# Patient Record
Sex: Female | Born: 1970
Health system: Southern US, Community
[De-identification: ages and names within clinical notes are randomized; demographics above are authoritative.]

## PROBLEM LIST (undated history)

## (undated) DIAGNOSIS — G5600 Carpal tunnel syndrome, unspecified upper limb: Secondary | ICD-10-CM

## (undated) DIAGNOSIS — R7611 Nonspecific reaction to tuberculin skin test without active tuberculosis: Secondary | ICD-10-CM

## (undated) DIAGNOSIS — F419 Anxiety disorder, unspecified: Secondary | ICD-10-CM

## (undated) DIAGNOSIS — E0500 Thyrotoxicosis with diffuse goiter without thyrotoxic crisis or storm: Secondary | ICD-10-CM

## (undated) DIAGNOSIS — I10 Essential (primary) hypertension: Secondary | ICD-10-CM

## (undated) DIAGNOSIS — M5430 Sciatica, unspecified side: Secondary | ICD-10-CM

## (undated) DIAGNOSIS — K219 Gastro-esophageal reflux disease without esophagitis: Secondary | ICD-10-CM

## (undated) DIAGNOSIS — E7800 Pure hypercholesterolemia, unspecified: Secondary | ICD-10-CM

## (undated) DIAGNOSIS — K224 Dyskinesia of esophagus: Secondary | ICD-10-CM

## (undated) DIAGNOSIS — R1310 Dysphagia, unspecified: Secondary | ICD-10-CM

## (undated) DIAGNOSIS — G43909 Migraine, unspecified, not intractable, without status migrainosus: Secondary | ICD-10-CM

## (undated) DIAGNOSIS — J309 Allergic rhinitis, unspecified: Secondary | ICD-10-CM

## (undated) DIAGNOSIS — M069 Rheumatoid arthritis, unspecified: Secondary | ICD-10-CM

## (undated) DIAGNOSIS — Z8719 Personal history of other diseases of the digestive system: Secondary | ICD-10-CM

## (undated) DIAGNOSIS — D649 Anemia, unspecified: Secondary | ICD-10-CM

## (undated) DIAGNOSIS — M1990 Unspecified osteoarthritis, unspecified site: Secondary | ICD-10-CM

## (undated) DIAGNOSIS — G4730 Sleep apnea, unspecified: Secondary | ICD-10-CM

## (undated) HISTORY — DX: Migraine, unspecified, not intractable, without status migrainosus: G43.909

## (undated) HISTORY — PX: CARPAL TUNNEL RELEASE: SHX101

## (undated) HISTORY — DX: Gastro-esophageal reflux disease without esophagitis: K21.9

## (undated) HISTORY — DX: Unspecified osteoarthritis, unspecified site: M19.90

## (undated) HISTORY — PX: TRIGGER FINGER RELEASE: SHX641

## (undated) HISTORY — DX: Nonspecific reaction to tuberculin skin test without active tuberculosis: R76.11

## (undated) HISTORY — DX: Thyrotoxicosis with diffuse goiter without thyrotoxic crisis or storm: E05.00

## (undated) HISTORY — DX: Carpal tunnel syndrome, unspecified upper limb: G56.00

## (undated) HISTORY — DX: Dysphagia, unspecified: R13.10

## (undated) HISTORY — DX: Pure hypercholesterolemia, unspecified: E78.00

## (undated) HISTORY — DX: Essential (primary) hypertension: I10

## (undated) HISTORY — DX: Rheumatoid arthritis, unspecified: M06.9

## (undated) HISTORY — PX: COLONOSCOPY: SHX174

## (undated) HISTORY — PX: ESOPHAGOGASTRODUODENOSCOPY: SHX1529

## (undated) HISTORY — DX: Allergic rhinitis, unspecified: J30.9

## (undated) HISTORY — DX: Anemia, unspecified: D64.9

---

## 1998-03-08 ENCOUNTER — Emergency Department (HOSPITAL_COMMUNITY): Admission: EM | Admit: 1998-03-08 | Discharge: 1998-03-08 | Payer: Self-pay | Admitting: Emergency Medicine

## 2004-10-16 ENCOUNTER — Ambulatory Visit: Payer: Self-pay | Admitting: Internal Medicine

## 2005-06-30 HISTORY — PX: OTHER SURGICAL HISTORY: SHX169

## 2006-02-09 ENCOUNTER — Ambulatory Visit: Payer: Self-pay | Admitting: Unknown Physician Specialty

## 2006-07-26 ENCOUNTER — Emergency Department: Payer: Self-pay | Admitting: Emergency Medicine

## 2006-07-27 ENCOUNTER — Emergency Department: Payer: Self-pay | Admitting: Emergency Medicine

## 2007-03-30 ENCOUNTER — Ambulatory Visit: Payer: Self-pay | Admitting: Internal Medicine

## 2007-05-18 ENCOUNTER — Ambulatory Visit: Payer: Self-pay | Admitting: Internal Medicine

## 2007-08-16 ENCOUNTER — Ambulatory Visit: Payer: Self-pay | Admitting: Unknown Physician Specialty

## 2007-08-16 LAB — HM COLONOSCOPY

## 2008-04-05 ENCOUNTER — Ambulatory Visit: Payer: Self-pay | Admitting: Internal Medicine

## 2008-06-06 ENCOUNTER — Ambulatory Visit: Payer: Self-pay | Admitting: Internal Medicine

## 2008-10-09 ENCOUNTER — Ambulatory Visit: Payer: Self-pay | Admitting: Internal Medicine

## 2009-04-09 ENCOUNTER — Ambulatory Visit: Payer: Self-pay | Admitting: Internal Medicine

## 2009-04-26 ENCOUNTER — Ambulatory Visit: Payer: Self-pay | Admitting: Internal Medicine

## 2009-06-07 ENCOUNTER — Ambulatory Visit: Payer: Self-pay | Admitting: Internal Medicine

## 2010-08-12 ENCOUNTER — Ambulatory Visit: Payer: Self-pay | Admitting: Internal Medicine

## 2010-08-19 ENCOUNTER — Ambulatory Visit: Payer: Self-pay | Admitting: Internal Medicine

## 2011-08-04 LAB — CBC AND DIFFERENTIAL
HCT: 37 % (ref 36–46)
Hemoglobin: 12.3 g/dL (ref 12.0–16.0)
Platelets: 332 10*3/uL (ref 150–399)
WBC: 8.6 10^3/mL

## 2011-08-04 LAB — HM PAP SMEAR

## 2011-08-04 LAB — HEPATIC FUNCTION PANEL: ALT: 18 U/L (ref 7–35)

## 2011-08-14 LAB — LIPID PANEL
Cholesterol: 208 mg/dL — AB (ref 0–200)
HDL: 50 mg/dL (ref 35–70)
LDL Cholesterol: 143 mg/dL
Triglycerides: 79 mg/dL (ref 40–160)

## 2011-08-14 LAB — HEPATIC FUNCTION PANEL
AST: 17 U/L (ref 13–35)
Alkaline Phosphatase: 97 U/L (ref 25–125)
Bilirubin, Total: 0.4 mg/dL

## 2011-08-14 LAB — BASIC METABOLIC PANEL
BUN: 8 mg/dL (ref 4–21)
Creatinine: 0.8 mg/dL (ref <=1.1)
Glucose: 83 mg/dL
Potassium: 4.6 mmol/L (ref 3.4–5.3)

## 2011-10-03 ENCOUNTER — Ambulatory Visit: Payer: Self-pay | Admitting: Internal Medicine

## 2011-10-03 LAB — HM MAMMOGRAPHY

## 2011-10-06 ENCOUNTER — Ambulatory Visit: Payer: Self-pay | Admitting: Internal Medicine

## 2012-04-27 ENCOUNTER — Telehealth: Payer: Self-pay | Admitting: Internal Medicine

## 2012-04-27 DIAGNOSIS — Z0000 Encounter for general adult medical examination without abnormal findings: Secondary | ICD-10-CM

## 2012-04-27 NOTE — Telephone Encounter (Signed)
She can start getting her depo injections here.  She will need to bring her med with her.  She will also need a urine pregnancy test done before getting the injection.  I will order.  This will be done the day she comes in.  Her last injection at [facility] was 01/14/12.

## 2012-04-27 NOTE — Telephone Encounter (Signed)
Pt. Called stating it was time for her depo shot. Is it ok to do it here? Pt. Has appt here on 08/02/12.

## 2012-04-28 NOTE — Telephone Encounter (Signed)
Patient stated that she is overdue and would like to get an appointment for whenever you can fit her in.

## 2012-04-28 NOTE — Telephone Encounter (Signed)
Called pt and scheduled her for this coming Tuesday. I wasn't sure if she should come in now or if you ment wait until her first appointment on 08/02/12

## 2012-04-28 NOTE — Telephone Encounter (Signed)
That is fine to come in Tuesday - for injection.  She will need the urine pregnancy test prior to receiving the injection.  Can be run stat when she gets here and once confirm negative - can give injection.  If any questions let me know.

## 2012-04-28 NOTE — Telephone Encounter (Signed)
Called patient to make sure she had her appointment and information for her next visit.

## 2012-05-04 ENCOUNTER — Ambulatory Visit (INDEPENDENT_AMBULATORY_CARE_PROVIDER_SITE_OTHER): Payer: 59 | Admitting: *Deleted

## 2012-05-04 DIAGNOSIS — Z309 Encounter for contraceptive management, unspecified: Secondary | ICD-10-CM

## 2012-05-04 LAB — POCT URINE PREGNANCY: Preg Test, Ur: NEGATIVE

## 2012-05-04 MED ORDER — MEDROXYPROGESTERONE ACETATE 150 MG/ML IM SUSP
150.0000 mg | Freq: Once | INTRAMUSCULAR | Status: AC
  Administered 2012-05-04: 150 mg via INTRAMUSCULAR

## 2012-06-21 ENCOUNTER — Other Ambulatory Visit: Payer: Self-pay | Admitting: Internal Medicine

## 2012-06-21 MED ORDER — LEVOTHYROXINE SODIUM 75 MCG PO TABS: 75.0000 ug | ORAL_TABLET | Freq: Every day | ORAL | Status: DC

## 2012-06-21 NOTE — Telephone Encounter (Signed)
Levothyroxine 75 mg tablet  Take 1 tablet by mouth once daily  #30

## 2012-06-21 NOTE — Telephone Encounter (Signed)
Sent in to pharmacy.  

## 2012-07-30 ENCOUNTER — Encounter: Payer: Self-pay | Admitting: *Deleted

## 2012-08-02 ENCOUNTER — Telehealth: Payer: Self-pay | Admitting: Internal Medicine

## 2012-08-02 ENCOUNTER — Ambulatory Visit: Payer: Self-pay | Admitting: Internal Medicine

## 2012-08-02 NOTE — Telephone Encounter (Signed)
Okay 

## 2012-08-02 NOTE — Telephone Encounter (Signed)
Patient had to be rescheduled due to the physician being out of the off. Patient is needing her depo injection.

## 2012-08-03 ENCOUNTER — Ambulatory Visit (INDEPENDENT_AMBULATORY_CARE_PROVIDER_SITE_OTHER): Payer: 59 | Admitting: *Deleted

## 2012-08-03 DIAGNOSIS — Z309 Encounter for contraceptive management, unspecified: Secondary | ICD-10-CM

## 2012-08-03 DIAGNOSIS — IMO0001 Reserved for inherently not codable concepts without codable children: Secondary | ICD-10-CM

## 2012-08-03 MED ORDER — METHYLPREDNISOLONE ACETATE 40 MG/ML IJ SUSP
150.0000 mg | Freq: Once | INTRAMUSCULAR | Status: AC
  Administered 2012-08-03: 150 mg via INTRAMUSCULAR

## 2012-08-04 ENCOUNTER — Telehealth: Payer: Self-pay | Admitting: Internal Medicine

## 2012-08-04 NOTE — Telephone Encounter (Signed)
Yes, this should have been Depo- provera.  Thanks.

## 2012-08-04 NOTE — Telephone Encounter (Signed)
Message copied by Charm Barges on Wed Aug 04, 2012 10:15 AM ------      Message from: Zara Council      Created: Wed Aug 04, 2012  8:48 AM      Regarding: Depo Injection       Hi Dr. Lorin Picket; before I sent this charge out I wanted to check with you. For date of service 08/03/2012 the medical record shows the patient coming in for birth control and J1030 Depo-Medrol was selected. Should this have been J1050 Depo-Provera instead? Thanks.

## 2012-09-07 ENCOUNTER — Other Ambulatory Visit (HOSPITAL_COMMUNITY)
Admission: RE | Admit: 2012-09-07 | Discharge: 2012-09-07 | Disposition: A | Discharge status: Home / Self Care | Payer: 59 | Source: Ambulatory Visit | Attending: Internal Medicine | Admitting: Internal Medicine

## 2012-09-07 ENCOUNTER — Encounter: Payer: Self-pay | Admitting: Internal Medicine

## 2012-09-07 ENCOUNTER — Ambulatory Visit (INDEPENDENT_AMBULATORY_CARE_PROVIDER_SITE_OTHER): Payer: 59 | Admitting: Internal Medicine

## 2012-09-07 VITALS — BP 130/80 | HR 78 | Temp 99.0°F | Ht 62.0 in | Wt 159.2 lb

## 2012-09-07 DIAGNOSIS — Z124 Encounter for screening for malignant neoplasm of cervix: Secondary | ICD-10-CM

## 2012-09-07 DIAGNOSIS — Z01419 Encounter for gynecological examination (general) (routine) without abnormal findings: Secondary | ICD-10-CM | POA: Insufficient documentation

## 2012-09-07 DIAGNOSIS — E7800 Pure hypercholesterolemia, unspecified: Secondary | ICD-10-CM

## 2012-09-07 DIAGNOSIS — E039 Hypothyroidism, unspecified: Secondary | ICD-10-CM

## 2012-09-07 DIAGNOSIS — K219 Gastro-esophageal reflux disease without esophagitis: Secondary | ICD-10-CM

## 2012-09-07 DIAGNOSIS — Z800 Family history of malignant neoplasm of digestive organs: Secondary | ICD-10-CM

## 2012-09-07 DIAGNOSIS — I10 Essential (primary) hypertension: Secondary | ICD-10-CM

## 2012-09-07 DIAGNOSIS — I714 Abdominal aortic aneurysm, without rupture, unspecified: Secondary | ICD-10-CM

## 2012-09-07 DIAGNOSIS — Z1211 Encounter for screening for malignant neoplasm of colon: Secondary | ICD-10-CM

## 2012-09-07 DIAGNOSIS — Z1151 Encounter for screening for human papillomavirus (HPV): Secondary | ICD-10-CM | POA: Insufficient documentation

## 2012-09-07 DIAGNOSIS — D649 Anemia, unspecified: Secondary | ICD-10-CM

## 2012-09-07 DIAGNOSIS — Z1239 Encounter for other screening for malignant neoplasm of breast: Secondary | ICD-10-CM

## 2012-09-07 DIAGNOSIS — M069 Rheumatoid arthritis, unspecified: Secondary | ICD-10-CM

## 2012-09-08 ENCOUNTER — Encounter: Payer: Self-pay | Admitting: Internal Medicine

## 2012-09-10 ENCOUNTER — Encounter: Payer: Self-pay | Admitting: Internal Medicine

## 2012-09-13 ENCOUNTER — Other Ambulatory Visit: Payer: 59

## 2012-09-13 ENCOUNTER — Other Ambulatory Visit (INDEPENDENT_AMBULATORY_CARE_PROVIDER_SITE_OTHER): Payer: 59

## 2012-09-13 DIAGNOSIS — E7800 Pure hypercholesterolemia, unspecified: Secondary | ICD-10-CM

## 2012-09-13 DIAGNOSIS — E039 Hypothyroidism, unspecified: Secondary | ICD-10-CM

## 2012-09-13 LAB — LIPID PANEL
Cholesterol: 190 mg/dL (ref 0–200)
HDL: 43.8 mg/dL (ref >39.00)
LDL Cholesterol: 134 mg/dL — ABNORMAL HIGH (ref 0–99)
Total CHOL/HDL Ratio: 4
Triglycerides: 60 mg/dL (ref 0.0–149.0)
VLDL: 12 mg/dL (ref 0.0–40.0)

## 2012-09-13 LAB — TSH: TSH: 2.69 u[IU]/mL (ref 0.35–5.50)

## 2012-09-15 ENCOUNTER — Encounter: Payer: Self-pay | Admitting: Internal Medicine

## 2012-09-15 DIAGNOSIS — I10 Essential (primary) hypertension: Secondary | ICD-10-CM | POA: Insufficient documentation

## 2012-09-15 DIAGNOSIS — K219 Gastro-esophageal reflux disease without esophagitis: Secondary | ICD-10-CM | POA: Insufficient documentation

## 2012-09-15 DIAGNOSIS — Z800 Family history of malignant neoplasm of digestive organs: Secondary | ICD-10-CM | POA: Insufficient documentation

## 2012-09-15 DIAGNOSIS — E039 Hypothyroidism, unspecified: Secondary | ICD-10-CM | POA: Insufficient documentation

## 2012-09-15 DIAGNOSIS — E7800 Pure hypercholesterolemia, unspecified: Secondary | ICD-10-CM | POA: Insufficient documentation

## 2012-09-15 DIAGNOSIS — M069 Rheumatoid arthritis, unspecified: Secondary | ICD-10-CM | POA: Insufficient documentation

## 2012-09-15 DIAGNOSIS — D649 Anemia, unspecified: Secondary | ICD-10-CM | POA: Insufficient documentation

## 2012-09-15 NOTE — Assessment & Plan Note (Signed)
On thyroid replacement.  Check tsh.  

## 2012-09-15 NOTE — Assessment & Plan Note (Signed)
Had wanted to postpone EGD when last evaluated by GI.  Since she is due for a colonoscopy, she is ready to have the EGD as well.  Refer to GI.  Continue Zantac.

## 2012-09-15 NOTE — Assessment & Plan Note (Signed)
Blood pressure is under good control.  Continue same medication regimen.  Check metabolic panel.

## 2012-09-15 NOTE — Assessment & Plan Note (Signed)
Last colonoscopy 2009 - internal hemorrhoids.  Recommended follow up colonoscopy 07/2012.  Due. Refer to GI.

## 2012-09-15 NOTE — Assessment & Plan Note (Signed)
Seeing Dr Gavin Potters.  On Embrel, MTX and prednisone.  Follow.  Stable.

## 2012-09-15 NOTE — Progress Notes (Signed)
Subjective:    Patient ID: Tina Parker, female    DOB: [birth year], 42 y.o.   MRN: [id number]  HPI 42 year old female with past history of hypertension, hypercholesterolemia, GERD and reoccurring allergy and sinus problems.  Recently diagnosed with rheumatoid arthritis.  Followed by Dr Gavin Potters.  She comes in today to follow up on these issues as well as for a complete physical exam.  States she is doing relatively well.   Receiving Embrel now.  On MTX.  Still taking prednisone.  Joints stable.  Some problems with hemorrhoids.  Upper symptoms are better.  Takes zantac.  Had seen GI and they wanted to do an EGD at that time.  She was due a colonoscopy 2/14 and she wanted to wait and have both procedures at the same time.  She elected to postpone the EGD.  No nausea or vomiting.  No abdominal pain.  Breathing stable.     Past Medical History  Diagnosis Date  . Dysphagia   . Pure hypercholesterolemia   . Anemia   . Hypertension   . Graves disease     remission, no ablation, positive medical treatment  . Allergic rhinitis   . 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 Migraine headache   . Carpal tunnel syndrome   . Rheumatoid arthritis     positive anti CCP antibodies, positive RF, oligo-articular, MTX  . GERD (gastroesophageal reflux disease)     Current Outpatient Prescriptions on File Prior to Visit  Medication Sig Dispense Refill  . amLODipine (NORVASC) 10 MG tablet Take 10 mg by mouth daily.      Marland Kitchen azelastine (ASTELIN) 137 MCG/SPRAY nasal spray Place 1 spray into the nose as directed. Use in each nostril as directed      . fexofenadine (ALLEGRA) 180 MG tablet Take 180 mg by mouth daily.      . fluticasone (FLOVENT HFA) 110 MCG/ACT inhaler Inhale 2 puffs into the lungs 2 (two) times daily.      . folic acid (FOLVITE) 1 MG tablet Take 1 mg by mouth daily.      Marland Kitchen ibuprofen (ADVIL,MOTRIN) 800 MG tablet Take 800 mg by mouth 3 (three) times daily as needed. TID PRN for pain      .  levothyroxine (SYNTHROID, LEVOTHROID) 75 MCG tablet Take 1 tablet (75 mcg total) by mouth daily.  30 tablet  1  . losartan-hydrochlorothiazide (HYZAAR) 50-12.5 MG per tablet Take 1 tablet by mouth daily.      . medroxyPROGESTERone (DEPO-PROVERA) 150 MG/ML injection Inject 150 mg into the muscle every 3 (three) months.      . methotrexate (RHEUMATREX) 2.5 MG tablet Take 2.5 mg by mouth once a week. Caution:Chemotherapy. Protect from light. Take 8 tabs po weekly with meals      . montelukast (SINGULAIR) 10 MG tablet Take 10 mg by mouth daily.      . ranitidine (ZANTAC) 150 MG tablet Take 150 mg by mouth 2 (two) times daily.      . cyclobenzaprine (FLEXERIL) 5 MG tablet Take 5 mg by mouth 3 (three) times daily as needed.       No current facility-administered medications on file prior to visit.    Review of Systems Patient denies any headache, lightheadedness or dizziness.  Some sinus and allergy issues intermittently.  On medication.   No chest pain, tightness or palpitations.  No increased shortness of breath, cough or congestion. Takes zantac for acid reflux and her  upper symptoms.  Appears to be swallowing better.  No nausea or vomiting.  No abdominal pain or cramping.  No bowel change, such as diarrhea, constipation, BRBPR or melana.  No urine change.  Receives Depo.      Objective:   Physical Exam Filed Vitals:   09/07/12 1339  BP: 130/80  Pulse: 78  Temp: 99 F (37.2 C)   Blood pressure recheck:  126/76, pulse 29  42 year old female in no acute distress.   HEENT:  Nares- clear.  Oropharynx - without lesions. NECK:  Supple.  Nontender.  No audible bruit.  HEART:  Appears to be regular. LUNGS:  No crackles or wheezing audible.  Respirations even and unlabored.  RADIAL PULSE:  Equal bilaterally.    BREASTS:  No nipple discharge or nipple retraction present.  Could not appreciate any distinct nodules or axillary adenopathy.  ABDOMEN:  Soft, nontender.  Bowel sounds present and  normal.  No audible abdominal bruit.  GU:  Normal external genitalia.  Vaginal vault without lesions.  Cervix identified.  Pap performed. Could not appreciate any adnexal masses or tenderness.   RECTAL:  Heme negative.   EXTREMITIES:  No increased edema present.  DP pulses palpable and equal bilaterally.          Assessment & Plan:  CARDIOVASCULAR.  ECHO 01/27/11 revealed EF 60%, mild mitral and tricuspid insufficiency.  Currently asymptomatic.    GYN.  Depo.  Follow.    HEALTH MAINTENANCE.  Physical today.  Mammogram 10/03/11 - BiRADS II.  Colonoscopy 08/16/07 - internal hemorrhoids.  Recommended follow up colonoscopy 07/2012.

## 2012-09-15 NOTE — Assessment & Plan Note (Signed)
Low cholesterol diet and exercise.  Check lipid panel.   

## 2012-09-15 NOTE — Assessment & Plan Note (Signed)
Check cbc to confirm stable.   

## 2012-10-11 ENCOUNTER — Ambulatory Visit: Payer: Self-pay | Admitting: Internal Medicine

## 2012-10-19 ENCOUNTER — Ambulatory Visit (INDEPENDENT_AMBULATORY_CARE_PROVIDER_SITE_OTHER): Payer: 59 | Admitting: *Deleted

## 2012-10-19 DIAGNOSIS — IMO0001 Reserved for inherently not codable concepts without codable children: Secondary | ICD-10-CM

## 2012-10-19 DIAGNOSIS — Z309 Encounter for contraceptive management, unspecified: Secondary | ICD-10-CM

## 2012-10-19 MED ORDER — MEDROXYPROGESTERONE ACETATE 150 MG/ML IM SUSP
150.0000 mg | Freq: Once | INTRAMUSCULAR | Status: AC
  Administered 2012-10-19: 150 mg via INTRAMUSCULAR

## 2012-10-20 ENCOUNTER — Other Ambulatory Visit: Payer: Self-pay | Admitting: *Deleted

## 2012-10-21 MED ORDER — AMLODIPINE BESYLATE 10 MG PO TABS: 10.0000 mg | ORAL_TABLET | Freq: Every day | ORAL | Status: DC

## 2012-11-05 ENCOUNTER — Encounter: Payer: Self-pay | Admitting: Internal Medicine

## 2013-01-11 ENCOUNTER — Ambulatory Visit (INDEPENDENT_AMBULATORY_CARE_PROVIDER_SITE_OTHER): Payer: 59 | Admitting: *Deleted

## 2013-01-11 ENCOUNTER — Telehealth: Payer: Self-pay | Admitting: *Deleted

## 2013-01-11 DIAGNOSIS — Z309 Encounter for contraceptive management, unspecified: Secondary | ICD-10-CM

## 2013-01-11 MED ORDER — MEDROXYPROGESTERONE ACETATE 150 MG/ML IM SUSP
150.0000 mg | Freq: Once | INTRAMUSCULAR | Status: AC
  Administered 2013-01-11: 150 mg via INTRAMUSCULAR

## 2013-01-11 NOTE — Telephone Encounter (Signed)
Dr. Cyndie Mull office requesting last ov notes. Notes sent.

## 2013-01-17 ENCOUNTER — Ambulatory Visit: Payer: 59 | Admitting: Internal Medicine

## 2013-02-14 ENCOUNTER — Other Ambulatory Visit: Payer: Self-pay | Admitting: *Deleted

## 2013-02-15 MED ORDER — LOSARTAN POTASSIUM-HCTZ 50-12.5 MG PO TABS: 1.0000 | ORAL_TABLET | Freq: Every day | ORAL | Status: DC

## 2013-03-07 ENCOUNTER — Ambulatory Visit: Payer: Self-pay | Admitting: Unknown Physician Specialty

## 2013-03-08 LAB — PATHOLOGY REPORT

## 2013-03-18 ENCOUNTER — Encounter: Payer: Self-pay | Admitting: Internal Medicine

## 2013-03-18 DIAGNOSIS — K219 Gastro-esophageal reflux disease without esophagitis: Secondary | ICD-10-CM

## 2013-03-18 DIAGNOSIS — Z800 Family history of malignant neoplasm of digestive organs: Secondary | ICD-10-CM

## 2013-04-11 ENCOUNTER — Ambulatory Visit (INDEPENDENT_AMBULATORY_CARE_PROVIDER_SITE_OTHER): Payer: 59 | Admitting: *Deleted

## 2013-04-11 DIAGNOSIS — Z309 Encounter for contraceptive management, unspecified: Secondary | ICD-10-CM

## 2013-04-11 DIAGNOSIS — Z23 Encounter for immunization: Secondary | ICD-10-CM

## 2013-04-11 MED ORDER — MEDROXYPROGESTERONE ACETATE 150 MG/ML IM SUSP
150.0000 mg | Freq: Once | INTRAMUSCULAR | Status: AC
  Administered 2013-04-11: 150 mg via INTRAMUSCULAR

## 2013-04-12 ENCOUNTER — Encounter: Payer: Self-pay | Admitting: Internal Medicine

## 2013-04-13 ENCOUNTER — Encounter: Payer: Self-pay | Admitting: Internal Medicine

## 2013-04-26 ENCOUNTER — Ambulatory Visit (INDEPENDENT_AMBULATORY_CARE_PROVIDER_SITE_OTHER): Payer: 59 | Admitting: Internal Medicine

## 2013-04-26 ENCOUNTER — Encounter: Payer: Self-pay | Admitting: Internal Medicine

## 2013-04-26 VITALS — BP 110/80 | HR 91 | Temp 98.3°F | Ht 62.0 in | Wt 170.2 lb

## 2013-04-26 DIAGNOSIS — D649 Anemia, unspecified: Secondary | ICD-10-CM

## 2013-04-26 DIAGNOSIS — K219 Gastro-esophageal reflux disease without esophagitis: Secondary | ICD-10-CM

## 2013-04-26 DIAGNOSIS — M069 Rheumatoid arthritis, unspecified: Secondary | ICD-10-CM

## 2013-04-26 DIAGNOSIS — I10 Essential (primary) hypertension: Secondary | ICD-10-CM

## 2013-04-26 DIAGNOSIS — Z800 Family history of malignant neoplasm of digestive organs: Secondary | ICD-10-CM

## 2013-04-26 DIAGNOSIS — E7800 Pure hypercholesterolemia, unspecified: Secondary | ICD-10-CM

## 2013-04-26 DIAGNOSIS — E039 Hypothyroidism, unspecified: Secondary | ICD-10-CM

## 2013-04-26 LAB — LIPID PANEL
Cholesterol: 196 mg/dL (ref 0–200)
HDL: 43.5 mg/dL (ref >39.00)
LDL Cholesterol: 139 mg/dL — ABNORMAL HIGH (ref 0–99)
Total CHOL/HDL Ratio: 5
Triglycerides: 69 mg/dL (ref 0.0–149.0)
VLDL: 13.8 mg/dL (ref 0.0–40.0)

## 2013-04-26 LAB — COMPREHENSIVE METABOLIC PANEL
ALT: 21 U/L (ref 0–35)
AST: 23 U/L (ref 0–37)
Albumin: 4.1 g/dL (ref 3.5–5.2)
Alkaline Phosphatase: 87 U/L (ref 39–117)
BUN: 16 mg/dL (ref 6–23)
CO2: 23 mEq/L (ref 19–32)
Calcium: 9.6 mg/dL (ref 8.4–10.5)
Chloride: 105 mEq/L (ref 96–112)
Creatinine, Ser: 1 mg/dL (ref 0.4–1.2)
GFR: 81.16 mL/min (ref >60.00)
Glucose, Bld: 91 mg/dL (ref 70–99)
Potassium: 4.3 mEq/L (ref 3.5–5.1)
Sodium: 136 mEq/L (ref 135–145)
Total Bilirubin: 0.9 mg/dL (ref 0.3–1.2)
Total Protein: 8.4 g/dL — ABNORMAL HIGH (ref 6.0–8.3)

## 2013-04-26 LAB — TSH: TSH: 2.86 u[IU]/mL (ref 0.35–5.50)

## 2013-04-27 ENCOUNTER — Encounter: Payer: Self-pay | Admitting: *Deleted

## 2013-04-27 ENCOUNTER — Other Ambulatory Visit: Payer: Self-pay | Admitting: Internal Medicine

## 2013-04-27 DIAGNOSIS — E8809 Other disorders of plasma-protein metabolism, not elsewhere classified: Secondary | ICD-10-CM

## 2013-04-27 NOTE — Progress Notes (Signed)
Order placed for f/u protein.

## 2013-05-01 ENCOUNTER — Encounter: Payer: Self-pay | Admitting: Internal Medicine

## 2013-05-01 NOTE — Assessment & Plan Note (Signed)
On thyroid replacement.  Follow tsh.  

## 2013-05-01 NOTE — Assessment & Plan Note (Signed)
Follow cbc to confirm stable.  

## 2013-05-01 NOTE — Progress Notes (Signed)
Subjective:    Patient ID: Tina Parker, female    DOB: 20-Jul-1970, 42 y.o.   MRN: [id number]  HPI 42 year old female with past history of hypertension, hypercholesterolemia, GERD and reoccurring allergy and sinus problems.  Recently diagnosed with rheumatoid arthritis.  Followed by Dr Gavin Potters.  She comes in today for a scheduled follow up.   States she is doing relatively well.   Receiving Embrel now.  On MTX.  Having increased pain in her right fifth finger and in her hip.  Taking an increased amount of ibuprofen.  Is not scheduled to see Dr Gavin Potters until 12/14.  States she feels her upper symptoms/swallowing are worse since her EGD.  Taking two zantac in the morning and 40mg  of prilosec before her evening meal.  Sees Dr  Markham Jordan.  Plans to f/u with him regarding this.  We did discuss the need to decrease the ibuprofen.  No nausea or vomiting.  No abdominal pain.  Breathing stable.  Some allergy issues.  Discussed using saline and allegra.  Some constipation.     Past Medical History  Diagnosis Date  . Dysphagia   . Pure hypercholesterolemia   . Anemia   . Hypertension   . Graves disease     remission, no ablation, positive medical treatment  . Allergic rhinitis   . 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 Migraine headache   . Carpal tunnel syndrome   . Rheumatoid arthritis(714.0)     positive anti CCP antibodies, positive RF, oligo-articular, MTX  . GERD (gastroesophageal reflux disease)     Current Outpatient Prescriptions on File Prior to Visit  Medication Sig Dispense Refill  . amLODipine (NORVASC) 10 MG tablet Take 1 tablet (10 mg total) by mouth daily.  30 tablet  5  . azelastine (ASTELIN) 137 MCG/SPRAY nasal spray Place 1 spray into the nose as directed. Use in each nostril as directed      . fexofenadine (ALLEGRA) 180 MG tablet Take 180 mg by mouth daily.      . fluticasone (FLOVENT HFA) 110 MCG/ACT inhaler Inhale 2 puffs into the lungs 2 (two) times daily.      .  folic acid (FOLVITE) 1 MG tablet Take 1 mg by mouth daily.      Marland Kitchen ibuprofen (ADVIL,MOTRIN) 800 MG tablet Take 800 mg by mouth 3 (three) times daily as needed. TID PRN for pain      . levothyroxine (SYNTHROID, LEVOTHROID) 75 MCG tablet Take 1 tablet (75 mcg total) by mouth daily.  30 tablet  1  . losartan-hydrochlorothiazide (HYZAAR) 50-12.5 MG per tablet Take 1 tablet by mouth daily.  30 tablet  2  . medroxyPROGESTERone (DEPO-PROVERA) 150 MG/ML injection Inject 150 mg into the muscle every 3 (three) months.      . methotrexate (RHEUMATREX) 2.5 MG tablet Take 2.5 mg by mouth once a week. Caution:Chemotherapy. Protect from light. Take 8 tabs po weekly with meals      . montelukast (SINGULAIR) 10 MG tablet Take 10 mg by mouth daily.      . ranitidine (ZANTAC) 150 MG tablet Take 150 mg by mouth 2 (two) times daily.       No current facility-administered medications on file prior to visit.    Review of Systems Patient denies any headache, lightheadedness or dizziness.  Some sinus and allergy issues intermittently.  On medication.   No chest pain, tightness or palpitations.  No increased shortness of breath, cough or congestion.  Takes zantac and prilosec as outlined.  Symptoms worsened since her EGD.   No nausea or vomiting.  No abdominal pain or cramping.  No bowel change, such as diarrhea, BRBPR or melana.  Some minimal constipation.  No urine change.  Receives Depo.  Joint pains as outlined.       Objective:   Physical Exam  Filed Vitals:   04/26/13 0822  BP: 110/80  Pulse: 91  Temp: 98.3 F (36.8 C)   Blood pressure recheck:  40/70  42 year old female in no acute distress.   HEENT:  Nares- clear except for erythematous turbinates.   Oropharynx - without lesions. NECK:  Supple.  Nontender.  No audible bruit.  HEART:  Appears to be regular. LUNGS:  No crackles or wheezing audible.  Respirations even and unlabored.  RADIAL PULSE:  Equal bilaterally.    ABDOMEN:  Soft, nontender.  Bowel  sounds present and normal.  No audible abdominal bruit.  EXTREMITIES:  No increased edema present.  DP pulses palpable and equal bilaterally.          Assessment & Plan:  CARDIOVASCULAR.  ECHO 01/27/11 revealed EF 60%, mild mitral and tricuspid insufficiency.  Currently asymptomatic.    GYN.  Depo.  Follow.    HEALTH MAINTENANCE.  Physical 09/07/12.   Mammogram 10/11/12 - BiRADS II.  Colonoscopy 08/16/07 - internal hemorrhoids.  Recommended follow up colonoscopy 07/2012.  Just had colonoscopy - ok.

## 2013-05-01 NOTE — Assessment & Plan Note (Signed)
Seeing Dr Gavin Potters.  On Embrel and MTX.  Increased pain in her fingers and in her hip.  May need injection in her hip.  Taking and increased amount of ibuprofen.  Discussed the need to quit/cut down.  Will refer back to Dr Gavin Potters for further w/up and treatment.  May need medication dosage adjustment.  Question if would benefit from hip injection.

## 2013-05-01 NOTE — Assessment & Plan Note (Signed)
Symptoms worsened after EGD.  Decrease/stop ibuprofen.  Continues on prilosec and zantac.  F/u with Dr Markham Jordan.

## 2013-05-01 NOTE — Assessment & Plan Note (Signed)
Colonoscopy.  Some constipation.  Stay hydrated.  miralax as directed.

## 2013-05-01 NOTE — Assessment & Plan Note (Signed)
Blood pressure is under good control.  Continue same medication regimen.  Follow  metabolic panel.  

## 2013-05-01 NOTE — Assessment & Plan Note (Signed)
Low cholesterol diet and exercise.  Follow lipid panel.   

## 2013-05-02 ENCOUNTER — Encounter: Payer: Self-pay | Admitting: Internal Medicine

## 2013-05-02 DIAGNOSIS — K219 Gastro-esophageal reflux disease without esophagitis: Secondary | ICD-10-CM

## 2013-05-09 ENCOUNTER — Other Ambulatory Visit (INDEPENDENT_AMBULATORY_CARE_PROVIDER_SITE_OTHER): Payer: 59

## 2013-05-09 DIAGNOSIS — E8809 Other disorders of plasma-protein metabolism, not elsewhere classified: Secondary | ICD-10-CM

## 2013-05-09 LAB — PROTEIN, TOTAL: Total Protein: 7 g/dL (ref 6.0–8.3)

## 2013-05-16 ENCOUNTER — Encounter: Payer: Self-pay | Admitting: Internal Medicine

## 2013-07-05 ENCOUNTER — Ambulatory Visit: Payer: 59 | Admitting: Internal Medicine

## 2013-08-01 ENCOUNTER — Ambulatory Visit: Payer: 59 | Admitting: Internal Medicine

## 2013-08-15 ENCOUNTER — Ambulatory Visit (INDEPENDENT_AMBULATORY_CARE_PROVIDER_SITE_OTHER): Payer: 59 | Admitting: *Deleted

## 2013-08-15 ENCOUNTER — Encounter (INDEPENDENT_AMBULATORY_CARE_PROVIDER_SITE_OTHER): Payer: Self-pay

## 2013-08-15 DIAGNOSIS — Z3042 Encounter for surveillance of injectable contraceptive: Secondary | ICD-10-CM

## 2013-08-15 DIAGNOSIS — Z3049 Encounter for surveillance of other contraceptives: Secondary | ICD-10-CM

## 2013-08-15 LAB — POCT URINE PREGNANCY: Preg Test, Ur: NEGATIVE

## 2013-08-15 MED ORDER — MEDROXYPROGESTERONE ACETATE 150 MG/ML IM SUSP
150.0000 mg | Freq: Once | INTRAMUSCULAR | Status: AC
  Administered 2013-08-15: 150 mg via INTRAMUSCULAR

## 2013-08-24 ENCOUNTER — Ambulatory Visit: Payer: 59 | Admitting: Internal Medicine

## 2013-10-18 ENCOUNTER — Ambulatory Visit (INDEPENDENT_AMBULATORY_CARE_PROVIDER_SITE_OTHER): Payer: 59 | Admitting: Internal Medicine

## 2013-10-18 ENCOUNTER — Encounter: Payer: Self-pay | Admitting: Internal Medicine

## 2013-10-18 VITALS — BP 110/70 | HR 74 | Temp 98.5°F | Ht 62.0 in | Wt 171.0 lb

## 2013-10-18 DIAGNOSIS — Z800 Family history of malignant neoplasm of digestive organs: Secondary | ICD-10-CM

## 2013-10-18 DIAGNOSIS — E7800 Pure hypercholesterolemia, unspecified: Secondary | ICD-10-CM

## 2013-10-18 DIAGNOSIS — K219 Gastro-esophageal reflux disease without esophagitis: Secondary | ICD-10-CM

## 2013-10-18 DIAGNOSIS — E039 Hypothyroidism, unspecified: Secondary | ICD-10-CM

## 2013-10-18 DIAGNOSIS — M069 Rheumatoid arthritis, unspecified: Secondary | ICD-10-CM

## 2013-10-18 DIAGNOSIS — D649 Anemia, unspecified: Secondary | ICD-10-CM

## 2013-10-18 DIAGNOSIS — Z803 Family history of malignant neoplasm of breast: Secondary | ICD-10-CM

## 2013-10-18 DIAGNOSIS — Z9109 Other allergy status, other than to drugs and biological substances: Secondary | ICD-10-CM

## 2013-10-18 DIAGNOSIS — Z1239 Encounter for other screening for malignant neoplasm of breast: Secondary | ICD-10-CM

## 2013-10-18 DIAGNOSIS — I10 Essential (primary) hypertension: Secondary | ICD-10-CM

## 2013-10-18 LAB — CBC WITH DIFFERENTIAL/PLATELET
Basophils Absolute: 0 10*3/uL (ref 0.0–0.1)
Basophils Relative: 0.4 % (ref 0.0–3.0)
Eosinophils Absolute: 0 10*3/uL (ref 0.0–0.7)
Eosinophils Relative: 0.4 % (ref 0.0–5.0)
HCT: 36.8 % (ref 36.0–46.0)
Hemoglobin: 12.3 g/dL (ref 12.0–15.0)
Lymphocytes Relative: 19.1 % (ref 12.0–46.0)
Lymphs Abs: 1.6 10*3/uL (ref 0.7–4.0)
MCHC: 33.5 g/dL (ref 30.0–36.0)
MCV: 94.4 fl (ref 78.0–100.0)
Monocytes Absolute: 0.8 10*3/uL (ref 0.1–1.0)
Monocytes Relative: 9.1 % (ref 3.0–12.0)
Neutro Abs: 6.1 10*3/uL (ref 1.4–7.7)
Neutrophils Relative %: 71 % (ref 43.0–77.0)
Platelets: 299 10*3/uL (ref 150.0–400.0)
RBC: 3.9 Mil/uL (ref 3.87–5.11)
RDW: 14.6 % (ref 11.5–14.6)
WBC: 8.5 10*3/uL (ref 4.5–10.5)

## 2013-10-18 LAB — COMPREHENSIVE METABOLIC PANEL
ALT: 15 U/L (ref 0–35)
AST: 17 U/L (ref 0–37)
Albumin: 3.7 g/dL (ref 3.5–5.2)
Alkaline Phosphatase: 74 U/L (ref 39–117)
BUN: 11 mg/dL (ref 6–23)
CO2: 25 mEq/L (ref 19–32)
Calcium: 9.3 mg/dL (ref 8.4–10.5)
Chloride: 105 mEq/L (ref 96–112)
Creatinine, Ser: 0.9 mg/dL (ref 0.4–1.2)
GFR: 93.19 mL/min (ref >60.00)
Glucose, Bld: 86 mg/dL (ref 70–99)
Potassium: 3.8 mEq/L (ref 3.5–5.1)
Sodium: 138 mEq/L (ref 135–145)
Total Bilirubin: 0.7 mg/dL (ref 0.3–1.2)
Total Protein: 7.7 g/dL (ref 6.0–8.3)

## 2013-10-18 LAB — LIPID PANEL
Cholesterol: 176 mg/dL (ref 0–200)
HDL: 46.6 mg/dL (ref >39.00)
LDL Cholesterol: 116 mg/dL — ABNORMAL HIGH (ref 0–99)
Total CHOL/HDL Ratio: 4
Triglycerides: 67 mg/dL (ref 0.0–149.0)
VLDL: 13.4 mg/dL (ref 0.0–40.0)

## 2013-10-18 MED ORDER — LOSARTAN POTASSIUM-HCTZ 50-12.5 MG PO TABS: 1.0000 | ORAL_TABLET | Freq: Every day | ORAL | Status: DC

## 2013-10-18 MED ORDER — ALBUTEROL SULFATE HFA 108 (90 BASE) MCG/ACT IN AERS: 2.0000 | INHALATION_SPRAY | Freq: Four times a day (QID) | RESPIRATORY_TRACT | Status: DC | PRN

## 2013-10-18 MED ORDER — AMLODIPINE BESYLATE 10 MG PO TABS: 10.0000 mg | ORAL_TABLET | Freq: Every day | ORAL | Status: DC

## 2013-10-18 MED ORDER — MONTELUKAST SODIUM 10 MG PO TABS: 10.0000 mg | ORAL_TABLET | Freq: Every day | ORAL | Status: DC

## 2013-10-18 NOTE — Progress Notes (Signed)
Subjective:    Patient ID: Tina Parker, female    DOB: [birth year], 43 y.o.   MRN: [id number]  HPI 43 year old female with past history of hypertension, hypercholesterolemia, GERD and reoccurring allergy and sinus problems.  Recently diagnosed with rheumatoid arthritis.  Followed by Dr Jefm Bryant.  She comes in today for a scheduled follow up.   States she is doing relatively well.   On MTX.  Needs to restart singulair.  Taking allegra.  Using her inhalers if needed.  Still with allergy issues.  Has been having some intermittent problems with whelps and rash.  Previously on prednisone.  Helped some.   No nausea or vomiting.  No abdominal pain.  Does feel her "hernia is protrucing".  Taking zantac.  Feels this works better than PPI.  Breathing stable.  No increased cough and congestion.     Past Medical History  Diagnosis Date  . Dysphagia   . Pure hypercholesterolemia   . Anemia   . Hypertension   . Graves disease     remission, no ablation, positive medical treatment  . Allergic rhinitis   . PPD positive     hepatitis secondary to [place]  . Migraine headache   . Carpal tunnel syndrome   . Rheumatoid arthritis(714.0)     positive anti CCP antibodies, positive RF, oligo-articular, MTX  . GERD (gastroesophageal reflux disease)     Current Outpatient Prescriptions on File Prior to Visit  Medication Sig Dispense Refill  . amLODipine (NORVASC) 10 MG tablet Take 1 tablet (10 mg total) by mouth daily.  30 tablet  5  . fexofenadine (ALLEGRA) 180 MG tablet Take 180 mg by mouth daily.      . folic acid (FOLVITE) 1 MG tablet Take 1 mg by mouth daily.      Marland Kitchen ibuprofen (ADVIL,MOTRIN) 800 MG tablet Take 800 mg by mouth 3 (three) times daily as needed. TID PRN for pain      . losartan-hydrochlorothiazide (HYZAAR) 50-12.5 MG per tablet Take 1 tablet by mouth daily.  30 tablet  2  . medroxyPROGESTERone (DEPO-PROVERA) 150 MG/ML injection Inject 150 mg into the muscle every 3 (three) months.      .  methotrexate (RHEUMATREX) 2.5 MG tablet Take 2.5 mg by mouth once a week. Caution:Chemotherapy. Protect from light. Take 8 tabs po weekly with meals      . ranitidine (ZANTAC) 150 MG tablet Take 150 mg by mouth 2 (two) times daily.       No current facility-administered medications on file prior to visit.    Review of Systems Patient denies any headache, lightheadedness or dizziness.  Some sinus and allergy issues intermittently.  On medication.  Persistent despite medications.  Rash and whelps as outlined.   No chest pain, tightness or palpitations.  No increased shortness of breath, cough or congestion. Takes zantac.  Has issues with feeling as if her hernia is protruding.  No nausea or vomiting.  No abdominal pain or cramping.  No bowel change, such as diarrhea, BRBPR or melana.   No urine change.  Receives Depo.       Objective:   Physical Exam  Filed Vitals:   10/18/13 0855  BP: 110/70  Pulse: 74  Temp: 98.5 F (36.9 C)   Blood pressure recheck:  120/76, pulse 68  43 year old female in no acute distress.   HEENT:  Nares- clear except for erythematous turbinates.   Oropharynx - without lesions. NECK:  Supple.  Nontender.  No  audible bruit.  HEART:  Appears to be regular. LUNGS:  No crackles or wheezing audible.  Respirations even and unlabored.  RADIAL PULSE:  Equal bilaterally.    ABDOMEN:  Soft, nontender.  Bowel sounds present and normal.  No audible abdominal bruit.  EXTREMITIES:  No increased edema present.  DP pulses palpable and equal bilaterally.          Assessment & Plan:  CARDIOVASCULAR.  ECHO 01/27/11 revealed EF 60%, mild mitral and tricuspid insufficiency.  Currently asymptomatic.    GYN.  Depo.  Follow.    HEALTH MAINTENANCE.   Mammogram 10/11/12 - BiRADS II.  Mother with breast cancer.  Has dense breasts.  Schedule her for a 3 D mammogram.  Colonoscopy 08/16/07 - internal hemorrhoids.  Recommended follow up colonoscopy 07/2012.  Colonoscopy 03/07/13 revealed a  rectal polyp and internal hemorrhoids.

## 2013-10-18 NOTE — Progress Notes (Signed)
Pre visit review using our clinic review tool, if applicable. No additional management support is needed unless otherwise documented below in the visit note. 

## 2013-10-19 ENCOUNTER — Encounter: Payer: Self-pay | Admitting: *Deleted

## 2013-10-22 ENCOUNTER — Encounter: Payer: Self-pay | Admitting: Internal Medicine

## 2013-10-22 DIAGNOSIS — Z803 Family history of malignant neoplasm of breast: Secondary | ICD-10-CM | POA: Insufficient documentation

## 2013-10-22 DIAGNOSIS — Z9109 Other allergy status, other than to drugs and biological substances: Secondary | ICD-10-CM | POA: Insufficient documentation

## 2013-10-22 NOTE — Assessment & Plan Note (Signed)
Symptoms as outlined.  On zantac.  Feels this works better for her than PPI.  Given persistent problems, will have GI reevaluate.

## 2013-10-22 NOTE — Assessment & Plan Note (Signed)
Seeing Dr Kernodle.  On Embrel and MTX.  Stable.    

## 2013-10-22 NOTE — Assessment & Plan Note (Signed)
On thyroid replacement.  Follow tsh.  

## 2013-10-22 NOTE — Assessment & Plan Note (Signed)
Allergy issues as outlined.  Persistent problems as outlined despite medications.  Refer to an allergist for further evaluation and treatment recommendations.

## 2013-10-22 NOTE — Assessment & Plan Note (Signed)
Low cholesterol diet and exercise.  Follow lipid panel.   

## 2013-10-22 NOTE — Assessment & Plan Note (Signed)
Blood pressure is under good control.  Continue same medication regimen.  Follow  metabolic panel.  

## 2013-10-22 NOTE — Assessment & Plan Note (Signed)
Follow cbc to confirm stable.  

## 2013-10-22 NOTE — Assessment & Plan Note (Signed)
Mother with breast cancer.  Has dense breasts.  Schedule a 3 D mammogram.

## 2013-10-22 NOTE — Assessment & Plan Note (Signed)
Colonoscopy as outlined.  Follow.  

## 2013-10-31 ENCOUNTER — Ambulatory Visit: Payer: 59

## 2013-11-03 ENCOUNTER — Ambulatory Visit: Payer: 59

## 2013-11-14 ENCOUNTER — Ambulatory Visit
Admission: RE | Admit: 2013-11-14 | Discharge: 2013-11-14 | Disposition: A | Discharge status: Home / Self Care | Payer: 59 | Source: Ambulatory Visit | Attending: Internal Medicine | Admitting: Internal Medicine

## 2013-11-14 ENCOUNTER — Ambulatory Visit (INDEPENDENT_AMBULATORY_CARE_PROVIDER_SITE_OTHER): Payer: 59 | Admitting: *Deleted

## 2013-11-14 DIAGNOSIS — Z3042 Encounter for surveillance of injectable contraceptive: Secondary | ICD-10-CM

## 2013-11-14 DIAGNOSIS — Z1239 Encounter for other screening for malignant neoplasm of breast: Secondary | ICD-10-CM

## 2013-11-14 DIAGNOSIS — Z3049 Encounter for surveillance of other contraceptives: Secondary | ICD-10-CM

## 2013-11-14 MED ORDER — MEDROXYPROGESTERONE ACETATE 150 MG/ML IM SUSP
150.0000 mg | Freq: Once | INTRAMUSCULAR | Status: AC
  Administered 2013-11-14: 150 mg via INTRAMUSCULAR

## 2013-12-05 ENCOUNTER — Other Ambulatory Visit (HOSPITAL_COMMUNITY)
Admission: RE | Admit: 2013-12-05 | Discharge: 2013-12-05 | Disposition: A | Discharge status: Home / Self Care | Payer: 59 | Source: Ambulatory Visit | Attending: Internal Medicine | Admitting: Internal Medicine

## 2013-12-05 ENCOUNTER — Encounter: Payer: Self-pay | Admitting: Internal Medicine

## 2013-12-05 ENCOUNTER — Ambulatory Visit (INDEPENDENT_AMBULATORY_CARE_PROVIDER_SITE_OTHER): Payer: 59 | Admitting: Internal Medicine

## 2013-12-05 VITALS — BP 110/70 | HR 100 | Temp 98.5°F | Ht 62.0 in | Wt 174.5 lb

## 2013-12-05 DIAGNOSIS — Z01419 Encounter for gynecological examination (general) (routine) without abnormal findings: Secondary | ICD-10-CM | POA: Insufficient documentation

## 2013-12-05 DIAGNOSIS — Z803 Family history of malignant neoplasm of breast: Secondary | ICD-10-CM

## 2013-12-05 DIAGNOSIS — Z9109 Other allergy status, other than to drugs and biological substances: Secondary | ICD-10-CM

## 2013-12-05 DIAGNOSIS — Z1151 Encounter for screening for human papillomavirus (HPV): Secondary | ICD-10-CM | POA: Insufficient documentation

## 2013-12-05 DIAGNOSIS — E039 Hypothyroidism, unspecified: Secondary | ICD-10-CM

## 2013-12-05 DIAGNOSIS — L989 Disorder of the skin and subcutaneous tissue, unspecified: Secondary | ICD-10-CM

## 2013-12-05 DIAGNOSIS — Z124 Encounter for screening for malignant neoplasm of cervix: Secondary | ICD-10-CM

## 2013-12-05 DIAGNOSIS — E7800 Pure hypercholesterolemia, unspecified: Secondary | ICD-10-CM

## 2013-12-05 DIAGNOSIS — K219 Gastro-esophageal reflux disease without esophagitis: Secondary | ICD-10-CM

## 2013-12-05 DIAGNOSIS — Z800 Family history of malignant neoplasm of digestive organs: Secondary | ICD-10-CM

## 2013-12-05 DIAGNOSIS — E669 Obesity, unspecified: Secondary | ICD-10-CM

## 2013-12-05 DIAGNOSIS — D649 Anemia, unspecified: Secondary | ICD-10-CM

## 2013-12-05 DIAGNOSIS — I10 Essential (primary) hypertension: Secondary | ICD-10-CM

## 2013-12-05 DIAGNOSIS — M069 Rheumatoid arthritis, unspecified: Secondary | ICD-10-CM

## 2013-12-05 MED ORDER — PANTOPRAZOLE SODIUM 40 MG PO TBEC: 40.0000 mg | DELAYED_RELEASE_TABLET | Freq: Every day | ORAL | Status: DC

## 2013-12-05 NOTE — Patient Instructions (Signed)
Take protonix in the morning - 30 minutes before breakfast.   Zantac in the evening.

## 2013-12-05 NOTE — Progress Notes (Signed)
Pre visit review using our clinic review tool, if applicable. No additional management support is needed unless otherwise documented below in the visit note. 

## 2013-12-05 NOTE — Assessment & Plan Note (Addendum)
Symptoms as outlined.  On zantac.  Start protonix q day.   Given persistent problems, will have GI reevaluate.  Scheduled to see GI.

## 2013-12-06 LAB — CYTOLOGY - PAP

## 2013-12-07 ENCOUNTER — Encounter: Payer: Self-pay | Admitting: *Deleted

## 2013-12-11 ENCOUNTER — Encounter: Payer: Self-pay | Admitting: Internal Medicine

## 2013-12-11 DIAGNOSIS — Z6834 Body mass index (BMI) 34.0-34.9, adult: Secondary | ICD-10-CM | POA: Insufficient documentation

## 2013-12-11 DIAGNOSIS — Z6832 Body mass index (BMI) 32.0-32.9, adult: Secondary | ICD-10-CM | POA: Insufficient documentation

## 2013-12-11 DIAGNOSIS — L989 Disorder of the skin and subcutaneous tissue, unspecified: Secondary | ICD-10-CM | POA: Insufficient documentation

## 2013-12-11 NOTE — Assessment & Plan Note (Signed)
Lotrimin cream as directed.  Follow.

## 2013-12-11 NOTE — Assessment & Plan Note (Signed)
Colonoscopy as outlined.  Follow.  

## 2013-12-11 NOTE — Assessment & Plan Note (Signed)
Low cholesterol diet and exercise.  Follow lipid panel.   

## 2013-12-11 NOTE — Assessment & Plan Note (Signed)
Mammogram 10/18/13 - Birads I.

## 2013-12-11 NOTE — Assessment & Plan Note (Signed)
Diet, exercise and weight loss.  

## 2013-12-11 NOTE — Assessment & Plan Note (Signed)
On thyroid replacement.  Follow tsh.  

## 2013-12-11 NOTE — Assessment & Plan Note (Signed)
Blood pressure is under good control.  Continue same medication regimen.  Follow  metabolic panel.

## 2013-12-11 NOTE — Assessment & Plan Note (Signed)
Seeing Dr Jefm Bryant.  On Embrel and MTX.  Stable.

## 2013-12-11 NOTE — Assessment & Plan Note (Signed)
Follow cbc to confirm stable.  

## 2013-12-11 NOTE — Assessment & Plan Note (Signed)
Saw an allergist.  Allergic to grass.  Taking allegra and singulair regularly.  Uses inhalers if needed.  Follow.  Better now.

## 2013-12-11 NOTE — Progress Notes (Signed)
Subjective:    Patient ID: Tina Parker, female    DOB: 10-28-1970, 43 y.o.   MRN: [id number]  HPI 43 year old female with past history of hypertension, hypercholesterolemia, GERD and reoccurring allergy and sinus problems.  Recently diagnosed with rheumatoid arthritis.  Followed by Dr Jefm Bryant.  She comes in today for a scheduled follow up.   States she is doing relatively well.   On MTX.  Had allergy testing.  Allergic to grass.  Taking allegra and singulair.  Also using flonase.  Uses her inhaler prn.   No nausea or vomiting.  No abdominal pain.  Does feel her "hernia is protrucing".  Taking zantac.  Had previously felt this worked better than PPI. With some occasional dysphagia an dsome occasionally swallowing issues, will give her a trial of protonix.   Breathing stable.  No increased cough and congestion.     Past Medical History  Diagnosis Date  . Dysphagia   . Pure hypercholesterolemia   . Anemia   . Hypertension   . Graves disease     remission, no ablation, positive medical treatment  . Allergic rhinitis   . PPD positive     hepatitis secondary to [place]  . Migraine headache   . Carpal tunnel syndrome   . Rheumatoid arthritis(714.0)     positive anti CCP antibodies, positive RF, oligo-articular, MTX  . GERD (gastroesophageal reflux disease)     Current Outpatient Prescriptions on File Prior to Visit  Medication Sig Dispense Refill  . albuterol (PROVENTIL HFA;VENTOLIN HFA) 108 (90 BASE) MCG/ACT inhaler Inhale 2 puffs into the lungs every 6 (six) hours as needed for wheezing or shortness of breath.  1 Inhaler  2  . amLODipine (NORVASC) 10 MG tablet Take 1 tablet (10 mg total) by mouth daily.  30 tablet  5  . fexofenadine (ALLEGRA) 180 MG tablet Take 180 mg by mouth daily.      . folic acid (FOLVITE) 1 MG tablet Take 1 mg by mouth daily.      Marland Kitchen ibuprofen (ADVIL,MOTRIN) 800 MG tablet Take 800 mg by mouth 3 (three) times daily as needed. TID PRN for pain      .  losartan-hydrochlorothiazide (HYZAAR) 50-12.5 MG per tablet Take 1 tablet by mouth daily.  30 tablet  5  . medroxyPROGESTERone (DEPO-PROVERA) 150 MG/ML injection Inject 150 mg into the muscle every 3 (three) months.      . methotrexate (RHEUMATREX) 2.5 MG tablet Take 2.5 mg by mouth once a week. Caution:Chemotherapy. Protect from light. Take 8 tabs po weekly with meals      . montelukast (SINGULAIR) 10 MG tablet Take 1 tablet (10 mg total) by mouth at bedtime.  30 tablet  5  . ranitidine (ZANTAC) 150 MG tablet Take 150 mg by mouth 2 (two) times daily.       No current facility-administered medications on file prior to visit.    Review of Systems Patient denies any headache, lightheadedness or dizziness.  Some sinus and allergy issues intermittently, but much better.  Saw an allergist.  Allergic to grass.  On medication.   No chest pain, tightness or palpitations.  No increased shortness of breath, cough or congestion. Takes zantac.  Has issues with feeling as if her hernia is protruding.  Some minimal dysphagia.  No nausea or vomiting.  No abdominal pain or cramping.  No bowel change, such as diarrhea, BRBPR or melana.   No urine change.  Receives Depo.  Localized rash anterior chest and  right shoulder.       Objective:   Physical Exam  Filed Vitals:   12/05/13 0920  BP: 110/70  Pulse: 100  Temp: 98.5 F (10.37 C)   43 year old female in no acute distress.   HEENT:  Nares- clear.   Oropharynx - without lesions. NECK:  Supple.  Nontender.  No audible bruit.  HEART:  Appears to be regular. LUNGS:  No crackles or wheezing audible.  Respirations even and unlabored.  RADIAL PULSE:  Equal bilaterally.    ABDOMEN:  Soft, nontender.  Bowel sounds present and normal.  No audible abdominal bruit.  EXTREMITIES:  No increased edema present.  DP pulses palpable and equal bilaterally.          Assessment & Plan:  CARDIOVASCULAR.  ECHO 01/27/11 revealed EF 60%, mild mitral and tricuspid  insufficiency.  Currently asymptomatic.    GYN.  Depo.  Follow.    HEALTH MAINTENANCE.   Mammogram 10/18/13 - BiRADS I.  Mother with breast cancer.  Colonoscopy 08/16/07 - internal hemorrhoids.  Recommended follow up colonoscopy 07/2012.  Colonoscopy 03/07/13 revealed a rectal polyp and internal hemorrhoids.    I spent 25 minutes with the patient and more than 50% of the time was spent in consultation regarding the above.

## 2014-01-09 ENCOUNTER — Ambulatory Visit: Payer: Self-pay | Admitting: Unknown Physician Specialty

## 2014-01-15 ENCOUNTER — Encounter: Payer: Self-pay | Admitting: Internal Medicine

## 2014-01-15 DIAGNOSIS — R1310 Dysphagia, unspecified: Secondary | ICD-10-CM | POA: Insufficient documentation

## 2014-01-16 DIAGNOSIS — K224 Dyskinesia of esophagus: Secondary | ICD-10-CM

## 2014-01-16 HISTORY — DX: Dyskinesia of esophagus: K22.4

## 2014-01-30 ENCOUNTER — Ambulatory Visit (INDEPENDENT_AMBULATORY_CARE_PROVIDER_SITE_OTHER): Payer: 59 | Admitting: *Deleted

## 2014-01-30 DIAGNOSIS — Z309 Encounter for contraceptive management, unspecified: Secondary | ICD-10-CM

## 2014-01-30 MED ORDER — MEDROXYPROGESTERONE ACETATE 150 MG/ML IM SUSP
150.0000 mg | Freq: Once | INTRAMUSCULAR | Status: AC
  Administered 2014-01-30: 150 mg via INTRAMUSCULAR

## 2014-02-20 ENCOUNTER — Ambulatory Visit: Payer: 59 | Admitting: Adult Health

## 2014-03-07 ENCOUNTER — Other Ambulatory Visit: Payer: 59

## 2014-03-07 ENCOUNTER — Ambulatory Visit: Payer: 59 | Admitting: Internal Medicine

## 2014-03-13 ENCOUNTER — Other Ambulatory Visit (INDEPENDENT_AMBULATORY_CARE_PROVIDER_SITE_OTHER): Payer: 59

## 2014-03-13 DIAGNOSIS — E039 Hypothyroidism, unspecified: Secondary | ICD-10-CM

## 2014-03-13 DIAGNOSIS — I10 Essential (primary) hypertension: Secondary | ICD-10-CM

## 2014-03-13 DIAGNOSIS — E7800 Pure hypercholesterolemia, unspecified: Secondary | ICD-10-CM

## 2014-03-13 LAB — LIPID PANEL
Cholesterol: 166 mg/dL (ref 0–200)
HDL: 36.6 mg/dL — ABNORMAL LOW (ref >39.00)
LDL Cholesterol: 118 mg/dL — ABNORMAL HIGH (ref 0–99)
NonHDL: 129.4
Total CHOL/HDL Ratio: 5
Triglycerides: 58 mg/dL (ref 0.0–149.0)
VLDL: 11.6 mg/dL (ref 0.0–40.0)

## 2014-03-13 LAB — COMPREHENSIVE METABOLIC PANEL
ALT: 22 U/L (ref 0–35)
AST: 19 U/L (ref 0–37)
Albumin: 3.6 g/dL (ref 3.5–5.2)
Alkaline Phosphatase: 84 U/L (ref 39–117)
BUN: 11 mg/dL (ref 6–23)
CO2: 26 mEq/L (ref 19–32)
Calcium: 8.9 mg/dL (ref 8.4–10.5)
Chloride: 104 mEq/L (ref 96–112)
Creatinine, Ser: 1 mg/dL (ref 0.4–1.2)
GFR: 80.84 mL/min (ref >60.00)
Glucose, Bld: 83 mg/dL (ref 70–99)
Potassium: 4.3 mEq/L (ref 3.5–5.1)
Sodium: 137 mEq/L (ref 135–145)
Total Bilirubin: 0.5 mg/dL (ref 0.2–1.2)
Total Protein: 7.5 g/dL (ref 6.0–8.3)

## 2014-03-13 LAB — TSH: TSH: 3.06 u[IU]/mL (ref 0.35–4.50)

## 2014-03-14 ENCOUNTER — Encounter: Payer: Self-pay | Admitting: Internal Medicine

## 2014-03-14 ENCOUNTER — Ambulatory Visit (INDEPENDENT_AMBULATORY_CARE_PROVIDER_SITE_OTHER): Payer: 59 | Admitting: Internal Medicine

## 2014-03-14 VITALS — BP 110/70 | HR 90 | Temp 98.7°F | Ht 62.0 in | Wt 179.5 lb

## 2014-03-14 DIAGNOSIS — Z800 Family history of malignant neoplasm of digestive organs: Secondary | ICD-10-CM

## 2014-03-14 DIAGNOSIS — R0989 Other specified symptoms and signs involving the circulatory and respiratory systems: Secondary | ICD-10-CM

## 2014-03-14 DIAGNOSIS — R0609 Other forms of dyspnea: Secondary | ICD-10-CM

## 2014-03-14 DIAGNOSIS — E7800 Pure hypercholesterolemia, unspecified: Secondary | ICD-10-CM

## 2014-03-14 DIAGNOSIS — D649 Anemia, unspecified: Secondary | ICD-10-CM

## 2014-03-14 DIAGNOSIS — K219 Gastro-esophageal reflux disease without esophagitis: Secondary | ICD-10-CM

## 2014-03-14 DIAGNOSIS — E039 Hypothyroidism, unspecified: Secondary | ICD-10-CM

## 2014-03-14 DIAGNOSIS — Z23 Encounter for immunization: Secondary | ICD-10-CM

## 2014-03-14 DIAGNOSIS — Z803 Family history of malignant neoplasm of breast: Secondary | ICD-10-CM

## 2014-03-14 DIAGNOSIS — Z9109 Other allergy status, other than to drugs and biological substances: Secondary | ICD-10-CM

## 2014-03-14 DIAGNOSIS — E669 Obesity, unspecified: Secondary | ICD-10-CM

## 2014-03-14 DIAGNOSIS — M069 Rheumatoid arthritis, unspecified: Secondary | ICD-10-CM

## 2014-03-14 DIAGNOSIS — R0683 Snoring: Secondary | ICD-10-CM

## 2014-03-14 DIAGNOSIS — I10 Essential (primary) hypertension: Secondary | ICD-10-CM

## 2014-03-14 MED ORDER — HYDROCHLOROTHIAZIDE 25 MG PO TABS: 25.0000 mg | ORAL_TABLET | Freq: Every day | ORAL | Status: DC

## 2014-03-14 MED ORDER — LOSARTAN POTASSIUM 50 MG PO TABS: 50.0000 mg | ORAL_TABLET | Freq: Every day | ORAL | Status: DC

## 2014-03-14 MED ORDER — AMLODIPINE BESYLATE 5 MG PO TABS: 5.0000 mg | ORAL_TABLET | Freq: Every day | ORAL | Status: DC

## 2014-03-14 NOTE — Progress Notes (Signed)
Pre visit review using our clinic review tool, if applicable. No additional management support is needed unless otherwise documented below in the visit note. 

## 2014-03-14 NOTE — Patient Instructions (Signed)
Stop the losartan/hctz 50/12.5mg  tablet.  Start losartan 50mg  one per day and HCTZ 25mg  one per day.    Change amlodipine to 5mg  per day (instead of 10mg ).

## 2014-03-14 NOTE — Progress Notes (Signed)
Subjective:    Patient ID: Tina Parker, female    DOB: 02-05-1971, 43 y.o.   MRN: [id number]  HPI 43 year old female with past history of hypertension, hypercholesterolemia, GERD and reoccurring allergy and sinus problems.  Recently diagnosed with rheumatoid arthritis.  Followed by Dr Jefm Bryant.  She comes in today for a scheduled follow up.   States she is doing relatively well.   On MTX.  Had allergy testing.  Allergic to grass.  Taking allegra and singulair.  Also using flonase.  Uses her inhaler prn.   Under reasonable control.  No nausea or vomiting.  No abdominal pain.  Breathing stable.  No increased cough and congestion.  Did have increased swelling in her feet/legs and face.  Was seen at [facility].  They added 1/2 HCTZ.  Has been on x 1 month.  Swelling better.  Never had sob, etc.  On amlodipine.  Discussed could contribute to lower extremity swelling.  Overall she feels things are relatively stable.  Some increased fatigue and daytime somnolence.  Snoring.  Some witnessed apnea.     Past Medical History  Diagnosis Date  . Dysphagia   . Pure hypercholesterolemia   . Anemia   . Hypertension   . Graves disease     remission, no ablation, positive medical treatment  . Allergic rhinitis   . PPD positive     hepatitis secondary to [place]  . Migraine headache   . Carpal tunnel syndrome   . Rheumatoid arthritis(714.0)     positive anti CCP antibodies, positive RF, oligo-articular, MTX  . GERD (gastroesophageal reflux disease)     Current Outpatient Prescriptions on File Prior to Visit  Medication Sig Dispense Refill  . albuterol (PROVENTIL HFA;VENTOLIN HFA) 108 (90 BASE) MCG/ACT inhaler Inhale 2 puffs into the lungs every 6 (six) hours as needed for wheezing or shortness of breath.  1 Inhaler  2  . amLODipine (NORVASC) 10 MG tablet Take 1 tablet (10 mg total) by mouth daily.  30 tablet  5  . etanercept (ENBREL) 50 MG/ML injection Inject 50 mg into the skin once a week.      .  fexofenadine (ALLEGRA) 180 MG tablet Take 180 mg by mouth daily.      . folic acid (FOLVITE) 1 MG tablet Take 1 mg by mouth daily.      Marland Kitchen ibuprofen (ADVIL,MOTRIN) 800 MG tablet Take 800 mg by mouth 3 (three) times daily as needed. TID PRN for pain      . losartan-hydrochlorothiazide (HYZAAR) 50-12.5 MG per tablet Take 1 tablet by mouth daily.  30 tablet  5  . medroxyPROGESTERone (DEPO-PROVERA) 150 MG/ML injection Inject 150 mg into the muscle every 3 (three) months.      . methotrexate (RHEUMATREX) 2.5 MG tablet Take 2.5 mg by mouth once a week. Caution:Chemotherapy. Protect from light. Take 8 tabs po weekly with meals      . montelukast (SINGULAIR) 10 MG tablet Take 1 tablet (10 mg total) by mouth at bedtime.  30 tablet  5  . ranitidine (ZANTAC) 150 MG tablet Take 150 mg by mouth 2 (two) times daily.       No current facility-administered medications on file prior to visit.    Review of Systems Patient denies any headache, lightheadedness or dizziness.  Some sinus and allergy issues intermittently, but much better.  Saw an allergist.  Allergic to grass.  On medication.   No chest pain, tightness or palpitations.  No increased shortness of  breath, cough or congestion. Takes zantac.    No nausea or vomiting.  No abdominal pain or cramping.  No bowel change, such as diarrhea, BRBPR or melana.   No urine change.  Receives Depo.  Increased fatigue as outlined.  Snoring and witnessed apnea.  Swelling better.        Objective:   Physical Exam  Filed Vitals:   03/14/14 0941  BP: 110/70  Pulse: 90  Temp: 98.7 F (40.51 C)   43 year old female in no acute distress.   HEENT:  Nares- clear.   Oropharynx - without lesions. NECK:  Supple.  Nontender.  No audible bruit.  HEART:  Appears to be regular. LUNGS:  No crackles or wheezing audible.  Respirations even and unlabored.  RADIAL PULSE:  Equal bilaterally.    ABDOMEN:  Soft, nontender.  Bowel sounds present and normal.  No audible abdominal  bruit.  EXTREMITIES:  No increased edema present.  DP pulses palpable and equal bilaterally.          Assessment & Plan:  CARDIOVASCULAR.  ECHO 01/27/11 revealed EF 60%, mild mitral and tricuspid insufficiency.  Currently asymptomatic.    GYN.  Depo.  Follow.    HEALTH MAINTENANCE.   Mammogram 10/18/13 - BiRADS I.  Mother with breast cancer.  Colonoscopy 08/16/07 - internal hemorrhoids.  Recommended follow up colonoscopy 07/2012.  Colonoscopy 03/07/13 revealed a rectal polyp and internal hemorrhoids.  Sees gyn.   I spent 25 minutes with the patient and more than 50% of the time was spent in consultation regarding the above.

## 2014-03-19 ENCOUNTER — Encounter: Payer: Self-pay | Admitting: Internal Medicine

## 2014-03-19 DIAGNOSIS — R0683 Snoring: Secondary | ICD-10-CM | POA: Insufficient documentation

## 2014-03-19 NOTE — Assessment & Plan Note (Signed)
Blood pressure is under good control.  Given the increased swelling, she was placed on additional hctz as outlined.  Will change amlodipine to 5mg  q day.  Change losartan/hctz to 50/25.  Follow pressures and follow metabolic panel.

## 2014-03-19 NOTE — Assessment & Plan Note (Signed)
Seeing Dr Jefm Bryant.  On Embrel and MTX.  Stable.

## 2014-03-19 NOTE — Assessment & Plan Note (Signed)
Mammogram 10/18/13 - Birads I.

## 2014-03-19 NOTE — Assessment & Plan Note (Signed)
On thyroid replacement.  Follow tsh.  

## 2014-03-19 NOTE — Assessment & Plan Note (Signed)
Low cholesterol diet and exercise.  Follow lipid panel.   

## 2014-03-19 NOTE — Assessment & Plan Note (Addendum)
Diet, exercise and weight loss.  Discussed and gave her information on Dr Lupita Dawn diet.

## 2014-03-19 NOTE — Assessment & Plan Note (Signed)
Follow cbc to confirm stable.  

## 2014-03-19 NOTE — Assessment & Plan Note (Signed)
Saw an allergist.  Allergic to grass.  Taking allegra and singulair regularly.  Uses inhalers if needed.  Follow.  Better now.

## 2014-03-19 NOTE — Assessment & Plan Note (Signed)
Colonoscopy 03/07/13 with rectal polyp.

## 2014-03-19 NOTE — Assessment & Plan Note (Signed)
No increased symptoms reported today.

## 2014-03-19 NOTE — Assessment & Plan Note (Signed)
Snoring and witnessed apnea as outlined.  Increased fatigue.  Concern over possible sleep apnea.  Schedule split night sleep study.

## 2014-05-22 ENCOUNTER — Ambulatory Visit: Payer: 59 | Admitting: Internal Medicine

## 2014-05-30 ENCOUNTER — Encounter (INDEPENDENT_AMBULATORY_CARE_PROVIDER_SITE_OTHER): Payer: Self-pay

## 2014-05-30 ENCOUNTER — Ambulatory Visit (INDEPENDENT_AMBULATORY_CARE_PROVIDER_SITE_OTHER): Payer: 59 | Admitting: *Deleted

## 2014-05-30 DIAGNOSIS — Z3040 Encounter for surveillance of contraceptives, unspecified: Secondary | ICD-10-CM

## 2014-05-30 LAB — POCT URINE PREGNANCY: Preg Test, Ur: NEGATIVE

## 2014-05-30 MED ORDER — MEDROXYPROGESTERONE ACETATE 150 MG/ML IM SUSP
150.0000 mg | Freq: Once | INTRAMUSCULAR | Status: AC
  Administered 2014-05-30: 150 mg via INTRAMUSCULAR

## 2014-05-30 NOTE — Progress Notes (Signed)
Pt presents for Depo Provera injection. Pt last injection 01/30/14, outside of window. UPT negative.

## 2014-07-20 ENCOUNTER — Ambulatory Visit: Payer: 59 | Admitting: Internal Medicine

## 2014-07-31 ENCOUNTER — Encounter: Payer: Self-pay | Admitting: Internal Medicine

## 2014-07-31 ENCOUNTER — Ambulatory Visit (INDEPENDENT_AMBULATORY_CARE_PROVIDER_SITE_OTHER): Payer: 59 | Admitting: Internal Medicine

## 2014-07-31 VITALS — BP 120/80 | HR 105 | Temp 98.5°F | Ht 62.0 in | Wt 176.5 lb

## 2014-07-31 DIAGNOSIS — Z1239 Encounter for other screening for malignant neoplasm of breast: Secondary | ICD-10-CM

## 2014-07-31 DIAGNOSIS — E669 Obesity, unspecified: Secondary | ICD-10-CM

## 2014-07-31 DIAGNOSIS — E7800 Pure hypercholesterolemia, unspecified: Secondary | ICD-10-CM

## 2014-07-31 DIAGNOSIS — Z0000 Encounter for general adult medical examination without abnormal findings: Secondary | ICD-10-CM

## 2014-07-31 DIAGNOSIS — Z803 Family history of malignant neoplasm of breast: Secondary | ICD-10-CM

## 2014-07-31 DIAGNOSIS — Z800 Family history of malignant neoplasm of digestive organs: Secondary | ICD-10-CM

## 2014-07-31 DIAGNOSIS — K219 Gastro-esophageal reflux disease without esophagitis: Secondary | ICD-10-CM

## 2014-07-31 DIAGNOSIS — Z9109 Other allergy status, other than to drugs and biological substances: Secondary | ICD-10-CM

## 2014-07-31 DIAGNOSIS — E039 Hypothyroidism, unspecified: Secondary | ICD-10-CM

## 2014-07-31 DIAGNOSIS — M069 Rheumatoid arthritis, unspecified: Secondary | ICD-10-CM

## 2014-07-31 DIAGNOSIS — Z91048 Other nonmedicinal substance allergy status: Secondary | ICD-10-CM

## 2014-07-31 DIAGNOSIS — I10 Essential (primary) hypertension: Secondary | ICD-10-CM

## 2014-07-31 MED ORDER — CEFUROXIME AXETIL 250 MG PO TABS: 250.0000 mg | ORAL_TABLET | Freq: Two times a day (BID) | ORAL | Status: DC

## 2014-07-31 NOTE — Progress Notes (Signed)
Pre visit review using our clinic review tool, if applicable. No additional management support is needed unless otherwise documented below in the visit note. 

## 2014-07-31 NOTE — Progress Notes (Signed)
Patient ID: Tina Parker, female   DOB: 1971/06/02, 44 y.o.   MRN: [id number]   Subjective:    Patient ID: Tina Parker, female    DOB: 11/05/1970, 44 y.o.   MRN: [id number]  HPI  Patient here for a scheduled follow up.  Has RA.  Only on MTX now.  Seeing Dr Jefm Bryant.  Planning to possible start Humira.  Off enbrel x 1 month.  Concern over possible rash.  Seeing dermatology today. With some increased sinus pressure and sore throat.  Low grade fever.  Some chills.  No chest congestion.  No sob or tightness. Acid reflux controlled.  Eating and drinking well.  Not using her nasal sprays or taking her allergy medications regularly.     Past Medical History  Diagnosis Date  . Dysphagia   . Pure hypercholesterolemia   . Anemia   . Hypertension   . Graves disease     remission, no ablation, positive medical treatment  . Allergic rhinitis   . PPD positive     hepatitis secondary to [place]  . Migraine headache   . Carpal tunnel syndrome   . Rheumatoid arthritis(714.0)     positive anti CCP antibodies, positive RF, oligo-articular, MTX  . GERD (gastroesophageal reflux disease)     Current Outpatient Prescriptions on File Prior to Visit  Medication Sig Dispense Refill  . albuterol (PROVENTIL HFA;VENTOLIN HFA) 108 (90 BASE) MCG/ACT inhaler Inhale 2 puffs into the lungs every 6 (six) hours as needed for wheezing or shortness of breath. 1 Inhaler 2  . amLODipine (NORVASC) 5 MG tablet Take 1 tablet (5 mg total) by mouth daily. 30 tablet 3  . dexlansoprazole (DEXILANT) 60 MG capsule Take 60 mg by mouth daily.    . fexofenadine (ALLEGRA) 180 MG tablet Take 180 mg by mouth daily.    . folic acid (FOLVITE) 1 MG tablet Take 1 mg by mouth daily.    . hydrochlorothiazide (HYDRODIURIL) 25 MG tablet Take 1 tablet (25 mg total) by mouth daily. 30 tablet 3  . ibuprofen (ADVIL,MOTRIN) 800 MG tablet Take 800 mg by mouth 3 (three) times daily as needed. TID PRN for pain    . losartan (COZAAR)  50 MG tablet Take 1 tablet (50 mg total) by mouth daily. 30 tablet 3  . medroxyPROGESTERone (DEPO-PROVERA) 150 MG/ML injection Inject 150 mg into the muscle every 3 (three) months.    . methotrexate (RHEUMATREX) 2.5 MG tablet Take 2.5 mg by mouth once a week. Caution:Chemotherapy. Protect from light. Take 8 tabs po weekly with meals    . montelukast (SINGULAIR) 10 MG tablet Take 1 tablet (10 mg total) by mouth at bedtime. 30 tablet 5  . ranitidine (ZANTAC) 150 MG tablet Take 150 mg by mouth 2 (two) times daily.     No current facility-administered medications on file prior to visit.    Review of Systems  Constitutional: Positive for fever (low grade). Negative for chills and unexpected weight change.  HENT: Positive for congestion, postnasal drip, sinus pressure and sore throat.   Respiratory: Negative for cough, chest tightness and shortness of breath.   Cardiovascular: Negative for chest pain, palpitations and leg swelling.  Gastrointestinal: Negative for nausea, vomiting and abdominal pain.  Neurological: Negative for dizziness and light-headedness.  Hematological: Negative for adenopathy.  Psychiatric/Behavioral:       Handling stress relatively well.  Dealing with some of her daughter's health issues.         Objective:    Physical  Exam  HENT:  Right Ear: External ear normal.  Left Ear: External ear normal.  Mouth/Throat: Oropharynx is clear and moist. No oropharyngeal exudate.  Erythematous turbinates.    Neck: Neck supple. No thyromegaly present.  Cardiovascular: Normal rate and regular rhythm.   Pulmonary/Chest: Breath sounds normal. No respiratory distress. She has no wheezes.  Abdominal: Soft. Bowel sounds are normal. There is no tenderness.  Musculoskeletal: She exhibits no edema or tenderness.  Lymphadenopathy:    She has no cervical adenopathy.  Skin: Skin is warm and dry.  Psychiatric: She has a normal mood and affect. Her behavior is normal.    BP 120/80 mmHg   Pulse 105  Temp(Src) 98.5 F (36.9 C) (Oral)  Ht 5\' 2"  (1.575 m)  Wt 176 lb 8 oz (80.06 kg)  BMI 32.27 kg/m2  SpO2 98%  LMP  Wt Readings from Last 3 Encounters:  07/31/14 176 lb 8 oz (80.06 kg)  03/14/14 179 lb 8 oz (81.421 kg)  12/05/13 174 lb 8 oz (79.153 kg)     Lab Results  Component Value Date   WBC 8.5 10/18/2013   HGB 12.3 10/18/2013   HCT 36.8 10/18/2013   PLT 299.0 10/18/2013   GLUCOSE 83 03/13/2014   CHOL 166 03/13/2014   TRIG 58.0 03/13/2014   HDL 36.60* 03/13/2014   LDLCALC 118* 03/13/2014   ALT 22 03/13/2014   AST 19 03/13/2014   NA 137 03/13/2014   K 4.3 03/13/2014   CL 104 03/13/2014   CREATININE 1.0 03/13/2014   BUN 11 03/13/2014   CO2 26 03/13/2014   TSH 3.06 03/13/2014       Assessment & Plan:   Problem List Items Addressed This Visit    Environmental allergies    Worsened recently.  Possible early sinus infection.  Saline nasal spray and nasacort as directed.  Mucinex DM in the am and robitussin DM in the evening.  Start with these measures first.  If worsening symptoms/infection, rx given for ceftin.  Follow.        Essential hypertension, benign    Blood pressure is doing well.  Same medication regimen.  Follow metabolic panel.        Family history of breast cancer    Mammogram 10/18/13 0 Birads I.  Wants 3D mammogram.        Family history of colon cancer    Colonoscopy 03/07/13 - rectal polyp.        GERD (gastroesophageal reflux disease)    EGD 03/07/13 as outlined.  Doing well on ranitidine.  Follow.        Health care maintenance    Schedule physical next visit.  Mammogram and colonoscopy as outlined.        Hypercholesterolemia    Low cholesterol diet and exercise.  Follow lipid panel.        Hypothyroidism    Follow tsh.        Obesity (BMI 30-39.9)    Diet and exercise.        Rheumatoid arthritis    Off embrel x 1 month.  On MTX.  Seeing Dr Jefm Bryant.  Possibly planning to start humira.         Other Visit  Diagnoses    Breast cancer screening    -  Primary    Relevant Orders    MM DIGITAL SCREENING BILATERAL      I spent 25 minutes with the patient and more than 50% of the time was spent in  consultation regarding the above.     Einar Pheasant, MD

## 2014-07-31 NOTE — Patient Instructions (Addendum)
Saline nasal spray - flush nose at least 2-3x/day  nasacort nasal spray - 2 sprays each nostril one time per day.  Do this in the evening.    mucinex in the am and robitussin DM in the evening.   

## 2014-08-02 ENCOUNTER — Encounter: Payer: Self-pay | Admitting: Internal Medicine

## 2014-08-02 DIAGNOSIS — Z0000 Encounter for general adult medical examination without abnormal findings: Secondary | ICD-10-CM | POA: Insufficient documentation

## 2014-08-02 NOTE — Assessment & Plan Note (Signed)
Colonoscopy 03/07/13 - rectal polyp.

## 2014-08-02 NOTE — Assessment & Plan Note (Signed)
Low cholesterol diet and exercise.  Follow lipid panel.   

## 2014-08-02 NOTE — Assessment & Plan Note (Signed)
Blood pressure is doing well.  Same medication regimen.  Follow metabolic panel.  

## 2014-08-02 NOTE — Assessment & Plan Note (Signed)
Schedule physical next visit.  Mammogram and colonoscopy as outlined.

## 2014-08-02 NOTE — Assessment & Plan Note (Signed)
Worsened recently.  Possible early sinus infection.  Saline nasal spray and nasacort as directed.  Mucinex DM in the am and robitussin DM in the evening.  Start with these measures first.  If worsening symptoms/infection, rx given for ceftin.  Follow.

## 2014-08-02 NOTE — Assessment & Plan Note (Signed)
Follow tsh.  

## 2014-08-02 NOTE — Assessment & Plan Note (Signed)
Mammogram 10/18/13 0 Birads I.  Wants 3D mammogram.

## 2014-08-02 NOTE — Assessment & Plan Note (Signed)
Off embrel x 1 month.  On MTX.  Seeing Dr Jefm Bryant.  Possibly planning to start humira.

## 2014-08-02 NOTE — Assessment & Plan Note (Signed)
Diet and exercise.   

## 2014-08-02 NOTE — Assessment & Plan Note (Signed)
EGD 03/07/13 as outlined.  Doing well on ranitidine.  Follow.

## 2014-08-15 ENCOUNTER — Ambulatory Visit (INDEPENDENT_AMBULATORY_CARE_PROVIDER_SITE_OTHER): Payer: 59 | Admitting: *Deleted

## 2014-08-15 ENCOUNTER — Ambulatory Visit: Payer: 59

## 2014-08-15 DIAGNOSIS — Z308 Encounter for other contraceptive management: Secondary | ICD-10-CM

## 2014-08-15 MED ORDER — MEDROXYPROGESTERONE ACETATE 150 MG/ML IM SUSP
150.0000 mg | Freq: Once | INTRAMUSCULAR | Status: AC
  Administered 2014-08-15: 150 mg via INTRAMUSCULAR

## 2014-11-02 ENCOUNTER — Other Ambulatory Visit: Payer: Self-pay | Admitting: *Deleted

## 2014-11-02 MED ORDER — LOSARTAN POTASSIUM 50 MG PO TABS: 50.0000 mg | ORAL_TABLET | Freq: Every day | ORAL | Status: DC

## 2014-11-02 MED ORDER — AMLODIPINE BESYLATE 5 MG PO TABS: 5.0000 mg | ORAL_TABLET | Freq: Every day | ORAL | Status: DC

## 2014-11-07 ENCOUNTER — Ambulatory Visit: Payer: 59

## 2014-11-14 ENCOUNTER — Ambulatory Visit (INDEPENDENT_AMBULATORY_CARE_PROVIDER_SITE_OTHER): Payer: 59 | Admitting: *Deleted

## 2014-11-14 DIAGNOSIS — Z30019 Encounter for initial prescription of contraceptives, unspecified: Secondary | ICD-10-CM

## 2014-11-14 DIAGNOSIS — Z308 Encounter for other contraceptive management: Secondary | ICD-10-CM | POA: Diagnosis not present

## 2014-11-14 MED ORDER — MEDROXYPROGESTERONE ACETATE 150 MG/ML IM SUSP
150.0000 mg | Freq: Once | INTRAMUSCULAR | Status: AC
  Administered 2014-11-14: 150 mg via INTRAMUSCULAR

## 2014-11-20 ENCOUNTER — Ambulatory Visit
Admission: RE | Admit: 2014-11-20 | Discharge: 2014-11-20 | Disposition: A | Discharge status: Home / Self Care | Payer: 59 | Source: Ambulatory Visit | Attending: Internal Medicine | Admitting: Internal Medicine

## 2014-11-20 DIAGNOSIS — Z1231 Encounter for screening mammogram for malignant neoplasm of breast: Secondary | ICD-10-CM | POA: Diagnosis not present

## 2014-11-20 DIAGNOSIS — Z1239 Encounter for other screening for malignant neoplasm of breast: Secondary | ICD-10-CM

## 2014-12-04 ENCOUNTER — Encounter: Payer: 59 | Admitting: Internal Medicine

## 2014-12-11 ENCOUNTER — Other Ambulatory Visit: Payer: Self-pay | Admitting: *Deleted

## 2014-12-11 MED ORDER — MONTELUKAST SODIUM 10 MG PO TABS: 10.0000 mg | ORAL_TABLET | Freq: Every day | ORAL | Status: DC

## 2015-01-05 ENCOUNTER — Ambulatory Visit (INDEPENDENT_AMBULATORY_CARE_PROVIDER_SITE_OTHER): Payer: Commercial Managed Care - HMO | Admitting: Internal Medicine

## 2015-01-05 ENCOUNTER — Encounter: Payer: Self-pay | Admitting: Internal Medicine

## 2015-01-05 VITALS — BP 120/80 | HR 77 | Temp 98.8°F | Ht 61.0 in | Wt 178.0 lb

## 2015-01-05 DIAGNOSIS — Z803 Family history of malignant neoplasm of breast: Secondary | ICD-10-CM

## 2015-01-05 DIAGNOSIS — I10 Essential (primary) hypertension: Secondary | ICD-10-CM | POA: Diagnosis not present

## 2015-01-05 DIAGNOSIS — E039 Hypothyroidism, unspecified: Secondary | ICD-10-CM | POA: Diagnosis not present

## 2015-01-05 DIAGNOSIS — K219 Gastro-esophageal reflux disease without esophagitis: Secondary | ICD-10-CM

## 2015-01-05 DIAGNOSIS — Z0000 Encounter for general adult medical examination without abnormal findings: Secondary | ICD-10-CM | POA: Diagnosis not present

## 2015-01-05 DIAGNOSIS — Z800 Family history of malignant neoplasm of digestive organs: Secondary | ICD-10-CM

## 2015-01-05 DIAGNOSIS — M069 Rheumatoid arthritis, unspecified: Secondary | ICD-10-CM | POA: Diagnosis not present

## 2015-01-05 DIAGNOSIS — E669 Obesity, unspecified: Secondary | ICD-10-CM

## 2015-01-05 DIAGNOSIS — D649 Anemia, unspecified: Secondary | ICD-10-CM

## 2015-01-05 DIAGNOSIS — E7800 Pure hypercholesterolemia, unspecified: Secondary | ICD-10-CM

## 2015-01-05 DIAGNOSIS — R059 Cough, unspecified: Secondary | ICD-10-CM

## 2015-01-05 DIAGNOSIS — R05 Cough: Secondary | ICD-10-CM

## 2015-01-05 DIAGNOSIS — G479 Sleep disorder, unspecified: Secondary | ICD-10-CM

## 2015-01-05 MED ORDER — ALBUTEROL SULFATE HFA 108 (90 BASE) MCG/ACT IN AERS
2.0000 | INHALATION_SPRAY | Freq: Four times a day (QID) | RESPIRATORY_TRACT | Status: DC | PRN
Start: 2015-01-05 — End: 2020-11-22

## 2015-01-05 MED ORDER — TRAZODONE HCL 50 MG PO TABS: 25.0000 mg | ORAL_TABLET | Freq: Every evening | ORAL | Status: DC | PRN

## 2015-01-05 NOTE — Progress Notes (Signed)
Patient ID: Tina Parker, female   DOB: 1971/04/13, 44 y.o.   MRN: [id number]   Subjective:    Patient ID: Tina Parker, female    DOB: [birth year], 44 y.o.   MRN: [id number]  HPI  Patient here to follow up on her current medical issues as well as for a physical exam.  She reports noticing some cough for the last 3-4 weeks.  No fever.  No chest congestion.  No fever.  Does not have a rescue inhaler.  Is better.  She is not sleeping.  Has trouble staying asleep.  Tries to stay active.  No cardiac symptoms with increased activity or exertion.  Bowels stable.     Past Medical History  Diagnosis Date  . Dysphagia   . Pure hypercholesterolemia   . Anemia   . Hypertension   . Graves disease     remission, no ablation, positive medical treatment  . Allergic rhinitis   . PPD positive     hepatitis secondary to [place]  . Migraine headache   . Carpal tunnel syndrome   . Rheumatoid arthritis(714.0)     positive anti CCP antibodies, positive RF, oligo-articular, MTX  . GERD (gastroesophageal reflux disease)     Current Outpatient Prescriptions on File Prior to Visit  Medication Sig Dispense Refill  . amLODipine (NORVASC) 5 MG tablet Take 1 tablet (5 mg total) by mouth daily. 30 tablet 5  . dexlansoprazole (DEXILANT) 60 MG capsule Take 60 mg by mouth daily.    . fexofenadine (ALLEGRA) 180 MG tablet Take 180 mg by mouth daily.    . folic acid (FOLVITE) 1 MG tablet Take 1 mg by mouth daily.    . hydrochlorothiazide (HYDRODIURIL) 25 MG tablet Take 1 tablet (25 mg total) by mouth daily. 30 tablet 3  . ibuprofen (ADVIL,MOTRIN) 800 MG tablet Take 800 mg by mouth 3 (three) times daily as needed. TID PRN for pain    . losartan (COZAAR) 50 MG tablet Take 1 tablet (50 mg total) by mouth daily. 30 tablet 5  . medroxyPROGESTERone (DEPO-PROVERA) 150 MG/ML injection Inject 150 mg into the muscle every 3 (three) months.    . methotrexate (RHEUMATREX) 2.5 MG tablet Take 2.5 mg by mouth once a  week. Caution:Chemotherapy. Protect from light. Take 8 tabs po weekly with meals    . montelukast (SINGULAIR) 10 MG tablet Take 1 tablet (10 mg total) by mouth at bedtime. 30 tablet 5  . ranitidine (ZANTAC) 150 MG tablet Take 150 mg by mouth 2 (two) times daily.     No current facility-administered medications on file prior to visit.    Review of Systems  Constitutional: Negative for appetite change and unexpected weight change.  HENT: Negative for sinus pressure and sore throat.   Eyes: Negative for pain and visual disturbance.  Respiratory: Positive for cough. Negative for chest tightness and shortness of breath.   Cardiovascular: Negative for chest pain, palpitations and leg swelling.  Gastrointestinal: Negative for nausea, vomiting, abdominal pain and diarrhea.  Genitourinary: Negative for dysuria and difficulty urinating.  Musculoskeletal: Negative for back pain.       Seeing Dr Jefm Bryant for her arthritis.    Skin: Negative for color change and rash.  Neurological: Negative for dizziness, light-headedness and headaches.  Hematological: Negative for adenopathy. Does not bruise/bleed easily.  Psychiatric/Behavioral: Positive for sleep disturbance. Negative for dysphoric mood and agitation.       Objective:    Physical Exam  Constitutional: She is oriented to  person, place, and time. She appears well-developed and well-nourished.  HENT:  Nose: Nose normal.  Mouth/Throat: Oropharynx is clear and moist.  Eyes: Right eye exhibits no discharge. Left eye exhibits no discharge. No scleral icterus.  Neck: Neck supple. No thyromegaly present.  Cardiovascular: Normal rate and regular rhythm.   Pulmonary/Chest: Breath sounds normal. No accessory muscle usage. No tachypnea. No respiratory distress. She has no decreased breath sounds. She has no wheezes. She has no rhonchi. Right breast exhibits no inverted nipple, no mass, no nipple discharge and no tenderness (no axillary adenopathy). Left  breast exhibits no inverted nipple, no mass, no nipple discharge and no tenderness (no axilarry adenopathy).  Abdominal: Soft. Bowel sounds are normal. There is no tenderness.  Musculoskeletal: She exhibits no edema or tenderness.  Lymphadenopathy:    She has no cervical adenopathy.  Neurological: She is alert and oriented to person, place, and time.  Skin: Skin is warm. No rash noted.  Psychiatric: She has a normal mood and affect. Her behavior is normal.    BP 120/80 mmHg  Pulse 77  Temp(Src) 98.8 F (37.1 C) (Oral)  Ht 5\' 1"  (1.549 m)  Wt 178 lb (80.74 kg)  BMI 33.65 kg/m2  SpO2 99% Wt Readings from Last 3 Encounters:  01/05/15 178 lb (80.74 kg)  07/31/14 176 lb 8 oz (80.06 kg)  03/14/14 179 lb 8 oz (81.421 kg)     Lab Results  Component Value Date   WBC 8.5 10/18/2013   HGB 12.3 10/18/2013   HCT 36.8 10/18/2013   PLT 299.0 10/18/2013   GLUCOSE 83 03/13/2014   CHOL 166 03/13/2014   TRIG 58.0 03/13/2014   HDL 36.60* 03/13/2014   LDLCALC 118* 03/13/2014   ALT 22 03/13/2014   AST 19 03/13/2014   NA 137 03/13/2014   K 4.3 03/13/2014   CL 104 03/13/2014   CREATININE 1.0 03/13/2014   BUN 11 03/13/2014   CO2 26 03/13/2014   TSH 3.06 03/13/2014    Mm Digital Screening Bilateral  11/20/2014   CLINICAL DATA:  Screening.  EXAM: DIGITAL SCREENING BILATERAL MAMMOGRAM WITH CAD  COMPARISON:  Previous exam(s).  ACR Breast Density Category c: The breast tissue is heterogeneously dense, which may obscure small masses.  FINDINGS: There are no findings suspicious for malignancy. Images were processed with CAD.  IMPRESSION: No mammographic evidence of malignancy. A result letter of this screening mammogram will be mailed directly to the patient.  RECOMMENDATION: Screening mammogram in one year. (Code:SM-B-01Y)  BI-RADS CATEGORY  1: Negative.   Electronically Signed   By: Claudie Revering M.D.   On: 11/20/2014 10:33       Assessment & Plan:   Problem List Items Addressed This Visit     Anemia    Follow cbc.        Cough    Is better.  Continue singulair.  Albuterol inhaler as directed.        Difficulty sleeping    Discussed with her today.  Start trazodone.  Follow.  Get her back in soon to reassess.        Essential hypertension, benign    Blood pressure under good control.  Follow pressures.  Follow metabolic panel.  Same medication regimen.        Family history of breast cancer    Mammogram 11/20/14 - Birads I.       Family history of colon cancer    Colonoscopy 03/07/13 - rectal polyp.  GERD (gastroesophageal reflux disease)    EGD 03/07/13 - as outlined in interview.  No repeat EGD recommended.        Health care maintenance - Primary    Physical today 01/05/15.  Mammogram 11/20/14 - Birads I.  03/07/13 - colonoscopy - rectal polyp.        Hypercholesterolemia    Low cholesterol diet and exercise.  Follow lipid panel.        Hypothyroidism    Follow tsh.       Obesity (BMI 30-39.9)    Diet and exercise.        Rheumatoid arthritis    Followed by Dr Jefm Bryant.       Relevant Medications   HUMIRA PEN 40 MG/0.8ML PNKT     I spent 25 minutes with the patient and more than 50% of the time was spent in consultation regarding the above.     Einar Pheasant, MD

## 2015-01-05 NOTE — Progress Notes (Signed)
Pre visit review using our clinic review tool, if applicable. No additional management support is needed unless otherwise documented below in the visit note. 

## 2015-01-07 ENCOUNTER — Encounter: Payer: Self-pay | Admitting: Internal Medicine

## 2015-01-07 DIAGNOSIS — G479 Sleep disorder, unspecified: Secondary | ICD-10-CM | POA: Insufficient documentation

## 2015-01-07 DIAGNOSIS — R05 Cough: Secondary | ICD-10-CM | POA: Insufficient documentation

## 2015-01-07 DIAGNOSIS — R059 Cough, unspecified: Secondary | ICD-10-CM | POA: Insufficient documentation

## 2015-01-07 NOTE — Assessment & Plan Note (Signed)
Colonoscopy 03/07/13 - rectal polyp.

## 2015-01-07 NOTE — Assessment & Plan Note (Signed)
Follow tsh.  

## 2015-01-07 NOTE — Assessment & Plan Note (Signed)
Follow cbc.  

## 2015-01-07 NOTE — Assessment & Plan Note (Signed)
Mammogram 11/20/14 - Birads I.

## 2015-01-07 NOTE — Assessment & Plan Note (Signed)
Discussed with her today.  Start trazodone.  Follow.  Get her back in soon to reassess.

## 2015-01-07 NOTE — Assessment & Plan Note (Signed)
Physical today 01/05/15.  Mammogram 11/20/14 - Birads I.  03/07/13 - colonoscopy - rectal polyp.

## 2015-01-07 NOTE — Assessment & Plan Note (Signed)
Blood pressure under good control.  Follow pressures.  Follow metabolic panel.  Same medication regimen.

## 2015-01-07 NOTE — Assessment & Plan Note (Signed)
Is better.  Continue singulair.  Albuterol inhaler as directed.

## 2015-01-07 NOTE — Assessment & Plan Note (Signed)
EGD 03/07/13 - as outlined in interview.  No repeat EGD recommended.

## 2015-01-07 NOTE — Assessment & Plan Note (Signed)
Diet and exercise.   

## 2015-01-07 NOTE — Assessment & Plan Note (Signed)
Low cholesterol diet and exercise.  Follow lipid panel.   

## 2015-01-07 NOTE — Assessment & Plan Note (Signed)
Followed by Dr Kernodle.   

## 2015-01-17 ENCOUNTER — Other Ambulatory Visit: Payer: Self-pay

## 2015-01-17 MED ORDER — HYDROCHLOROTHIAZIDE 25 MG PO TABS: 25.0000 mg | ORAL_TABLET | Freq: Every day | ORAL | Status: DC

## 2015-01-30 ENCOUNTER — Ambulatory Visit: Payer: 59

## 2015-02-13 ENCOUNTER — Encounter: Payer: 59 | Admitting: Internal Medicine

## 2015-02-14 ENCOUNTER — Ambulatory Visit (INDEPENDENT_AMBULATORY_CARE_PROVIDER_SITE_OTHER): Payer: Commercial Managed Care - HMO

## 2015-02-14 DIAGNOSIS — Z3042 Encounter for surveillance of injectable contraceptive: Secondary | ICD-10-CM

## 2015-02-14 DIAGNOSIS — Z3049 Encounter for surveillance of other contraceptives: Secondary | ICD-10-CM

## 2015-02-14 LAB — POCT URINE PREGNANCY: Preg Test, Ur: NEGATIVE

## 2015-02-14 MED ORDER — MEDROXYPROGESTERONE ACETATE 150 MG/ML IM SUSP
150.0000 mg | Freq: Once | INTRAMUSCULAR | Status: AC
  Administered 2015-02-14: 150 mg via INTRAMUSCULAR

## 2015-02-14 NOTE — Progress Notes (Signed)
Patient came in for Depo shot.  Patient was one day post scheduled return date for injection.  Sent her to the lab to complete a pregnancy test prior to injection.  Pregnancy test resulted and patient received injection.  See MAR for details.  Patient tolerated well.  Gave patient Depo schedule for next injection.

## 2015-02-27 ENCOUNTER — Encounter: Payer: Self-pay | Admitting: Internal Medicine

## 2015-02-27 ENCOUNTER — Ambulatory Visit (INDEPENDENT_AMBULATORY_CARE_PROVIDER_SITE_OTHER): Payer: Commercial Managed Care - HMO | Admitting: Internal Medicine

## 2015-02-27 ENCOUNTER — Encounter (INDEPENDENT_AMBULATORY_CARE_PROVIDER_SITE_OTHER): Payer: Self-pay

## 2015-02-27 VITALS — BP 135/81 | HR 70 | Temp 98.7°F | Ht 61.0 in | Wt 172.1 lb

## 2015-02-27 DIAGNOSIS — E669 Obesity, unspecified: Secondary | ICD-10-CM

## 2015-02-27 DIAGNOSIS — K219 Gastro-esophageal reflux disease without esophagitis: Secondary | ICD-10-CM | POA: Diagnosis not present

## 2015-02-27 DIAGNOSIS — Z800 Family history of malignant neoplasm of digestive organs: Secondary | ICD-10-CM

## 2015-02-27 DIAGNOSIS — M069 Rheumatoid arthritis, unspecified: Secondary | ICD-10-CM | POA: Diagnosis not present

## 2015-02-27 DIAGNOSIS — Z9109 Other allergy status, other than to drugs and biological substances: Secondary | ICD-10-CM

## 2015-02-27 DIAGNOSIS — Z23 Encounter for immunization: Secondary | ICD-10-CM | POA: Diagnosis not present

## 2015-02-27 DIAGNOSIS — G479 Sleep disorder, unspecified: Secondary | ICD-10-CM

## 2015-02-27 DIAGNOSIS — D649 Anemia, unspecified: Secondary | ICD-10-CM

## 2015-02-27 DIAGNOSIS — Z91048 Other nonmedicinal substance allergy status: Secondary | ICD-10-CM

## 2015-02-27 DIAGNOSIS — I10 Essential (primary) hypertension: Secondary | ICD-10-CM | POA: Diagnosis not present

## 2015-02-27 DIAGNOSIS — Z803 Family history of malignant neoplasm of breast: Secondary | ICD-10-CM

## 2015-02-27 DIAGNOSIS — E7800 Pure hypercholesterolemia, unspecified: Secondary | ICD-10-CM

## 2015-02-27 NOTE — Progress Notes (Signed)
Patient ID: Tina Parker, female   DOB: [birth year], 44 y.o.   MRN: [id number]   Subjective:    Patient ID: Tina Parker, female    DOB: [birth year], 44 y.o.   MRN: [id number]  HPI  Patient here for a scheduled follow up.  She has done a boot camp.  Has been exercising.  Feels better.  Sleeping better.  Blood pressure better.  No cardiac symptoms with increased activity or exertion.  No sob.  No acid reflux.  Allergies seem to be better.  No nausea or vomiting.  Bowels stable.     Past Medical History  Diagnosis Date  . Dysphagia   . Pure hypercholesterolemia   . Anemia   . Hypertension   . Graves disease     remission, no ablation, positive medical treatment  . Allergic rhinitis   . PPD positive     hepatitis secondary to [place]  . Migraine headache   . Carpal tunnel syndrome   . Rheumatoid arthritis(714.0)     positive anti CCP antibodies, positive RF, oligo-articular, MTX  . GERD (gastroesophageal reflux disease)    Past Surgical History  Procedure Laterality Date  . Cesarean section  1996  . Post septoplasty and turbinate reduction  2007   Family History  Problem Relation Age of Onset  . Cancer Mother     Breast Cancer  . Breast cancer Mother 43  . Cancer Father     Colon Cancer  . Heart disease Father     Hx of MI  . Hypertension Father   . Diabetes Father   . Cancer Maternal Grandmother     lung cancer  . Cancer Maternal Uncle     esophageal   Social History   Social History  . Marital Status: Married    Spouse Name: N/A  . Number of Children: N/A  . Years of Education: N/A   Social History Main Topics  . Smoking status: Never Smoker   . Smokeless tobacco: Never Used  . Alcohol Use: 0.0 oz/week    0 Standard drinks or equivalent per week     Comment: occasionally  . Drug Use: No  . Sexual Activity: Not Asked   Other Topics Concern  . None   Social History Narrative    Outpatient Encounter Prescriptions as of 02/27/2015    Medication Sig  . albuterol (PROVENTIL HFA;VENTOLIN HFA) 108 (90 BASE) MCG/ACT inhaler Inhale 2 puffs into the lungs every 6 (six) hours as needed for wheezing or shortness of breath.  Marland Kitchen amLODipine (NORVASC) 5 MG tablet Take 1 tablet (5 mg total) by mouth daily.  Marland Kitchen dexlansoprazole (DEXILANT) 60 MG capsule Take 60 mg by mouth daily.  . fexofenadine (ALLEGRA) 180 MG tablet Take 180 mg by mouth daily.  . folic acid (FOLVITE) 1 MG tablet Take 1 mg by mouth daily.  Marland Kitchen HUMIRA PEN 40 MG/0.8ML PNKT   . hydrochlorothiazide (HYDRODIURIL) 25 MG tablet Take 1 tablet (25 mg total) by mouth daily.  Marland Kitchen ibuprofen (ADVIL,MOTRIN) 800 MG tablet Take 800 mg by mouth 3 (three) times daily as needed. TID PRN for pain  . losartan (COZAAR) 50 MG tablet Take 1 tablet (50 mg total) by mouth daily.  . medroxyPROGESTERone (DEPO-PROVERA) 150 MG/ML injection Inject 150 mg into the muscle every 3 (three) months.  . methotrexate (RHEUMATREX) 2.5 MG tablet Take 2.5 mg by mouth once a week. Caution:Chemotherapy. Protect from light. Take 8 tabs po weekly with meals  . montelukast (SINGULAIR) 10  MG tablet Take 1 tablet (10 mg total) by mouth at bedtime.  . ranitidine (ZANTAC) 150 MG tablet Take 150 mg by mouth 2 (two) times daily.  . traZODone (DESYREL) 50 MG tablet Take 0.5-1 tablets (25-50 mg total) by mouth at bedtime as needed for sleep.   No facility-administered encounter medications on file as of 02/27/2015.    Review of Systems  Constitutional: Negative for appetite change.       Has been exercising.  Has adjusted diet.  Lost weight.   HENT: Negative for congestion and sinus pressure.   Respiratory: Negative for cough, chest tightness and shortness of breath.   Cardiovascular: Negative for chest pain, palpitations and leg swelling.  Gastrointestinal: Negative for nausea, vomiting, abdominal pain and diarrhea.  Musculoskeletal: Negative for back pain.       Joints stable.   Skin: Negative for color change and rash.   Neurological: Negative for dizziness, light-headedness and headaches.  Hematological: Negative for adenopathy. Does not bruise/bleed easily.  Psychiatric/Behavioral: Negative for dysphoric mood and agitation.       Objective:    Physical Exam  Constitutional: She appears well-developed and well-nourished. No distress.  HENT:  Nose: Nose normal.  Mouth/Throat: Oropharynx is clear and moist.  Eyes: Conjunctivae are normal. Right eye exhibits no discharge. Left eye exhibits no discharge.  Neck: Neck supple. No thyromegaly present.  Cardiovascular: Normal rate and regular rhythm.   Pulmonary/Chest: Breath sounds normal. No respiratory distress. She has no wheezes.  Abdominal: Soft. Bowel sounds are normal. There is no tenderness.  Musculoskeletal: She exhibits no edema or tenderness.  Lymphadenopathy:    She has no cervical adenopathy.  Skin: No rash noted. No erythema.  Psychiatric: She has a normal mood and affect. Her behavior is normal.    BP 135/81 mmHg  Pulse 70  Temp(Src) 98.7 F (37.1 C) (Oral)  Ht 5\' 1"  (1.549 m)  Wt 172 lb 2 oz (78.075 kg)  BMI 32.54 kg/m2  SpO2 100% Wt Readings from Last 3 Encounters:  02/27/15 172 lb 2 oz (78.075 kg)  01/05/15 178 lb (80.74 kg)  07/31/14 176 lb 8 oz (80.06 kg)     Lab Results  Component Value Date   WBC 8.5 10/18/2013   HGB 12.3 10/18/2013   HCT 36.8 10/18/2013   PLT 299.0 10/18/2013   GLUCOSE 83 03/13/2014   CHOL 166 03/13/2014   TRIG 58.0 03/13/2014   HDL 36.60* 03/13/2014   LDLCALC 118* 03/13/2014   ALT 22 03/13/2014   AST 19 03/13/2014   NA 137 03/13/2014   K 4.3 03/13/2014   CL 104 03/13/2014   CREATININE 1.0 03/13/2014   BUN 11 03/13/2014   CO2 26 03/13/2014   TSH 3.06 03/13/2014    Mm Digital Screening Bilateral  11/20/2014   CLINICAL DATA:  Screening.  EXAM: DIGITAL SCREENING BILATERAL MAMMOGRAM WITH CAD  COMPARISON:  Previous exam(s).  ACR Breast Density Category c: The breast tissue is heterogeneously  dense, which may obscure small masses.  FINDINGS: There are no findings suspicious for malignancy. Images were processed with CAD.  IMPRESSION: No mammographic evidence of malignancy. A result letter of this screening mammogram will be mailed directly to the patient.  RECOMMENDATION: Screening mammogram in one year. (Code:SM-B-01Y)  BI-RADS CATEGORY  1: Negative.   Electronically Signed   By: Claudie Revering M.D.   On: 11/20/2014 10:33      Assessment & Plan:   Problem List Items Addressed This Visit    Anemia  Follow cbc.       Difficulty sleeping    Sleeping better after exercise and weight loss.  Follow.       Environmental allergies    Doing better now.  Continue current regimen.       Essential hypertension, benign    Blood pressure under good control.  Continue same medication regimen.  Follow pressures.  Follow metabolic panel.        Family history of breast cancer    Mammogram 11/20/14 - Birads I.       Family history of colon cancer    Colonoscopy 03/07/13 - rectal polyp.        GERD (gastroesophageal reflux disease)    On dexilant and zantac.  Follow.  Symptoms controlled.        Hypercholesterolemia    Low cholesterol diet and exercise.  Follow lipid panel.       Obesity (BMI 30-39.9)    Diet and exercise.  Follow.  Has done well with exercise.  Has lost weight.       Rheumatoid arthritis    Followed by Dr Jefm Bryant.         Other Visit Diagnoses    Encounter for immunization    -  Primary        Einar Pheasant, MD

## 2015-02-27 NOTE — Progress Notes (Signed)
Pre-visit discussion using our clinic review tool. No additional management support is needed unless otherwise documented below in the visit note.  

## 2015-03-05 ENCOUNTER — Encounter: Payer: Self-pay | Admitting: Internal Medicine

## 2015-03-05 NOTE — Assessment & Plan Note (Signed)
Colonoscopy 03/07/13 - rectal polyp.   

## 2015-03-05 NOTE — Assessment & Plan Note (Signed)
Diet and exercise.  Follow.  Has done well with exercise.  Has lost weight.

## 2015-03-05 NOTE — Assessment & Plan Note (Signed)
Sleeping better after exercise and weight loss.  Follow.

## 2015-03-05 NOTE — Assessment & Plan Note (Signed)
Followed by Dr Kernodle.   

## 2015-03-05 NOTE — Assessment & Plan Note (Signed)
Blood pressure under good control.  Continue same medication regimen.  Follow pressures.  Follow metabolic panel.   

## 2015-03-05 NOTE — Assessment & Plan Note (Signed)
Follow cbc.  

## 2015-03-05 NOTE — Assessment & Plan Note (Signed)
Doing better now.  Continue current regimen.   

## 2015-03-05 NOTE — Assessment & Plan Note (Signed)
Low cholesterol diet and exercise.  Follow lipid panel.   

## 2015-03-05 NOTE — Assessment & Plan Note (Signed)
Mammogram 11/20/14 - Birads I.  

## 2015-03-05 NOTE — Assessment & Plan Note (Signed)
On dexilant and zantac.  Follow.  Symptoms controlled.

## 2015-03-06 LAB — LIPID PANEL
Cholesterol: 177 mg/dL (ref 0–200)
HDL: 40 mg/dL (ref 35–70)
LDL Cholesterol: 119 mg/dL
Triglycerides: 90 mg/dL (ref 40–160)

## 2015-03-07 ENCOUNTER — Encounter: Payer: Self-pay | Admitting: Internal Medicine

## 2015-04-16 ENCOUNTER — Ambulatory Visit: Payer: Commercial Managed Care - HMO | Admitting: Internal Medicine

## 2015-05-08 ENCOUNTER — Ambulatory Visit (INDEPENDENT_AMBULATORY_CARE_PROVIDER_SITE_OTHER): Payer: Commercial Managed Care - HMO

## 2015-05-08 DIAGNOSIS — Z3042 Encounter for surveillance of injectable contraceptive: Secondary | ICD-10-CM

## 2015-05-08 DIAGNOSIS — Z3049 Encounter for surveillance of other contraceptives: Secondary | ICD-10-CM | POA: Diagnosis not present

## 2015-05-08 MED ORDER — MEDROXYPROGESTERONE ACETATE 150 MG/ML IM SUSP
150.0000 mg | Freq: Once | INTRAMUSCULAR | Status: AC
  Administered 2015-05-08: 150 mg via INTRAMUSCULAR

## 2015-05-08 NOTE — Progress Notes (Signed)
Patient came in for Depo Provera shot.  Received in R upper Outer Quadrant.  Patient tolerated well.  Gave calendar for next injection.

## 2015-05-28 ENCOUNTER — Encounter: Payer: Self-pay | Admitting: Internal Medicine

## 2015-05-28 ENCOUNTER — Ambulatory Visit (INDEPENDENT_AMBULATORY_CARE_PROVIDER_SITE_OTHER): Payer: Commercial Managed Care - HMO | Admitting: Internal Medicine

## 2015-05-28 VITALS — BP 120/80 | HR 79 | Temp 98.1°F | Resp 18 | Ht 61.0 in | Wt 163.5 lb

## 2015-05-28 DIAGNOSIS — Z803 Family history of malignant neoplasm of breast: Secondary | ICD-10-CM

## 2015-05-28 DIAGNOSIS — K219 Gastro-esophageal reflux disease without esophagitis: Secondary | ICD-10-CM

## 2015-05-28 DIAGNOSIS — I10 Essential (primary) hypertension: Secondary | ICD-10-CM

## 2015-05-28 DIAGNOSIS — E7800 Pure hypercholesterolemia, unspecified: Secondary | ICD-10-CM

## 2015-05-28 DIAGNOSIS — Z9109 Other allergy status, other than to drugs and biological substances: Secondary | ICD-10-CM

## 2015-05-28 DIAGNOSIS — Z91048 Other nonmedicinal substance allergy status: Secondary | ICD-10-CM

## 2015-05-28 DIAGNOSIS — G479 Sleep disorder, unspecified: Secondary | ICD-10-CM

## 2015-05-28 DIAGNOSIS — D649 Anemia, unspecified: Secondary | ICD-10-CM

## 2015-05-28 DIAGNOSIS — M059 Rheumatoid arthritis with rheumatoid factor, unspecified: Secondary | ICD-10-CM | POA: Diagnosis not present

## 2015-05-28 DIAGNOSIS — Z800 Family history of malignant neoplasm of digestive organs: Secondary | ICD-10-CM

## 2015-05-28 NOTE — Assessment & Plan Note (Signed)
Mammogram 11/20/14 - Birads I.  

## 2015-05-28 NOTE — Assessment & Plan Note (Signed)
Colonoscopy 03/07/13 - rectal polyp.   

## 2015-05-28 NOTE — Assessment & Plan Note (Signed)
Doing better.  Rarely takes trazodone.  Not needing now.  Follow.

## 2015-05-28 NOTE — Assessment & Plan Note (Signed)
Recent cholesterol check wnl.  Continue diet and exercise.  Follow.

## 2015-05-28 NOTE — Progress Notes (Signed)
Pre-visit discussion using our clinic review tool. No additional management support is needed unless otherwise documented below in the visit note.  

## 2015-05-28 NOTE — Assessment & Plan Note (Signed)
Blood pressure under good control.  Continue same medication regimen.  Follow pressures.  Follow metabolic panel.   

## 2015-05-28 NOTE — Assessment & Plan Note (Signed)
Last hgb checked through Dr Scharlene Gloss office 03/2015 - wnl.

## 2015-05-28 NOTE — Assessment & Plan Note (Signed)
On humira.  Flares intermittently.  Seeing Dr Jefm Bryant.

## 2015-05-28 NOTE — Assessment & Plan Note (Signed)
Manageable on current regimen.  Follow.

## 2015-05-28 NOTE — Progress Notes (Signed)
Patient ID: Tina Parker, female   DOB: [birth year], 44 y.o.   MRN: [id number]   Subjective:    Patient ID: Tina Parker, female    DOB: 1970/10/11, 44 y.o.   MRN: [id number]  HPI  Patient with past history of RA on Humira, hypertension, allergies, GERD and hypercholesterolemia.  She comes in today for a scheduled follow up of these issues.  She is doing a boot camp - exercising.  Adjusted her diet.  Has lost weight.  Feels better.  Allergy symptoms manageable on current regimen.  No sob.  No cough.  No chest pain or tightness.  No acid reflux or swallowing issues since adjusting her diet and losing weight.  No abdominal pain or cramping.  Bowels stable.  Blood pressure doing well.  Still with joint flares.  Seeing Dr Jefm Bryant.  On Humira.    Past Medical History  Diagnosis Date  . Dysphagia   . Pure hypercholesterolemia   . Anemia   . Hypertension   . Graves disease     remission, no ablation, positive medical treatment  . Allergic rhinitis   . PPD positive     hepatitis secondary to [place]  . Migraine headache   . Carpal tunnel syndrome   . Rheumatoid arthritis(714.0)     positive anti CCP antibodies, positive RF, oligo-articular, MTX  . GERD (gastroesophageal reflux disease)    Past Surgical History  Procedure Laterality Date  . Cesarean section  1996  . Post septoplasty and turbinate reduction  2007   Family History  Problem Relation Age of Onset  . Cancer Mother     Breast Cancer  . Breast cancer Mother 67  . Cancer Father     Colon Cancer  . Heart disease Father     Hx of MI  . Hypertension Father   . Diabetes Father   . Cancer Maternal Grandmother     lung cancer  . Cancer Maternal Uncle     esophageal   Social History   Social History  . Marital Status: Married    Spouse Name: N/A  . Number of Children: N/A  . Years of Education: N/A   Social History Main Topics  . Smoking status: Never Smoker   . Smokeless tobacco: Never Used  .  Alcohol Use: 0.0 oz/week    0 Standard drinks or equivalent per week     Comment: occasionally  . Drug Use: No  . Sexual Activity: Not Asked   Other Topics Concern  . None   Social History Narrative    Outpatient Encounter Prescriptions as of 05/28/2015  Medication Sig  . albuterol (PROVENTIL HFA;VENTOLIN HFA) 108 (90 BASE) MCG/ACT inhaler Inhale 2 puffs into the lungs every 6 (six) hours as needed for wheezing or shortness of breath.  Marland Kitchen amLODipine (NORVASC) 5 MG tablet Take 1 tablet (5 mg total) by mouth daily.  Marland Kitchen dexlansoprazole (DEXILANT) 60 MG capsule Take 60 mg by mouth daily.  . fexofenadine (ALLEGRA) 180 MG tablet Take 180 mg by mouth daily.  . folic acid (FOLVITE) 1 MG tablet Take 1 mg by mouth daily.  Marland Kitchen HUMIRA PEN 40 MG/0.8ML PNKT   . ibuprofen (ADVIL,MOTRIN) 800 MG tablet Take 800 mg by mouth 3 (three) times daily as needed. TID PRN for pain  . losartan (COZAAR) 50 MG tablet Take 1 tablet (50 mg total) by mouth daily.  . medroxyPROGESTERone (DEPO-PROVERA) 150 MG/ML injection Inject 150 mg into the muscle every 3 (three) months.  Marland Kitchen  methotrexate (RHEUMATREX) 2.5 MG tablet Take 2.5 mg by mouth once a week. Caution:Chemotherapy. Protect from light. Take 8 tabs po weekly with meals  . montelukast (SINGULAIR) 10 MG tablet Take 1 tablet (10 mg total) by mouth at bedtime.  . ranitidine (ZANTAC) 150 MG tablet Take 150 mg by mouth 2 (two) times daily.  . traZODone (DESYREL) 50 MG tablet Take 0.5-1 tablets (25-50 mg total) by mouth at bedtime as needed for sleep.  . [DISCONTINUED] hydrochlorothiazide (HYDRODIURIL) 25 MG tablet Take 1 tablet (25 mg total) by mouth daily.   No facility-administered encounter medications on file as of 05/28/2015.    Review of Systems  Constitutional:       Has adjusted her diet.  Lost weight.   HENT: Negative for sinus pressure.        Some allergy symptoms.   Eyes: Negative for pain and discharge.  Respiratory: Negative for cough, chest tightness  and shortness of breath.   Cardiovascular: Negative for chest pain, palpitations and leg swelling.  Gastrointestinal: Negative for nausea, vomiting, abdominal pain and diarrhea.  Genitourinary: Negative for dysuria and difficulty urinating.  Musculoskeletal: Negative for back pain.       Joint flares intermittently.    Skin: Negative for color change and rash.  Neurological: Negative for dizziness, light-headedness and headaches.  Psychiatric/Behavioral: Negative for dysphoric mood and agitation.       Objective:    Physical Exam  Constitutional: She appears well-developed and well-nourished. No distress.  HENT:  Nose: Nose normal.  Mouth/Throat: Oropharynx is clear and moist.  Eyes: Conjunctivae are normal. Right eye exhibits no discharge. Left eye exhibits no discharge.  Neck: Neck supple. No thyromegaly present.  Cardiovascular: Normal rate and regular rhythm.   Pulmonary/Chest: Breath sounds normal. No respiratory distress. She has no wheezes.  Abdominal: Soft. Bowel sounds are normal. There is no tenderness.  Musculoskeletal: She exhibits no edema or tenderness.  Lymphadenopathy:    She has no cervical adenopathy.  Skin: No rash noted. No erythema.  Psychiatric: She has a normal mood and affect. Her behavior is normal.    BP 120/80 mmHg  Pulse 79  Temp(Src) 98.1 F (36.7 C) (Oral)  Resp 18  Ht 5\' 1"  (1.549 m)  Wt 163 lb 8 oz (74.163 kg)  BMI 30.91 kg/m2  SpO2 98% Wt Readings from Last 3 Encounters:  05/28/15 163 lb 8 oz (74.163 kg)  02/27/15 172 lb 2 oz (78.075 kg)  01/05/15 178 lb (80.74 kg)     Lab Results  Component Value Date   WBC 8.5 10/18/2013   HGB 12.3 10/18/2013   HCT 36.8 10/18/2013   PLT 299.0 10/18/2013   GLUCOSE 83 03/13/2014   CHOL 177 03/06/2015   TRIG 90 03/06/2015   HDL 40 03/06/2015   LDLCALC 119 03/06/2015   ALT 22 03/13/2014   AST 19 03/13/2014   NA 137 03/13/2014   K 4.3 03/13/2014   CL 104 03/13/2014   CREATININE 1.0  03/13/2014   BUN 11 03/13/2014   CO2 26 03/13/2014   TSH 3.06 03/13/2014    Mm Digital Screening Bilateral  11/20/2014  CLINICAL DATA:  Screening. EXAM: DIGITAL SCREENING BILATERAL MAMMOGRAM WITH CAD COMPARISON:  Previous exam(s). ACR Breast Density Category c: The breast tissue is heterogeneously dense, which may obscure small masses. FINDINGS: There are no findings suspicious for malignancy. Images were processed with CAD. IMPRESSION: No mammographic evidence of malignancy. A result letter of this screening mammogram will be mailed directly to  the patient. RECOMMENDATION: Screening mammogram in one year. (Code:SM-B-01Y) BI-RADS CATEGORY  1: Negative. Electronically Signed   By: Claudie Revering M.D.   On: 11/20/2014 10:33       Assessment & Plan:   Problem List Items Addressed This Visit    Anemia    Last hgb checked through Dr Scharlene Gloss office 03/2015 - wnl.       Difficulty sleeping    Doing better.  Rarely takes trazodone.  Not needing now.  Follow.        Environmental allergies    Manageable on current regimen.  Follow.        Essential hypertension, benign - Primary    Blood pressure under good control.  Continue same medication regimen.  Follow pressures.  Follow metabolic panel.        Family history of breast cancer    Mammogram 11/20/14 - Birads I.       Family history of colon cancer    Colonoscopy 03/07/13 - rectal polyp.        GERD (gastroesophageal reflux disease)    EGD as outlined 03/07/13.  No swallowing issues or reflux.  Dong better after adjusting diet and losing weight.  Folllow.        Hypercholesterolemia    Recent cholesterol check wnl.  Continue diet and exercise.  Follow.        Rheumatoid arthritis ([place])    On humira.  Flares intermittently.  Seeing Dr Jefm Bryant.           Einar Pheasant, MD

## 2015-05-28 NOTE — Assessment & Plan Note (Signed)
EGD as outlined 03/07/13.  No swallowing issues or reflux.  Dong better after adjusting diet and losing weight.  Folllow.

## 2015-07-31 ENCOUNTER — Ambulatory Visit (INDEPENDENT_AMBULATORY_CARE_PROVIDER_SITE_OTHER): Payer: Commercial Managed Care - HMO

## 2015-07-31 ENCOUNTER — Ambulatory Visit: Payer: Commercial Managed Care - HMO

## 2015-07-31 DIAGNOSIS — Z3049 Encounter for surveillance of other contraceptives: Secondary | ICD-10-CM | POA: Diagnosis not present

## 2015-07-31 DIAGNOSIS — Z3042 Encounter for surveillance of injectable contraceptive: Secondary | ICD-10-CM

## 2015-07-31 MED ORDER — MEDROXYPROGESTERONE ACETATE 150 MG/ML IM SUSP
150.0000 mg | Freq: Once | INTRAMUSCULAR | Status: AC
  Administered 2015-07-31: 150 mg via INTRAMUSCULAR

## 2015-07-31 NOTE — Progress Notes (Signed)
Patient came in for Depo Provera.  Received in Left upper quadrant.  Patient tolerated well.    Gave patient sheet to schedule next visit.

## 2015-09-24 ENCOUNTER — Telehealth: Payer: Self-pay | Admitting: Internal Medicine

## 2015-09-24 NOTE — Telephone Encounter (Signed)
Pt was given her Depo shot January 31 and wants to know when she is supposed to comein for her next shot. Where should i foind out when she is due? Do we give them a card when they get their shot to let them know when their next shot is due? Please advise, thanks

## 2015-09-24 NOTE — Telephone Encounter (Signed)
Gave calendar to Triage CMA for reference.

## 2015-09-25 ENCOUNTER — Ambulatory Visit: Payer: Commercial Managed Care - HMO

## 2015-10-01 ENCOUNTER — Ambulatory Visit: Payer: Commercial Managed Care - HMO | Admitting: Internal Medicine

## 2015-10-01 ENCOUNTER — Encounter: Payer: Self-pay | Admitting: *Deleted

## 2015-10-01 DIAGNOSIS — Z0289 Encounter for other administrative examinations: Secondary | ICD-10-CM

## 2015-10-09 ENCOUNTER — Ambulatory Visit: Payer: Commercial Managed Care - HMO

## 2015-10-10 ENCOUNTER — Ambulatory Visit: Payer: Commercial Managed Care - HMO

## 2015-10-17 ENCOUNTER — Ambulatory Visit (INDEPENDENT_AMBULATORY_CARE_PROVIDER_SITE_OTHER): Payer: Commercial Managed Care - HMO | Admitting: Surgical

## 2015-10-17 DIAGNOSIS — Z302 Encounter for sterilization: Secondary | ICD-10-CM | POA: Diagnosis not present

## 2015-10-17 MED ORDER — MEDROXYPROGESTERONE ACETATE 150 MG/ML IM SUSP
150.0000 mg | Freq: Once | INTRAMUSCULAR | Status: AC
  Administered 2015-10-17: 150 mg via INTRAMUSCULAR

## 2015-10-17 NOTE — Progress Notes (Signed)
Patient came in today for medroxyprogesterone injection. Given right dorsal glutea. Patient tolerated well. Next injection due between July 5-19 2017

## 2015-10-17 NOTE — Addendum Note (Signed)
Addended by: Durwin Glaze on: 10/17/2015 11:34 AM   Modules accepted: Level of Service

## 2015-10-30 ENCOUNTER — Other Ambulatory Visit: Payer: Self-pay | Admitting: Internal Medicine

## 2015-10-30 DIAGNOSIS — Z1231 Encounter for screening mammogram for malignant neoplasm of breast: Secondary | ICD-10-CM

## 2015-11-12 ENCOUNTER — Encounter (INDEPENDENT_AMBULATORY_CARE_PROVIDER_SITE_OTHER): Payer: Self-pay

## 2015-11-12 ENCOUNTER — Encounter: Payer: Self-pay | Admitting: Internal Medicine

## 2015-11-12 ENCOUNTER — Ambulatory Visit (INDEPENDENT_AMBULATORY_CARE_PROVIDER_SITE_OTHER): Payer: Commercial Managed Care - HMO | Admitting: Internal Medicine

## 2015-11-12 VITALS — BP 122/80 | HR 74 | Temp 98.8°F | Resp 18 | Ht 61.0 in | Wt 159.5 lb

## 2015-11-12 DIAGNOSIS — Z800 Family history of malignant neoplasm of digestive organs: Secondary | ICD-10-CM

## 2015-11-12 DIAGNOSIS — I10 Essential (primary) hypertension: Secondary | ICD-10-CM | POA: Diagnosis not present

## 2015-11-12 DIAGNOSIS — M059 Rheumatoid arthritis with rheumatoid factor, unspecified: Secondary | ICD-10-CM | POA: Diagnosis not present

## 2015-11-12 DIAGNOSIS — Z9109 Other allergy status, other than to drugs and biological substances: Secondary | ICD-10-CM

## 2015-11-12 DIAGNOSIS — Z803 Family history of malignant neoplasm of breast: Secondary | ICD-10-CM

## 2015-11-12 DIAGNOSIS — K219 Gastro-esophageal reflux disease without esophagitis: Secondary | ICD-10-CM | POA: Diagnosis not present

## 2015-11-12 DIAGNOSIS — E7800 Pure hypercholesterolemia, unspecified: Secondary | ICD-10-CM | POA: Diagnosis not present

## 2015-11-12 DIAGNOSIS — Z91048 Other nonmedicinal substance allergy status: Secondary | ICD-10-CM

## 2015-11-12 NOTE — Progress Notes (Signed)
Pre-visit discussion using our clinic review tool. No additional management support is needed unless otherwise documented below in the visit note.  

## 2015-11-12 NOTE — Progress Notes (Signed)
Patient ID: Tina Parker, female   DOB: [birth year], 45 y.o.   MRN: [id number]   Subjective:    Patient ID: Tina Parker, female    DOB: April 23, 1971, 45 y.o.   MRN: [id number]  HPI  Patient here for a scheduled follow up.  She is doing well.  Still going to boot camp.  Exercising regularly.  Has lost more weight.  No chest pain.  No sob.  Still with allergy issues.  Right ear feels full at times.  Using antihistamine.  Using nasal sprays.  Discussed afrin usage.  Discussed referral back to ENT for evaluation.  States does not have time for allergy injections.  On orencia now.  Joints better.  S/p injection - base of right thumb.  Is better.  Overall feels good.  Bowels stable.  No significant acid reflux.  No swallowing issues.     Past Medical History  Diagnosis Date  . Dysphagia   . Pure hypercholesterolemia   . Anemia   . Hypertension   . Graves disease     remission, no ablation, positive medical treatment  . Allergic rhinitis   . PPD positive     hepatitis secondary to [place]  . Migraine headache   . Carpal tunnel syndrome   . Rheumatoid arthritis(714.0)     positive anti CCP antibodies, positive RF, oligo-articular, MTX  . GERD (gastroesophageal reflux disease)    Past Surgical History  Procedure Laterality Date  . Cesarean section  1996  . Post septoplasty and turbinate reduction  2007   Family History  Problem Relation Age of Onset  . Cancer Mother     Breast Cancer  . Breast cancer Mother 50  . Cancer Father     Colon Cancer  . Heart disease Father     Hx of MI  . Hypertension Father   . Diabetes Father   . Cancer Maternal Grandmother     lung cancer  . Cancer Maternal Uncle     esophageal   Social History   Social History  . Marital Status: Married    Spouse Name: N/A  . Number of Children: N/A  . Years of Education: N/A   Social History Main Topics  . Smoking status: Never Smoker   . Smokeless tobacco: Never Used  . Alcohol Use: 0.0  oz/week    0 Standard drinks or equivalent per week     Comment: occasionally  . Drug Use: No  . Sexual Activity: Not Asked   Other Topics Concern  . None   Social History Narrative    Outpatient Encounter Prescriptions as of 11/12/2015  Medication Sig  . albuterol (PROVENTIL HFA;VENTOLIN HFA) 108 (90 BASE) MCG/ACT inhaler Inhale 2 puffs into the lungs every 6 (six) hours as needed for wheezing or shortness of breath.  Marland Kitchen amLODipine (NORVASC) 5 MG tablet Take 1 tablet (5 mg total) by mouth daily.  Marland Kitchen dexlansoprazole (DEXILANT) 60 MG capsule Take 60 mg by mouth daily.  . fexofenadine (ALLEGRA) 180 MG tablet Take 180 mg by mouth daily.  . folic acid (FOLVITE) 1 MG tablet Take 1 mg by mouth daily.  Marland Kitchen ibuprofen (ADVIL,MOTRIN) 800 MG tablet Take 800 mg by mouth 3 (three) times daily as needed. TID PRN for pain  . losartan (COZAAR) 50 MG tablet Take 1 tablet (50 mg total) by mouth daily.  . medroxyPROGESTERone (DEPO-PROVERA) 150 MG/ML injection Inject 150 mg into the muscle every 3 (three) months.  . methotrexate (RHEUMATREX) 2.5 MG tablet  Take 2.5 mg by mouth once a week. Caution:Chemotherapy. Protect from light. Take 8 tabs po weekly with meals  . montelukast (SINGULAIR) 10 MG tablet Take 1 tablet (10 mg total) by mouth at bedtime.  Marland Kitchen ORENCIA 125 MG/ML SOSY   . ranitidine (ZANTAC) 150 MG tablet Take 150 mg by mouth 2 (two) times daily.  . traZODone (DESYREL) 50 MG tablet Take 0.5-1 tablets (25-50 mg total) by mouth at bedtime as needed for sleep.  . [DISCONTINUED] HUMIRA PEN 40 MG/0.8ML PNKT    No facility-administered encounter medications on file as of 11/12/2015.    Review of Systems  Constitutional: Negative for appetite change and unexpected weight change.  HENT: Positive for congestion and postnasal drip.        Right ear fullness as outlined.   Respiratory: Negative for cough, chest tightness and shortness of breath.   Cardiovascular: Negative for chest pain, palpitations and leg  swelling.  Gastrointestinal: Negative for nausea, vomiting, abdominal pain and diarrhea.  Genitourinary: Negative for dysuria and difficulty urinating.  Musculoskeletal: Negative for back pain.       Right thumb better.  Joints better.   Skin: Negative for color change and rash.  Neurological: Negative for dizziness, light-headedness and headaches.  Psychiatric/Behavioral: Negative for dysphoric mood and agitation.       Objective:    Physical Exam  Constitutional: She appears well-developed and well-nourished. No distress.  HENT:  Nose: Nose normal.  Mouth/Throat: Oropharynx is clear and moist.  TMs no erythema.    Neck: Neck supple. No thyromegaly present.  Cardiovascular: Normal rate and regular rhythm.   Pulmonary/Chest: Breath sounds normal. No respiratory distress. She has no wheezes.  Abdominal: Soft. Bowel sounds are normal. There is no tenderness.  Musculoskeletal: She exhibits no edema or tenderness.  Lymphadenopathy:    She has no cervical adenopathy.  Skin: No rash noted. No erythema.  Psychiatric: She has a normal mood and affect. Her behavior is normal.    BP 122/80 mmHg  Pulse 74  Temp(Src) 98.8 F (37.1 C) (Oral)  Resp 18  Ht 5\' 1"  (1.549 m)  Wt 159 lb 8 oz (72.349 kg)  BMI 30.15 kg/m2  SpO2 99% Wt Readings from Last 3 Encounters:  11/12/15 159 lb 8 oz (72.349 kg)  05/28/15 163 lb 8 oz (74.163 kg)  02/27/15 172 lb 2 oz (78.075 kg)     Lab Results  Component Value Date   WBC 8.5 10/18/2013   HGB 12.3 10/18/2013   HCT 36.8 10/18/2013   PLT 299.0 10/18/2013   GLUCOSE 83 03/13/2014   CHOL 177 03/06/2015   TRIG 90 03/06/2015   HDL 40 03/06/2015   LDLCALC 119 03/06/2015   ALT 22 03/13/2014   AST 19 03/13/2014   NA 137 03/13/2014   K 4.3 03/13/2014   CL 104 03/13/2014   CREATININE 1.0 03/13/2014   BUN 11 03/13/2014   CO2 26 03/13/2014   TSH 3.06 03/13/2014    Mm Digital Screening Bilateral  11/20/2014  CLINICAL DATA:  Screening. EXAM:  DIGITAL SCREENING BILATERAL MAMMOGRAM WITH CAD COMPARISON:  Previous exam(s). ACR Breast Density Category c: The breast tissue is heterogeneously dense, which may obscure small masses. FINDINGS: There are no findings suspicious for malignancy. Images were processed with CAD. IMPRESSION: No mammographic evidence of malignancy. A result letter of this screening mammogram will be mailed directly to the patient. RECOMMENDATION: Screening mammogram in one year. (Code:SM-B-01Y) BI-RADS CATEGORY  1: Negative. Electronically Signed   By: Remo Lipps  Joneen Caraway M.D.   On: 11/20/2014 10:33       Assessment & Plan:   Problem List Items Addressed This Visit    Environmental allergies    Persistent issues.  Worse at times.  Has seen ENT.  States does not have time for allergy shots.  Continue current regimen.  Manageable.        Essential hypertension, benign - Primary    Blood pressure under good control.  Continue same medication regimen.  Follow pressures.  Follow metabolic panel.        Family history of breast cancer    Mammogram 11/20/14 - Birads I.  Schedule f/u mammogram.       Family history of colon cancer    Colonoscopy 03/07/13 - rectal polyp.       GERD (gastroesophageal reflux disease)    On dexilant.  Doing well.  Follow.  Upper symptoms controlled.        Hypercholesterolemia    Low cholesterol diet and exercise.  Follow lipid panel.  She has adjusted diet and lost weight.       Rheumatoid arthritis ([place])    On orecnia.  Doing well.  Followed by rheumatology.       Relevant Medications   ORENCIA 125 MG/ML Carlynn Spry, MD

## 2015-11-25 NOTE — Assessment & Plan Note (Signed)
Persistent issues.  Worse at times.  Has seen ENT.  States does not have time for allergy shots.  Continue current regimen.  Manageable.

## 2015-11-25 NOTE — Assessment & Plan Note (Signed)
Mammogram 11/20/14 - Birads I.  Schedule f/u mammogram.

## 2015-11-25 NOTE — Assessment & Plan Note (Signed)
Low cholesterol diet and exercise.  Follow lipid panel.  She has adjusted diet and lost weight.

## 2015-11-25 NOTE — Assessment & Plan Note (Signed)
Blood pressure under good control.  Continue same medication regimen.  Follow pressures.  Follow metabolic panel.   

## 2015-11-25 NOTE — Assessment & Plan Note (Signed)
On orecnia.  Doing well.  Followed by rheumatology.

## 2015-11-25 NOTE — Assessment & Plan Note (Signed)
On dexilant.  Doing well.  Follow.  Upper symptoms controlled.

## 2015-11-25 NOTE — Assessment & Plan Note (Signed)
Colonoscopy 03/07/13 - rectal polyp.   

## 2015-12-03 ENCOUNTER — Ambulatory Visit
Admission: RE | Admit: 2015-12-03 | Discharge: 2015-12-03 | Disposition: A | Discharge status: Home / Self Care | Payer: Commercial Managed Care - HMO | Source: Ambulatory Visit | Attending: Internal Medicine | Admitting: Internal Medicine

## 2015-12-03 DIAGNOSIS — Z1231 Encounter for screening mammogram for malignant neoplasm of breast: Secondary | ICD-10-CM | POA: Insufficient documentation

## 2015-12-26 ENCOUNTER — Other Ambulatory Visit: Payer: Self-pay | Admitting: Internal Medicine

## 2016-01-08 ENCOUNTER — Ambulatory Visit (INDEPENDENT_AMBULATORY_CARE_PROVIDER_SITE_OTHER): Payer: Commercial Managed Care - HMO

## 2016-01-08 DIAGNOSIS — Z302 Encounter for sterilization: Secondary | ICD-10-CM | POA: Diagnosis not present

## 2016-01-08 MED ORDER — MEDROXYPROGESTERONE ACETATE 150 MG/ML IM SUSP
150.0000 mg | Freq: Once | INTRAMUSCULAR | Status: AC
  Administered 2016-01-08: 150 mg via INTRAMUSCULAR

## 2016-01-08 NOTE — Progress Notes (Signed)
Patient is in receiving a Medroxyprogesterone injection in the left dorsal gluteal muscle. Patient tolerated well.

## 2016-02-18 ENCOUNTER — Other Ambulatory Visit: Payer: Self-pay | Admitting: Family Medicine

## 2016-02-18 DIAGNOSIS — M25562 Pain in left knee: Secondary | ICD-10-CM

## 2016-02-19 ENCOUNTER — Ambulatory Visit
Admission: RE | Admit: 2016-02-19 | Discharge: 2016-02-19 | Disposition: A | Discharge status: Home / Self Care | Payer: Commercial Managed Care - HMO | Source: Ambulatory Visit | Attending: Family Medicine | Admitting: Family Medicine

## 2016-02-19 DIAGNOSIS — M25562 Pain in left knee: Secondary | ICD-10-CM | POA: Insufficient documentation

## 2016-02-19 DIAGNOSIS — M94262 Chondromalacia, left knee: Secondary | ICD-10-CM | POA: Insufficient documentation

## 2016-02-19 DIAGNOSIS — M67864 Other specified disorders of tendon, left knee: Secondary | ICD-10-CM | POA: Insufficient documentation

## 2016-03-17 ENCOUNTER — Encounter: Payer: Self-pay | Admitting: Internal Medicine

## 2016-03-17 ENCOUNTER — Ambulatory Visit (INDEPENDENT_AMBULATORY_CARE_PROVIDER_SITE_OTHER): Payer: Commercial Managed Care - HMO | Admitting: Internal Medicine

## 2016-03-17 ENCOUNTER — Encounter (INDEPENDENT_AMBULATORY_CARE_PROVIDER_SITE_OTHER): Payer: Self-pay

## 2016-03-17 VITALS — BP 120/60 | HR 85 | Temp 98.3°F | Ht 62.0 in | Wt 159.1 lb

## 2016-03-17 DIAGNOSIS — E7800 Pure hypercholesterolemia, unspecified: Secondary | ICD-10-CM | POA: Diagnosis not present

## 2016-03-17 DIAGNOSIS — K219 Gastro-esophageal reflux disease without esophagitis: Secondary | ICD-10-CM | POA: Diagnosis not present

## 2016-03-17 DIAGNOSIS — Z91048 Other nonmedicinal substance allergy status: Secondary | ICD-10-CM

## 2016-03-17 DIAGNOSIS — E039 Hypothyroidism, unspecified: Secondary | ICD-10-CM

## 2016-03-17 DIAGNOSIS — D649 Anemia, unspecified: Secondary | ICD-10-CM

## 2016-03-17 DIAGNOSIS — M059 Rheumatoid arthritis with rheumatoid factor, unspecified: Secondary | ICD-10-CM

## 2016-03-17 DIAGNOSIS — I10 Essential (primary) hypertension: Secondary | ICD-10-CM | POA: Diagnosis not present

## 2016-03-17 DIAGNOSIS — Z0000 Encounter for general adult medical examination without abnormal findings: Secondary | ICD-10-CM

## 2016-03-17 DIAGNOSIS — Z9109 Other allergy status, other than to drugs and biological substances: Secondary | ICD-10-CM

## 2016-03-17 DIAGNOSIS — Z23 Encounter for immunization: Secondary | ICD-10-CM

## 2016-03-17 LAB — HEPATIC FUNCTION PANEL
ALT: 18 U/L (ref 0–35)
AST: 19 U/L (ref 0–37)
Albumin: 3.8 g/dL (ref 3.5–5.2)
Alkaline Phosphatase: 83 U/L (ref 39–117)
Bilirubin, Direct: 0.1 mg/dL (ref 0.0–0.3)
Total Bilirubin: 0.5 mg/dL (ref 0.2–1.2)
Total Protein: 7.5 g/dL (ref 6.0–8.3)

## 2016-03-17 LAB — BASIC METABOLIC PANEL
BUN: 16 mg/dL (ref 6–23)
CO2: 28 mEq/L (ref 19–32)
Calcium: 9.2 mg/dL (ref 8.4–10.5)
Chloride: 105 mEq/L (ref 96–112)
Creatinine, Ser: 0.93 mg/dL (ref 0.40–1.20)
GFR: 83.11 mL/min (ref >60.00)
Glucose, Bld: 90 mg/dL (ref 70–99)
Potassium: 4 mEq/L (ref 3.5–5.1)
Sodium: 138 mEq/L (ref 135–145)

## 2016-03-17 LAB — LIPID PANEL
Cholesterol: 177 mg/dL (ref 0–200)
HDL: 58.3 mg/dL (ref >39.00)
LDL Cholesterol: 104 mg/dL — ABNORMAL HIGH (ref 0–99)
NonHDL: 119.03
Total CHOL/HDL Ratio: 3
Triglycerides: 74 mg/dL (ref 0.0–149.0)
VLDL: 14.8 mg/dL (ref 0.0–40.0)

## 2016-03-17 LAB — TSH: TSH: 1.71 u[IU]/mL (ref 0.35–4.50)

## 2016-03-17 MED ORDER — MONTELUKAST SODIUM 10 MG PO TABS: 10.0000 mg | ORAL_TABLET | Freq: Every day | ORAL | 5 refills | Status: DC

## 2016-03-17 NOTE — Assessment & Plan Note (Signed)
Followed by Dr Jefm Bryant.  On MTX and orencia.  Follow.  Stable.

## 2016-03-17 NOTE — Assessment & Plan Note (Signed)
Had EGD as outlined 03/07/13.  Has noticed a little more fullness in her throat and some trouble swallowing meat and bread.  Discussed the need to chew food well and eat slowly.  May need swallowing evaluation and/or referral back to GI.  She wants to check thyroid first.

## 2016-03-17 NOTE — Assessment & Plan Note (Signed)
Recently worsened. - last week.  Took her usual medications and added mucinex.  Better now.  Follow.

## 2016-03-17 NOTE — Progress Notes (Signed)
Pre visit review using our clinic review tool, if applicable. No additional management support is needed unless otherwise documented below in the visit note. 

## 2016-03-17 NOTE — Assessment & Plan Note (Signed)
Blood pressure under good control.  Continue same medication regimen.  Follow pressures.  Follow metabolic panel.   

## 2016-03-17 NOTE — Progress Notes (Signed)
Patient ID: Tina Parker, female   DOB: [birth year], 45 y.o.   MRN: [id number]   Subjective:    Patient ID: Tina Parker, female    DOB: 08-15-1970, 45 y.o.   MRN: [id number]  HPI  Patient here for her physical exam.  Has been having persistent left knee pain.  Seeing ortho.  Third week of PT.  She is able to ride a bike.  No chest pain.  No sob.  No acid reflux.  Has noticed a little more pressure - neck. Some constipation.  Taking dulcolax.  Helps.  Some nights does not sleep well.     Past Medical History:  Diagnosis Date  . Allergic rhinitis   . Anemia   . Carpal tunnel syndrome   . Dysphagia   . GERD (gastroesophageal reflux disease)   . Graves disease    remission, no ablation, positive medical treatment  . Hypertension   . Migraine headache   . PPD positive    hepatitis secondary to [place]  . Pure hypercholesterolemia   . Rheumatoid arthritis(714.0)    positive anti CCP antibodies, positive RF, oligo-articular, MTX   Past Surgical History:  Procedure Laterality Date  . CESAREAN SECTION  1996  . Post Septoplasty and turbinate reduction  2007   Family History  Problem Relation Age of Onset  . Cancer Mother     Breast Cancer  . Breast cancer Mother 63  . Cancer Father     Colon Cancer  . Heart disease Father     Hx of MI  . Hypertension Father   . Diabetes Father   . Cancer Maternal Grandmother     lung cancer  . Cancer Maternal Uncle     esophageal   Social History   Social History  . Marital status: Married    Spouse name: N/A  . Number of children: N/A  . Years of education: N/A   Social History Main Topics  . Smoking status: Never Smoker  . Smokeless tobacco: Never Used  . Alcohol use 0.0 oz/week     Comment: occasionally  . Drug use: No  . Sexual activity: Not Asked   Other Topics Concern  . None   Social History Narrative  . None    Outpatient Encounter Prescriptions as of 03/17/2016  Medication Sig  . albuterol (PROVENTIL  HFA;VENTOLIN HFA) 108 (90 BASE) MCG/ACT inhaler Inhale 2 puffs into the lungs every 6 (six) hours as needed for wheezing or shortness of breath.  Marland Kitchen amLODipine (NORVASC) 5 MG tablet take 1 tablet by mouth once daily  . dexlansoprazole (DEXILANT) 60 MG capsule Take 60 mg by mouth daily.  . fexofenadine (ALLEGRA) 180 MG tablet Take 180 mg by mouth daily.  . folic acid (FOLVITE) 1 MG tablet Take 1 mg by mouth daily.  Marland Kitchen ibuprofen (ADVIL,MOTRIN) 800 MG tablet Take 800 mg by mouth 3 (three) times daily as needed. TID PRN for pain  . losartan (COZAAR) 50 MG tablet take 1 tablet by mouth once daily  . medroxyPROGESTERone (DEPO-PROVERA) 150 MG/ML injection Inject 150 mg into the muscle every 3 (three) months.  . methotrexate (RHEUMATREX) 2.5 MG tablet Take 2.5 mg by mouth once a week. Caution:Chemotherapy. Protect from light. Take 8 tabs po weekly with meals  . montelukast (SINGULAIR) 10 MG tablet Take 1 tablet (10 mg total) by mouth at bedtime.  Marland Kitchen ORENCIA 125 MG/ML SOSY   . ranitidine (ZANTAC) 150 MG tablet Take 150 mg by mouth 2 (two) times  daily.  . traZODone (DESYREL) 50 MG tablet Take 0.5-1 tablets (25-50 mg total) by mouth at bedtime as needed for sleep.  . [DISCONTINUED] montelukast (SINGULAIR) 10 MG tablet Take 1 tablet (10 mg total) by mouth at bedtime.   No facility-administered encounter medications on file as of 03/17/2016.     Review of Systems  Constitutional: Negative for appetite change and unexpected weight change.  HENT: Negative for congestion and sinus pressure.        Allergy symptoms controlled.    Eyes: Negative for pain and visual disturbance.  Respiratory: Negative for cough, chest tightness and shortness of breath.   Cardiovascular: Negative for chest pain, palpitations and leg swelling.  Gastrointestinal: Positive for constipation. Negative for abdominal pain, diarrhea, nausea and vomiting.  Genitourinary: Negative for difficulty urinating and dysuria.  Musculoskeletal:  Negative for back pain and joint swelling.  Skin: Negative for color change and rash.  Neurological: Negative for dizziness, light-headedness and headaches.  Hematological: Negative for adenopathy. Does not bruise/bleed easily.  Psychiatric/Behavioral: Negative for agitation and dysphoric mood.       Objective:     Blood pressure rechecked by me:  128/76  Physical Exam  Constitutional: She is oriented to person, place, and time. She appears well-developed and well-nourished. No distress.  HENT:  Nose: Nose normal.  Mouth/Throat: Oropharynx is clear and moist.  Eyes: Right eye exhibits no discharge. Left eye exhibits no discharge. No scleral icterus.  Neck: Neck supple.  Neck fullness - unchanged on exam.    Cardiovascular: Normal rate and regular rhythm.   Pulmonary/Chest: Breath sounds normal. No accessory muscle usage. No tachypnea. No respiratory distress. She has no decreased breath sounds. She has no wheezes. She has no rhonchi. Right breast exhibits no inverted nipple, no mass, no nipple discharge and no tenderness (no axillary adenopathy). Left breast exhibits no inverted nipple, no mass, no nipple discharge and no tenderness (no axilarry adenopathy).  Abdominal: Soft. Bowel sounds are normal. There is no tenderness.  Musculoskeletal: She exhibits no edema or tenderness.  Lymphadenopathy:    She has no cervical adenopathy.  Neurological: She is alert and oriented to person, place, and time.  Skin: Skin is warm. No rash noted. No erythema.  Psychiatric: She has a normal mood and affect. Her behavior is normal.    BP 120/60   Pulse 85   Temp 98.3 F (36.8 C) (Oral)   Ht 5\' 2"  (1.575 m)   Wt 159 lb 1.9 oz (72.2 kg)   SpO2 98%   BMI 29.10 kg/m  Wt Readings from Last 3 Encounters:  03/17/16 159 lb 1.9 oz (72.2 kg)  11/12/15 159 lb 8 oz (72.3 kg)  05/28/15 163 lb 8 oz (74.2 kg)     Lab Results  Component Value Date   WBC 8.5 10/18/2013   HGB 12.3 10/18/2013   HCT  36.8 10/18/2013   PLT 299.0 10/18/2013   GLUCOSE 90 03/17/2016   CHOL 177 03/17/2016   TRIG 74.0 03/17/2016   HDL 58.30 03/17/2016   LDLCALC 104 (H) 03/17/2016   ALT 18 03/17/2016   AST 19 03/17/2016   NA 138 03/17/2016   K 4.0 03/17/2016   CL 105 03/17/2016   CREATININE 0.93 03/17/2016   BUN 16 03/17/2016   CO2 28 03/17/2016   TSH 1.71 03/17/2016    Mr Knee Left  Wo Contrast  Result Date: 02/19/2016 CLINICAL DATA:  Posterior inferior patellar pain for 2-3 weeks. EXAM: MRI OF THE LEFT KNEE WITHOUT  CONTRAST TECHNIQUE: Multiplanar, multisequence MR imaging of the knee was performed. No intravenous contrast was administered. COMPARISON:  None. FINDINGS: MENISCI Medial meniscus: Vertical signal abnormality in the posterior meniscocapsular junction of the medial meniscus which may reflect prior injury without complete separation. Otherwise intact medial meniscus. Lateral meniscus:  Intact. LIGAMENTS Cruciates:  Intact ACL and PCL. Collaterals: Medial collateral ligament is intact. Lateral collateral ligament complex is intact. CARTILAGE Patellofemoral:  No chondral defect. Medial:  No chondral defect. Lateral:  Chondromalacia of the lateral tibial plateau. Joint: No joint effusion. No plical thickening. Minimal edema in Hoffa's fat. Popliteal Fossa:  Tiny Baker cyst.  Intact popliteus tendon. Extensor Mechanism: Intact quadriceps tendon. Mild tendinosis of the proximal patellar tendon. Bones:  No marrow signal abnormality.  No fracture or dislocation. Other: None IMPRESSION: 1. Mild tendinosis of the proximal patellar tendon. 2. Vertical signal abnormality in the posterior meniscocapsular junction of the medial meniscus which may reflect prior injury without complete separation. Otherwise intact medial meniscus. 3. Mild chondromalacia of the lateral tibial plateau. Electronically Signed   By: Kathreen Devoid   On: 02/19/2016 12:23       Assessment & Plan:   Problem List Items Addressed This Visit     Anemia    Follow cbc.        Environmental allergies    Recently worsened. - last week.  Took her usual medications and added mucinex.  Better now.  Follow.        Essential hypertension, benign    Blood pressure under good control.  Continue same medication regimen.  Follow pressures.  Follow metabolic panel.        Relevant Orders   TSH (Completed)   GERD (gastroesophageal reflux disease)    Had EGD as outlined 03/07/13.  Has noticed a little more fullness in her throat and some trouble swallowing meat and bread.  Discussed the need to chew food well and eat slowly.  May need swallowing evaluation and/or referral back to GI.  She wants to check thyroid first.        Health care maintenance    Physical today 03/17/16.  Mammogram 12/03/15 - Birads I.  Colonoscopy 03/07/13 - rectal polyp.        Hypercholesterolemia    Low cholesterol diet and exercise.  Follow lipid panel.        Relevant Orders   Lipid panel (Completed)   Hypothyroidism    On thyroid replacement.  Follow tsh.       Rheumatoid arthritis ([place])    Followed by Dr Jefm Bryant.  On MTX and orencia.  Follow.  Stable.       Relevant Orders   Hepatic function panel (Completed)   Basic metabolic panel (Completed)    Other Visit Diagnoses    Encounter for immunization       Relevant Orders   Flu Vaccine QUAD 36+ mos IM (Completed)       Einar Pheasant, MD

## 2016-03-17 NOTE — Assessment & Plan Note (Signed)
Physical today 03/17/16.  Mammogram 12/03/15 - Birads I.  Colonoscopy 03/07/13 - rectal polyp.

## 2016-03-17 NOTE — Assessment & Plan Note (Signed)
Follow cbc.  

## 2016-03-17 NOTE — Assessment & Plan Note (Signed)
Low cholesterol diet and exercise.  Follow lipid panel.   

## 2016-03-22 ENCOUNTER — Encounter: Payer: Self-pay | Admitting: Internal Medicine

## 2016-03-22 NOTE — Assessment & Plan Note (Signed)
On thyroid replacement.  Follow tsh.  

## 2016-03-25 ENCOUNTER — Ambulatory Visit: Payer: Commercial Managed Care - HMO

## 2016-03-26 ENCOUNTER — Ambulatory Visit (INDEPENDENT_AMBULATORY_CARE_PROVIDER_SITE_OTHER): Payer: Commercial Managed Care - HMO

## 2016-03-26 DIAGNOSIS — Z309 Encounter for contraceptive management, unspecified: Secondary | ICD-10-CM | POA: Diagnosis not present

## 2016-03-26 MED ORDER — MEDROXYPROGESTERONE ACETATE 150 MG/ML IM SUSP
150.0000 mg | Freq: Once | INTRAMUSCULAR | Status: AC
  Administered 2016-03-26: 150 mg via INTRAMUSCULAR

## 2016-03-26 NOTE — Progress Notes (Addendum)
Patient came in for depo shot for birthcontrol. Patient was given shot in left deltoid. Patient tolerated injection very well. Patient had no questions, comments, or concerns at this time.  Reviewed.  Dr Nicki Reaper

## 2016-06-09 ENCOUNTER — Ambulatory Visit: Payer: Commercial Managed Care - HMO | Admitting: Internal Medicine

## 2016-06-16 ENCOUNTER — Ambulatory Visit (INDEPENDENT_AMBULATORY_CARE_PROVIDER_SITE_OTHER): Payer: Commercial Managed Care - HMO | Admitting: Internal Medicine

## 2016-06-16 ENCOUNTER — Encounter: Payer: Self-pay | Admitting: Internal Medicine

## 2016-06-16 VITALS — BP 118/78 | HR 102 | Temp 98.2°F | Ht 62.0 in | Wt 163.4 lb

## 2016-06-16 DIAGNOSIS — Z302 Encounter for sterilization: Secondary | ICD-10-CM

## 2016-06-16 DIAGNOSIS — K219 Gastro-esophageal reflux disease without esophagitis: Secondary | ICD-10-CM

## 2016-06-16 DIAGNOSIS — E669 Obesity, unspecified: Secondary | ICD-10-CM

## 2016-06-16 DIAGNOSIS — Z9109 Other allergy status, other than to drugs and biological substances: Secondary | ICD-10-CM

## 2016-06-16 DIAGNOSIS — I10 Essential (primary) hypertension: Secondary | ICD-10-CM

## 2016-06-16 DIAGNOSIS — E7800 Pure hypercholesterolemia, unspecified: Secondary | ICD-10-CM

## 2016-06-16 DIAGNOSIS — M059 Rheumatoid arthritis with rheumatoid factor, unspecified: Secondary | ICD-10-CM

## 2016-06-16 MED ORDER — MEDROXYPROGESTERONE ACETATE 150 MG/ML IM SUSP
150.0000 mg | Freq: Once | INTRAMUSCULAR | Status: AC
  Administered 2016-06-16: 150 mg via INTRAMUSCULAR

## 2016-06-16 NOTE — Progress Notes (Signed)
Patient ID: Tina Parker, female   DOB: [birth year], 45 y.o.   MRN: [id number]   Subjective:    Patient ID: Tina Parker, female    DOB: 1971-05-25, 45 y.o.   MRN: [id number]  HPI  Patient here for a scheduled follow up.  Seeing Dr Amedeo Plenty.  Planning to have hand surgery 12/08/06/15.  She denies any chest pain.  No sob.  Was doing boot camp.  Not able to exercise as much now with her knee pain.  Saw ortho.  Meniscus tear.  Going to therapy.  She feels her breathing is stable.  She has had some increased acid reflux.  Increased her zantac to bid.  Has helped.  Improves if watches what she eats.  No abdominal pain or cramping.  Bowels stable.     Past Medical History:  Diagnosis Date  . Allergic rhinitis   . Anemia   . Carpal tunnel syndrome   . Dysphagia   . GERD (gastroesophageal reflux disease)   . Graves disease    remission, no ablation, positive medical treatment  . Hypertension   . Migraine headache   . PPD positive    hepatitis secondary to [place]  . Pure hypercholesterolemia   . Rheumatoid arthritis(714.0)    positive anti CCP antibodies, positive RF, oligo-articular, MTX   Past Surgical History:  Procedure Laterality Date  . CESAREAN SECTION  1996  . Post Septoplasty and turbinate reduction  2007   Family History  Problem Relation Age of Onset  . Cancer Mother     Breast Cancer  . Breast cancer Mother 72  . Cancer Father     Colon Cancer  . Heart disease Father     Hx of MI  . Hypertension Father   . Diabetes Father   . Cancer Maternal Grandmother     lung cancer  . Cancer Maternal Uncle     esophageal   Social History   Social History  . Marital status: Married    Spouse name: N/A  . Number of children: N/A  . Years of education: N/A   Social History Main Topics  . Smoking status: Never Smoker  . Smokeless tobacco: Never Used  . Alcohol use 0.0 oz/week     Comment: occasionally  . Drug use: No  . Sexual activity: Not Asked   Other Topics Concern   . None   Social History Narrative  . None    Outpatient Encounter Prescriptions as of 06/16/2016  Medication Sig  . albuterol (PROVENTIL HFA;VENTOLIN HFA) 108 (90 BASE) MCG/ACT inhaler Inhale 2 puffs into the lungs every 6 (six) hours as needed for wheezing or shortness of breath.  Marland Kitchen amLODipine (NORVASC) 5 MG tablet take 1 tablet by mouth once daily  . dexlansoprazole (DEXILANT) 60 MG capsule Take 60 mg by mouth daily.  . fexofenadine (ALLEGRA) 180 MG tablet Take 180 mg by mouth daily.  . folic acid (FOLVITE) 1 MG tablet Take 1 mg by mouth daily.  Marland Kitchen ibuprofen (ADVIL,MOTRIN) 800 MG tablet Take 800 mg by mouth 3 (three) times daily as needed. TID PRN for pain  . losartan (COZAAR) 50 MG tablet take 1 tablet by mouth once daily  . medroxyPROGESTERone (DEPO-PROVERA) 150 MG/ML injection Inject 150 mg into the muscle every 3 (three) months.  . methotrexate (RHEUMATREX) 2.5 MG tablet Take 2.5 mg by mouth once a week. Caution:Chemotherapy. Protect from light. Take 8 tabs po weekly with meals  . montelukast (SINGULAIR) 10 MG tablet Take 1  tablet (10 mg total) by mouth at bedtime.  Marland Kitchen ORENCIA 125 MG/ML SOSY   . ranitidine (ZANTAC) 150 MG tablet Take 150 mg by mouth 2 (two) times daily.  . traZODone (DESYREL) 50 MG tablet Take 0.5-1 tablets (25-50 mg total) by mouth at bedtime as needed for sleep.  . [EXPIRED] medroxyPROGESTERone (DEPO-PROVERA) injection 150 mg    No facility-administered encounter medications on file as of 06/16/2016.     Review of Systems  Constitutional: Negative for appetite change and unexpected weight change.  HENT: Negative for sinus pain.        No increased congestion.    Respiratory: Negative for cough, chest tightness and shortness of breath.   Cardiovascular: Negative for chest pain, palpitations and leg swelling.  Gastrointestinal: Negative for abdominal pain, diarrhea, nausea and vomiting.  Genitourinary: Negative for difficulty urinating and dysuria.   Musculoskeletal:       Persistent hand pain.  Planning for surgery.  Persistent left knee pain.  Going to therapy.    Skin: Negative for color change and rash.  Neurological: Negative for dizziness, light-headedness and headaches.  Psychiatric/Behavioral: Negative for agitation and dysphoric mood.       Objective:      blood pressure rechecked by me:  118/78  Physical Exam  Constitutional: She appears well-developed and well-nourished. No distress.  HENT:  Nose: Nose normal.  Mouth/Throat: Oropharynx is clear and moist.  Neck: Neck supple. No thyromegaly present.  Cardiovascular: Normal rate and regular rhythm.   Pulmonary/Chest: Breath sounds normal. No respiratory distress. She has no wheezes.  Abdominal: Soft. Bowel sounds are normal. There is no tenderness.  Musculoskeletal: She exhibits no edema or tenderness.  Lymphadenopathy:    She has no cervical adenopathy.  Skin: No rash noted. No erythema.  Psychiatric: She has a normal mood and affect. Her behavior is normal.    BP 118/78   Pulse (!) 102   Temp 98.2 F (36.8 C) (Oral)   Ht 5\' 2"  (1.575 m)   Wt 163 lb 6.4 oz (74.1 kg)   SpO2 98%   BMI 29.89 kg/m  Wt Readings from Last 3 Encounters:  06/16/16 163 lb 6.4 oz (74.1 kg)  03/17/16 159 lb 1.9 oz (72.2 kg)  11/12/15 159 lb 8 oz (72.3 kg)     Lab Results  Component Value Date   WBC 8.5 10/18/2013   HGB 12.3 10/18/2013   HCT 36.8 10/18/2013   PLT 299.0 10/18/2013   GLUCOSE 90 03/17/2016   CHOL 177 03/17/2016   TRIG 74.0 03/17/2016   HDL 58.30 03/17/2016   LDLCALC 104 (H) 03/17/2016   ALT 18 03/17/2016   AST 19 03/17/2016   NA 138 03/17/2016   K 4.0 03/17/2016   CL 105 03/17/2016   CREATININE 0.93 03/17/2016   BUN 16 03/17/2016   CO2 28 03/17/2016   TSH 1.71 03/17/2016    Mr Knee Left  Wo Contrast  Result Date: 02/19/2016 CLINICAL DATA:  Posterior inferior patellar pain for 2-3 weeks. EXAM: MRI OF THE LEFT KNEE WITHOUT CONTRAST TECHNIQUE:  Multiplanar, multisequence MR imaging of the knee was performed. No intravenous contrast was administered. COMPARISON:  None. FINDINGS: MENISCI Medial meniscus: Vertical signal abnormality in the posterior meniscocapsular junction of the medial meniscus which may reflect prior injury without complete separation. Otherwise intact medial meniscus. Lateral meniscus:  Intact. LIGAMENTS Cruciates:  Intact ACL and PCL. Collaterals: Medial collateral ligament is intact. Lateral collateral ligament complex is intact. CARTILAGE Patellofemoral:  No chondral defect.  Medial:  No chondral defect. Lateral:  Chondromalacia of the lateral tibial plateau. Joint: No joint effusion. No plical thickening. Minimal edema in Hoffa's fat. Popliteal Fossa:  Tiny Baker cyst.  Intact popliteus tendon. Extensor Mechanism: Intact quadriceps tendon. Mild tendinosis of the proximal patellar tendon. Bones:  No marrow signal abnormality.  No fracture or dislocation. Other: None IMPRESSION: 1. Mild tendinosis of the proximal patellar tendon. 2. Vertical signal abnormality in the posterior meniscocapsular junction of the medial meniscus which may reflect prior injury without complete separation. Otherwise intact medial meniscus. 3. Mild chondromalacia of the lateral tibial plateau. Electronically Signed   By: Kathreen Devoid   On: 02/19/2016 12:23       Assessment & Plan:   Problem List Items Addressed This Visit    Environmental allergies    Stable.       Essential hypertension, benign    Blood pressure under good control.  Continue same medication regimen.  Follow pressures.  Follow metabolic panel.        Relevant Orders   CBC with Differential/Platelet   Basic metabolic panel   GERD (gastroesophageal reflux disease) - Primary    Had EGD 02/2013.  With increased acid reflux. Now on bid zantac.  Symptom have improved.  Avoid foods that aggravate.  Avoid eating late.  Follow closely.  Notify if symptoms persist.         Hypercholesterolemia    Low cholesterol diet and exercise.  Follow lipid panel.        Relevant Orders   Hepatic function panel   Lipid panel   Obesity (BMI 30-39.9)    Diet and exercise.  She had questions about medication to help lose weight.  Hold on medication.  Discussed diet and exercise.  Follow.        Rheumatoid arthritis ([place])    Followed by Dr Jefm Bryant.  With increased hand pain.  Seeing Dr Amedeo Plenty.  Planning for surgery.         Other Visit Diagnoses    Encounter for sterilization       Relevant Medications   medroxyPROGESTERone (DEPO-PROVERA) injection 150 mg (Completed)       Einar Pheasant, MD

## 2016-06-16 NOTE — Progress Notes (Signed)
Pre visit review using our clinic review tool, if applicable. No additional management support is needed unless otherwise documented below in the visit note. 

## 2016-06-17 ENCOUNTER — Encounter: Payer: Self-pay | Admitting: Internal Medicine

## 2016-06-17 NOTE — Assessment & Plan Note (Signed)
Followed by Dr Jefm Bryant.  With increased hand pain.  Seeing Dr Amedeo Plenty.  Planning for surgery.

## 2016-06-17 NOTE — Assessment & Plan Note (Signed)
Diet and exercise.  She had questions about medication to help lose weight.  Hold on medication.  Discussed diet and exercise.  Follow.

## 2016-06-17 NOTE — Assessment & Plan Note (Signed)
Stable

## 2016-06-17 NOTE — Assessment & Plan Note (Signed)
Low cholesterol diet and exercise.  Follow lipid panel.   

## 2016-06-17 NOTE — Assessment & Plan Note (Signed)
Blood pressure under good control.  Continue same medication regimen.  Follow pressures.  Follow metabolic panel.   

## 2016-06-17 NOTE — Assessment & Plan Note (Signed)
Had EGD 02/2013.  With increased acid reflux. Now on bid zantac.  Symptom have improved.  Avoid foods that aggravate.  Avoid eating late.  Follow closely.  Notify if symptoms persist.

## 2016-07-04 DIAGNOSIS — M65311 Trigger thumb, right thumb: Secondary | ICD-10-CM | POA: Diagnosis not present

## 2016-07-04 DIAGNOSIS — Z4789 Encounter for other orthopedic aftercare: Secondary | ICD-10-CM | POA: Diagnosis not present

## 2016-07-07 DIAGNOSIS — G5601 Carpal tunnel syndrome, right upper limb: Secondary | ICD-10-CM | POA: Diagnosis not present

## 2016-07-21 DIAGNOSIS — G5601 Carpal tunnel syndrome, right upper limb: Secondary | ICD-10-CM | POA: Diagnosis not present

## 2016-07-28 DIAGNOSIS — G5601 Carpal tunnel syndrome, right upper limb: Secondary | ICD-10-CM | POA: Diagnosis not present

## 2016-08-04 DIAGNOSIS — Z79899 Other long term (current) drug therapy: Secondary | ICD-10-CM | POA: Diagnosis not present

## 2016-08-04 DIAGNOSIS — M0579 Rheumatoid arthritis with rheumatoid factor of multiple sites without organ or systems involvement: Secondary | ICD-10-CM | POA: Diagnosis not present

## 2016-08-11 DIAGNOSIS — M7062 Trochanteric bursitis, left hip: Secondary | ICD-10-CM | POA: Diagnosis not present

## 2016-08-11 DIAGNOSIS — M25562 Pain in left knee: Secondary | ICD-10-CM | POA: Diagnosis not present

## 2016-08-11 DIAGNOSIS — M0579 Rheumatoid arthritis with rheumatoid factor of multiple sites without organ or systems involvement: Secondary | ICD-10-CM | POA: Diagnosis not present

## 2016-08-25 DIAGNOSIS — N951 Menopausal and female climacteric states: Secondary | ICD-10-CM | POA: Diagnosis not present

## 2016-09-01 DIAGNOSIS — S83207D Unspecified tear of unspecified meniscus, current injury, left knee, subsequent encounter: Secondary | ICD-10-CM | POA: Diagnosis not present

## 2016-09-01 DIAGNOSIS — E538 Deficiency of other specified B group vitamins: Secondary | ICD-10-CM | POA: Diagnosis not present

## 2016-09-01 DIAGNOSIS — N951 Menopausal and female climacteric states: Secondary | ICD-10-CM | POA: Diagnosis not present

## 2016-09-01 DIAGNOSIS — I10 Essential (primary) hypertension: Secondary | ICD-10-CM | POA: Diagnosis not present

## 2016-09-01 DIAGNOSIS — E039 Hypothyroidism, unspecified: Secondary | ICD-10-CM | POA: Diagnosis not present

## 2016-09-01 DIAGNOSIS — M1712 Unilateral primary osteoarthritis, left knee: Secondary | ICD-10-CM | POA: Diagnosis not present

## 2016-09-08 ENCOUNTER — Other Ambulatory Visit: Payer: Commercial Managed Care - HMO

## 2016-09-08 DIAGNOSIS — E538 Deficiency of other specified B group vitamins: Secondary | ICD-10-CM | POA: Diagnosis not present

## 2016-09-08 DIAGNOSIS — E559 Vitamin D deficiency, unspecified: Secondary | ICD-10-CM | POA: Diagnosis not present

## 2016-09-08 DIAGNOSIS — E039 Hypothyroidism, unspecified: Secondary | ICD-10-CM | POA: Diagnosis not present

## 2016-09-15 ENCOUNTER — Ambulatory Visit (INDEPENDENT_AMBULATORY_CARE_PROVIDER_SITE_OTHER): Payer: Commercial Managed Care - HMO | Admitting: Internal Medicine

## 2016-09-15 ENCOUNTER — Encounter: Payer: Self-pay | Admitting: Internal Medicine

## 2016-09-15 VITALS — BP 134/86 | HR 100 | Temp 98.7°F | Resp 16 | Ht 62.0 in | Wt 162.6 lb

## 2016-09-15 DIAGNOSIS — Z3009 Encounter for other general counseling and advice on contraception: Secondary | ICD-10-CM

## 2016-09-15 DIAGNOSIS — E559 Vitamin D deficiency, unspecified: Secondary | ICD-10-CM

## 2016-09-15 DIAGNOSIS — Z1231 Encounter for screening mammogram for malignant neoplasm of breast: Secondary | ICD-10-CM

## 2016-09-15 DIAGNOSIS — E7800 Pure hypercholesterolemia, unspecified: Secondary | ICD-10-CM

## 2016-09-15 DIAGNOSIS — I10 Essential (primary) hypertension: Secondary | ICD-10-CM | POA: Diagnosis not present

## 2016-09-15 DIAGNOSIS — M059 Rheumatoid arthritis with rheumatoid factor, unspecified: Secondary | ICD-10-CM

## 2016-09-15 DIAGNOSIS — K219 Gastro-esophageal reflux disease without esophagitis: Secondary | ICD-10-CM

## 2016-09-15 DIAGNOSIS — Z803 Family history of malignant neoplasm of breast: Secondary | ICD-10-CM

## 2016-09-15 DIAGNOSIS — E538 Deficiency of other specified B group vitamins: Secondary | ICD-10-CM | POA: Diagnosis not present

## 2016-09-15 DIAGNOSIS — Z1239 Encounter for other screening for malignant neoplasm of breast: Secondary | ICD-10-CM

## 2016-09-15 DIAGNOSIS — D649 Anemia, unspecified: Secondary | ICD-10-CM | POA: Diagnosis not present

## 2016-09-15 DIAGNOSIS — E039 Hypothyroidism, unspecified: Secondary | ICD-10-CM | POA: Diagnosis not present

## 2016-09-15 LAB — POCT URINE PREGNANCY: Preg Test, Ur: NEGATIVE

## 2016-09-15 MED ORDER — MEDROXYPROGESTERONE ACETATE 150 MG/ML IM SUSP
150.0000 mg | Freq: Once | INTRAMUSCULAR | Status: AC
  Administered 2016-09-15: 150 mg via INTRAMUSCULAR

## 2016-09-15 NOTE — Patient Instructions (Signed)
Vitamin D3 2000 units per day  miralax daily.

## 2016-09-15 NOTE — Progress Notes (Signed)
Patient ID: Tina Parker, female   DOB: 1971-03-05, 46 y.o.   MRN: [id number]   Subjective:    Patient ID: Tina Parker, female    DOB: Jun 25, 1971, 46 y.o.   MRN: [id number]  HPI  Patient here for a scheduled follow up.  She is followed by Dr Jefm Bryant for her RA.  On MTX and orencia.  Overall appears to be stable.  She is having problems with her left knee.  Has a medial meniscus tear.  Seeing ortho.  Just had another injection 09/01/16.  Limits her exercise.  No chest pain.  No sob.  No acid reflux.  No abdominal pain.  Discussed continued miralax to help bowels move.  Handling stress.  Overall she feels she is doing relatively well.     Past Medical History:  Diagnosis Date  . Allergic rhinitis   . Anemia   . Carpal tunnel syndrome   . Dysphagia   . GERD (gastroesophageal reflux disease)   . Graves disease    remission, no ablation, positive medical treatment  . Hypertension   . Migraine headache   . PPD positive    hepatitis secondary to [place]  . Pure hypercholesterolemia   . Rheumatoid arthritis(714.0)    positive anti CCP antibodies, positive RF, oligo-articular, MTX   Past Surgical History:  Procedure Laterality Date  . CESAREAN SECTION  1996  . Post Septoplasty and turbinate reduction  2007   Family History  Problem Relation Age of Onset  . Cancer Mother     Breast Cancer  . Breast cancer Mother 1  . Cancer Father     Colon Cancer  . Heart disease Father     Hx of MI  . Hypertension Father   . Diabetes Father   . Cancer Maternal Grandmother     lung cancer  . Cancer Maternal Uncle     esophageal   Social History   Social History  . Marital status: Married    Spouse name: N/A  . Number of children: N/A  . Years of education: N/A   Social History Main Topics  . Smoking status: Never Smoker  . Smokeless tobacco: Never Used  . Alcohol use 0.0 oz/week     Comment: occasionally  . Drug use: No  . Sexual activity: Not Asked   Other Topics Concern  .  None   Social History Narrative  . None    Outpatient Encounter Prescriptions as of 09/15/2016  Medication Sig  . albuterol (PROVENTIL HFA;VENTOLIN HFA) 108 (90 BASE) MCG/ACT inhaler Inhale 2 puffs into the lungs every 6 (six) hours as needed for wheezing or shortness of breath.  Marland Kitchen amLODipine (NORVASC) 5 MG tablet take 1 tablet by mouth once daily  . dexlansoprazole (DEXILANT) 60 MG capsule Take 60 mg by mouth daily.  . fexofenadine (ALLEGRA) 180 MG tablet Take 180 mg by mouth daily.  . folic acid (FOLVITE) 1 MG tablet Take 1 mg by mouth daily.  Marland Kitchen ibuprofen (ADVIL,MOTRIN) 800 MG tablet Take 800 mg by mouth 3 (three) times daily as needed. TID PRN for pain  . losartan (COZAAR) 50 MG tablet take 1 tablet by mouth once daily  . medroxyPROGESTERone (DEPO-PROVERA) 150 MG/ML injection Inject 150 mg into the muscle every 3 (three) months.  . methotrexate (RHEUMATREX) 2.5 MG tablet Take 2.5 mg by mouth once a week. Caution:Chemotherapy. Protect from light. Take 8 tabs po weekly with meals  . montelukast (SINGULAIR) 10 MG tablet Take 1 tablet (10 mg  total) by mouth at bedtime.  Marland Kitchen ORENCIA 125 MG/ML SOSY   . ranitidine (ZANTAC) 150 MG tablet Take 150 mg by mouth 2 (two) times daily.  . traZODone (DESYREL) 50 MG tablet Take 0.5-1 tablets (25-50 mg total) by mouth at bedtime as needed for sleep.  . [EXPIRED] medroxyPROGESTERone (DEPO-PROVERA) injection 150 mg    No facility-administered encounter medications on file as of 09/15/2016.     Review of Systems  Constitutional: Negative for appetite change and unexpected weight change.  HENT: Negative for congestion and sinus pressure.   Respiratory: Negative for cough, chest tightness and shortness of breath.   Cardiovascular: Negative for chest pain, palpitations and leg swelling.  Gastrointestinal: Negative for abdominal pain, diarrhea, nausea and vomiting.  Genitourinary: Negative for difficulty urinating and dysuria.  Musculoskeletal: Negative for  back pain.       Left knee pain as outlined.    Skin: Negative for color change and rash.  Neurological: Negative for dizziness, light-headedness and headaches.  Psychiatric/Behavioral: Negative for agitation and dysphoric mood.       Objective:     Blood pressure rechecked by me:  128/84  Physical Exam  Constitutional: She appears well-developed and well-nourished. No distress.  HENT:  Nose: Nose normal.  Mouth/Throat: Oropharynx is clear and moist.  Neck: Neck supple. No thyromegaly present.  Cardiovascular: Normal rate and regular rhythm.   Pulmonary/Chest: Breath sounds normal. No respiratory distress. She has no wheezes.  Abdominal: Soft. Bowel sounds are normal. There is no tenderness.  Musculoskeletal: She exhibits no edema or tenderness.  Lymphadenopathy:    She has no cervical adenopathy.  Skin: No rash noted. No erythema.  Psychiatric: She has a normal mood and affect. Her behavior is normal.    BP 134/86 (BP Location: Left Arm, Patient Position: Sitting, Cuff Size: Large)   Pulse 100   Temp 98.7 F (37.1 C) (Oral)   Resp 16   Ht 5\' 2"  (1.575 m)   Wt 162 lb 9.6 oz (73.8 kg)   SpO2 98%   BMI 29.74 kg/m  Wt Readings from Last 3 Encounters:  09/15/16 162 lb 9.6 oz (73.8 kg)  06/16/16 163 lb 6.4 oz (74.1 kg)  03/17/16 159 lb 1.9 oz (72.2 kg)     Lab Results  Component Value Date   WBC 8.5 10/18/2013   HGB 12.3 10/18/2013   HCT 36.8 10/18/2013   PLT 299.0 10/18/2013   GLUCOSE 90 03/17/2016   CHOL 177 03/17/2016   TRIG 74.0 03/17/2016   HDL 58.30 03/17/2016   LDLCALC 104 (H) 03/17/2016   ALT 18 03/17/2016   AST 19 03/17/2016   NA 138 03/17/2016   K 4.0 03/17/2016   CL 105 03/17/2016   CREATININE 0.93 03/17/2016   BUN 16 03/17/2016   CO2 28 03/17/2016   TSH 1.71 03/17/2016    Mr Knee Left  Wo Contrast  Result Date: 02/19/2016 CLINICAL DATA:  Posterior inferior patellar pain for 2-3 weeks. EXAM: MRI OF THE LEFT KNEE WITHOUT CONTRAST TECHNIQUE:  Multiplanar, multisequence MR imaging of the knee was performed. No intravenous contrast was administered. COMPARISON:  None. FINDINGS: MENISCI Medial meniscus: Vertical signal abnormality in the posterior meniscocapsular junction of the medial meniscus which may reflect prior injury without complete separation. Otherwise intact medial meniscus. Lateral meniscus:  Intact. LIGAMENTS Cruciates:  Intact ACL and PCL. Collaterals: Medial collateral ligament is intact. Lateral collateral ligament complex is intact. CARTILAGE Patellofemoral:  No chondral defect. Medial:  No chondral defect. Lateral:  Chondromalacia of the lateral tibial plateau. Joint: No joint effusion. No plical thickening. Minimal edema in Hoffa's fat. Popliteal Fossa:  Tiny Baker cyst.  Intact popliteus tendon. Extensor Mechanism: Intact quadriceps tendon. Mild tendinosis of the proximal patellar tendon. Bones:  No marrow signal abnormality.  No fracture or dislocation. Other: None IMPRESSION: 1. Mild tendinosis of the proximal patellar tendon. 2. Vertical signal abnormality in the posterior meniscocapsular junction of the medial meniscus which may reflect prior injury without complete separation. Otherwise intact medial meniscus. 3. Mild chondromalacia of the lateral tibial plateau. Electronically Signed   By: Kathreen Devoid   On: 02/19/2016 12:23       Assessment & Plan:   Problem List Items Addressed This Visit    Anemia    Follow cbc.       Essential hypertension, benign    Blood pressure under good control.  Continue same medication regimen.  Follow pressures.  Follow metabolic panel.        Family history of breast cancer    Mammogram 12/03/15 - Bir[facility] I.  Schedule f/u mammogram.        GERD (gastroesophageal reflux disease)    Controlled on current regimen.  Follow.        Hypercholesterolemia    Low cholesterol diet and exercise.  Follow lipid panel.        Rheumatoid arthritis ([place])    Followed by Dr Jefm Bryant.  Stable  on orencia and MTX.  Some pain and limitations with her right hand.  See previous note.        Vitamin D deficiency    Vitamin D supplements.  Follow.         Other Visit Diagnoses    Breast cancer screening    -  Primary   Relevant Orders   MM DIGITAL SCREENING BILATERAL   Birth control counseling       On Depo.  overdue injection.  check urine pregnancy test prior to giving.     Relevant Medications   medroxyPROGESTERone (DEPO-PROVERA) injection 150 mg (Completed)   Other Relevant Orders   POCT urine pregnancy (Completed)       Einar Pheasant, MD

## 2016-09-15 NOTE — Progress Notes (Signed)
Pre-visit discussion using our clinic review tool. No additional management support is needed unless otherwise documented below in the visit note.  

## 2016-09-21 ENCOUNTER — Encounter: Payer: Self-pay | Admitting: Internal Medicine

## 2016-09-21 DIAGNOSIS — E559 Vitamin D deficiency, unspecified: Secondary | ICD-10-CM | POA: Insufficient documentation

## 2016-09-21 NOTE — Assessment & Plan Note (Signed)
Low cholesterol diet and exercise.  Follow lipid panel.   

## 2016-09-21 NOTE — Assessment & Plan Note (Signed)
Mammogram 12/03/15 - Birads I.  Schedule f/u mammogram.

## 2016-09-21 NOTE — Assessment & Plan Note (Signed)
Blood pressure under good control.  Continue same medication regimen.  Follow pressures.  Follow metabolic panel.   

## 2016-09-21 NOTE — Assessment & Plan Note (Signed)
Followed by Dr Jefm Bryant.  Stable on orencia and MTX.  Some pain and limitations with her right hand.  See previous note.

## 2016-09-21 NOTE — Assessment & Plan Note (Signed)
Follow cbc.  

## 2016-09-21 NOTE — Assessment & Plan Note (Signed)
Controlled on current regimen.  Follow.  

## 2016-09-21 NOTE — Assessment & Plan Note (Signed)
Vitamin D supplements.  Follow.   

## 2016-09-22 DIAGNOSIS — E559 Vitamin D deficiency, unspecified: Secondary | ICD-10-CM | POA: Diagnosis not present

## 2016-09-22 DIAGNOSIS — E538 Deficiency of other specified B group vitamins: Secondary | ICD-10-CM | POA: Diagnosis not present

## 2016-09-22 DIAGNOSIS — E039 Hypothyroidism, unspecified: Secondary | ICD-10-CM | POA: Diagnosis not present

## 2016-10-05 ENCOUNTER — Other Ambulatory Visit: Payer: Self-pay | Admitting: Internal Medicine

## 2016-10-06 DIAGNOSIS — I10 Essential (primary) hypertension: Secondary | ICD-10-CM | POA: Diagnosis not present

## 2016-10-06 DIAGNOSIS — E039 Hypothyroidism, unspecified: Secondary | ICD-10-CM | POA: Diagnosis not present

## 2016-10-06 DIAGNOSIS — E538 Deficiency of other specified B group vitamins: Secondary | ICD-10-CM | POA: Diagnosis not present

## 2016-10-27 ENCOUNTER — Ambulatory Visit: Payer: Commercial Managed Care - HMO | Admitting: Family

## 2016-12-04 ENCOUNTER — Ambulatory Visit: Payer: Commercial Managed Care - HMO

## 2016-12-16 ENCOUNTER — Ambulatory Visit (INDEPENDENT_AMBULATORY_CARE_PROVIDER_SITE_OTHER): Payer: Commercial Managed Care - HMO | Admitting: *Deleted

## 2016-12-16 DIAGNOSIS — Z3042 Encounter for surveillance of injectable contraceptive: Secondary | ICD-10-CM | POA: Diagnosis not present

## 2016-12-16 LAB — POCT URINE PREGNANCY: Preg Test, Ur: NEGATIVE

## 2016-12-16 MED ORDER — MEDROXYPROGESTERONE ACETATE 150 MG/ML IM SUSP: 150.0000 mg | Freq: Once | INTRAMUSCULAR | Status: DC

## 2016-12-16 MED ORDER — MEDROXYPROGESTERONE ACETATE 150 MG/ML IM SUSP
150.0000 mg | Freq: Once | INTRAMUSCULAR | Status: AC
  Administered 2016-12-16: 150 mg via INTRAMUSCULAR

## 2016-12-16 NOTE — Progress Notes (Signed)
Patient presented for Depo-Provera for contraception , being patient was one day late for injection she was given a urine pregnancy test which was negative so Depo- provera was given in RUQ. Patient voiced no concerns nor showed any signs of distress during injection.

## 2016-12-18 DIAGNOSIS — J029 Acute pharyngitis, unspecified: Secondary | ICD-10-CM | POA: Diagnosis not present

## 2017-01-01 ENCOUNTER — Ambulatory Visit
Admission: RE | Admit: 2017-01-01 | Discharge: 2017-01-01 | Disposition: A | Discharge status: Home / Self Care | Payer: Commercial Managed Care - HMO | Source: Ambulatory Visit | Attending: Internal Medicine | Admitting: Internal Medicine

## 2017-01-01 DIAGNOSIS — Z1239 Encounter for other screening for malignant neoplasm of breast: Secondary | ICD-10-CM

## 2017-01-01 DIAGNOSIS — Z1231 Encounter for screening mammogram for malignant neoplasm of breast: Secondary | ICD-10-CM | POA: Insufficient documentation

## 2017-01-15 DIAGNOSIS — D649 Anemia, unspecified: Secondary | ICD-10-CM | POA: Diagnosis not present

## 2017-01-15 DIAGNOSIS — K219 Gastro-esophageal reflux disease without esophagitis: Secondary | ICD-10-CM | POA: Diagnosis not present

## 2017-01-19 ENCOUNTER — Encounter: Payer: Self-pay | Admitting: Internal Medicine

## 2017-01-19 ENCOUNTER — Ambulatory Visit (INDEPENDENT_AMBULATORY_CARE_PROVIDER_SITE_OTHER): Payer: Commercial Managed Care - HMO | Admitting: Internal Medicine

## 2017-01-19 VITALS — BP 108/64 | HR 65 | Temp 98.9°F | Resp 12 | Ht 62.0 in | Wt 165.0 lb

## 2017-01-19 DIAGNOSIS — Z800 Family history of malignant neoplasm of digestive organs: Secondary | ICD-10-CM

## 2017-01-19 DIAGNOSIS — I10 Essential (primary) hypertension: Secondary | ICD-10-CM

## 2017-01-19 DIAGNOSIS — E039 Hypothyroidism, unspecified: Secondary | ICD-10-CM | POA: Diagnosis not present

## 2017-01-19 DIAGNOSIS — Z9109 Other allergy status, other than to drugs and biological substances: Secondary | ICD-10-CM

## 2017-01-19 DIAGNOSIS — E7800 Pure hypercholesterolemia, unspecified: Secondary | ICD-10-CM

## 2017-01-19 DIAGNOSIS — M059 Rheumatoid arthritis with rheumatoid factor, unspecified: Secondary | ICD-10-CM

## 2017-01-19 DIAGNOSIS — K219 Gastro-esophageal reflux disease without esophagitis: Secondary | ICD-10-CM

## 2017-01-19 DIAGNOSIS — Z6830 Body mass index (BMI) 30.0-30.9, adult: Secondary | ICD-10-CM | POA: Diagnosis not present

## 2017-01-19 DIAGNOSIS — E559 Vitamin D deficiency, unspecified: Secondary | ICD-10-CM

## 2017-01-19 DIAGNOSIS — R1310 Dysphagia, unspecified: Secondary | ICD-10-CM | POA: Diagnosis not present

## 2017-01-19 LAB — CBC WITH DIFFERENTIAL/PLATELET
Basophils Absolute: 0 10*3/uL (ref 0.0–0.1)
Basophils Relative: 0.5 % (ref 0.0–3.0)
Eosinophils Absolute: 0 10*3/uL (ref 0.0–0.7)
Eosinophils Relative: 0.4 % (ref 0.0–5.0)
HCT: 39.1 % (ref 36.0–46.0)
Hemoglobin: 13.2 g/dL (ref 12.0–15.0)
Lymphocytes Relative: 21.4 % (ref 12.0–46.0)
Lymphs Abs: 1.9 10*3/uL (ref 0.7–4.0)
MCHC: 33.6 g/dL (ref 30.0–36.0)
MCV: 93.2 fl (ref 78.0–100.0)
Monocytes Absolute: 0.7 10*3/uL (ref 0.1–1.0)
Monocytes Relative: 8.1 % (ref 3.0–12.0)
Neutro Abs: 6.2 10*3/uL (ref 1.4–7.7)
Neutrophils Relative %: 69.6 % (ref 43.0–77.0)
Platelets: 324 10*3/uL (ref 150.0–400.0)
RBC: 4.2 Mil/uL (ref 3.87–5.11)
RDW: 14.3 % (ref 11.5–15.5)
WBC: 9 10*3/uL (ref 4.0–10.5)

## 2017-01-19 LAB — LIPID PANEL
Cholesterol: 187 mg/dL (ref 0–200)
HDL: 51.7 mg/dL (ref >39.00)
LDL Cholesterol: 124 mg/dL — ABNORMAL HIGH (ref 0–99)
NonHDL: 135.25
Total CHOL/HDL Ratio: 4
Triglycerides: 57 mg/dL (ref 0.0–149.0)
VLDL: 11.4 mg/dL (ref 0.0–40.0)

## 2017-01-19 LAB — BASIC METABOLIC PANEL
BUN: 9 mg/dL (ref 6–23)
CO2: 24 mEq/L (ref 19–32)
Calcium: 9.3 mg/dL (ref 8.4–10.5)
Chloride: 107 mEq/L (ref 96–112)
Creatinine, Ser: 0.95 mg/dL (ref 0.40–1.20)
GFR: 80.8 mL/min (ref >60.00)
Glucose, Bld: 85 mg/dL (ref 70–99)
Potassium: 4.3 mEq/L (ref 3.5–5.1)
Sodium: 137 mEq/L (ref 135–145)

## 2017-01-19 LAB — TSH: TSH: 2.48 u[IU]/mL (ref 0.35–4.50)

## 2017-01-19 LAB — HEPATIC FUNCTION PANEL
ALT: 14 U/L (ref 0–35)
AST: 17 U/L (ref 0–37)
Albumin: 3.8 g/dL (ref 3.5–5.2)
Alkaline Phosphatase: 79 U/L (ref 39–117)
Bilirubin, Direct: 0.1 mg/dL (ref 0.0–0.3)
Total Bilirubin: 0.6 mg/dL (ref 0.2–1.2)
Total Protein: 7.4 g/dL (ref 6.0–8.3)

## 2017-01-19 MED ORDER — MONTELUKAST SODIUM 10 MG PO TABS: 10.0000 mg | ORAL_TABLET | Freq: Every day | ORAL | 3 refills | Status: DC

## 2017-01-19 NOTE — Progress Notes (Signed)
Patient ID: Tina Parker, female   DOB: Sep 03, 1970, 46 y.o.   MRN: [id number]   Subjective:    Patient ID: Tina Parker, female    DOB: 03-17-1971, 46 y.o.   MRN: [id number]  HPI  Patient here for a scheduled follow up.  Saw GI.  Dexilant added.  Still taking zantac.  This has helped her upper symptoms.  Planning for EGD 02/26/17.  Having issues with her allergies.  Taking allegra and using nasal spray.  Not taking her singulair.  Planning to restart.  Has appt with her allergist next week.  No chest pain.  No sob.  No abdominal pain.  Bowels moving - taking dulcolax.     Past Medical History:  Diagnosis Date  . Allergic rhinitis   . Anemia   . Carpal tunnel syndrome   . Dysphagia   . GERD (gastroesophageal reflux disease)   . Graves disease    remission, no ablation, positive medical treatment  . Hypertension   . Migraine headache   . PPD positive    hepatitis secondary to [place]  . Pure hypercholesterolemia   . Rheumatoid arthritis(714.0)    positive anti CCP antibodies, positive RF, oligo-articular, MTX   Past Surgical History:  Procedure Laterality Date  . CESAREAN SECTION  1996  . Post Septoplasty and turbinate reduction  2007   Family History  Problem Relation Age of Onset  . Cancer Mother        Breast Cancer  . Breast cancer Mother 39  . Cancer Father        Colon Cancer  . Heart disease Father        Hx of MI  . Hypertension Father   . Diabetes Father   . Cancer Maternal Grandmother        lung cancer  . Cancer Maternal Uncle        esophageal   Social History   Social History  . Marital status: Married    Spouse name: N/A  . Number of children: N/A  . Years of education: N/A   Social History Main Topics  . Smoking status: Never Smoker  . Smokeless tobacco: Never Used  . Alcohol use 0.0 oz/week     Comment: occasionally  . Drug use: No  . Sexual activity: Not Asked   Other Topics Concern  . None   Social History Narrative  . None     Outpatient Encounter Prescriptions as of 01/19/2017  Medication Sig  . albuterol (PROVENTIL HFA;VENTOLIN HFA) 108 (90 BASE) MCG/ACT inhaler Inhale 2 puffs into the lungs every 6 (six) hours as needed for wheezing or shortness of breath.  Marland Kitchen amLODipine (NORVASC) 5 MG tablet take 1 tablet by mouth once daily  . dexlansoprazole (DEXILANT) 60 MG capsule Take 60 mg by mouth daily.  . fexofenadine (ALLEGRA) 180 MG tablet Take 180 mg by mouth daily.  Marland Kitchen ibuprofen (ADVIL,MOTRIN) 800 MG tablet Take 800 mg by mouth 3 (three) times daily as needed. TID PRN for pain  . losartan (COZAAR) 50 MG tablet take 1 tablet by mouth once daily  . medroxyPROGESTERone (DEPO-PROVERA) 150 MG/ML injection Inject 150 mg into the muscle every 3 (three) months.  . methotrexate (RHEUMATREX) 2.5 MG tablet Take 2.5 mg by mouth once a week. Caution:Chemotherapy. Protect from light. Take 8 tabs po weekly with meals  . montelukast (SINGULAIR) 10 MG tablet Take 1 tablet (10 mg total) by mouth at bedtime.  Marland Kitchen ORENCIA 125 MG/ML SOSY   .  ranitidine (ZANTAC) 150 MG tablet Take 150 mg by mouth 2 (two) times daily.  . [DISCONTINUED] montelukast (SINGULAIR) 10 MG tablet Take 1 tablet (10 mg total) by mouth at bedtime.  . folic acid (FOLVITE) 1 MG tablet Take 1 mg by mouth daily.  . traZODone (DESYREL) 50 MG tablet Take 0.5-1 tablets (25-50 mg total) by mouth at bedtime as needed for sleep. (Patient not taking: Reported on 01/19/2017)   No facility-administered encounter medications on file as of 01/19/2017.     Review of Systems  Constitutional: Negative for appetite change and unexpected weight change.  HENT: Positive for congestion and postnasal drip.   Respiratory: Negative for cough, chest tightness and shortness of breath.   Cardiovascular: Negative for chest pain, palpitations and leg swelling.  Gastrointestinal: Negative for abdominal pain, diarrhea, nausea and vomiting.  Genitourinary: Negative for difficulty urinating and  dysuria.  Musculoskeletal: Negative for back pain and myalgias.  Skin: Negative for color change and rash.  Neurological: Negative for dizziness, light-headedness and headaches.  Psychiatric/Behavioral: Negative for agitation and dysphoric mood.       Objective:     Blood pressure rechecked by me:  124/76  Physical Exam  Constitutional: She appears well-developed and well-nourished. No distress.  HENT:  Nose: Nose normal.  Mouth/Throat: Oropharynx is clear and moist.  Neck: Neck supple. No thyromegaly present.  Cardiovascular: Normal rate and regular rhythm.   Pulmonary/Chest: Breath sounds normal. No respiratory distress. She has no wheezes.  Abdominal: Soft. Bowel sounds are normal. There is no tenderness.  Musculoskeletal: She exhibits no edema or tenderness.  Lymphadenopathy:    She has no cervical adenopathy.  Skin: No rash noted. No erythema.  Psychiatric: She has a normal mood and affect. Her behavior is normal.    BP 108/64 (BP Location: Left Arm, Patient Position: Sitting, Cuff Size: Normal)   Pulse 65   Temp 98.9 F (37.2 C) (Oral)   Resp 12   Ht 5\' 2"  (1.575 m)   Wt 165 lb (74.8 kg)   SpO2 99%   BMI 30.18 kg/m  Wt Readings from Last 3 Encounters:  01/19/17 165 lb (74.8 kg)  09/15/16 162 lb 9.6 oz (73.8 kg)  06/16/16 163 lb 6.4 oz (74.1 kg)     Lab Results  Component Value Date   WBC 9.0 01/19/2017   HGB 13.2 01/19/2017   HCT 39.1 01/19/2017   PLT 324.0 01/19/2017   GLUCOSE 85 01/19/2017   CHOL 187 01/19/2017   TRIG 57.0 01/19/2017   HDL 51.70 01/19/2017   LDLCALC 124 (H) 01/19/2017   ALT 14 01/19/2017   AST 17 01/19/2017   NA 137 01/19/2017   K 4.3 01/19/2017   CL 107 01/19/2017   CREATININE 0.95 01/19/2017   BUN 9 01/19/2017   CO2 24 01/19/2017   TSH 2.48 01/19/2017    Mm Digital Screening Bilateral  Result Date: 01/01/2017 CLINICAL DATA:  Screening. EXAM: DIGITAL SCREENING BILATERAL MAMMOGRAM WITH CAD COMPARISON:  Previous exam(s). ACR  Breast Density Category c: The breast tissue is heterogeneously dense, which may obscure small masses. FINDINGS: There are no findings suspicious for malignancy. Images were processed with CAD. IMPRESSION: No mammographic evidence of malignancy. A result letter of this screening mammogram will be mailed directly to the patient. RECOMMENDATION: Screening mammogram in one year. (Code:SM-B-01Y) BI-RADS CATEGORY  1: Negative. Electronically Signed   By: Curlene Dolphin M.D.   On: 01/01/2017 16:31       Assessment & Plan:   Problem  List Items Addressed This Visit    BMI 30.0-30.9,adult    Diet and exercise.  Follow.        Dysphagia    Seeing GI.  On dexilant and zantac now.  Planning for EGD 02/26/17.      Environmental allergies    Persistent problems.  Restart singulair.  Continues on allegra and nasal sprays.  Planning to f/u with her allergist next week.        Essential hypertension, benign    Blood pressure has been under good control.  A little lowower recently. States has had some readings in the 90s.  Will decrease amlodipine to 5mg  1/2 tablet per day.   Follow pressures.  Follow metabolic panel.        Family history of colon cancer    Colonoscopy 03/07/13.        GERD (gastroesophageal reflux disease)    On dexilant and zantac now.  Saw GI.  plannning for EGD 02/26/17.        Hypercholesterolemia    Low cholesterol diet and exercise.  Follow lipid panel.        Hypothyroidism - Primary   Relevant Orders   TSH (Completed)   Rheumatoid arthritis ([place])    Followed by Dr Jefm Bryant.  Stable on current regimen.        Vitamin D deficiency    Follow vitamin D level.            Einar Pheasant, MD

## 2017-01-19 NOTE — Patient Instructions (Signed)
Decrease amlodipine to 5mg  - 1/2 tablet per day.   

## 2017-01-19 NOTE — Progress Notes (Signed)
Pre-visit discussion using our clinic review tool. No additional management support is needed unless otherwise documented below in the visit note.  

## 2017-01-20 ENCOUNTER — Encounter: Payer: Self-pay | Admitting: Internal Medicine

## 2017-01-21 ENCOUNTER — Encounter: Payer: Self-pay | Admitting: Internal Medicine

## 2017-01-21 NOTE — Assessment & Plan Note (Addendum)
Blood pressure has been under good control.  A little lowower recently. States has had some readings in the 90s.  Will decrease amlodipine to 5mg  1/2 tablet per day.   Follow pressures.  Follow metabolic panel.

## 2017-01-21 NOTE — Assessment & Plan Note (Signed)
On dexilant and zantac now.  Saw GI.  plannning for EGD 02/26/17.

## 2017-01-21 NOTE — Assessment & Plan Note (Signed)
Persistent problems.  Restart singulair.  Continues on allegra and nasal sprays.  Planning to f/u with her allergist next week.

## 2017-01-21 NOTE — Assessment & Plan Note (Signed)
Followed by Dr Jefm Bryant.  Stable on current regimen.

## 2017-01-21 NOTE — Assessment & Plan Note (Signed)
Seeing GI.  On dexilant and zantac now.  Planning for EGD 02/26/17.

## 2017-01-21 NOTE — Assessment & Plan Note (Signed)
Colonoscopy 03/07/13.

## 2017-01-21 NOTE — Assessment & Plan Note (Signed)
Diet and exercise.  Follow.  

## 2017-01-21 NOTE — Assessment & Plan Note (Signed)
Follow vitamin D level.  

## 2017-01-21 NOTE — Assessment & Plan Note (Signed)
Low cholesterol diet and exercise.  Follow lipid panel.   

## 2017-01-26 DIAGNOSIS — R062 Wheezing: Secondary | ICD-10-CM | POA: Diagnosis not present

## 2017-01-26 DIAGNOSIS — M0579 Rheumatoid arthritis with rheumatoid factor of multiple sites without organ or systems involvement: Secondary | ICD-10-CM | POA: Diagnosis not present

## 2017-01-26 DIAGNOSIS — M7061 Trochanteric bursitis, right hip: Secondary | ICD-10-CM | POA: Diagnosis not present

## 2017-01-26 DIAGNOSIS — J301 Allergic rhinitis due to pollen: Secondary | ICD-10-CM | POA: Diagnosis not present

## 2017-01-26 DIAGNOSIS — J3089 Other allergic rhinitis: Secondary | ICD-10-CM | POA: Diagnosis not present

## 2017-02-02 DIAGNOSIS — S83207D Unspecified tear of unspecified meniscus, current injury, left knee, subsequent encounter: Secondary | ICD-10-CM | POA: Diagnosis not present

## 2017-02-02 DIAGNOSIS — K224 Dyskinesia of esophagus: Secondary | ICD-10-CM | POA: Diagnosis not present

## 2017-02-02 DIAGNOSIS — M1712 Unilateral primary osteoarthritis, left knee: Secondary | ICD-10-CM | POA: Diagnosis not present

## 2017-02-16 DIAGNOSIS — M0579 Rheumatoid arthritis with rheumatoid factor of multiple sites without organ or systems involvement: Secondary | ICD-10-CM | POA: Diagnosis not present

## 2017-02-23 DIAGNOSIS — M7061 Trochanteric bursitis, right hip: Secondary | ICD-10-CM | POA: Diagnosis not present

## 2017-02-23 DIAGNOSIS — M0579 Rheumatoid arthritis with rheumatoid factor of multiple sites without organ or systems involvement: Secondary | ICD-10-CM | POA: Diagnosis not present

## 2017-02-23 DIAGNOSIS — M25562 Pain in left knee: Secondary | ICD-10-CM | POA: Diagnosis not present

## 2017-02-26 DIAGNOSIS — K449 Diaphragmatic hernia without obstruction or gangrene: Secondary | ICD-10-CM | POA: Diagnosis not present

## 2017-02-26 DIAGNOSIS — R1310 Dysphagia, unspecified: Secondary | ICD-10-CM | POA: Diagnosis not present

## 2017-02-26 DIAGNOSIS — K222 Esophageal obstruction: Secondary | ICD-10-CM | POA: Diagnosis not present

## 2017-03-10 ENCOUNTER — Ambulatory Visit: Payer: Commercial Managed Care - HMO

## 2017-03-12 ENCOUNTER — Ambulatory Visit: Payer: Commercial Managed Care - HMO

## 2017-03-16 ENCOUNTER — Encounter: Payer: Self-pay | Admitting: Internal Medicine

## 2017-03-16 ENCOUNTER — Ambulatory Visit (INDEPENDENT_AMBULATORY_CARE_PROVIDER_SITE_OTHER): Payer: 59 | Admitting: Internal Medicine

## 2017-03-16 ENCOUNTER — Telehealth: Payer: Self-pay

## 2017-03-16 VITALS — BP 124/62 | HR 81 | Temp 98.5°F | Resp 14 | Ht 62.0 in | Wt 164.0 lb

## 2017-03-16 DIAGNOSIS — R400 Somnolence: Secondary | ICD-10-CM

## 2017-03-16 DIAGNOSIS — Z9109 Other allergy status, other than to drugs and biological substances: Secondary | ICD-10-CM | POA: Diagnosis not present

## 2017-03-16 DIAGNOSIS — Z3042 Encounter for surveillance of injectable contraceptive: Secondary | ICD-10-CM | POA: Diagnosis not present

## 2017-03-16 DIAGNOSIS — Z23 Encounter for immunization: Secondary | ICD-10-CM | POA: Diagnosis not present

## 2017-03-16 DIAGNOSIS — M059 Rheumatoid arthritis with rheumatoid factor, unspecified: Secondary | ICD-10-CM

## 2017-03-16 DIAGNOSIS — E559 Vitamin D deficiency, unspecified: Secondary | ICD-10-CM

## 2017-03-16 DIAGNOSIS — E7800 Pure hypercholesterolemia, unspecified: Secondary | ICD-10-CM

## 2017-03-16 DIAGNOSIS — R0683 Snoring: Secondary | ICD-10-CM

## 2017-03-16 DIAGNOSIS — Z803 Family history of malignant neoplasm of breast: Secondary | ICD-10-CM

## 2017-03-16 DIAGNOSIS — K219 Gastro-esophageal reflux disease without esophagitis: Secondary | ICD-10-CM

## 2017-03-16 DIAGNOSIS — Z6830 Body mass index (BMI) 30.0-30.9, adult: Secondary | ICD-10-CM | POA: Diagnosis not present

## 2017-03-16 DIAGNOSIS — I10 Essential (primary) hypertension: Secondary | ICD-10-CM | POA: Diagnosis not present

## 2017-03-16 MED ORDER — MEDROXYPROGESTERONE ACETATE 150 MG/ML IM SUSP
150.0000 mg | Freq: Once | INTRAMUSCULAR | Status: AC
  Administered 2017-03-16: 150 mg via INTRAMUSCULAR

## 2017-03-16 NOTE — Telephone Encounter (Signed)
Patient in office for app and we do not have flu. Would like to be put on schedule for 9/24 at 9am. Pt informed of time and date while in office

## 2017-03-16 NOTE — Progress Notes (Signed)
Patient ID: Tina Parker, female   DOB: 05-01-1971, 46 y.o.   MRN: [id number]   Subjective:    Patient ID: Tina Parker, female    DOB: 03/07/1971, 46 y.o.   MRN: [id number]  HPI  Patient here for a scheduled follow up. She has been having pain in her right hip.  Seeing Dr Jefm Bryant.  S/p injection.  Reports if persistent pain may need scan.  She also saw ortho recently 02/02/17.  S/p injection of left knee.  Stable.  Having some issues with right ear fullness.  Has been followed by ENT.  Has f/u planned in 03/2017.  Taking singulair, xyzal and using nasal spray.  Breathing stable.  She is having some increased snoring.  Does not feel rested when she wakes up.  Daytime somnolence.  Ready for sleep study.  No chest pain.  No abdominal pain.  Bowels stable.     Past Medical History:  Diagnosis Date  . Allergic rhinitis   . Anemia   . Carpal tunnel syndrome   . Dysphagia   . GERD (gastroesophageal reflux disease)   . Graves disease    remission, no ablation, positive medical treatment  . Hypertension   . Migraine headache   . PPD positive    hepatitis secondary to [place]  . Pure hypercholesterolemia   . Rheumatoid arthritis(714.0)    positive anti CCP antibodies, positive RF, oligo-articular, MTX   Past Surgical History:  Procedure Laterality Date  . CESAREAN SECTION  1996  . Post Septoplasty and turbinate reduction  2007   Family History  Problem Relation Age of Onset  . Cancer Mother        Breast Cancer  . Breast cancer Mother 75  . Cancer Father        Colon Cancer  . Heart disease Father        Hx of MI  . Hypertension Father   . Diabetes Father   . Cancer Maternal Grandmother        lung cancer  . Cancer Maternal Uncle        esophageal   Social History   Social History  . Marital status: Married    Spouse name: N/A  . Number of children: N/A  . Years of education: N/A   Social History Main Topics  . Smoking status: Never Smoker  . Smokeless tobacco: Never  Used  . Alcohol use 0.0 oz/week     Comment: occasionally  . Drug use: No  . Sexual activity: Not Asked   Other Topics Concern  . None   Social History Narrative  . None    Outpatient Encounter Prescriptions as of 03/16/2017  Medication Sig  . albuterol (PROVENTIL HFA;VENTOLIN HFA) 108 (90 BASE) MCG/ACT inhaler Inhale 2 puffs into the lungs every 6 (six) hours as needed for wheezing or shortness of breath.  Marland Kitchen amLODipine (NORVASC) 5 MG tablet take 1 tablet by mouth once daily  . dexlansoprazole (DEXILANT) 60 MG capsule Take 60 mg by mouth daily.  . fexofenadine (ALLEGRA) 180 MG tablet Take 180 mg by mouth daily.  . folic acid (FOLVITE) 1 MG tablet Take 1 mg by mouth daily.  Marland Kitchen ibuprofen (ADVIL,MOTRIN) 800 MG tablet Take 800 mg by mouth 3 (three) times daily as needed. TID PRN for pain  . losartan (COZAAR) 50 MG tablet take 1 tablet by mouth once daily  . methotrexate (RHEUMATREX) 2.5 MG tablet Take 2.5 mg by mouth once a week. Caution:Chemotherapy. Protect from light.  Take 8 tabs po weekly with meals  . montelukast (SINGULAIR) 10 MG tablet Take 1 tablet (10 mg total) by mouth at bedtime.  Marland Kitchen ORENCIA 125 MG/ML SOSY   . ranitidine (ZANTAC) 150 MG tablet Take 150 mg by mouth 2 (two) times daily.  . traZODone (DESYREL) 50 MG tablet Take 0.5-1 tablets (25-50 mg total) by mouth at bedtime as needed for sleep.  . [DISCONTINUED] medroxyPROGESTERone (DEPO-PROVERA) 150 MG/ML injection Inject 150 mg into the muscle every 3 (three) months.  . [EXPIRED] medroxyPROGESTERone (DEPO-PROVERA) injection 150 mg    No facility-administered encounter medications on file as of 03/16/2017.     Review of Systems  Constitutional: Negative for appetite change and unexpected weight change.  HENT: Negative for sinus pressure.        Ear fullness as outlined.    Respiratory: Negative for cough, chest tightness and shortness of breath.   Cardiovascular: Negative for chest pain, palpitations and leg swelling.   Gastrointestinal: Negative for abdominal pain, diarrhea, nausea and vomiting.  Genitourinary: Negative for difficulty urinating and dysuria.  Musculoskeletal:       Right hip pain as outlined.  S/p injection of left knee.    Skin: Negative for color change and rash.  Neurological: Negative for dizziness, light-headedness and headaches.  Psychiatric/Behavioral: Negative for agitation and dysphoric mood.       Objective:    Physical Exam  Constitutional: She appears well-developed and well-nourished. No distress.  HENT:  Nose: Nose normal.  Mouth/Throat: Oropharynx is clear and moist.  TMs without erythema.    Neck: Neck supple. No thyromegaly present.  Cardiovascular: Normal rate and regular rhythm.   Pulmonary/Chest: Breath sounds normal. No respiratory distress. She has no wheezes.  Abdominal: Soft. Bowel sounds are normal. There is no tenderness.  Musculoskeletal: She exhibits no edema or tenderness.  Lymphadenopathy:    She has no cervical adenopathy.  Skin: No rash noted. No erythema.  Psychiatric: She has a normal mood and affect. Her behavior is normal.    BP 124/62 (BP Location: Left Arm, Patient Position: Sitting, Cuff Size: Large)   Pulse 81   Temp 98.5 F (36.9 C) (Oral)   Resp 14   Ht 5\' 2"  (1.575 m)   Wt 164 lb (74.4 kg)   SpO2 98%   BMI 30.00 kg/m  Wt Readings from Last 3 Encounters:  03/16/17 164 lb (74.4 kg)  01/19/17 165 lb (74.8 kg)  09/15/16 162 lb 9.6 oz (73.8 kg)     Lab Results  Component Value Date   WBC 9.0 01/19/2017   HGB 13.2 01/19/2017   HCT 39.1 01/19/2017   PLT 324.0 01/19/2017   GLUCOSE 85 01/19/2017   CHOL 187 01/19/2017   TRIG 57.0 01/19/2017   HDL 51.70 01/19/2017   LDLCALC 124 (H) 01/19/2017   ALT 14 01/19/2017   AST 17 01/19/2017   NA 137 01/19/2017   K 4.3 01/19/2017   CL 107 01/19/2017   CREATININE 0.95 01/19/2017   BUN 9 01/19/2017   CO2 24 01/19/2017   TSH 2.48 01/19/2017    Mm Digital Screening  Bilateral  Result Date: 01/01/2017 CLINICAL DATA:  Screening. EXAM: DIGITAL SCREENING BILATERAL MAMMOGRAM WITH CAD COMPARISON:  Previous exam(s). ACR Breast Density Category c: The breast tissue is heterogeneously dense, which may obscure small masses. FINDINGS: There are no findings suspicious for malignancy. Images were processed with CAD. IMPRESSION: No mammographic evidence of malignancy. A result letter of this screening mammogram will be mailed directly to  the patient. RECOMMENDATION: Screening mammogram in one year. (Code:SM-B-01Y) BI-RADS CATEGORY  1: Negative. Electronically Signed   By: Curlene Dolphin M.D.   On: 01/01/2017 16:31       Assessment & Plan:   Problem List Items Addressed This Visit    BMI 30.0-30.9,adult    Diet and exercise.  Follow.       Environmental allergies    Some ear fullness, but overall relatively stable.  Continue current medication regimen.  Keep f/u with ENT.       Essential hypertension, benign    Blood pressure under good control.  Continue same medication regimen.  Follow pressures.  Follow metabolic panel.        Relevant Orders   Basic metabolic panel   Family history of breast cancer    Mammogram 01/01/17 - Birads I.       GERD (gastroesophageal reflux disease)    On dexilant and zantac.  Follow.  Controlled.       Hypercholesterolemia    Low cholesterol diet and exercise.  Follow lipid panel.       Relevant Orders   Hepatic function panel   Lipid panel   Rheumatoid arthritis ([place])    Followed by Dr Jefm Bryant.        Snoring - Primary    Snoring and increased daytime somnolence and fatigue as outlined.  Schedule split night sleep study.        Relevant Orders   Ambulatory referral to Sleep Studies   Vitamin D deficiency    Follow vitamin D level.         Other Visit Diagnoses    Encounter for surveillance of injectable contraceptive       Relevant Medications   medroxyPROGESTERone (DEPO-PROVERA) injection 150 mg (Completed)    Need for immunization against influenza       Relevant Orders   Flu Vaccine QUAD 36+ mos IM (Completed)   Daytime somnolence       Relevant Orders   Ambulatory referral to Sleep Studies       Einar Pheasant, MD

## 2017-03-16 NOTE — Telephone Encounter (Signed)
Scheduled, thanks!

## 2017-03-19 ENCOUNTER — Encounter: Payer: Self-pay | Admitting: Internal Medicine

## 2017-03-19 NOTE — Assessment & Plan Note (Signed)
Diet and exercise.  Follow.  

## 2017-03-19 NOTE — Assessment & Plan Note (Signed)
Low cholesterol diet and exercise.  Follow lipid panel.   

## 2017-03-19 NOTE — Assessment & Plan Note (Signed)
Some ear fullness, but overall relatively stable.  Continue current medication regimen.  Keep f/u with ENT.

## 2017-03-19 NOTE — Assessment & Plan Note (Signed)
Blood pressure under good control.  Continue same medication regimen.  Follow pressures.  Follow metabolic panel.   

## 2017-03-19 NOTE — Assessment & Plan Note (Signed)
Snoring and increased daytime somnolence and fatigue as outlined.  Schedule split night sleep study.

## 2017-03-19 NOTE — Assessment & Plan Note (Signed)
Followed by Dr Kernodle.   

## 2017-03-19 NOTE — Assessment & Plan Note (Signed)
Mammogram 01/01/17 - Birads I.

## 2017-03-19 NOTE — Assessment & Plan Note (Signed)
Follow vitamin D level.  

## 2017-03-19 NOTE — Assessment & Plan Note (Signed)
On dexilant and zantac.  Follow.  Controlled.

## 2017-03-23 ENCOUNTER — Ambulatory Visit: Payer: 59

## 2017-04-07 DIAGNOSIS — H9041 Sensorineural hearing loss, unilateral, right ear, with unrestricted hearing on the contralateral side: Secondary | ICD-10-CM | POA: Diagnosis not present

## 2017-04-07 DIAGNOSIS — H6983 Other specified disorders of Eustachian tube, bilateral: Secondary | ICD-10-CM | POA: Diagnosis not present

## 2017-04-07 DIAGNOSIS — J310 Chronic rhinitis: Secondary | ICD-10-CM | POA: Diagnosis not present

## 2017-04-07 DIAGNOSIS — J343 Hypertrophy of nasal turbinates: Secondary | ICD-10-CM | POA: Diagnosis not present

## 2017-04-09 ENCOUNTER — Ambulatory Visit: Payer: 59 | Attending: Neurology

## 2017-04-10 ENCOUNTER — Other Ambulatory Visit (INDEPENDENT_AMBULATORY_CARE_PROVIDER_SITE_OTHER): Payer: Self-pay | Admitting: Otolaryngology

## 2017-04-10 DIAGNOSIS — H918X9 Other specified hearing loss, unspecified ear: Secondary | ICD-10-CM

## 2017-04-13 DIAGNOSIS — S76011A Strain of muscle, fascia and tendon of right hip, initial encounter: Secondary | ICD-10-CM | POA: Diagnosis not present

## 2017-04-13 DIAGNOSIS — M25851 Other specified joint disorders, right hip: Secondary | ICD-10-CM | POA: Diagnosis not present

## 2017-04-13 DIAGNOSIS — M79604 Pain in right leg: Secondary | ICD-10-CM | POA: Diagnosis not present

## 2017-04-15 ENCOUNTER — Ambulatory Visit: Payer: 59 | Attending: Neurology

## 2017-04-15 DIAGNOSIS — I10 Essential (primary) hypertension: Secondary | ICD-10-CM | POA: Diagnosis not present

## 2017-04-15 DIAGNOSIS — Z6831 Body mass index (BMI) 31.0-31.9, adult: Secondary | ICD-10-CM | POA: Insufficient documentation

## 2017-04-15 DIAGNOSIS — E669 Obesity, unspecified: Secondary | ICD-10-CM | POA: Insufficient documentation

## 2017-04-15 DIAGNOSIS — G4733 Obstructive sleep apnea (adult) (pediatric): Secondary | ICD-10-CM | POA: Diagnosis present

## 2017-04-15 DIAGNOSIS — G4730 Sleep apnea, unspecified: Secondary | ICD-10-CM | POA: Diagnosis not present

## 2017-04-15 DIAGNOSIS — F5101 Primary insomnia: Secondary | ICD-10-CM | POA: Diagnosis not present

## 2017-04-15 DIAGNOSIS — R0683 Snoring: Secondary | ICD-10-CM | POA: Insufficient documentation

## 2017-04-20 ENCOUNTER — Ambulatory Visit
Admission: RE | Admit: 2017-04-20 | Discharge: 2017-04-20 | Disposition: A | Discharge status: Home / Self Care | Payer: 59 | Source: Ambulatory Visit | Attending: Otolaryngology | Admitting: Otolaryngology

## 2017-04-20 DIAGNOSIS — H9041 Sensorineural hearing loss, unilateral, right ear, with unrestricted hearing on the contralateral side: Secondary | ICD-10-CM | POA: Diagnosis not present

## 2017-04-20 DIAGNOSIS — H918X9 Other specified hearing loss, unspecified ear: Secondary | ICD-10-CM

## 2017-04-20 MED ORDER — GADOBENATE DIMEGLUMINE 529 MG/ML IV SOLN
15.0000 mL | Freq: Once | INTRAVENOUS | Status: AC | PRN
  Administered 2017-04-20: 15 mL via INTRAVENOUS

## 2017-04-22 DIAGNOSIS — M461 Sacroiliitis, not elsewhere classified: Secondary | ICD-10-CM | POA: Diagnosis not present

## 2017-04-22 DIAGNOSIS — M25551 Pain in right hip: Secondary | ICD-10-CM | POA: Diagnosis not present

## 2017-04-27 ENCOUNTER — Ambulatory Visit (INDEPENDENT_AMBULATORY_CARE_PROVIDER_SITE_OTHER): Payer: 59 | Admitting: Orthopaedic Surgery

## 2017-04-27 ENCOUNTER — Ambulatory Visit (INDEPENDENT_AMBULATORY_CARE_PROVIDER_SITE_OTHER): Payer: 59

## 2017-04-27 ENCOUNTER — Encounter (INDEPENDENT_AMBULATORY_CARE_PROVIDER_SITE_OTHER): Payer: Self-pay | Admitting: Orthopaedic Surgery

## 2017-04-27 DIAGNOSIS — M25552 Pain in left hip: Secondary | ICD-10-CM

## 2017-04-27 DIAGNOSIS — M461 Sacroiliitis, not elsewhere classified: Secondary | ICD-10-CM | POA: Diagnosis not present

## 2017-04-27 DIAGNOSIS — M25551 Pain in right hip: Secondary | ICD-10-CM

## 2017-04-27 NOTE — Progress Notes (Signed)
Office Visit Note   Patient: Tina Parker           Date of Birth: [birth year]           MRN: [id number] Visit Date: 04/27/2017              Requested by: Einar Pheasant, [address] [place], [place] [id number] PCP: Einar Pheasant, MD   Assessment & Plan: Visit Diagnoses:  1. Pain of both hip joints     Plan: Impression is right hip pain.  I will schedule her for an intra-articular steroid injection of the right hip with Dr. Ernestina Patches to see if this gives her any benefit.  Follow-up in 3 weeks for recheck.  If not better will consider MRI.  Follow-Up Instructions: Return in about 3 weeks (around 05/18/2017).   Orders:  Orders Placed This Encounter  Procedures  . XR Pelvis 1-2 Views   No orders of the defined types were placed in this encounter.     Procedures: No procedures performed   Clinical Data: No additional findings.   Subjective: Chief Complaint  Patient presents with  . Right Hip - Pain    Patient is a 46 year old female comes in with bilateral hip pain worse on the right for several months.  The pain is worse with activity.  She does have mainly posterior lateral hip pain that radiates into the groin.  She has had 3-4 trochanteric bursa injections without any significant relief or prolonged relief.  She does have rheumatoid arthritis for which she takes methotrexate and Orencia for.  Pain is worse with prolonged standing.  Denies any radicular symptoms.  Denies any numbness and tingling.    Review of Systems  Constitutional: Negative.   HENT: Negative.   Eyes: Negative.   Respiratory: Negative.   Cardiovascular: Negative.   Endocrine: Negative.   Musculoskeletal: Negative.   Neurological: Negative.   Hematological: Negative.   Psychiatric/Behavioral: Negative.   All other systems reviewed and are negative.    Objective: Vital Signs: There were no vitals taken for this visit.  Physical Exam  Constitutional: She is  oriented to person, place, and time. She appears well-developed and well-nourished.  HENT:  Head: Normocephalic and atraumatic.  Eyes: EOM are normal.  Neck: Neck supple.  Pulmonary/Chest: Effort normal.  Abdominal: Soft.  Neurological: She is alert and oriented to person, place, and time.  Skin: Skin is warm. Capillary refill takes less than 2 seconds.  Psychiatric: She has a normal mood and affect. Her behavior is normal. Judgment and thought content normal.  Nursing note and vitals reviewed.   Ortho Exam Right hip exam shows mild pain with internal and external rotation radiates to the groin.  Lateral hip is slightly tender.  Negative Corky Sox and equivocal Stinchfield sign. Specialty Comments:  No specialty comments available.  Imaging: Xr Pelvis 1-2 Views  Result Date: 04/27/2017 No significant degenerative joint disease    PMFS History: Patient Active Problem List   Diagnosis Date Noted  . Vitamin D deficiency 09/21/2016  . Cough 01/07/2015  . Difficulty sleeping 01/07/2015  . Health care maintenance 08/02/2014  . Snoring 03/19/2014  . Dysphagia 01/15/2014  . BMI 30.0-30.9,adult 12/11/2013  . Skin lesion 12/11/2013  . Family history of breast cancer 10/22/2013  . Environmental allergies 10/22/2013  . Essential hypertension, benign 09/15/2012  . Hypercholesterolemia 09/15/2012  . Rheumatoid arthritis ([place]) 09/15/2012  . GERD (gastroesophageal reflux disease) 09/15/2012  . Family history of colon cancer 09/15/2012  .  Anemia 09/15/2012  . Hypothyroidism 09/15/2012   Past Medical History:  Diagnosis Date  . Allergic rhinitis   . Anemia   . Carpal tunnel syndrome   . Dysphagia   . GERD (gastroesophageal reflux disease)   . Graves disease    remission, no ablation, positive medical treatment  . Hypertension   . Migraine headache   . PPD positive    hepatitis secondary to [place]  . Pure hypercholesterolemia   . Rheumatoid arthritis(714.0)    positive anti CCP  antibodies, positive RF, oligo-articular, MTX    Family History  Problem Relation Age of Onset  . Cancer Mother        Breast Cancer  . Breast cancer Mother 37  . Cancer Father        Colon Cancer  . Heart disease Father        Hx of MI  . Hypertension Father   . Diabetes Father   . Cancer Maternal Grandmother        lung cancer  . Cancer Maternal Uncle        esophageal    Past Surgical History:  Procedure Laterality Date  . CESAREAN SECTION  1996  . Post Septoplasty and turbinate reduction  2007   Social History   Occupational History  . Not on file.   Social History Main Topics  . Smoking status: Never Smoker  . Smokeless tobacco: Never Used  . Alcohol use 0.0 oz/week     Comment: occasionally  . Drug use: No  . Sexual activity: Not on file

## 2017-04-28 DIAGNOSIS — D329 Benign neoplasm of meninges, unspecified: Secondary | ICD-10-CM | POA: Diagnosis not present

## 2017-04-28 DIAGNOSIS — I10 Essential (primary) hypertension: Secondary | ICD-10-CM | POA: Diagnosis not present

## 2017-04-29 ENCOUNTER — Other Ambulatory Visit: Payer: Self-pay | Admitting: Neurosurgery

## 2017-05-06 ENCOUNTER — Telehealth (INDEPENDENT_AMBULATORY_CARE_PROVIDER_SITE_OTHER): Payer: Self-pay | Admitting: Orthopaedic Surgery

## 2017-05-06 ENCOUNTER — Other Ambulatory Visit (INDEPENDENT_AMBULATORY_CARE_PROVIDER_SITE_OTHER): Payer: Self-pay

## 2017-05-06 DIAGNOSIS — M25551 Pain in right hip: Secondary | ICD-10-CM

## 2017-05-06 NOTE — Telephone Encounter (Signed)
Order made. Thank you.

## 2017-05-07 DIAGNOSIS — M25551 Pain in right hip: Secondary | ICD-10-CM | POA: Diagnosis not present

## 2017-05-07 DIAGNOSIS — M461 Sacroiliitis, not elsewhere classified: Secondary | ICD-10-CM | POA: Diagnosis not present

## 2017-05-11 DIAGNOSIS — M461 Sacroiliitis, not elsewhere classified: Secondary | ICD-10-CM | POA: Diagnosis not present

## 2017-05-11 DIAGNOSIS — M25551 Pain in right hip: Secondary | ICD-10-CM | POA: Diagnosis not present

## 2017-05-12 ENCOUNTER — Ambulatory Visit (INDEPENDENT_AMBULATORY_CARE_PROVIDER_SITE_OTHER): Payer: Self-pay

## 2017-05-12 ENCOUNTER — Ambulatory Visit (INDEPENDENT_AMBULATORY_CARE_PROVIDER_SITE_OTHER): Payer: 59 | Admitting: Physical Medicine and Rehabilitation

## 2017-05-12 ENCOUNTER — Encounter (INDEPENDENT_AMBULATORY_CARE_PROVIDER_SITE_OTHER): Payer: Self-pay | Admitting: Physical Medicine and Rehabilitation

## 2017-05-12 VITALS — BP 127/66 | HR 79

## 2017-05-12 DIAGNOSIS — M25551 Pain in right hip: Secondary | ICD-10-CM

## 2017-05-12 MED ORDER — TRIAMCINOLONE ACETONIDE 40 MG/ML IJ SUSP
80.0000 mg | INTRAMUSCULAR | Status: AC | PRN
  Administered 2017-05-12: 80 mg via INTRA_ARTICULAR

## 2017-05-12 MED ORDER — BUPIVACAINE HCL 0.5 % IJ SOLN
3.0000 mL | INTRAMUSCULAR | Status: AC | PRN
  Administered 2017-05-12: 3 mL via INTRA_ARTICULAR

## 2017-05-12 NOTE — Progress Notes (Deleted)
Reports right hip pain for the last year. No injury. Pain worse with activity. Has history of rheumatoid arthritis

## 2017-05-12 NOTE — Patient Instructions (Signed)

## 2017-05-12 NOTE — Progress Notes (Signed)
JOBY HERSHKOWITZ - 46 y.o. female MRN [id number]  Date of birth: [birth year]  Office Visit Note: Visit Date: 05/12/2017 PCP: Einar Pheasant, MD Referred by: Einar Pheasant, MD  Subjective: Chief Complaint  Patient presents with  . Right Hip - Pain   HPI: Ms. Debellis is a 46 year old female with right posterior hip and referral pain to the groin.  Worse with prolonged standing and movement.  She has x-ray imaging which is really unrevealing for degenerative disease.  She saw Dr. she recently who did feel like this was probably an intra-articular process periodically the diagnostic and hopefully therapeutic anesthetic hip arthrogram.  She does have rheumatoid arthritis.    ROS Otherwise per HPI.  Assessment & Plan: Visit Diagnoses:  1. Pain in right hip     Plan: Findings:  Diagnostic and therapeutic anesthetic hip arthrogram on the right.  Patient did seem to have some relief of the pressure in the hip as she was walking    Meds & Orders: No orders of the defined types were placed in this encounter.   Orders Placed This Encounter  Procedures  . Large Joint Inj: R hip joint  . XR C-ARM NO REPORT    Follow-up: Return if symptoms worsen or fail to improve, for Dr. Erlinda Hong.   Procedures: Large Joint Inj: R hip joint on 05/12/2017 3:09 PM Indications: pain and diagnostic evaluation Details: 22 G needle, anterior approach  Arthrogram: Yes  Medications: 80 mg triamcinolone acetonide 40 MG/ML; 3 mL bupivacaine 0.5 % Outcome: tolerated well, no immediate complications  Arthrogram demonstrated excellent flow of contrast throughout the joint surface without extravasation or obvious defect.  The patient had relief of symptoms during the anesthetic phase of the injection.  Procedure, treatment alternatives, risks and benefits explained, specific risks discussed. Consent was given by the patient. Immediately prior to procedure a time out was called to verify the correct patient,  procedure, equipment, support staff and site/side marked as required. Patient was prepped and draped in the usual sterile fashion.      No notes on file   Clinical History: No specialty comments available.  She reports that  has never smoked. she has never used smokeless tobacco. No results for input(s): HGBA1C, LABURIC in the last 8760 hours.  Objective:  VS:  HT:    WT:   BMI:     BP:127/66  HR:79bpm  TEMP: ( )  RESP:  Physical Exam  Ortho Exam Imaging: Xr C-arm No Report  Result Date: 05/12/2017 Please see Notes or Procedures tab for imaging impression.   Past Medical/Family/Surgical/Social History: Medications & Allergies reviewed per EMR Patient Active Problem List   Diagnosis Date Noted  . Vitamin D deficiency 09/21/2016  . Cough 01/07/2015  . Difficulty sleeping 01/07/2015  . Health care maintenance 08/02/2014  . Snoring 03/19/2014  . Dysphagia 01/15/2014  . BMI 30.0-30.9,adult 12/11/2013  . Skin lesion 12/11/2013  . Family history of breast cancer 10/22/2013  . Environmental allergies 10/22/2013  . Essential hypertension, benign 09/15/2012  . Hypercholesterolemia 09/15/2012  . Rheumatoid arthritis ([place]) 09/15/2012  . GERD (gastroesophageal reflux disease) 09/15/2012  . Family history of colon cancer 09/15/2012  . Anemia 09/15/2012  . Hypothyroidism 09/15/2012   Past Medical History:  Diagnosis Date  . Allergic rhinitis   . Anemia   . Carpal tunnel syndrome   . Dysphagia   . GERD (gastroesophageal reflux disease)   . Graves disease    remission, no ablation, positive medical treatment  .  Hypertension   . Migraine headache   . PPD positive    hepatitis secondary to [place]  . Pure hypercholesterolemia   . Rheumatoid arthritis(714.0)    positive anti CCP antibodies, positive RF, oligo-articular, MTX   Family History  Problem Relation Age of Onset  . Cancer Mother        Breast Cancer  . Breast cancer Mother 98  . Cancer Father        Colon  Cancer  . Heart disease Father        Hx of MI  . Hypertension Father   . Diabetes Father   . Cancer Maternal Grandmother        lung cancer  . Cancer Maternal Uncle        esophageal   Past Surgical History:  Procedure Laterality Date  . CESAREAN SECTION  1996  . Post Septoplasty and turbinate reduction  2007   Social History   Occupational History  . Not on file  Tobacco Use  . Smoking status: Never Smoker  . Smokeless tobacco: Never Used  Substance and Sexual Activity  . Alcohol use: Yes    Alcohol/week: 0.0 oz    Comment: occasionally  . Drug use: No  . Sexual activity: Not on file

## 2017-05-18 ENCOUNTER — Ambulatory Visit (INDEPENDENT_AMBULATORY_CARE_PROVIDER_SITE_OTHER): Payer: 59 | Admitting: Orthopaedic Surgery

## 2017-05-18 ENCOUNTER — Encounter (INDEPENDENT_AMBULATORY_CARE_PROVIDER_SITE_OTHER): Payer: Self-pay | Admitting: Orthopaedic Surgery

## 2017-05-18 DIAGNOSIS — M059 Rheumatoid arthritis with rheumatoid factor, unspecified: Secondary | ICD-10-CM

## 2017-05-18 DIAGNOSIS — M25551 Pain in right hip: Secondary | ICD-10-CM

## 2017-05-18 DIAGNOSIS — M25552 Pain in left hip: Secondary | ICD-10-CM | POA: Diagnosis not present

## 2017-05-18 NOTE — Addendum Note (Signed)
Addended by: Precious Bard on: 05/18/2017 09:08 AM   Modules accepted: Orders

## 2017-05-18 NOTE — Progress Notes (Signed)
Office Visit Note   Patient: Tina Parker           Date of Birth: Aug 05, 1970           MRN: [id number] Visit Date: 05/18/2017              Requested by: Einar Pheasant, MD [address] [place], [place] [address] PCP: Einar Pheasant, MD   Assessment & Plan: Visit Diagnoses:  1. Rheumatoid arthritis with positive rheumatoid factor, involving unspecified site ([place])   2. Pain of both hip joints     Plan: Impression is continued right hip pain that has helped in terms of the groin pain from the intra-articular steroid injection.  At this point recommend MR arthrogram of the right hip to rule out structural abnormality including abductor tendon tear.  Follow-up after the MRI.  Patient is scheduled to undergo resection of a meningioma in the near future.  Follow-Up Instructions: Return in about 2 weeks (around 06/01/2017).   Orders:  No orders of the defined types were placed in this encounter.  No orders of the defined types were placed in this encounter.     Procedures: No procedures performed   Clinical Data: No additional findings.   Subjective: Chief Complaint  Patient presents with  . Left Hip - Pain, Follow-up  . Right Hip - Pain, Follow-up    Patient comes back today for follow-up of her right hip pain.  The cortisone injection did help significantly for her right groin pain.  She continues to have right lateral hip pain.  She has had multiple injections to her trochanteric region.    Review of Systems   Objective: Vital Signs: There were no vitals taken for this visit.  Physical Exam  Ortho Exam Right hip exam is stable. Specialty Comments:  No specialty comments available.  Imaging: No results found.   PMFS History: Patient Active Problem List   Diagnosis Date Noted  . Pain of both hip joints 05/18/2017  . Vitamin D deficiency 09/21/2016  . Cough 01/07/2015  . Difficulty sleeping 01/07/2015  . Health care  maintenance 08/02/2014  . Snoring 03/19/2014  . Dysphagia 01/15/2014  . BMI 30.0-30.9,adult 12/11/2013  . Skin lesion 12/11/2013  . Family history of breast cancer 10/22/2013  . Environmental allergies 10/22/2013  . Essential hypertension, benign 09/15/2012  . Hypercholesterolemia 09/15/2012  . Rheumatoid arthritis ([place]) 09/15/2012  . GERD (gastroesophageal reflux disease) 09/15/2012  . Family history of colon cancer 09/15/2012  . Anemia 09/15/2012  . Hypothyroidism 09/15/2012   Past Medical History:  Diagnosis Date  . Allergic rhinitis   . Anemia   . Carpal tunnel syndrome   . Dysphagia   . GERD (gastroesophageal reflux disease)   . Graves disease    remission, no ablation, positive medical treatment  . Hypertension   . Migraine headache   . PPD positive    hepatitis secondary to [place]  . Pure hypercholesterolemia   . Rheumatoid arthritis(714.0)    positive anti CCP antibodies, positive RF, oligo-articular, MTX    Family History  Problem Relation Age of Onset  . Cancer Mother        Breast Cancer  . Breast cancer Mother 67  . Cancer Father        Colon Cancer  . Heart disease Father        Hx of MI  . Hypertension Father   . Diabetes Father   . Cancer Maternal Grandmother  lung cancer  . Cancer Maternal Uncle        esophageal    Past Surgical History:  Procedure Laterality Date  . CESAREAN SECTION  1996  . Post Septoplasty and turbinate reduction  2007   Social History   Occupational History  . Not on file  Tobacco Use  . Smoking status: Never Smoker  . Smokeless tobacco: Never Used  Substance and Sexual Activity  . Alcohol use: Yes    Alcohol/week: 0.0 oz    Comment: occasionally  . Drug use: No  . Sexual activity: Not on file

## 2017-05-25 DIAGNOSIS — M0579 Rheumatoid arthritis with rheumatoid factor of multiple sites without organ or systems involvement: Secondary | ICD-10-CM | POA: Diagnosis not present

## 2017-05-25 DIAGNOSIS — Z79899 Other long term (current) drug therapy: Secondary | ICD-10-CM | POA: Diagnosis not present

## 2017-05-26 DIAGNOSIS — H9041 Sensorineural hearing loss, unilateral, right ear, with unrestricted hearing on the contralateral side: Secondary | ICD-10-CM | POA: Diagnosis not present

## 2017-05-26 DIAGNOSIS — H6981 Other specified disorders of Eustachian tube, right ear: Secondary | ICD-10-CM | POA: Diagnosis not present

## 2017-05-26 DIAGNOSIS — J310 Chronic rhinitis: Secondary | ICD-10-CM | POA: Diagnosis not present

## 2017-05-27 ENCOUNTER — Other Ambulatory Visit (INDEPENDENT_AMBULATORY_CARE_PROVIDER_SITE_OTHER): Payer: Self-pay | Admitting: Orthopaedic Surgery

## 2017-05-28 ENCOUNTER — Other Ambulatory Visit (INDEPENDENT_AMBULATORY_CARE_PROVIDER_SITE_OTHER): Payer: Self-pay | Admitting: Orthopaedic Surgery

## 2017-05-28 DIAGNOSIS — M25551 Pain in right hip: Secondary | ICD-10-CM

## 2017-06-01 ENCOUNTER — Encounter (INDEPENDENT_AMBULATORY_CARE_PROVIDER_SITE_OTHER): Payer: Self-pay

## 2017-06-01 ENCOUNTER — Ambulatory Visit (INDEPENDENT_AMBULATORY_CARE_PROVIDER_SITE_OTHER): Payer: 59 | Admitting: Orthopaedic Surgery

## 2017-06-08 ENCOUNTER — Ambulatory Visit: Payer: 59 | Admitting: Internal Medicine

## 2017-06-10 ENCOUNTER — Ambulatory Visit: Payer: 59

## 2017-06-15 ENCOUNTER — Other Ambulatory Visit: Payer: 59

## 2017-06-15 ENCOUNTER — Ambulatory Visit: Payer: 59

## 2017-06-17 ENCOUNTER — Ambulatory Visit: Payer: 59 | Admitting: Internal Medicine

## 2017-06-19 NOTE — Pre-Procedure Instructions (Addendum)
Tina Parker  06/19/2017      [facility] [place], [place] [address] Phone: [phone] Fax: [phone]  [facility] [address] [place], [place] [address] Phone: [phone] Fax: [phone]    Your procedure is scheduled on Dec 27.  Report to [facility] Admitting at 1030 A.M.  Call this number if you have problems the morning of surgery:  [phone]   Remember:  Do not eat food or drink liquids after midnight.  Take these medicines the morning of surgery with A SIP OF WATER  Albuterol inhaler if needed, amlodipine (norvasc), Dexilant, Ranitidine (Zantac) if needed  Bring your inhalers with you on the day of surgery.  Stop taking aspirin, BC's, Goody's, herbal medications, Vitamins, Ibuprofen, Advil, Motrin, Aleve   Do not wear jewelry, make-up or nail polish.  Do not wear lotions, powders, or perfumes, or deodorant.  Do not shave 48 hours prior to surgery.  Men may shave face and neck.  Do not bring valuables to the hospital.  [facility] is not responsible for any belongings or valuables.  Contacts, dentures or bridgework may not be worn into surgery.  Leave your suitcase in the car.  After surgery it may be brought to your room.  For patients admitted to the hospital, discharge time will be determined by your treatment team.  Patients discharged the day of surgery will not be allowed to drive home.    Special instructions:  [facility] - Preparing for Surgery  Before surgery, you can play an important role.  Because skin is not sterile, your skin needs to be as free of germs as possible.  You can reduce the number of germs on you skin by washing with CHG (chlorahexidine gluconate) soap before surgery.  CHG is an antiseptic cleaner which kills germs and bonds with the skin to continue  killing germs even after washing.  Please DO NOT use if you have an allergy to CHG or antibacterial soaps.  If your skin becomes reddened/irritated stop using the CHG and inform your nurse when you arrive at Short Stay.  Do not shave (including legs and underarms) for at least 48 hours prior to the first CHG shower.  You may shave your face.  Please follow these instructions carefully:   1.  Shower with CHG Soap the night before surgery and the                                morning of Surgery.  2.  If you choose to wash your hair, wash your hair first as usual with your       normal shampoo.  3.  After you shampoo, rinse your hair and body thoroughly to remove the                      Shampoo.  4.  Use CHG as you would any other liquid soap.  You can apply chg directly       to the skin and wash gently with scrungie or a clean washcloth.  5.  Apply the CHG Soap to your body ONLY FROM THE NECK DOWN.        Do not use on open wounds or open sores.  Avoid contact with your  eyes,       ears, mouth and genitals (private parts).  Wash genitals (private parts)       with your normal soap.  6.  Wash thoroughly, paying special attention to the area where your surgery        will be performed.  7.  Thoroughly rinse your body with warm water from the neck down.  8.  DO NOT shower/wash with your normal soap after using and rinsing off       the CHG Soap.  9.  Pat yourself dry with a clean towel.            10.  Wear clean pajamas.            11.  Place clean sheets on your bed the night of your first shower and do not        sleep with pets.  Day of Surgery  Do not apply any lotions/deoderants the morning of surgery.  Please wear clean clothes to the hospital/surgery center.     Please read over the following fact sheets that you were given. Pain Booklet, Coughing and Deep Breathing and Surgical Site Infection Prevention

## 2017-06-21 ENCOUNTER — Other Ambulatory Visit: Payer: Self-pay | Admitting: Internal Medicine

## 2017-06-22 ENCOUNTER — Encounter (HOSPITAL_COMMUNITY): Payer: Self-pay

## 2017-06-22 ENCOUNTER — Other Ambulatory Visit: Payer: Self-pay

## 2017-06-22 ENCOUNTER — Encounter (HOSPITAL_COMMUNITY)
Admission: RE | Admit: 2017-06-22 | Discharge: 2017-06-22 | Disposition: A | Discharge status: Home / Self Care | Payer: 59 | Source: Ambulatory Visit | Attending: Neurosurgery | Admitting: Neurosurgery

## 2017-06-22 DIAGNOSIS — I10 Essential (primary) hypertension: Secondary | ICD-10-CM | POA: Diagnosis not present

## 2017-06-22 DIAGNOSIS — M069 Rheumatoid arthritis, unspecified: Secondary | ICD-10-CM | POA: Diagnosis not present

## 2017-06-22 DIAGNOSIS — D320 Benign neoplasm of cerebral meninges: Secondary | ICD-10-CM | POA: Diagnosis not present

## 2017-06-22 HISTORY — DX: Personal history of other diseases of the digestive system: Z87.19

## 2017-06-22 LAB — BASIC METABOLIC PANEL
Anion gap: 8 (ref 5–15)
BUN: 6 mg/dL (ref 6–20)
CO2: 25 mmol/L (ref 22–32)
Calcium: 9.7 mg/dL (ref 8.9–10.3)
Chloride: 106 mmol/L (ref 101–111)
Creatinine, Ser: 0.83 mg/dL (ref 0.44–1.00)
GFR calc Af Amer: >60 mL/min (ref >60)
GFR calc non Af Amer: >60 mL/min (ref >60)
Glucose, Bld: 91 mg/dL (ref 65–99)
Potassium: 4 mmol/L (ref 3.5–5.1)
Sodium: 139 mmol/L (ref 135–145)

## 2017-06-22 LAB — ABO/RH: ABO/RH(D): A POS

## 2017-06-22 LAB — TYPE AND SCREEN
ABO/RH(D): A POS
Antibody Screen: NEGATIVE

## 2017-06-22 LAB — CBC
HCT: 37.9 % (ref 36.0–46.0)
Hemoglobin: 12.6 g/dL (ref 12.0–15.0)
MCH: 30.8 pg (ref 26.0–34.0)
MCHC: 33.2 g/dL (ref 30.0–36.0)
MCV: 92.7 fL (ref 78.0–100.0)
Platelets: 322 10*3/uL (ref 150–400)
RBC: 4.09 MIL/uL (ref 3.87–5.11)
RDW: 14.3 % (ref 11.5–15.5)
WBC: 6.5 10*3/uL (ref 4.0–10.5)

## 2017-06-22 LAB — HCG, SERUM, QUALITATIVE: Preg, Serum: NEGATIVE

## 2017-06-22 NOTE — Progress Notes (Signed)
PCp is Dr. Einar Pheasant Denies ever seeing a cardiologist. Denies chest pain, fever, or cough. Denies  Ever having a stress test, card cath, or echo.

## 2017-06-24 NOTE — Anesthesia Preprocedure Evaluation (Addendum)
Anesthesia Evaluation  Patient identified by MRN, date of birth, ID band Patient awake    Reviewed: Allergy & Precautions, NPO status , Patient's Chart, lab work & pertinent test results  History of Anesthesia Complications Negative for: history of anesthetic complications  Airway Mallampati: II  TM Distance: >3 FB Neck ROM: Full    Dental no notable dental hx. (+) Dental Advisory Given   Pulmonary neg pulmonary ROS,    Pulmonary exam normal        Cardiovascular hypertension, Pt. on medications Normal cardiovascular exam     Neuro/Psych negative psych ROS   GI/Hepatic Neg liver ROS, hiatal hernia, GERD  ,  Endo/Other  Hypothyroidism   Renal/GU negative Renal ROS     Musculoskeletal negative musculoskeletal ROS (+)   Abdominal   Peds  Hematology negative hematology ROS (+)   Anesthesia Other Findings Day of surgery medications reviewed with the patient.  Reproductive/Obstetrics                            Anesthesia Physical Anesthesia Plan  ASA: III  Anesthesia Plan: General   Post-op Pain Management:    Induction: Intravenous  PONV Risk Score and Plan: 4 or greater and Ondansetron, Dexamethasone, Treatment may vary due to age or medical condition and Diphenhydramine  Airway Management Planned: Oral ETT  Additional Equipment: Arterial line  Intra-op Plan:   Post-operative Plan: Possible Post-op intubation/ventilation  Informed Consent: I have reviewed the patients History and Physical, chart, labs and discussed the procedure including the risks, benefits and alternatives for the proposed anesthesia with the patient or authorized representative who has indicated his/her understanding and acceptance.   Dental advisory given  Plan Discussed with: Anesthesiologist, Surgeon and CRNA  Anesthesia Plan Comments:        Anesthesia Quick Evaluation

## 2017-06-25 ENCOUNTER — Inpatient Hospital Stay (HOSPITAL_COMMUNITY): Payer: 59 | Admitting: Anesthesiology

## 2017-06-25 ENCOUNTER — Encounter (HOSPITAL_COMMUNITY): Payer: Self-pay | Admitting: Surgery

## 2017-06-25 ENCOUNTER — Inpatient Hospital Stay (HOSPITAL_COMMUNITY)
Admission: RE | Admit: 2017-06-25 | Discharge: 2017-06-29 | DRG: 027 | Disposition: A | Discharge status: Home / Self Care | Payer: 59 | Source: Ambulatory Visit | Attending: Neurosurgery | Admitting: Neurosurgery

## 2017-06-25 ENCOUNTER — Encounter (HOSPITAL_COMMUNITY)
Admission: RE | Disposition: A | Discharge status: Home / Self Care | Payer: Self-pay | Source: Ambulatory Visit | Attending: Neurosurgery

## 2017-06-25 DIAGNOSIS — Z833 Family history of diabetes mellitus: Secondary | ICD-10-CM | POA: Diagnosis not present

## 2017-06-25 DIAGNOSIS — D496 Neoplasm of unspecified behavior of brain: Secondary | ICD-10-CM | POA: Diagnosis present

## 2017-06-25 DIAGNOSIS — Z8249 Family history of ischemic heart disease and other diseases of the circulatory system: Secondary | ICD-10-CM | POA: Diagnosis not present

## 2017-06-25 DIAGNOSIS — K449 Diaphragmatic hernia without obstruction or gangrene: Secondary | ICD-10-CM | POA: Diagnosis present

## 2017-06-25 DIAGNOSIS — Z809 Family history of malignant neoplasm, unspecified: Secondary | ICD-10-CM | POA: Diagnosis not present

## 2017-06-25 DIAGNOSIS — Z888 Allergy status to other drugs, medicaments and biological substances status: Secondary | ICD-10-CM

## 2017-06-25 DIAGNOSIS — E039 Hypothyroidism, unspecified: Secondary | ICD-10-CM | POA: Diagnosis present

## 2017-06-25 DIAGNOSIS — I10 Essential (primary) hypertension: Secondary | ICD-10-CM | POA: Diagnosis not present

## 2017-06-25 DIAGNOSIS — K219 Gastro-esophageal reflux disease without esophagitis: Secondary | ICD-10-CM | POA: Diagnosis not present

## 2017-06-25 DIAGNOSIS — M069 Rheumatoid arthritis, unspecified: Secondary | ICD-10-CM | POA: Diagnosis present

## 2017-06-25 DIAGNOSIS — D329 Benign neoplasm of meninges, unspecified: Secondary | ICD-10-CM | POA: Diagnosis present

## 2017-06-25 DIAGNOSIS — D320 Benign neoplasm of cerebral meninges: Secondary | ICD-10-CM | POA: Diagnosis not present

## 2017-06-25 HISTORY — PX: CRANIOTOMY: SHX93

## 2017-06-25 LAB — MRSA PCR SCREENING: MRSA by PCR: NEGATIVE

## 2017-06-25 SURGERY — CRANIOTOMY TUMOR EXCISION
Anesthesia: General | Site: Head | Laterality: Left

## 2017-06-25 MED ORDER — DEXAMETHASONE SODIUM PHOSPHATE 10 MG/ML IJ SOLN
INTRAMUSCULAR | Status: AC
  Filled 2017-06-25: qty 1

## 2017-06-25 MED ORDER — ONDANSETRON HCL 4 MG/2ML IJ SOLN
INTRAMUSCULAR | Status: AC
  Filled 2017-06-25: qty 2

## 2017-06-25 MED ORDER — ALBUTEROL SULFATE (2.5 MG/3ML) 0.083% IN NEBU: 2.5000 mg | INHALATION_SOLUTION | Freq: Four times a day (QID) | RESPIRATORY_TRACT | Status: DC | PRN

## 2017-06-25 MED ORDER — PHENYLEPHRINE HCL 10 MG/ML IJ SOLN
INTRAVENOUS | Status: DC | PRN
  Administered 2017-06-25: 25 ug/min via INTRAVENOUS

## 2017-06-25 MED ORDER — LIDOCAINE HCL (CARDIAC) 20 MG/ML IV SOLN
INTRAVENOUS | Status: DC | PRN
  Administered 2017-06-25: 100 mg via INTRATRACHEAL

## 2017-06-25 MED ORDER — DEXAMETHASONE SODIUM PHOSPHATE 10 MG/ML IJ SOLN
INTRAMUSCULAR | Status: DC | PRN
  Administered 2017-06-25: 10 mg via INTRAVENOUS

## 2017-06-25 MED ORDER — LIDOCAINE 2% (20 MG/ML) 5 ML SYRINGE
INTRAMUSCULAR | Status: AC
  Filled 2017-06-25: qty 5

## 2017-06-25 MED ORDER — SENNOSIDES-DOCUSATE SODIUM 8.6-50 MG PO TABS: 1.0000 | ORAL_TABLET | Freq: Every evening | ORAL | Status: DC | PRN

## 2017-06-25 MED ORDER — ROCURONIUM BROMIDE 10 MG/ML (PF) SYRINGE
PREFILLED_SYRINGE | INTRAVENOUS | Status: AC
Start: 2017-06-25 — End: 2017-06-25
  Filled 2017-06-25: qty 5

## 2017-06-25 MED ORDER — METHOTREXATE 2.5 MG PO TABS: 22.5000 mg | ORAL_TABLET | ORAL | Status: DC

## 2017-06-25 MED ORDER — CETIRIZINE HCL 10 MG PO TABS
10.0000 mg | ORAL_TABLET | Freq: Every day | ORAL | Status: DC
  Administered 2017-06-25 – 2017-06-28 (×4): 10 mg via ORAL
  Filled 2017-06-25 (×4): qty 1

## 2017-06-25 MED ORDER — MONTELUKAST SODIUM 10 MG PO TABS
10.0000 mg | ORAL_TABLET | Freq: Every day | ORAL | Status: DC
  Administered 2017-06-25 – 2017-06-28 (×4): 10 mg via ORAL
  Filled 2017-06-25 (×4): qty 1

## 2017-06-25 MED ORDER — NALOXONE HCL 0.4 MG/ML IJ SOLN: 0.0800 mg | INTRAMUSCULAR | Status: DC | PRN

## 2017-06-25 MED ORDER — ROCURONIUM BROMIDE 100 MG/10ML IV SOLN
INTRAVENOUS | Status: DC | PRN
  Administered 2017-06-25: 10 mg via INTRAVENOUS
  Administered 2017-06-25: 30 mg via INTRAVENOUS
  Administered 2017-06-25: 70 mg via INTRAVENOUS

## 2017-06-25 MED ORDER — LOSARTAN POTASSIUM 50 MG PO TABS
50.0000 mg | ORAL_TABLET | Freq: Every day | ORAL | Status: DC
  Administered 2017-06-26 – 2017-06-29 (×4): 50 mg via ORAL
  Filled 2017-06-25 (×4): qty 1

## 2017-06-25 MED ORDER — PROMETHAZINE HCL 25 MG/ML IJ SOLN: 6.2500 mg | INTRAMUSCULAR | Status: DC | PRN

## 2017-06-25 MED ORDER — PROPOFOL 10 MG/ML IV BOLUS
INTRAVENOUS | Status: AC
  Filled 2017-06-25: qty 20

## 2017-06-25 MED ORDER — CHLORHEXIDINE GLUCONATE CLOTH 2 % EX PADS: 6.0000 | MEDICATED_PAD | Freq: Once | CUTANEOUS | Status: DC

## 2017-06-25 MED ORDER — PROPOFOL 10 MG/ML IV BOLUS
INTRAVENOUS | Status: DC | PRN
  Administered 2017-06-25: 200 mg via INTRAVENOUS

## 2017-06-25 MED ORDER — CEFAZOLIN SODIUM-DEXTROSE 2-4 GM/100ML-% IV SOLN
2.0000 g | INTRAVENOUS | Status: AC
  Administered 2017-06-25: 2 g via INTRAVENOUS

## 2017-06-25 MED ORDER — FENTANYL CITRATE (PF) 250 MCG/5ML IJ SOLN
INTRAMUSCULAR | Status: AC
  Filled 2017-06-25: qty 5

## 2017-06-25 MED ORDER — ROCURONIUM BROMIDE 10 MG/ML (PF) SYRINGE
PREFILLED_SYRINGE | INTRAVENOUS | Status: AC
  Filled 2017-06-25: qty 5

## 2017-06-25 MED ORDER — BISACODYL 5 MG PO TBEC
5.0000 mg | DELAYED_RELEASE_TABLET | Freq: Every day | ORAL | Status: DC | PRN
  Administered 2017-06-28: 5 mg via ORAL
  Filled 2017-06-25: qty 1

## 2017-06-25 MED ORDER — AMLODIPINE BESYLATE 5 MG PO TABS
5.0000 mg | ORAL_TABLET | Freq: Every day | ORAL | Status: DC
  Administered 2017-06-26 – 2017-06-29 (×4): 5 mg via ORAL
  Filled 2017-06-25 (×4): qty 1

## 2017-06-25 MED ORDER — DEXAMETHASONE 6 MG PO TABS
6.0000 mg | ORAL_TABLET | Freq: Four times a day (QID) | ORAL | Status: AC
  Administered 2017-06-25 – 2017-06-26 (×4): 6 mg via ORAL
  Filled 2017-06-25 (×4): qty 1

## 2017-06-25 MED ORDER — BACITRACIN ZINC 500 UNIT/GM EX OINT
TOPICAL_OINTMENT | CUTANEOUS | Status: DC | PRN
  Administered 2017-06-25: 1 via TOPICAL

## 2017-06-25 MED ORDER — THROMBIN (RECOMBINANT) 20000 UNITS EX SOLR
CUTANEOUS | Status: DC | PRN
  Administered 2017-06-25: 20000 [IU] via TOPICAL

## 2017-06-25 MED ORDER — HEMOSTATIC AGENTS (NO CHARGE) OPTIME
TOPICAL | Status: DC | PRN
  Administered 2017-06-25 (×2): 1 via TOPICAL

## 2017-06-25 MED ORDER — CEFAZOLIN SODIUM-DEXTROSE 2-4 GM/100ML-% IV SOLN
INTRAVENOUS | Status: AC
  Filled 2017-06-25: qty 100

## 2017-06-25 MED ORDER — PANTOPRAZOLE SODIUM 40 MG PO TBEC
40.0000 mg | DELAYED_RELEASE_TABLET | Freq: Every day | ORAL | Status: DC
  Administered 2017-06-26 – 2017-06-29 (×4): 40 mg via ORAL
  Filled 2017-06-25 (×4): qty 1

## 2017-06-25 MED ORDER — FENTANYL CITRATE (PF) 250 MCG/5ML IJ SOLN
INTRAMUSCULAR | Status: DC | PRN
  Administered 2017-06-25 (×2): 100 ug via INTRAVENOUS
  Administered 2017-06-25: 150 ug via INTRAVENOUS

## 2017-06-25 MED ORDER — MIDAZOLAM HCL 2 MG/2ML IJ SOLN
INTRAMUSCULAR | Status: AC
  Filled 2017-06-25: qty 2

## 2017-06-25 MED ORDER — SUGAMMADEX SODIUM 500 MG/5ML IV SOLN
INTRAVENOUS | Status: AC
  Filled 2017-06-25: qty 5

## 2017-06-25 MED ORDER — DEXAMETHASONE 4 MG PO TABS
4.0000 mg | ORAL_TABLET | Freq: Three times a day (TID) | ORAL | Status: DC
  Administered 2017-06-27 – 2017-06-29 (×5): 4 mg via ORAL
  Filled 2017-06-25 (×6): qty 1

## 2017-06-25 MED ORDER — SUGAMMADEX SODIUM 500 MG/5ML IV SOLN
INTRAVENOUS | Status: DC | PRN
  Administered 2017-06-25: 300 mg via INTRAVENOUS

## 2017-06-25 MED ORDER — HYDROMORPHONE HCL 1 MG/ML IJ SOLN
INTRAMUSCULAR | Status: AC
  Administered 2017-06-25: 0.5 mg via INTRAVENOUS
  Filled 2017-06-25: qty 1

## 2017-06-25 MED ORDER — LABETALOL HCL 5 MG/ML IV SOLN
INTRAVENOUS | Status: DC | PRN
  Administered 2017-06-25 (×2): 5 mg via INTRAVENOUS

## 2017-06-25 MED ORDER — SODIUM CHLORIDE 0.9 % IV SOLN
500.0000 mg | INTRAVENOUS | Status: AC
  Administered 2017-06-25: 500 mg via INTRAVENOUS
  Filled 2017-06-25: qty 5

## 2017-06-25 MED ORDER — ALBUTEROL SULFATE HFA 108 (90 BASE) MCG/ACT IN AERS: 2.0000 | INHALATION_SPRAY | Freq: Four times a day (QID) | RESPIRATORY_TRACT | Status: DC | PRN

## 2017-06-25 MED ORDER — ABATACEPT 125 MG/ML ~~LOC~~ SOSY: 125.0000 mg | PREFILLED_SYRINGE | SUBCUTANEOUS | Status: DC

## 2017-06-25 MED ORDER — MAGNESIUM CITRATE PO SOLN: 1.0000 | Freq: Once | ORAL | Status: DC | PRN

## 2017-06-25 MED ORDER — POTASSIUM CHLORIDE IN NACL 20-0.9 MEQ/L-% IV SOLN
INTRAVENOUS | Status: DC
  Administered 2017-06-25 – 2017-06-27 (×4): via INTRAVENOUS
  Filled 2017-06-25 (×6): qty 1000

## 2017-06-25 MED ORDER — HYDROCODONE-ACETAMINOPHEN 5-325 MG PO TABS
1.0000 | ORAL_TABLET | ORAL | Status: DC | PRN
  Administered 2017-06-25 – 2017-06-29 (×7): 1 via ORAL
  Filled 2017-06-25 (×7): qty 1

## 2017-06-25 MED ORDER — FAMOTIDINE 20 MG PO TABS: 20.0000 mg | ORAL_TABLET | Freq: Every day | ORAL | Status: DC

## 2017-06-25 MED ORDER — HYDROMORPHONE HCL 1 MG/ML IJ SOLN
0.2500 mg | INTRAMUSCULAR | Status: DC | PRN
  Administered 2017-06-25 (×2): 0.25 mg via INTRAVENOUS
  Administered 2017-06-25: 0.5 mg via INTRAVENOUS

## 2017-06-25 MED ORDER — LIDOCAINE-EPINEPHRINE 0.5 %-1:200000 IJ SOLN
INTRAMUSCULAR | Status: DC | PRN
  Administered 2017-06-25: 10 mL

## 2017-06-25 MED ORDER — LEVOCETIRIZINE DIHYDROCHLORIDE 5 MG PO TABS: 5.0000 mg | ORAL_TABLET | Freq: Every day | ORAL | Status: DC

## 2017-06-25 MED ORDER — DOCUSATE SODIUM 100 MG PO CAPS
100.0000 mg | ORAL_CAPSULE | Freq: Two times a day (BID) | ORAL | Status: DC
  Administered 2017-06-25 – 2017-06-29 (×8): 100 mg via ORAL
  Filled 2017-06-25 (×8): qty 1

## 2017-06-25 MED ORDER — THROMBIN (RECOMBINANT) 20000 UNITS EX SOLR
CUTANEOUS | Status: AC
  Filled 2017-06-25: qty 20000

## 2017-06-25 MED ORDER — LABETALOL HCL 5 MG/ML IV SOLN: 10.0000 mg | INTRAVENOUS | Status: DC | PRN

## 2017-06-25 MED ORDER — 0.9 % SODIUM CHLORIDE (POUR BTL) OPTIME
TOPICAL | Status: DC | PRN
  Administered 2017-06-25 (×2): 1000 mL

## 2017-06-25 MED ORDER — MANNITOL 25 % IV SOLN
37.5000 g | INTRAVENOUS | Status: AC
  Administered 2017-06-25: 37.5 g via INTRAVENOUS
  Filled 2017-06-25: qty 150

## 2017-06-25 MED ORDER — MORPHINE SULFATE (PF) 4 MG/ML IV SOLN
1.0000 mg | INTRAVENOUS | Status: DC | PRN
  Administered 2017-06-25 – 2017-06-28 (×13): 2 mg via INTRAVENOUS
  Filled 2017-06-25 (×13): qty 1

## 2017-06-25 MED ORDER — LIDOCAINE-EPINEPHRINE 0.5 %-1:200000 IJ SOLN
INTRAMUSCULAR | Status: AC
  Filled 2017-06-25: qty 1

## 2017-06-25 MED ORDER — ONDANSETRON HCL 4 MG/2ML IJ SOLN
INTRAMUSCULAR | Status: DC | PRN
  Administered 2017-06-25: 4 mg via INTRAVENOUS

## 2017-06-25 MED ORDER — SODIUM CHLORIDE 0.9 % IV SOLN
INTRAVENOUS | Status: AC | PRN
  Administered 2017-06-25: 1000 mL

## 2017-06-25 MED ORDER — THROMBIN (RECOMBINANT) 5000 UNITS EX SOLR
CUTANEOUS | Status: AC
  Filled 2017-06-25: qty 5000

## 2017-06-25 MED ORDER — ONDANSETRON HCL 4 MG PO TABS: 4.0000 mg | ORAL_TABLET | ORAL | Status: DC | PRN

## 2017-06-25 MED ORDER — ONDANSETRON HCL 4 MG/2ML IJ SOLN
4.0000 mg | INTRAMUSCULAR | Status: DC | PRN
  Administered 2017-06-25: 4 mg via INTRAVENOUS
  Filled 2017-06-25: qty 2

## 2017-06-25 MED ORDER — FENTANYL CITRATE (PF) 100 MCG/2ML IJ SOLN
INTRAMUSCULAR | Status: AC
  Filled 2017-06-25: qty 2

## 2017-06-25 MED ORDER — PROMETHAZINE HCL 25 MG PO TABS: 12.5000 mg | ORAL_TABLET | ORAL | Status: DC | PRN

## 2017-06-25 MED ORDER — SODIUM CHLORIDE 0.9 % IV SOLN
INTRAVENOUS | Status: DC
  Administered 2017-06-25 (×4): via INTRAVENOUS

## 2017-06-25 MED ORDER — BACITRACIN ZINC 500 UNIT/GM EX OINT
TOPICAL_OINTMENT | CUTANEOUS | Status: AC
  Filled 2017-06-25: qty 28.35

## 2017-06-25 MED ORDER — PHENYLEPHRINE 40 MCG/ML (10ML) SYRINGE FOR IV PUSH (FOR BLOOD PRESSURE SUPPORT)
PREFILLED_SYRINGE | INTRAVENOUS | Status: AC
  Filled 2017-06-25: qty 10

## 2017-06-25 MED ORDER — LABETALOL HCL 5 MG/ML IV SOLN
INTRAVENOUS | Status: AC
  Filled 2017-06-25: qty 4

## 2017-06-25 MED ORDER — SODIUM CHLORIDE 0.9 % IV SOLN
500.0000 mg | Freq: Two times a day (BID) | INTRAVENOUS | Status: DC
  Administered 2017-06-25 – 2017-06-28 (×6): 500 mg via INTRAVENOUS
  Filled 2017-06-25 (×7): qty 5

## 2017-06-25 MED ORDER — DEXAMETHASONE 4 MG PO TABS
4.0000 mg | ORAL_TABLET | Freq: Four times a day (QID) | ORAL | Status: AC
  Administered 2017-06-26 – 2017-06-27 (×4): 4 mg via ORAL
  Filled 2017-06-25 (×4): qty 1

## 2017-06-25 MED ORDER — MIDAZOLAM HCL 5 MG/5ML IJ SOLN
INTRAMUSCULAR | Status: DC | PRN
  Administered 2017-06-25: 2 mg via INTRAVENOUS

## 2017-06-25 SURGICAL SUPPLY — 98 items
BENZOIN TINCTURE PRP APPL 2/3 (GAUZE/BANDAGES/DRESSINGS) IMPLANT
BLADE CLIPPER SURG (BLADE) ×2 IMPLANT
BLADE SAW GIGLI 16 STRL (MISCELLANEOUS) IMPLANT
BLADE SURG 11 STRL SS (BLADE) ×2 IMPLANT
BLADE SURG 15 STRL LF DISP TIS (BLADE) IMPLANT
BLADE SURG 15 STRL SS (BLADE)
BLADE ULTRA TIP 2M (BLADE) IMPLANT
BNDG GAUZE ELAST 4 BULKY (GAUZE/BANDAGES/DRESSINGS) ×4 IMPLANT
BNDG STRETCH 4X75 NS LF (GAUZE/BANDAGES/DRESSINGS) ×2 IMPLANT
BUR ACORN 6.0 PRECISION (BURR) ×2 IMPLANT
BUR MATCHSTICK NEURO 3.0 LAGG (BURR) IMPLANT
BUR SPIRAL ROUTER 2.3 (BUR) ×2 IMPLANT
CANISTER SUCT 3000ML PPV (MISCELLANEOUS) ×4 IMPLANT
CARTRIDGE OIL MAESTRO DRILL (MISCELLANEOUS) ×1 IMPLANT
CATH VENTRIC 35X38 W/TROCAR LG (CATHETERS) IMPLANT
CLIP VESOCCLUDE MED 6/CT (CLIP) IMPLANT
CONT SPEC 4OZ CLIKSEAL STRL BL (MISCELLANEOUS) ×4 IMPLANT
DECANTER SPIKE VIAL GLASS SM (MISCELLANEOUS) ×2 IMPLANT
DIFFUSER DRILL AIR PNEUMATIC (MISCELLANEOUS) ×2 IMPLANT
DRAIN SUBARACHNOID (WOUND CARE) IMPLANT
DRAPE CAMERA VIDEO/LASER (DRAPES) IMPLANT
DRAPE MICROSCOPE LEICA (MISCELLANEOUS) ×2 IMPLANT
DRAPE NEUROLOGICAL W/INCISE (DRAPES) ×2 IMPLANT
DRAPE ORTHO SPLIT 77X108 STRL (DRAPES)
DRAPE SURG 17X23 STRL (DRAPES) IMPLANT
DRAPE SURG ORHT 6 SPLT 77X108 (DRAPES) IMPLANT
DRAPE WARM FLUID 44X44 (DRAPE) ×2 IMPLANT
DURAPREP 6ML APPLICATOR 50/CS (WOUND CARE) ×2 IMPLANT
ELECT REM PT RETURN 9FT ADLT (ELECTROSURGICAL) ×2
ELECTRODE REM PT RTRN 9FT ADLT (ELECTROSURGICAL) ×1 IMPLANT
EVACUATOR 1/8 PVC DRAIN (DRAIN) IMPLANT
EVACUATOR SILICONE 100CC (DRAIN) IMPLANT
FORCEPS BIPOLAR SPETZLER 8 1.0 (NEUROSURGERY SUPPLIES) ×2 IMPLANT
GAUZE SPONGE 4X4 12PLY STRL (GAUZE/BANDAGES/DRESSINGS) ×2 IMPLANT
GAUZE SPONGE 4X4 16PLY XRAY LF (GAUZE/BANDAGES/DRESSINGS) IMPLANT
GLOVE BIO SURGEON STRL SZ8 (GLOVE) ×4 IMPLANT
GLOVE BIOGEL PI IND STRL 6.5 (GLOVE) ×3 IMPLANT
GLOVE BIOGEL PI IND STRL 8 (GLOVE) ×6 IMPLANT
GLOVE BIOGEL PI IND STRL 8.5 (GLOVE) ×2 IMPLANT
GLOVE BIOGEL PI INDICATOR 6.5 (GLOVE) ×3
GLOVE BIOGEL PI INDICATOR 8 (GLOVE) ×6
GLOVE BIOGEL PI INDICATOR 8.5 (GLOVE) ×2
GLOVE ECLIPSE 6.5 STRL STRAW (GLOVE) ×2 IMPLANT
GLOVE ECLIPSE 7.5 STRL STRAW (GLOVE) ×6 IMPLANT
GLOVE EXAM NITRILE LRG STRL (GLOVE) IMPLANT
GLOVE EXAM NITRILE XL STR (GLOVE) IMPLANT
GLOVE EXAM NITRILE XS STR PU (GLOVE) IMPLANT
GLOVE SURG SS PI 6.0 STRL IVOR (GLOVE) ×10 IMPLANT
GOWN STRL REUS W/ TWL LRG LVL3 (GOWN DISPOSABLE) ×5 IMPLANT
GOWN STRL REUS W/ TWL XL LVL3 (GOWN DISPOSABLE) ×2 IMPLANT
GOWN STRL REUS W/TWL 2XL LVL3 (GOWN DISPOSABLE) ×6 IMPLANT
GOWN STRL REUS W/TWL LRG LVL3 (GOWN DISPOSABLE) ×5
GOWN STRL REUS W/TWL XL LVL3 (GOWN DISPOSABLE) ×2
HEMOSTAT SURGICEL 2X14 (HEMOSTASIS) ×2 IMPLANT
HOOK DURA 1/2IN (MISCELLANEOUS) ×2 IMPLANT
IV NS 1000ML (IV SOLUTION) ×1
IV NS 1000ML BAXH (IV SOLUTION) ×1 IMPLANT
KIT BASIN OR (CUSTOM PROCEDURE TRAY) ×2 IMPLANT
KIT DRAIN CSF ACCUDRAIN (MISCELLANEOUS) IMPLANT
KIT ROOM TURNOVER OR (KITS) ×2 IMPLANT
NEEDLE HYPO 25X1 1.5 SAFETY (NEEDLE) ×2 IMPLANT
NEEDLE SPNL 18GX3.5 QUINCKE PK (NEEDLE) IMPLANT
NS IRRIG 1000ML POUR BTL (IV SOLUTION) ×4 IMPLANT
OIL CARTRIDGE MAESTRO DRILL (MISCELLANEOUS) ×2
PACK CRANIOTOMY (CUSTOM PROCEDURE TRAY) ×2 IMPLANT
PATTIES SURGICAL .25X.25 (GAUZE/BANDAGES/DRESSINGS) IMPLANT
PATTIES SURGICAL .5 X.5 (GAUZE/BANDAGES/DRESSINGS) IMPLANT
PATTIES SURGICAL .5 X3 (DISPOSABLE) IMPLANT
PATTIES SURGICAL 1/4 X 3 (GAUZE/BANDAGES/DRESSINGS) IMPLANT
PATTIES SURGICAL 1X1 (DISPOSABLE) IMPLANT
PLATE 1.5  2HOLE LNG NEURO (Plate) ×3 IMPLANT
PLATE 1.5 2HOLE LNG NEURO (Plate) ×3 IMPLANT
RUBBERBAND STERILE (MISCELLANEOUS) ×4 IMPLANT
SCREW SELF DRILL HT 1.5/4MM (Screw) ×12 IMPLANT
SET CARTRIDGE AND TUBING (SET/KITS/TRAYS/PACK) ×2 IMPLANT
SPECIMEN JAR SMALL (MISCELLANEOUS) IMPLANT
SPONGE NEURO XRAY DETECT 1X3 (DISPOSABLE) IMPLANT
SPONGE SURGIFOAM ABS GEL 100 (HEMOSTASIS) ×2 IMPLANT
STAPLER VISISTAT 35W (STAPLE) ×2 IMPLANT
SUT ETHILON 3 0 FSL (SUTURE) IMPLANT
SUT ETHILON 3 0 PS 1 (SUTURE) IMPLANT
SUT NURALON 4 0 TR CR/8 (SUTURE) ×4 IMPLANT
SUT SILK 0 TIES 10X30 (SUTURE) IMPLANT
SUT STEEL 0 (SUTURE)
SUT STEEL 0 18XMFL TIE 17 (SUTURE) IMPLANT
SUT VIC AB 2-0 CT2 18 VCP726D (SUTURE) ×6 IMPLANT
TAPE SURG TRANSPORE 1 IN (GAUZE/BANDAGES/DRESSINGS) ×1 IMPLANT
TAPE SURGICAL TRANSPORE 1 IN (GAUZE/BANDAGES/DRESSINGS) ×1
TIP SHEAR CVD EXTENDED 36KH (INSTRUMENTS) ×2 IMPLANT
TIP STANDARD 36KHZ (INSTRUMENTS) ×2
TIP STD 36KHZ (INSTRUMENTS) ×1 IMPLANT
TOWEL GREEN STERILE (TOWEL DISPOSABLE) ×2 IMPLANT
TOWEL GREEN STERILE FF (TOWEL DISPOSABLE) ×2 IMPLANT
TRAY FOLEY W/METER SILVER 16FR (SET/KITS/TRAYS/PACK) ×2 IMPLANT
TUBE CONNECTING 12X1/4 (SUCTIONS) IMPLANT
UNDERPAD 30X30 (UNDERPADS AND DIAPERS) IMPLANT
WATER STERILE IRR 1000ML POUR (IV SOLUTION) ×2 IMPLANT
WRENCH TORQUE 36KHZ (INSTRUMENTS) ×2 IMPLANT

## 2017-06-25 NOTE — Op Note (Signed)
06/25/2017  5:01 PM  PATIENT:  Tina Parker  46 y.o. female  PRE-OPERATIVE DIAGNOSIS:  MENINGIOMA, left sphenoid wing  POST-OPERATIVE DIAGNOSIS:  MENINGIOMA, left sphenoid wing  PROCEDURE:  Procedure(s): CRANIOTOMY TEMPORAL LEFT FOR TUMOR RESECTION Microdissection SURGEON: Surgeon(s): Ashok Pall, MD Erline Levine, MD  ASSISTANTS:Stern, Broadus John  ANESTHESIA:   general  EBL:  Total I/O In: 1600 [I.V.:1600] Out: 1300 [Urine:1100; Blood:200]  BLOOD ADMINISTERED:none  CELL SAVER GIVEN:none  COUNT:per nursing  DRAINS: none   SPECIMEN:  No Specimen  DICTATION: [name] was taken to the operating room, intubated, and placed under a general anesthetic without difficulty. Once anesthesia was adequate I placed her head in a three pin head holder. I then shaved and prepped her head. She was positioned her head turned towards the right exposing the left temporal region. I infiltrated lidocaine into the planned incision to expose the pterion and middle fossa.  I opened the skin with a 10 blade, placed Raney clips along the scalp edges. I developed the flap rostrally, then used cautery to create a flap in the temporalis muscle. I reflected the scalp rostrally with the temporalis and exposed the pterion and the keyhole. I drilled a burrhole in the keyhole and inferior temporal bone. I used the craniotome to connect the two. I removed the flap with Dr. Melven Sartorius assistance. We then used the drill to flatten the approach taking down the bone along the skull base, and the clinoid extradurally. Once done I opened the dura and exposed the sylvian fissure, frontal lobe, and temporal lobe. I retracted the frontal lobe and opened the frontal cisterns to let out csf and relax the brain. I then retracted the temporal lobe and frontal lobe and exposed the tumor. I left a retractor on the temporal lobe and had enough relaxation on the frontal lobe that I did not need a retractor. With microscopic  dissection we started the mass resection.  I used the bipolar cautery to shrink the tumor capsule and to develop a plane between the tumor and the brain. I placed cottonoid patties on the brain surface for protection while resecting the tumor. Frozen samples confirmed meningioma. I used cautery and the [place] to remove the tumor. Dr. Vertell Limber and I dissected the tumor from the lateral wall of the left cavernous sinus, and removed fairly large pieces in the process. We used the cautery to coagulate remaining tumor tissue along the sinus wall. Once the resection was complete, we inspected the cavity for remaining tissue. We then looked superiorly and removed more of the capsule down to the skull base and cavernous sinus. We irrigated and inspected again. I was satisfied with the resection. We then removed the cottonoids and took care of some bleeding points on the brain surface. Once completed I lined the skull and sinus wall with surgicel. We then started the closure.  I approximated the dura with neurolon sutures. The bone flap was approximated with plates and screws. I approximated the temporalis muscle with vicryls. The galea with vicryl sutures, and the scalp edges with staples.  I applied a sterile dressing. I removed the [place] head holder. She was then extubated and following all commands.   PLAN OF CARE: Admit to inpatient   PATIENT DISPOSITION:  PACU - hemodynamically stable.   Delay start of Pharmacological VTE agent (>24hrs) due to surgical blood loss or risk of bleeding:  yes

## 2017-06-25 NOTE — Anesthesia Postprocedure Evaluation (Signed)
Anesthesia Post Note  Patient: [place]  Procedure(s) Performed: CRANIOTOMY TEMPORAL LEFT FOR TUMOR RESECTION (Left Head)     Patient location during evaluation: PACU Anesthesia Type: General Level of consciousness: awake and alert Pain management: pain level controlled Vital Signs Assessment: post-procedure vital signs reviewed and stable Respiratory status: spontaneous breathing, nonlabored ventilation, respiratory function stable and patient connected to nasal cannula oxygen Cardiovascular status: blood pressure returned to baseline and stable Postop Assessment: no apparent nausea or vomiting Anesthetic complications: no    Last Vitals:  Vitals:   06/25/17 1735 06/25/17 1743  BP:    Pulse: 73   Resp: 18 13  Temp:    SpO2: 100% 100%    Last Pain:  Vitals:   06/25/17 1730  TempSrc:   PainSc: Asleep    LLE Motor Response: Purposeful movement;Responds to commands (06/25/17 1745) LLE Sensation: Full sensation (06/25/17 1745) RLE Motor Response: Purposeful movement;Responds to commands (06/25/17 1745) RLE Sensation: Full sensation (06/25/17 1745)      [place]

## 2017-06-25 NOTE — Anesthesia Procedure Notes (Signed)
Procedure Name: Intubation Date/Time: 06/25/2017 1:18 PM Performed by: Mariea Clonts, CRNA Pre-anesthesia Checklist: Patient identified, Emergency Drugs available, Suction available and Patient being monitored Patient Re-evaluated:Patient Re-evaluated prior to induction Oxygen Delivery Method: Circle System Utilized Preoxygenation: Pre-oxygenation with 100% oxygen Induction Type: IV induction Ventilation: Mask ventilation without difficulty Laryngoscope Size: Miller and 2 Grade View: Grade I Tube type: Oral Tube size: 7.0 mm Number of attempts: 1 Airway Equipment and Method: Stylet and Oral airway Placement Confirmation: ETT inserted through vocal cords under direct vision,  positive ETCO2 and breath sounds checked- equal and bilateral Tube secured with: Tape Dental Injury: Teeth and Oropharynx as per pre-operative assessment

## 2017-06-25 NOTE — H&P (Signed)
BP (!) 173/101   Pulse 84   Temp 98.3 F (36.8 C) (Oral)   Resp 20   SpO2 100%  Tina Parker comes in today, and she is the mother of Tina Parker in whom I placed a shunt in for a tumor. Tina Parker was having a problem with her hearing on the right side and went to see Dr. Benjamine Mola. Secondary to results on the hearing test, Dr. 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 She is sent to me for further evaluation. She is asymptomatic from this. This clearly has nothing to do with the hearing problem. She does have headaches. She has never noticed any problems.  Tina Parker if 5 feet 1 inch, weighs 168 pounds. Temperature is 98.5, blood pressure is 149/90, pulse is 82, and she has 1/10 pain. PAST SURGICAL/MEDICAL HISTORY: She has undergone a cesarean section surgery for a deviated septum. She has had trigger finger surgery and carpal tunnel surgery. She is a Emergency planning/management officer. She does have a history of hypertension. SOCIAL HISTORY: She drinks alcohol socially. She does not smoke. She is 46 years of age. MEDICATIONS: She takes Xyzal for allergies Singulair, Amlodipine, and Losartan. Medication for rheumatoid arthritis, Methotrexate and Dexard. FAMILY HISTORY: Mother and father are both deceased.  Medical problems in the family include hypertension, cancer and diabetes.  She has had 6 months of problems with the ear and some headaches. REVIEW OF SYSTEMS: Positive for hearing loss, sinus problems, arthritis, joint pain and thyroid disease. EXAM: She is alert, oriented x 4, answering all questions appropriately. She has no drift, 5/5 strength in the upper and lower extremities. 2+ reflexes, biceps, triceps, brachioradialis, knees, and ankles. Normal gait. Negative Romberg. Easily tandem walks. She has full visual fields. Muscle tone, bulk, coordination are normal.   MRI shows what appears to be a uniformly enhancing mass in the left temporal tip along the greater sphenoid wing consistent with a meningioma. I am in agreement with the radiologist. DIAGNOSIS: I believe it is best resected though and it could be radiated. It is not greater than 3 cm in volume, 9 cm in volume, then 9 cm cubed volume. 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 I would expect hospitalization of 2-3 days. Whenever I do a craniotomy, I have to explain the risks of stroke and death. This is anterior to the motor area. I do not think that there would be any paralysis. I do not think she would have a change in personality. We are near the speech area, but by no means are we intimate to the speech area. There is no such thing as an easy operation, but I would hope, given its appearance on the MRI, that it would be a straightforward approach and resection. With that understanding, she would like to proceed.

## 2017-06-25 NOTE — Anesthesia Procedure Notes (Signed)
Arterial Line Insertion Start/End12/27/2018 11:30 AM, 06/25/2017 11:35 AM Performed by: CRNA  Patient location: OOR procedure area. Preanesthetic checklist: patient identified, IV checked, site marked, risks and benefits discussed, surgical consent, monitors and equipment checked, pre-op evaluation, timeout performed and anesthesia consent Lidocaine 1% used for infiltration Right, radial was placed Catheter size: 20 G Hand hygiene performed  and maximum sterile barriers used   Attempts: 1 Procedure performed without using ultrasound guided technique. Following insertion, dressing applied and Biopatch. Post procedure assessment: normal  Patient tolerated the procedure well with no immediate complications.

## 2017-06-25 NOTE — Transfer of Care (Signed)
Immediate Anesthesia Transfer of Care Note  Patient: Tina Parker  Procedure(s) Performed: CRANIOTOMY TEMPORAL LEFT FOR TUMOR RESECTION (Left Head)  Patient Location: PACU  Anesthesia Type:General  Level of Consciousness: awake, alert  and oriented  Airway & Oxygen Therapy: Patient Spontanous Breathing and Patient connected to nasal cannula oxygen  Post-op Assessment: Report given to RN, Post -op Vital signs reviewed and stable and Patient moving all extremities X 4  Post vital signs: Reviewed and stable  Last Vitals:  Vitals:   06/25/17 1046 06/25/17 1645  BP: (!) 173/101   Pulse: 84   Resp: 20   Temp: 36.8 C 37 C  SpO2: 100%     Last Pain:  Vitals:   06/25/17 1046  TempSrc: Oral         Complications: No apparent anesthesia complications

## 2017-06-26 ENCOUNTER — Encounter (HOSPITAL_COMMUNITY): Payer: Self-pay | Admitting: Neurosurgery

## 2017-06-26 NOTE — Progress Notes (Signed)
Patient ID: Tina Parker, female   DOB: 07-08-1970, 46 y.o.   MRN: [id number] BP 131/79 (BP Location: Left Arm)   Pulse (!) 104   Temp 99.8 F (37.7 C) (Oral)   Resp (!) 25   Ht 5\' 1"  (1.549 m)   Wt 77.1 kg (169 lb 15.6 oz)   SpO2 95%   BMI 32.12 kg/m  Alert and oriented x 4, speech is clear and fluent Perrl, full eom Jaw pain on the left side Still waiting on the MRI Doing well overall Wound dressing is clean, and dry.

## 2017-06-27 ENCOUNTER — Inpatient Hospital Stay (HOSPITAL_COMMUNITY): Payer: 59

## 2017-06-27 MED ORDER — GADOBENATE DIMEGLUMINE 529 MG/ML IV SOLN
15.0000 mL | Freq: Once | INTRAVENOUS | Status: AC | PRN
  Administered 2017-06-27: 15 mL via INTRAVENOUS

## 2017-06-27 NOTE — Progress Notes (Signed)
Pt seen and examined. No issues overnight.   EXAM: Temp:  [98.5 F (36.9 C)-99.8 F (37.7 C)] 98.8 F (37.1 C) (12/29 0800) Pulse Rate:  [61-110] 76 (12/29 0800) Resp:  [11-37] 17 (12/29 0800) BP: (124-153)/(74-93) 144/80 (12/29 0800) SpO2:  [93 %-97 %] 95 % (12/29 0800) Arterial Line BP: (94-164)/(74-98) 102/90 (12/29 0800) Intake/Output      12/28 0701 - 12/29 0700 12/29 0701 - 12/30 0700   P.O. 500    I.V. (mL/kg) 2000 (25.9) 80 (1)   IV Piggyback 210    Total Intake(mL/kg) 2710 (35.1) 80 (1)   Urine (mL/kg/hr) 3250 (1.8)    Blood     Total Output 3250    Net -540 +80         Awake, alert, oriented Moving arms and legs well Bandage c/d/i  LABS: Lab Results  Component Value Date   CREATININE 0.83 06/22/2017   BUN 6 06/22/2017   NA 139 06/22/2017   K 4.0 06/22/2017   CL 106 06/22/2017   CO2 25 06/22/2017   Lab Results  Component Value Date   WBC 6.5 06/22/2017   HGB 12.6 06/22/2017   HCT 37.9 06/22/2017   MCV 92.7 06/22/2017   PLT 322 06/22/2017    IMAGING: MRI independently reviewed.  Shows good resection, no residual.  Some blood products in resection cavity.  IMPRESSION: - 46 y.o. female s/p meningioma resection. Stable  PLAN: - TTF - Ambulate

## 2017-06-28 MED ORDER — LEVETIRACETAM 500 MG PO TABS
500.0000 mg | ORAL_TABLET | Freq: Two times a day (BID) | ORAL | Status: DC
  Administered 2017-06-28 – 2017-06-29 (×2): 500 mg via ORAL
  Filled 2017-06-28 (×2): qty 1

## 2017-06-28 NOTE — Progress Notes (Signed)
Patient ID: Tina Parker, female   DOB: 1971/01/23, 46 y.o.   MRN: [id number] Subjective: The patient is alert and pleasant.  Family members are at the bedside.  She is in no apparent distress.  Objective: Vital signs in last 24 hours: Temp:  [98.2 F (36.8 C)-99.2 F (37.3 C)] 98.6 F (37 C) (12/30 0800) Pulse Rate:  [54-103] 61 (12/30 0800) Resp:  [11-22] 13 (12/30 0800) BP: (128-157)/(72-93) 145/72 (12/30 0800) SpO2:  [94 %-97 %] 96 % (12/30 0800)  Intake/Output from previous day: 12/29 0701 - 12/30 0700 In: 3230 [P.O.:1470; I.V.:1760] Out: 500 [Urine:500] Intake/Output this shift: Total I/O In: 240 [I.V.:240] Out: -   Physical exam the patient is alert and oriented.  She is moving all 4 extremities well.  Her craniotomy incision is healing well.  Lab Results: No results for input(s): WBC, HGB, HCT, PLT in the last 72 hours. BMET No results for input(s): NA, K, CL, CO2, GLUCOSE, BUN, CREATININE, CALCIUM in the last 72 hours.  Studies/Results: Mr Tina Parker EQ Contrast  Result Date: 06/27/2017 CLINICAL DATA:  46 y/o F; status post craniotomy for resection of meningioma 06/25/2017. EXAM: MRI HEAD WITHOUT AND WITH CONTRAST TECHNIQUE: Multiplanar, multiecho pulse sequences of the brain and surrounding structures were obtained without and with intravenous contrast. CONTRAST:  37mL MULTIHANCE GADOBENATE DIMEGLUMINE 529 MG/ML IV SOLN COMPARISON:  04/20/2017 MRI head. FINDINGS: Brain: Left frontotemporal craniotomy and left anterior temporal region resection cavity containing blood products. There is linear marginal enhancement of the resection cavity, left hemisphere mild smooth dural thickening, small volume pneumocephalus, and small extra-axial collection subjacent to the craniotomy compatible postsurgical changes. No residual nodular enhancement is identified to suggest residual disease. 5 mm left-to-right midline shift. No evidence for infarct or brain parenchymal hemorrhage. No  abnormal enhancement of brain parenchyma. Vascular: Normal flow voids. Skull and upper cervical spine: Normal marrow signal. Sinuses/Orbits: Negative. Other: None. IMPRESSION: 1. Left frontotemporal craniotomy and left anterior temporal region resection cavity containing blood products. 2. No residual meningioma identified. 3. Postsurgical changes including smooth left convexity dural thickening, small volume pneumocephalus, extra-axial collection subjacent to craniotomy, and mass effect with 5 mm left-to-right midline shift. 4. No acute infarct or hemorrhage of the brain. No new abnormal enhancement. Electronically Signed   By: Kristine Garbe M.D.   On: 06/27/2017 02:20    Assessment/Plan: Postop day #3: The patient is doing well.  I will transfer her to the floor.  LOS: 3 days     [name] 06/28/2017, 10:26 AM

## 2017-06-29 MED ORDER — OXYCODONE HCL 5 MG PO TABS: 5.0000 mg | ORAL_TABLET | Freq: Four times a day (QID) | ORAL | 0 refills | Status: AC | PRN

## 2017-06-29 MED ORDER — LEVETIRACETAM 500 MG PO TABS: 500.0000 mg | ORAL_TABLET | Freq: Two times a day (BID) | ORAL | 0 refills | Status: DC

## 2017-06-29 MED ORDER — DEXAMETHASONE 4 MG PO TABS: ORAL_TABLET | ORAL | 0 refills | Status: DC

## 2017-06-29 NOTE — Discharge Instructions (Signed)
Craniotomy °Care After °Please read the instructions outlined below and refer to this sheet in the next few weeks. These discharge instructions provide you with general information on caring for yourself after you leave the hospital. Your surgeon may also give you specific instructions. While your treatment has been planned according to the most current medical practices available, unavoidable complications occasionally occur. If you have any problems or questions after discharge, please call your surgeon. °Although there are many types of brain surgery, recovery following craniotomy (surgical opening of the skull) is much the same for each. However, recovery depends on many factors. These include the type and severity of brain injury and the type of surgery. It also depends on any nervous system function problems (neurological deficits) before surgery. If the craniotomy was done for cancer, chemotherapy and radiation could follow. You could be in the hospital from 5 days to a couple weeks. This depends on the type of surgery, findings, and whether there are complications. °HOME CARE INSTRUCTIONS  °· It is not unusual to hear a clicking noise after a craniotomy, the plates and screws used to attach the bone flap can sometimes cause this. It is a normal occurrence if this does happen °· Do not drive for 10 days after the operation °· Your scalp may feel spongy for a while, because of fluid under it. This will gradually get better. Occasionally, the surgeon will not replace the bone that was removed to access the brain. If there is a bony defect, the surgeon will ask you to wear a helmet for protection. This is a discussion you should have with your surgeon prior to leaving the hospital (discharge). °· Numbness may persist in some areas of your scalp. °· Take all medications as directed. Sometimes steroids to control swelling are prescribed. Anticonvulsants to prevent seizures may also be given. Do not use alcohol,  other drugs, or medications unless your surgeon says it is OK. °· Keep the wound dry and clean. The wound may be washed gently with soap and water. Then, you may gently blot or dab it dry, without rubbing. Do not take baths, use swimming pools or hot tubs for 10 days, or as instructed by your caregiver. It is best to wait to see you surgeon at your first postoperative visit, and to get directions at that time. °· Only take over-the-counter or prescription medicines for pain, discomfort, or fever as directed by your caregiver. °· You may continue your normal diet, as directed. °· Walking is OK for exercise. Wait at least 3 months before you return to mild, non-contact sports or as your surgeon suggests. Contact sports should be avoided for at least 1 year, unless your surgeon says it is OK. °· If you are prescribed steroids, take them exactly as prescribed. If you start having a decrease in nervous system functions (neurological deficits) and headaches as the dose of steroids is reduced, tell your surgeon right away. °· When the anticonvulsant prescription is finished you no longer need to take it. °SEEK IMMEDIATE MEDICAL CARE IF:  °· You develop nausea, vomiting, severe headaches, confusion, or you have a seizure. °· You develop chest pain, a stiff neck, or difficulty breathing. °· There is redness, swelling, or increasing pain in the wound or pin insertion sites. °· You have an increase in swelling or bruising around the eyes. °· There is drainage or pus coming from the wound. °· You have an oral temperature above 102° F (38.9° C), not controlled by medicine. °·   You notice a foul smell coming from the wound or dressing. °· The wound breaks open (edges not staying together) after the stitches have been removed. °· You develop dizziness or fainting while standing. °· You develop a rash. °· You develop any reaction or side effects to the medications given. °Document Released: 09/16/2005 Document Revised: 09/08/2011  Document Reviewed: 06/25/2009 °Exit[employer]® Patient Information ©2013 [employer]. ° °

## 2017-06-29 NOTE — Progress Notes (Signed)
Pt d/c home per MD order, pt verbalized understanding of d/c. D/c instructions given all questions answered

## 2017-06-29 NOTE — Discharge Summary (Signed)
Physician Discharge Summary  Patient ID: ADDISEN CHAPPELLE MRN: [id number] DOB/AGE: [birth year] 46 y.o.  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 Post op her wound is clean, dry, and without signs of infection. She is ambulating, voiding, and tolerating a regular diet. I will discharge her today.   Treatments: surgery: CRANIOTOMY TEMPORAL LEFT FOR TUMOR RESECTION Microdissection    Discharge Exam: Blood pressure (!) 154/90, pulse 60, temperature 98.1 F (36.7 C), temperature source Oral, resp. rate 18, height 5\' 1"  (1.549 m), weight 77.1 kg (169 lb 15.6 oz), SpO2 99 %. General appearance: alert, cooperative, appears stated age and no distress Neurologic: Alert and oriented X 3, normal strength and tone. Normal symmetric reflexes. Normal coordination and gait  Disposition: Final discharge disposition not confirmed MENINGIOMA  Allergies as of 06/29/2017      Reactions   Inh [isoniazid] Other (See Comments)   Causes hepatitis      Medication List    TAKE these medications   albuterol 108 (90 Base) MCG/ACT inhaler Commonly known as:  PROVENTIL HFA;VENTOLIN HFA Inhale 2 puffs into the lungs every 6 (six) hours as needed for wheezing or shortness of breath.   amLODipine 5 MG tablet Commonly known as:  NORVASC take 1 tablet by mouth once daily   dexamethasone 4 MG tablet Commonly known as:  DECADRON Take two tablets for two days, then 1 tablet for two days then stop.   DEXILANT 30 MG capsule Generic drug:  Dexlansoprazole Take 30 mg by mouth daily.   ibuprofen 800 MG tablet Commonly known as:  ADVIL,MOTRIN Take 800 mg by  mouth 3 (three) times daily as needed. TID PRN for pain   levETIRAcetam 500 MG tablet Commonly known as:  KEPPRA Take 1 tablet (500 mg total) by mouth 2 (two) times daily.   levocetirizine 5 MG tablet Commonly known as:  XYZAL Take 5 mg by mouth at bedtime.   losartan 50 MG tablet Commonly known as:  COZAAR take 1 tablet by mouth once daily   medroxyPROGESTERone 150 MG/ML injection Commonly known as:  DEPO-PROVERA Inject 150 mg into the muscle every 3 (three) months.   methotrexate 2.5 MG tablet Commonly known as:  RHEUMATREX Take 22.5 mg by mouth every Monday. Caution:Chemotherapy. Protect from light. Take 9 tabs po weekly with meals   montelukast 10 MG tablet Commonly known as:  SINGULAIR Take 1 tablet (10 mg total) by mouth at bedtime.   ORENCIA 125 MG/ML Sosy Generic drug:  Abatacept Inject 125 mg into the muscle every Friday.   oxyCODONE 5 MG immediate release tablet Commonly known as:  ROXICODONE Take 1 tablet (5 mg total) by mouth every 6 (six) hours as needed for up to 7 days for moderate pain.   ranitidine 150 MG tablet Commonly known as:  ZANTAC Take 150 mg by mouth 2 (two) times daily as needed (for breakthrough acid reflux/indigestion.).      Follow-up Information    Ashok Pall, MD Follow up in 1 week(s).   Specialty:  Neurosurgery Why:  for suture removal, please call the office to make an appointment Contact information: [address] [place] [address] [phone]  Signed: Hazeline Charnley L 06/29/2017, 12:28 PM

## 2017-06-29 NOTE — Progress Notes (Signed)
Patient refusing to wear cardiac monitor. Will monitor vital signs q4h as patient has med surg orders at this time.

## 2017-07-11 DIAGNOSIS — N949 Unspecified condition associated with female genital organs and menstrual cycle: Secondary | ICD-10-CM | POA: Diagnosis not present

## 2017-07-20 ENCOUNTER — Encounter: Payer: Self-pay | Admitting: Internal Medicine

## 2017-07-20 ENCOUNTER — Inpatient Hospital Stay: Admission: RE | Admit: 2017-07-20 | Payer: 59 | Source: Ambulatory Visit

## 2017-07-20 ENCOUNTER — Ambulatory Visit: Payer: 59 | Admitting: Internal Medicine

## 2017-07-20 VITALS — BP 126/80 | HR 87 | Temp 98.6°F | Resp 20 | Wt 172.2 lb

## 2017-07-20 DIAGNOSIS — E559 Vitamin D deficiency, unspecified: Secondary | ICD-10-CM | POA: Diagnosis not present

## 2017-07-20 DIAGNOSIS — E7800 Pure hypercholesterolemia, unspecified: Secondary | ICD-10-CM

## 2017-07-20 DIAGNOSIS — D329 Benign neoplasm of meninges, unspecified: Secondary | ICD-10-CM | POA: Diagnosis not present

## 2017-07-20 DIAGNOSIS — I10 Essential (primary) hypertension: Secondary | ICD-10-CM | POA: Diagnosis not present

## 2017-07-20 DIAGNOSIS — M059 Rheumatoid arthritis with rheumatoid factor, unspecified: Secondary | ICD-10-CM

## 2017-07-20 DIAGNOSIS — Z3042 Encounter for surveillance of injectable contraceptive: Secondary | ICD-10-CM | POA: Diagnosis not present

## 2017-07-20 DIAGNOSIS — M25551 Pain in right hip: Secondary | ICD-10-CM

## 2017-07-20 DIAGNOSIS — Z6832 Body mass index (BMI) 32.0-32.9, adult: Secondary | ICD-10-CM

## 2017-07-20 DIAGNOSIS — R1030 Lower abdominal pain, unspecified: Secondary | ICD-10-CM

## 2017-07-20 DIAGNOSIS — M25552 Pain in left hip: Secondary | ICD-10-CM

## 2017-07-20 DIAGNOSIS — N898 Other specified noninflammatory disorders of vagina: Secondary | ICD-10-CM

## 2017-07-20 DIAGNOSIS — K219 Gastro-esophageal reflux disease without esophagitis: Secondary | ICD-10-CM

## 2017-07-20 DIAGNOSIS — Z9109 Other allergy status, other than to drugs and biological substances: Secondary | ICD-10-CM | POA: Diagnosis not present

## 2017-07-20 DIAGNOSIS — B009 Herpesviral infection, unspecified: Secondary | ICD-10-CM

## 2017-07-20 LAB — POCT URINE PREGNANCY: Preg Test, Ur: NEGATIVE

## 2017-07-20 MED ORDER — VALACYCLOVIR HCL 1 G PO TABS
1000.0000 mg | ORAL_TABLET | Freq: Two times a day (BID) | ORAL | 1 refills | Status: DC
Start: 2017-07-20 — End: 2019-04-25

## 2017-07-20 MED ORDER — MEDROXYPROGESTERONE ACETATE 150 MG/ML IM SUSP
150.0000 mg | Freq: Once | INTRAMUSCULAR | Status: AC
  Administered 2017-07-20: 150 mg via INTRAMUSCULAR

## 2017-07-20 NOTE — Progress Notes (Signed)
Patient ID: Tina Parker, female   DOB: 06/12/1971, 47 y.o.   MRN: [id number]   Subjective:    Patient ID: Tina Parker, female    DOB: 08/09/1970, 47 y.o.   MRN: [id number]  HPI  Patient here for a scheduled follow up.  She is s/p recent craniotomy for tumor resection - meningioma.  Being followed by Dr Christella Noa.  Doing well.  Started back driving last week.  Taking ibuprofen.  Just tender now.  Healing well.  Has not been able to exercise.  Has been having increased pain right hip.  Planning for f/u and MRI.  Seeing Dr Erlinda Hong.  No chest pain.  No sob.  No acid reflux.  No abdominal pain.  Bowels are moving.  She has been diagnosed with genital herpes.  Had a lot of questions regarding this diagnosis.  On valtrex.  Finishing.  Still with lesions.  Is better.  Starting to sleep better.     Past Medical History:  Diagnosis Date  . Allergic rhinitis   . Anemia   . Arthritis    RA  . Carpal tunnel syndrome   . Dysphagia   . GERD (gastroesophageal reflux disease)   . Graves disease    remission, no ablation, positive medical treatment  . History of hiatal hernia   . Hypertension   . Migraine headache   . PPD positive    hepatitis secondary to [place]  . Pure hypercholesterolemia   . Rheumatoid arthritis(714.0)    positive anti CCP antibodies, positive RF, oligo-articular, MTX   Past Surgical History:  Procedure Laterality Date  . CARPAL TUNNEL RELEASE Right   . CESAREAN SECTION  1996  . CRANIOTOMY Left 06/25/2017   Procedure: CRANIOTOMY TEMPORAL LEFT FOR TUMOR RESECTION;  Surgeon: Ashok Pall, MD;  Location: [facility];  Service: Neurosurgery;  Laterality: Left;  . Post Septoplasty and turbinate reduction  2007  . TRIGGER FINGER RELEASE     Family History  Problem Relation Age of Onset  . Cancer Mother        Breast Cancer  . Breast cancer Mother 70  . Cancer Father        Colon Cancer  . Heart disease Father        Hx of MI  . Hypertension Father   . Diabetes Father   .  Cancer Maternal Grandmother        lung cancer  . Cancer Maternal Uncle        esophageal   Social History   Socioeconomic History  . Marital status: Married    Spouse name: None  . Number of children: None  . Years of education: None  . Highest education level: None  Social Needs  . Financial resource strain: None  . Food insecurity - worry: None  . Food insecurity - inability: None  . Transportation needs - medical: None  . Transportation needs - non-medical: None  Occupational History  . None  Tobacco Use  . Smoking status: Never Smoker  . Smokeless tobacco: Never Used  Substance and Sexual Activity  . Alcohol use: Yes    Alcohol/week: 0.0 oz    Comment: occasionally  . Drug use: No  . Sexual activity: None  Other Topics Concern  . None  Social History Narrative  . None    Outpatient Encounter Medications as of 07/20/2017  Medication Sig  . albuterol (PROVENTIL HFA;VENTOLIN HFA) 108 (90 BASE) MCG/ACT inhaler Inhale 2 puffs into the lungs every 6 (six)  hours as needed for wheezing or shortness of breath.  Marland Kitchen amLODipine (NORVASC) 5 MG tablet take 1 tablet by mouth once daily  . dexamethasone (DECADRON) 4 MG tablet Take two tablets for two days, then 1 tablet for two days then stop.  Marland Kitchen Dexlansoprazole (DEXILANT) 30 MG capsule Take 30 mg by mouth daily.  Marland Kitchen ibuprofen (ADVIL,MOTRIN) 800 MG tablet Take 800 mg by mouth 3 (three) times daily as needed. TID PRN for pain  . levETIRAcetam (KEPPRA) 500 MG tablet Take 1 tablet (500 mg total) by mouth 2 (two) times daily.  Marland Kitchen levocetirizine (XYZAL) 5 MG tablet Take 5 mg by mouth at bedtime.  Marland Kitchen losartan (COZAAR) 50 MG tablet take 1 tablet by mouth once daily  . medroxyPROGESTERone (DEPO-PROVERA) 150 MG/ML injection Inject 150 mg into the muscle every 3 (three) months.   . methotrexate (RHEUMATREX) 2.5 MG tablet Take 22.5 mg by mouth every Monday. Caution:Chemotherapy. Protect from light. Take 9 tabs po weekly with meals  . montelukast  (SINGULAIR) 10 MG tablet Take 1 tablet (10 mg total) by mouth at bedtime.  Marland Kitchen ORENCIA 125 MG/ML SOSY Inject 125 mg into the muscle every Friday.   . ranitidine (ZANTAC) 150 MG tablet Take 150 mg by mouth 2 (two) times daily as needed (for breakthrough acid reflux/indigestion.).   Marland Kitchen valACYclovir (VALTREX) 1000 MG tablet Take 1 tablet (1,000 mg total) by mouth 2 (two) times daily.  . [EXPIRED] medroxyPROGESTERone (DEPO-PROVERA) injection 150 mg    No facility-administered encounter medications on file as of 07/20/2017.     Review of Systems  Constitutional: Negative for appetite change.       Has gained some weight.  Not been able to exercise.    HENT: Negative for congestion and sinus pressure.   Respiratory: Negative for cough, chest tightness and shortness of breath.   Cardiovascular: Negative for chest pain, palpitations and leg swelling.  Gastrointestinal: Negative for abdominal pain, diarrhea, nausea and vomiting.  Genitourinary: Negative for difficulty urinating.       Vaginal pain and lesions as outlined.  Improving.  Some dysuria.  Wants urine checked.    Musculoskeletal: Negative for myalgias.       Right hip pain as outlined.    Skin: Negative for color change and rash.  Neurological: Negative for dizziness, light-headedness and headaches.  Psychiatric/Behavioral: Negative for agitation and dysphoric mood.       Objective:    Physical Exam  Constitutional: She appears well-developed and well-nourished. No distress.  HENT:  Nose: Nose normal.  Mouth/Throat: Oropharynx is clear and moist.  Incision site healing well.    Neck: Neck supple. No thyromegaly present.  Cardiovascular: Normal rate and regular rhythm.  Pulmonary/Chest: Breath sounds normal. No respiratory distress. She has no wheezes.  Abdominal: Soft. Bowel sounds are normal. There is no tenderness.  Genitourinary:  Genitourinary Comments: Lesions noted - external genitalia.  Appears to be c/w herpetic lesions.   Lesions also noted - peri rectal area.      Musculoskeletal: She exhibits no edema or tenderness.  Lymphadenopathy:    She has no cervical adenopathy.  Skin: Skin is warm. No erythema.  Psychiatric: She has a normal mood and affect. Her behavior is normal.    BP 126/80 (BP Location: Left Arm, Patient Position: Sitting, Cuff Size: Large)   Pulse 87   Temp 98.6 F (37 C) (Oral)   Resp 20   Wt 172 lb 3.2 oz (78.1 kg)   SpO2 98%   BMI  32.54 kg/m  Wt Readings from Last 3 Encounters:  07/20/17 172 lb 3.2 oz (78.1 kg)  06/25/17 169 lb 15.6 oz (77.1 kg)  06/22/17 167 lb 1.6 oz (75.8 kg)     Lab Results  Component Value Date   WBC 6.5 06/22/2017   HGB 12.6 06/22/2017   HCT 37.9 06/22/2017   PLT 322 06/22/2017   GLUCOSE 91 06/22/2017   CHOL 187 01/19/2017   TRIG 57.0 01/19/2017   HDL 51.70 01/19/2017   LDLCALC 124 (H) 01/19/2017   ALT 14 01/19/2017   AST 17 01/19/2017   NA 139 06/22/2017   K 4.0 06/22/2017   CL 106 06/22/2017   CREATININE 0.83 06/22/2017   BUN 6 06/22/2017   CO2 25 06/22/2017   TSH 2.48 01/19/2017       Assessment & Plan:   Problem List Items Addressed This Visit    BMI 32.0-32.9,adult    Has been unable to exercise.  Discussed diet.  Follow.        Environmental allergies    Stable on current regimen.        Essential hypertension, benign    Blood pressure under good control.  Continue same medication regimen.  Follow pressures.  Follow metabolic panel.        GERD (gastroesophageal reflux disease)    Controlled on current regimen.        Herpes    Recently diagnosed.  On valtrex.  Has improved. Still with lesion as outlined.  Questions answered.  Assume primary genital herpes.  Discussed shingles as well.  Culture obtained.  Continue valtrex.        Relevant Medications   valACYclovir (VALTREX) 1000 MG tablet   Hypercholesterolemia    Low cholesterol diet and exercise.  Follow lipid panel.        Meningioma of left sphenoid wing  involving cavernous sinus (HCC)    Seeing Dr Christella Noa.  S/p surgery.  Doing well.  Started back driving.        Pain of both hip joints    Has pain as outlined. Right hip worse.  Seeing ortho.  Planning for MRI.       Rheumatoid arthritis ([place])    Followed by Dr Jefm Bryant.        Vitamin D deficiency    Follow vitamin D level.        Other Visit Diagnoses    Lower abdominal pain    -  Primary   Relevant Orders   Urinalysis, Routine w reflex microscopic (Completed)   Urine Culture (Completed)   Depo-Provera contraceptive status       Relevant Medications   medroxyPROGESTERone (DEPO-PROVERA) injection 150 mg (Completed)   Other Relevant Orders   POCT urine pregnancy (Completed)   Vaginal lesion       Relevant Orders   Hsv Culture And Typing       Einar Pheasant, MD

## 2017-07-21 ENCOUNTER — Ambulatory Visit (INDEPENDENT_AMBULATORY_CARE_PROVIDER_SITE_OTHER): Payer: 59 | Admitting: Orthopaedic Surgery

## 2017-07-21 LAB — URINALYSIS, ROUTINE W REFLEX MICROSCOPIC
Bilirubin Urine: NEGATIVE
Hgb urine dipstick: NEGATIVE
Ketones, ur: NEGATIVE
Leukocytes, UA: NEGATIVE
Nitrite: NEGATIVE
RBC / HPF: NONE SEEN (ref 0–?)
Specific Gravity, Urine: 1.015 (ref 1.000–1.030)
Total Protein, Urine: NEGATIVE
Urine Glucose: NEGATIVE
Urobilinogen, UA: 0.2 (ref 0.0–1.0)
pH: 7 (ref 5.0–8.0)

## 2017-07-21 LAB — URINE CULTURE
MICRO NUMBER:: 90085598
Result:: NO GROWTH
SPECIMEN QUALITY:: ADEQUATE

## 2017-07-22 ENCOUNTER — Encounter: Payer: Self-pay | Admitting: Internal Medicine

## 2017-07-23 ENCOUNTER — Encounter: Payer: Self-pay | Admitting: Internal Medicine

## 2017-07-23 DIAGNOSIS — B009 Herpesviral infection, unspecified: Secondary | ICD-10-CM | POA: Insufficient documentation

## 2017-07-23 LAB — HSV CULTURE AND TYPING

## 2017-07-23 NOTE — Assessment & Plan Note (Signed)
Low cholesterol diet and exercise.  Follow lipid panel.   

## 2017-07-23 NOTE — Assessment & Plan Note (Signed)
Blood pressure under good control.  Continue same medication regimen.  Follow pressures.  Follow metabolic panel.   

## 2017-07-23 NOTE — Assessment & Plan Note (Signed)
Recently diagnosed.  On valtrex.  Has improved. Still with lesion as outlined.  Questions answered.  Assume primary genital herpes.  Discussed shingles as well.  Culture obtained.  Continue valtrex.

## 2017-07-23 NOTE — Assessment & Plan Note (Signed)
Controlled on current regimen.   

## 2017-07-23 NOTE — Assessment & Plan Note (Signed)
Seeing Dr Christella Noa.  S/p surgery.  Doing well.  Started back driving.

## 2017-07-23 NOTE — Assessment & Plan Note (Signed)
Has pain as outlined. Right hip worse.  Seeing ortho.  Planning for MRI.

## 2017-07-23 NOTE — Assessment & Plan Note (Signed)
Stable on current regimen   

## 2017-07-23 NOTE — Assessment & Plan Note (Signed)
Has been unable to exercise.  Discussed diet.  Follow.

## 2017-07-23 NOTE — Assessment & Plan Note (Signed)
Followed by Dr Kernodle.   

## 2017-07-23 NOTE — Assessment & Plan Note (Signed)
Follow vitamin D level.  

## 2017-07-24 ENCOUNTER — Encounter: Payer: Self-pay | Admitting: Internal Medicine

## 2017-08-14 ENCOUNTER — Ambulatory Visit
Admission: RE | Admit: 2017-08-14 | Discharge: 2017-08-14 | Disposition: A | Discharge status: Home / Self Care | Payer: 59 | Source: Ambulatory Visit | Attending: Orthopaedic Surgery | Admitting: Orthopaedic Surgery

## 2017-08-14 DIAGNOSIS — M25551 Pain in right hip: Secondary | ICD-10-CM | POA: Diagnosis not present

## 2017-08-14 MED ORDER — IOPAMIDOL (ISOVUE-M 200) INJECTION 41%
12.0000 mL | Freq: Once | INTRAMUSCULAR | Status: AC
  Administered 2017-08-14: 12 mL via INTRA_ARTICULAR

## 2017-08-16 ENCOUNTER — Other Ambulatory Visit: Payer: Self-pay | Admitting: Internal Medicine

## 2017-08-17 DIAGNOSIS — M0579 Rheumatoid arthritis with rheumatoid factor of multiple sites without organ or systems involvement: Secondary | ICD-10-CM | POA: Diagnosis not present

## 2017-08-17 DIAGNOSIS — Z79899 Other long term (current) drug therapy: Secondary | ICD-10-CM | POA: Diagnosis not present

## 2017-08-24 ENCOUNTER — Ambulatory Visit (INDEPENDENT_AMBULATORY_CARE_PROVIDER_SITE_OTHER): Payer: 59 | Admitting: Orthopaedic Surgery

## 2017-08-24 ENCOUNTER — Encounter (INDEPENDENT_AMBULATORY_CARE_PROVIDER_SITE_OTHER): Payer: Self-pay | Admitting: Orthopaedic Surgery

## 2017-08-24 DIAGNOSIS — M25551 Pain in right hip: Secondary | ICD-10-CM | POA: Diagnosis not present

## 2017-08-24 DIAGNOSIS — M25511 Pain in right shoulder: Secondary | ICD-10-CM | POA: Diagnosis not present

## 2017-08-24 DIAGNOSIS — M059 Rheumatoid arthritis with rheumatoid factor, unspecified: Secondary | ICD-10-CM

## 2017-08-24 DIAGNOSIS — J301 Allergic rhinitis due to pollen: Secondary | ICD-10-CM | POA: Diagnosis not present

## 2017-08-24 DIAGNOSIS — G8929 Other chronic pain: Secondary | ICD-10-CM | POA: Diagnosis not present

## 2017-08-24 DIAGNOSIS — M0579 Rheumatoid arthritis with rheumatoid factor of multiple sites without organ or systems involvement: Secondary | ICD-10-CM | POA: Diagnosis not present

## 2017-08-24 DIAGNOSIS — R062 Wheezing: Secondary | ICD-10-CM | POA: Diagnosis not present

## 2017-08-24 DIAGNOSIS — M7061 Trochanteric bursitis, right hip: Secondary | ICD-10-CM | POA: Diagnosis not present

## 2017-08-24 DIAGNOSIS — J3089 Other allergic rhinitis: Secondary | ICD-10-CM | POA: Diagnosis not present

## 2017-08-24 NOTE — Addendum Note (Signed)
Addended by: Meyer Cory on: 08/24/2017 08:23 AM   Modules accepted: Orders

## 2017-08-24 NOTE — Progress Notes (Signed)
Office Visit Note   Patient: Tina Parker           Date of Birth: [birth year]           MRN: [id number] Visit Date: 08/24/2017              Requested by: Einar Pheasant, MD [address] [place],  [id number] PCP: Einar Pheasant, MD   Assessment & Plan: Visit Diagnoses:  1. Pain in right hip   2. Rheumatoid arthritis with positive rheumatoid factor, involving unspecified site [facility])     Plan: Impression is right hip pain likely from lumbar spine pathology.  MRI of the right hip was completely normal.  We will order MRI of the lumbar spine at this point to rule out structural abnormality.  I will call the patient with the results once they are available.  Follow-Up Instructions: Return if symptoms worsen or fail to improve.   Orders:  No orders of the defined types were placed in this encounter.  No orders of the defined types were placed in this encounter.     Procedures: No procedures performed   Clinical Data: No additional findings.   Subjective: Chief Complaint  Patient presents with  . Right Hip - Pain, Follow-up    Patient comes in today for right hip MRI review.  Her symptoms are relatively unchanged.  She still complains of right-sided low back pain that radiates into the lateral hip and into the groin.    Review of Systems   Objective: Vital Signs: There were no vitals taken for this visit.  Physical Exam  Ortho Exam Exam is stable Specialty Comments:  No specialty comments available.  Imaging: No results found.   PMFS History: Patient Active Problem List   Diagnosis Date Noted  . Herpes 07/23/2017  . Brain tumor ([place]) 06/25/2017  . 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 ([place]) 06/25/2017  . Pain of both hip joints 05/18/2017  . Vitamin D deficiency 09/21/2016  . Cough 01/07/2015  . Difficulty sleeping 01/07/2015  . Health care maintenance 08/02/2014  . Snoring 03/19/2014  .  Dysphagia 01/15/2014  . BMI 32.0-32.9,adult 12/11/2013  . Skin lesion 12/11/2013  . Family history of breast cancer 10/22/2013  . Environmental allergies 10/22/2013  . Essential hypertension, benign 09/15/2012  . Hypercholesterolemia 09/15/2012  . Rheumatoid arthritis ([place]) 09/15/2012  . GERD (gastroesophageal reflux disease) 09/15/2012  . Family history of colon cancer 09/15/2012  . Anemia 09/15/2012  . Hypothyroidism 09/15/2012   Past Medical History:  Diagnosis Date  . Allergic rhinitis   . Anemia   . Arthritis    RA  . Carpal tunnel syndrome   . Dysphagia   . GERD (gastroesophageal reflux disease)   . Graves disease    remission, no ablation, positive medical treatment  . History of hiatal hernia   . Hypertension   . Migraine headache   . PPD positive    hepatitis secondary to [place]  . Pure hypercholesterolemia   . Rheumatoid arthritis(714.0)    positive anti CCP antibodies, positive RF, oligo-articular, MTX    Family History  Problem Relation Age of Onset  . Cancer Mother        Breast Cancer  . Breast cancer Mother 52  . Cancer Father        Colon Cancer  . Heart disease Father        Hx of MI  . Hypertension Father   . Diabetes Father   .  Cancer Maternal Grandmother        lung cancer  . Cancer Maternal Uncle        esophageal    Past Surgical History:  Procedure Laterality Date  . CARPAL TUNNEL RELEASE Right   . CESAREAN SECTION  1996  . CRANIOTOMY Left 06/25/2017   Procedure: CRANIOTOMY TEMPORAL LEFT FOR TUMOR RESECTION;  Surgeon: Ashok Pall, MD;  Location: [facility];  Service: Neurosurgery;  Laterality: Left;  . Post Septoplasty and turbinate reduction  2007  . TRIGGER FINGER RELEASE     Social History   Occupational History  . Not on file  Tobacco Use  . Smoking status: Never Smoker  . Smokeless tobacco: Never Used  Substance and Sexual Activity  . Alcohol use: Yes    Alcohol/week: 0.0 oz    Comment: occasionally  . Drug use: No  . Sexual  activity: Not on file

## 2017-09-07 ENCOUNTER — Ambulatory Visit: Payer: 59 | Admitting: Internal Medicine

## 2017-09-07 ENCOUNTER — Ambulatory Visit
Admission: RE | Admit: 2017-09-07 | Discharge: 2017-09-07 | Disposition: A | Discharge status: Home / Self Care | Payer: 59 | Source: Ambulatory Visit | Attending: Orthopaedic Surgery | Admitting: Orthopaedic Surgery

## 2017-09-07 DIAGNOSIS — M9905 Segmental and somatic dysfunction of pelvic region: Secondary | ICD-10-CM | POA: Diagnosis not present

## 2017-09-07 DIAGNOSIS — M5416 Radiculopathy, lumbar region: Secondary | ICD-10-CM | POA: Diagnosis not present

## 2017-09-07 DIAGNOSIS — M059 Rheumatoid arthritis with rheumatoid factor, unspecified: Secondary | ICD-10-CM

## 2017-09-07 DIAGNOSIS — Z6832 Body mass index (BMI) 32.0-32.9, adult: Secondary | ICD-10-CM

## 2017-09-07 DIAGNOSIS — E559 Vitamin D deficiency, unspecified: Secondary | ICD-10-CM | POA: Diagnosis not present

## 2017-09-07 DIAGNOSIS — E7800 Pure hypercholesterolemia, unspecified: Secondary | ICD-10-CM | POA: Diagnosis not present

## 2017-09-07 DIAGNOSIS — I10 Essential (primary) hypertension: Secondary | ICD-10-CM

## 2017-09-07 DIAGNOSIS — D329 Benign neoplasm of meninges, unspecified: Secondary | ICD-10-CM | POA: Diagnosis not present

## 2017-09-07 DIAGNOSIS — M9903 Segmental and somatic dysfunction of lumbar region: Secondary | ICD-10-CM | POA: Diagnosis not present

## 2017-09-07 DIAGNOSIS — K219 Gastro-esophageal reflux disease without esophagitis: Secondary | ICD-10-CM | POA: Diagnosis not present

## 2017-09-07 DIAGNOSIS — M48061 Spinal stenosis, lumbar region without neurogenic claudication: Secondary | ICD-10-CM | POA: Diagnosis not present

## 2017-09-07 DIAGNOSIS — M25551 Pain in right hip: Secondary | ICD-10-CM

## 2017-09-07 DIAGNOSIS — Z9109 Other allergy status, other than to drugs and biological substances: Secondary | ICD-10-CM

## 2017-09-07 NOTE — Progress Notes (Signed)
Patient ID: Tina Parker, female   DOB: 10-22-1970, 47 y.o.   MRN: [id number]   Subjective:    Patient ID: Tina Parker, female    DOB: 1970/08/05, 47 y.o.   MRN: [id number]  HPI  Patient here for a scheduled follow up.  She report she is doing relatively well.  Trying to stay active.  We discussed diet and exercise.  No chest pain.  Breathing and allergies stable.  Saw Dr Jefm Bryant for shoulder pain.  S/p injection.  Also with right hip pain.  Following with surgery.  S/p removal of meningioma.  Has f/u with neurosurgery 09/2017.  Doing well.  No acid reflux.  No swallowing issues reported today.  No abdominal pain.  Bowels stable.     Past Medical History:  Diagnosis Date  . Allergic rhinitis   . Anemia   . Arthritis    RA  . Carpal tunnel syndrome   . Dysphagia   . GERD (gastroesophageal reflux disease)   . Graves disease    remission, no ablation, positive medical treatment  . History of hiatal hernia   . Hypertension   . Migraine headache   . PPD positive    hepatitis secondary to [place]  . Pure hypercholesterolemia   . Rheumatoid arthritis(714.0)    positive anti CCP antibodies, positive RF, oligo-articular, MTX   Past Surgical History:  Procedure Laterality Date  . CARPAL TUNNEL RELEASE Right   . CESAREAN SECTION  1996  . CRANIOTOMY Left 06/25/2017   Procedure: CRANIOTOMY TEMPORAL LEFT FOR TUMOR RESECTION;  Surgeon: Ashok Pall, MD;  Location: [facility];  Service: Neurosurgery;  Laterality: Left;  . Post Septoplasty and turbinate reduction  2007  . TRIGGER FINGER RELEASE     Family History  Problem Relation Age of Onset  . Cancer Mother        Breast Cancer  . Breast cancer Mother 42  . Cancer Father        Colon Cancer  . Heart disease Father        Hx of MI  . Hypertension Father   . Diabetes Father   . Cancer Maternal Grandmother        lung cancer  . Cancer Maternal Uncle        esophageal   Social History   Socioeconomic History  . Marital status:  Married    Spouse name: None  . Number of children: None  . Years of education: None  . Highest education level: None  Social Needs  . Financial resource strain: None  . Food insecurity - worry: None  . Food insecurity - inability: None  . Transportation needs - medical: None  . Transportation needs - non-medical: None  Occupational History  . None  Tobacco Use  . Smoking status: Never Smoker  . Smokeless tobacco: Never Used  Substance and Sexual Activity  . Alcohol use: Yes    Alcohol/week: 0.0 oz    Comment: occasionally  . Drug use: No  . Sexual activity: None  Other Topics Concern  . None  Social History Narrative  . None    Outpatient Encounter Medications as of 09/07/2017  Medication Sig  . albuterol (PROVENTIL HFA;VENTOLIN HFA) 108 (90 BASE) MCG/ACT inhaler Inhale 2 puffs into the lungs every 6 (six) hours as needed for wheezing or shortness of breath.  Marland Kitchen amLODipine (NORVASC) 5 MG tablet take 1 tablet by mouth once daily  . dexamethasone (DECADRON) 4 MG tablet Take two tablets for two  days, then 1 tablet for two days then stop. (Patient not taking: Reported on 09/07/2017)  . Dexlansoprazole (DEXILANT) 30 MG capsule Take 30 mg by mouth daily.  Marland Kitchen ibuprofen (ADVIL,MOTRIN) 800 MG tablet Take 800 mg by mouth 3 (three) times daily as needed. TID PRN for pain  . levETIRAcetam (KEPPRA) 500 MG tablet Take 1 tablet (500 mg total) by mouth 2 (two) times daily. (Patient not taking: Reported on 09/07/2017)  . levocetirizine (XYZAL) 5 MG tablet Take 5 mg by mouth at bedtime.  Marland Kitchen losartan (COZAAR) 50 MG tablet take 1 tablet by mouth once daily  . medroxyPROGESTERone (DEPO-PROVERA) 150 MG/ML injection Inject 150 mg into the muscle every 3 (three) months.   . methotrexate (RHEUMATREX) 2.5 MG tablet Take 22.5 mg by mouth every Monday. Caution:Chemotherapy. Protect from light. Take 9 tabs po weekly with meals  . montelukast (SINGULAIR) 10 MG tablet Take 1 tablet (10 mg total) by mouth at  bedtime.  Marland Kitchen ORENCIA 125 MG/ML SOSY Inject 125 mg into the muscle every Friday.   . ranitidine (ZANTAC) 150 MG tablet Take 150 mg by mouth 2 (two) times daily as needed (for breakthrough acid reflux/indigestion.).   Marland Kitchen valACYclovir (VALTREX) 1000 MG tablet Take 1 tablet (1,000 mg total) by mouth 2 (two) times daily.   No facility-administered encounter medications on file as of 09/07/2017.     Review of Systems  Constitutional: Negative for appetite change and unexpected weight change.  HENT: Negative for congestion and sinus pressure.   Respiratory: Negative for cough, chest tightness and shortness of breath.   Cardiovascular: Negative for chest pain, palpitations and leg swelling.  Gastrointestinal: Negative for abdominal pain, diarrhea, nausea and vomiting.  Genitourinary: Negative for difficulty urinating and dysuria.  Musculoskeletal:       Hip pain as outlined.  Just had shoulder injected.    Skin: Negative for color change and rash.  Neurological: Negative for dizziness, light-headedness and headaches.  Psychiatric/Behavioral: Negative for agitation and dysphoric mood.       Objective:    Physical Exam  Constitutional: She appears well-developed and well-nourished. No distress.  HENT:  Nose: Nose normal.  Mouth/Throat: Oropharynx is clear and moist.  Neck: Neck supple. No thyromegaly present.  Cardiovascular: Normal rate and regular rhythm.  Pulmonary/Chest: Breath sounds normal. No respiratory distress. She has no wheezes.  Abdominal: Soft. Bowel sounds are normal. There is no tenderness.  Musculoskeletal: She exhibits no edema or tenderness.  Lymphadenopathy:    She has no cervical adenopathy.  Skin: No rash noted. No erythema.  Psychiatric: She has a normal mood and affect. Her behavior is normal.    BP 128/80 (BP Location: Left Arm, Patient Position: Sitting, Cuff Size: Large)   Pulse 74   Temp 98.3 F (36.8 C) (Oral)   Resp 18   Wt 172 lb 9.6 oz (78.3 kg)    SpO2 99%   BMI 32.61 kg/m  Wt Readings from Last 3 Encounters:  09/07/17 172 lb 9.6 oz (78.3 kg)  07/20/17 172 lb 3.2 oz (78.1 kg)  06/25/17 169 lb 15.6 oz (77.1 kg)     Lab Results  Component Value Date   WBC 6.5 06/22/2017   HGB 12.6 06/22/2017   HCT 37.9 06/22/2017   PLT 322 06/22/2017   GLUCOSE 91 06/22/2017   CHOL 187 01/19/2017   TRIG 57.0 01/19/2017   HDL 51.70 01/19/2017   LDLCALC 124 (H) 01/19/2017   ALT 14 01/19/2017   AST 17 01/19/2017   NA  139 06/22/2017   K 4.0 06/22/2017   CL 106 06/22/2017   CREATININE 0.83 06/22/2017   BUN 6 06/22/2017   CO2 25 06/22/2017   TSH 2.48 01/19/2017    Mr Lumbar Spine W/o Contrast  Result Date: 09/07/2017 CLINICAL DATA:  47 year old female with chronic right side low back pain radiating to the hip. Symptoms increase with activity. Recent normal right hip arthrogram. EXAM: MRI LUMBAR SPINE WITHOUT CONTRAST TECHNIQUE: Multiplanar, multisequence MR imaging of the lumbar spine was performed. No intravenous contrast was administered. COMPARISON:  Right hip MRI 08/14/2017. FINDINGS: Segmentation: Lumbar segmentation appears to be normal and will be designated as such for this report. Alignment: Mild straightening of lower lumbar lordosis. Subtle anterolisthesis of L5 on S1. Subtle retrolisthesis of L3 on L4. Vertebrae: No marrow edema or evidence of acute osseous abnormality. Visualized bone marrow signal is within normal limits. Intact visible sacrum and SI joints. Conus medullaris and cauda equina: Conus extends to the L1 level. Conus and cauda equina appear normal. Paraspinal and other soft tissues: Visualized abdominal viscera and paraspinal soft tissues are within normal limits. Disc levels: T11-T12: Disc desiccation. Small left subarticular disc protrusion (series 6, image 2). Mild involvement of both the left T11 neural foramen and left lateral recess at the descending left T12 nerve level. T12-L1:  Negative. L1-L2:  Negative. L2-L3:   Mild circumferential disc bulge.  No stenosis. L3-L4: Subtle retrolisthesis. Mild circumferential disc bulge with broad-based bilateral foraminal involvement. Mild bilateral L3 neural foraminal stenosis, perhaps greater on the right. No spinal or lateral recess stenosis. L4-L5: Mild vertebral endplate spurring. Borderline to mild facet hypertrophy. Borderline to mild bilateral L4 neural foraminal stenosis. L5-S1: Disc desiccation. Circumferential disc bulge plus broad-based central to slightly left paracentral mildly lobulated disc protrusion or extrusion (series 6, image 40). Superimposed mild facet hypertrophy. Superimposed endplate spurring. No significant spinal stenosis. There is moderate involvement at the descending left S1 nerve roots in the left lateral recess. There is mild involvement of the descending right S1 nerve roots. There is moderate left and mild right L5 neural foraminal stenosis. IMPRESSION: 1. L5-S1 disc degeneration with disc bulging, endplate spurring, and a superimposed small disc herniation. However, the changes are eccentric to the left. Perhaps the small disc herniation might be a source for right S1 radiculitis. There is mild right L5 neural foraminal stenosis, but no significant lumbar spinal stenosis. 2. Minimal degeneration at L4-L5 with only mild L4 neural foraminal stenosis. Mild degeneration at L3-L4 with mild L3 neural foraminal stenosis. Electronically Signed   By: Genevie Ann M.D.   On: 09/07/2017 09:51       Assessment & Plan:   Problem List Items Addressed This Visit    BMI 32.0-32.9,adult    Discussed diet and exercise.  Follow.        Environmental allergies    Stable on current regimen.  Sees an allergist.        Essential hypertension, benign    Blood pressure under good control.  Continue same medication regimen.  Follow pressures.  Follow metabolic panel.        Relevant Orders   Basic metabolic panel   GERD (gastroesophageal reflux disease)     Controlled on current regimen.  Follow.       Hypercholesterolemia    Low cholesterol diet and exercise.  Follow lipid panel.        Relevant Orders   Hepatic function panel   Lipid panel   Meningioma of left sphenoid  wing involving cavernous sinus ([facility])    Followed by neurosurgery.  S/p removal.  Has f/u 09/2017.        Rheumatoid arthritis ([place])    Followed by Dr Jefm Bryant.        Vitamin D deficiency    Follow vitamin D level.        Relevant Orders   VITAMIN D 25 Hydroxy (Vit-D Deficiency, Fractures)       Einar Pheasant, MD

## 2017-09-07 NOTE — Progress Notes (Signed)
Please refer to [facility] for [facility]

## 2017-09-09 ENCOUNTER — Other Ambulatory Visit (INDEPENDENT_AMBULATORY_CARE_PROVIDER_SITE_OTHER): Payer: Self-pay

## 2017-09-09 ENCOUNTER — Encounter: Payer: Self-pay | Admitting: Internal Medicine

## 2017-09-09 DIAGNOSIS — M545 Low back pain: Secondary | ICD-10-CM

## 2017-09-10 ENCOUNTER — Encounter: Payer: Self-pay | Admitting: Internal Medicine

## 2017-09-10 DIAGNOSIS — M5416 Radiculopathy, lumbar region: Secondary | ICD-10-CM | POA: Diagnosis not present

## 2017-09-10 DIAGNOSIS — M9903 Segmental and somatic dysfunction of lumbar region: Secondary | ICD-10-CM | POA: Diagnosis not present

## 2017-09-10 DIAGNOSIS — M9905 Segmental and somatic dysfunction of pelvic region: Secondary | ICD-10-CM | POA: Diagnosis not present

## 2017-09-10 NOTE — Assessment & Plan Note (Signed)
Blood pressure under good control.  Continue same medication regimen.  Follow pressures.  Follow metabolic panel.   

## 2017-09-10 NOTE — Assessment & Plan Note (Signed)
Discussed diet and exercise.  Follow.  

## 2017-09-10 NOTE — Assessment & Plan Note (Signed)
Stable on current regimen.  Sees an allergist.

## 2017-09-10 NOTE — Assessment & Plan Note (Signed)
Followed by Dr Kernodle.   

## 2017-09-10 NOTE — Assessment & Plan Note (Signed)
Followed by neurosurgery.  S/p removal.  Has f/u 09/2017.

## 2017-09-10 NOTE — Assessment & Plan Note (Signed)
Controlled on current regimen.  Follow.  

## 2017-09-10 NOTE — Assessment & Plan Note (Signed)
Follow vitamin D level.  

## 2017-09-10 NOTE — Assessment & Plan Note (Signed)
Low cholesterol diet and exercise.  Follow lipid panel.   

## 2017-09-11 ENCOUNTER — Other Ambulatory Visit: Payer: Self-pay | Admitting: Neurosurgery

## 2017-09-11 DIAGNOSIS — D329 Benign neoplasm of meninges, unspecified: Secondary | ICD-10-CM

## 2017-09-14 ENCOUNTER — Telehealth (INDEPENDENT_AMBULATORY_CARE_PROVIDER_SITE_OTHER): Payer: Self-pay | Admitting: Physical Medicine and Rehabilitation

## 2017-09-14 NOTE — Telephone Encounter (Signed)
Patient called wanting to reschedule her appointment on 4/8, possibly to the 15th? If possible. CB # [phone]

## 2017-09-14 NOTE — Telephone Encounter (Signed)
Rescheduled

## 2017-09-21 ENCOUNTER — Ambulatory Visit
Admission: RE | Admit: 2017-09-21 | Discharge: 2017-09-21 | Disposition: A | Discharge status: Home / Self Care | Payer: 59 | Source: Ambulatory Visit | Attending: Neurosurgery | Admitting: Neurosurgery

## 2017-09-21 DIAGNOSIS — D329 Benign neoplasm of meninges, unspecified: Secondary | ICD-10-CM

## 2017-09-21 DIAGNOSIS — D320 Benign neoplasm of cerebral meninges: Secondary | ICD-10-CM | POA: Diagnosis not present

## 2017-09-21 MED ORDER — GADOBENATE DIMEGLUMINE 529 MG/ML IV SOLN
15.0000 mL | Freq: Once | INTRAVENOUS | Status: AC | PRN
  Administered 2017-09-21: 15 mL via INTRAVENOUS

## 2017-09-28 DIAGNOSIS — I10 Essential (primary) hypertension: Secondary | ICD-10-CM | POA: Diagnosis not present

## 2017-09-28 DIAGNOSIS — D329 Benign neoplasm of meninges, unspecified: Secondary | ICD-10-CM | POA: Diagnosis not present

## 2017-09-28 DIAGNOSIS — Z6831 Body mass index (BMI) 31.0-31.9, adult: Secondary | ICD-10-CM | POA: Diagnosis not present

## 2017-10-05 ENCOUNTER — Other Ambulatory Visit: Payer: 59

## 2017-10-05 ENCOUNTER — Encounter (INDEPENDENT_AMBULATORY_CARE_PROVIDER_SITE_OTHER): Payer: Self-pay | Admitting: Physical Medicine and Rehabilitation

## 2017-10-06 ENCOUNTER — Ambulatory Visit: Payer: 59

## 2017-10-06 ENCOUNTER — Ambulatory Visit (INDEPENDENT_AMBULATORY_CARE_PROVIDER_SITE_OTHER): Payer: 59

## 2017-10-06 DIAGNOSIS — Z3042 Encounter for surveillance of injectable contraceptive: Secondary | ICD-10-CM | POA: Diagnosis not present

## 2017-10-06 MED ORDER — MEDROXYPROGESTERONE ACETATE 150 MG/ML IM SUSP
150.0000 mg | Freq: Once | INTRAMUSCULAR | Status: AC
  Administered 2017-10-06: 150 mg via INTRAMUSCULAR

## 2017-10-06 NOTE — Progress Notes (Addendum)
Patient receive Dep shot in left arm.  Patient is due for her next dose between the dates [id number].  Patient tolerated injection very well, had no questions, comments, or concerns.  Reviewed.  Dr Nicki Reaper

## 2017-10-07 ENCOUNTER — Other Ambulatory Visit: Payer: Self-pay | Admitting: Internal Medicine

## 2017-10-12 ENCOUNTER — Encounter (INDEPENDENT_AMBULATORY_CARE_PROVIDER_SITE_OTHER): Payer: Self-pay | Admitting: Physical Medicine and Rehabilitation

## 2017-10-12 ENCOUNTER — Other Ambulatory Visit: Payer: 59

## 2017-10-13 ENCOUNTER — Ambulatory Visit (INDEPENDENT_AMBULATORY_CARE_PROVIDER_SITE_OTHER): Payer: 59 | Admitting: Physical Medicine and Rehabilitation

## 2017-10-13 ENCOUNTER — Ambulatory Visit (INDEPENDENT_AMBULATORY_CARE_PROVIDER_SITE_OTHER): Payer: 59

## 2017-10-13 ENCOUNTER — Encounter (INDEPENDENT_AMBULATORY_CARE_PROVIDER_SITE_OTHER): Payer: Self-pay | Admitting: Physical Medicine and Rehabilitation

## 2017-10-13 VITALS — BP 137/87 | HR 72 | Temp 98.3°F

## 2017-10-13 DIAGNOSIS — M5416 Radiculopathy, lumbar region: Secondary | ICD-10-CM | POA: Diagnosis not present

## 2017-10-13 MED ORDER — BETAMETHASONE SOD PHOS & ACET 6 (3-3) MG/ML IJ SUSP
12.0000 mg | Freq: Once | INTRAMUSCULAR | Status: AC
  Administered 2017-10-13: 12 mg

## 2017-10-13 NOTE — Patient Instructions (Signed)

## 2017-10-13 NOTE — Progress Notes (Signed)
.  Numeric Pain Rating Scale and Functional Assessment Average Pain 4   In the last MONTH (on 0-10 scale) has pain interfered with the following?  1. General activity like being  able to carry out your everyday physical activities such as walking, climbing stairs, carrying groceries, or moving a chair?  Rating(2)   +Driver, -BT, -Dye Allergies.

## 2017-10-19 ENCOUNTER — Other Ambulatory Visit (INDEPENDENT_AMBULATORY_CARE_PROVIDER_SITE_OTHER): Payer: 59

## 2017-10-19 DIAGNOSIS — E7800 Pure hypercholesterolemia, unspecified: Secondary | ICD-10-CM | POA: Diagnosis not present

## 2017-10-19 DIAGNOSIS — E559 Vitamin D deficiency, unspecified: Secondary | ICD-10-CM | POA: Diagnosis not present

## 2017-10-19 DIAGNOSIS — I10 Essential (primary) hypertension: Secondary | ICD-10-CM

## 2017-10-19 LAB — LIPID PANEL
Cholesterol: 227 mg/dL — ABNORMAL HIGH (ref 0–200)
HDL: 61 mg/dL (ref >39.00)
LDL Cholesterol: 150 mg/dL — ABNORMAL HIGH (ref 0–99)
NonHDL: 165.62
Total CHOL/HDL Ratio: 4
Triglycerides: 79 mg/dL (ref 0.0–149.0)
VLDL: 15.8 mg/dL (ref 0.0–40.0)

## 2017-10-19 LAB — BASIC METABOLIC PANEL
BUN: 11 mg/dL (ref 6–23)
CO2: 22 mEq/L (ref 19–32)
Calcium: 10 mg/dL (ref 8.4–10.5)
Chloride: 105 mEq/L (ref 96–112)
Creatinine, Ser: 0.99 mg/dL (ref 0.40–1.20)
GFR: 76.81 mL/min (ref >60.00)
Glucose, Bld: 90 mg/dL (ref 70–99)
Potassium: 4.2 mEq/L (ref 3.5–5.1)
Sodium: 139 mEq/L (ref 135–145)

## 2017-10-19 LAB — HEPATIC FUNCTION PANEL
ALT: 18 U/L (ref 0–35)
AST: 18 U/L (ref 0–37)
Albumin: 4.3 g/dL (ref 3.5–5.2)
Alkaline Phosphatase: 73 U/L (ref 39–117)
Bilirubin, Direct: 0.1 mg/dL (ref 0.0–0.3)
Total Bilirubin: 0.7 mg/dL (ref 0.2–1.2)
Total Protein: 7.6 g/dL (ref 6.0–8.3)

## 2017-10-19 LAB — VITAMIN D 25 HYDROXY (VIT D DEFICIENCY, FRACTURES): VITD: 26.34 ng/mL — ABNORMAL LOW (ref 30.00–100.00)

## 2017-10-19 NOTE — Addendum Note (Signed)
Addended by: Arby Barrette on: 10/19/2017 09:44 AM   Modules accepted: Orders

## 2017-10-22 NOTE — Procedures (Signed)
S1 Lumbosacral Transforaminal Epidural Steroid Injection - Sub-Pedicular Approach with Fluoroscopic Guidance   Patient: Tina Parker      Date of Birth: 1971-04-23 MRN: [id number] PCP: Einar Pheasant, MD      Visit Date: 10/13/2017   Universal Protocol:    Date/Time: 04/25/196:19 AM  Consent Given By: the patient  Position:  PRONE  Additional Comments: Vital signs were monitored before and after the procedure. Patient was prepped and draped in the usual sterile fashion. The correct patient, procedure, and site was verified.   Injection Procedure Details:  Procedure Site One Meds Administered:  Meds ordered this encounter  Medications  . betamethasone acetate-betamethasone sodium phosphate (CELESTONE) injection 12 mg    Laterality: Bilateral  Location/Site:  S1 Foramen   Needle size: 22 ga.  Needle type: Spinal  Needle Placement: Transforaminal  Findings:   -Comments: Excellent flow of contrast along the nerve and into the epidural space.  Procedure Details: After squaring off the sacral end-plate to get a true AP view, the C-arm was positioned so that the best possible view of the S1 foramen was visualized. The soft tissues overlying this structure were infiltrated with 2-3 ml. of 1% Lidocaine without Epinephrine.    The spinal needle was inserted toward the target using a "trajectory" view along the fluoroscope beam.  Under AP and lateral visualization, the needle was advanced so it did not puncture dura. Biplanar projections were used to confirm position. Aspiration was confirmed to be negative for CSF and/or blood. A 1-2 ml. volume of Isovue-250 was injected and flow of contrast was noted at each level. Radiographs were obtained for documentation purposes.   After attaining the desired flow of contrast documented above, a 0.5 to 1.0 ml test dose of 0.25% Marcaine was injected into each respective transforaminal space.  The patient was observed for 90 seconds  post injection.  After no sensory deficits were reported, and normal lower extremity motor function was noted,   the above injectate was administered so that equal amounts of the injectate were placed at each foramen (level) into the transforaminal epidural space.   Additional Comments:  The patient tolerated the procedure well Dressing: Band-Aid    Post-procedure details: Patient was observed during the procedure. Post-procedure instructions were reviewed.  Patient left the clinic in stable condition.

## 2017-10-22 NOTE — Progress Notes (Signed)
Tina Parker - 47 y.o. female MRN [id number]  Date of birth: [birth year]  Office Visit Note: Visit Date: 10/13/2017 PCP: Einar Pheasant, MD Referred by: Einar Pheasant, MD  Subjective: Chief Complaint  Patient presents with  . Lower Back - Pain  . Right Hip - Pain  . Left Hip - Pain   HPI: Tina Parker is a 47 year old female that comes in today at the request of Dr. Wyline Copas for lumbar epidural injection.  She is having predominantly left radicular pain but also right buttock pain.  She has chronic worsening degenerative changes at L5-S1 with facet arthropathy broad disc bulging and disc extrusion which is left paracentral but really is probably affecting both S1 nerve roots in the lateral recesses bilaterally.  She reports worsening with walking and some relief with laying flat.  Medications have not been very helpful.  She has had no prior lumbar surgery.  MRI is reviewed below.  We are going to complete bilateral S1 transforaminal epidural steroid injection.  The injection  will be diagnostic and hopefully therapeutic. The patient has failed conservative care including time, medications and activity modification.   ROS Otherwise per HPI.  Assessment & Plan: Visit Diagnoses:  1. Lumbar radiculopathy     Plan: No additional findings.   Meds & Orders:  Meds ordered this encounter  Medications  . betamethasone acetate-betamethasone sodium phosphate (CELESTONE) injection 12 mg    Orders Placed This Encounter  Procedures  . XR C-ARM NO REPORT  . Epidural Steroid injection    Follow-up: Return if symptoms worsen or fail to improve, for Dr. Erlinda Hong.   Procedures: No procedures performed  S1 Lumbosacral Transforaminal Epidural Steroid Injection - Sub-Pedicular Approach with Fluoroscopic Guidance   Patient: Tina Parker      Date of Birth: Apr 10, 1971 MRN: [id number] PCP: Einar Pheasant, MD      Visit Date: 10/13/2017   Universal Protocol:    Date/Time: 04/25/196:19  AM  Consent Given By: the patient  Position:  PRONE  Additional Comments: Vital signs were monitored before and after the procedure. Patient was prepped and draped in the usual sterile fashion. The correct patient, procedure, and site was verified.   Injection Procedure Details:  Procedure Site One Meds Administered:  Meds ordered this encounter  Medications  . betamethasone acetate-betamethasone sodium phosphate (CELESTONE) injection 12 mg    Laterality: Bilateral  Location/Site:  S1 Foramen   Needle size: 22 ga.  Needle type: Spinal  Needle Placement: Transforaminal  Findings:   -Comments: Excellent flow of contrast along the nerve and into the epidural space.  Procedure Details: After squaring off the sacral end-plate to get a true AP view, the C-arm was positioned so that the best possible view of the S1 foramen was visualized. The soft tissues overlying this structure were infiltrated with 2-3 ml. of 1% Lidocaine without Epinephrine.    The spinal needle was inserted toward the target using a "trajectory" view along the fluoroscope beam.  Under AP and lateral visualization, the needle was advanced so it did not puncture dura. Biplanar projections were used to confirm position. Aspiration was confirmed to be negative for CSF and/or blood. A 1-2 ml. volume of Isovue-250 was injected and flow of contrast was noted at each level. Radiographs were obtained for documentation purposes.   After attaining the desired flow of contrast documented above, a 0.5 to 1.0 ml test dose of 0.25% Marcaine was injected into each respective transforaminal space.  The patient was observed for  90 seconds post injection.  After no sensory deficits were reported, and normal lower extremity motor function was noted,   the above injectate was administered so that equal amounts of the injectate were placed at each foramen (level) into the transforaminal epidural space.   Additional Comments:   The patient tolerated the procedure well Dressing: Band-Aid    Post-procedure details: Patient was observed during the procedure. Post-procedure instructions were reviewed.  Patient left the clinic in stable condition.   Clinical History: MRI LUMBAR SPINE WITHOUT CONTRAST  TECHNIQUE: Multiplanar, multisequence MR imaging of the lumbar spine was performed. No intravenous contrast was administered.  COMPARISON:  Right hip MRI 08/14/2017.  FINDINGS: Segmentation: Lumbar segmentation appears to be normal and will be designated as such for this report.  Alignment: Mild straightening of lower lumbar lordosis. Subtle anterolisthesis of L5 on S1. Subtle retrolisthesis of L3 on L4.  Vertebrae: No marrow edema or evidence of acute osseous abnormality. Visualized bone marrow signal is within normal limits. Intact visible sacrum and SI joints.  Conus medullaris and cauda equina: Conus extends to the L1 level. Conus and cauda equina appear normal.  Paraspinal and other soft tissues: Visualized abdominal viscera and paraspinal soft tissues are within normal limits.  Disc levels:  T11-T12: Disc desiccation. Small left subarticular disc protrusion (series 6, image 2). Mild involvement of both the left T11 neural foramen and left lateral recess at the descending left T12 nerve level.  T12-L1:  Negative.  L1-L2:  Negative.  L2-L3:  Mild circumferential disc bulge.  No stenosis.  L3-L4: Subtle retrolisthesis. Mild circumferential disc bulge with broad-based bilateral foraminal involvement. Mild bilateral L3 neural foraminal stenosis, perhaps greater on the right. No spinal or lateral recess stenosis.  L4-L5: Mild vertebral endplate spurring. Borderline to mild facet hypertrophy. Borderline to mild bilateral L4 neural foraminal stenosis.  L5-S1: Disc desiccation. Circumferential disc bulge plus broad-based central to slightly left paracentral mildly lobulated  disc protrusion or extrusion (series 6, image 40). Superimposed mild facet hypertrophy. Superimposed endplate spurring. No significant spinal stenosis. There is moderate involvement at the descending left S1 nerve roots in the left lateral recess. There is mild involvement of the descending right S1 nerve roots. There is moderate left and mild right L5 neural foraminal stenosis.  IMPRESSION: 1. L5-S1 disc degeneration with disc bulging, endplate spurring, and a superimposed small disc herniation. However, the changes are eccentric to the left. Perhaps the small disc herniation might be a source for right S1 radiculitis. There is mild right L5 neural foraminal stenosis, but no significant lumbar spinal stenosis. 2. Minimal degeneration at L4-L5 with only mild L4 neural foraminal stenosis. Mild degeneration at L3-L4 with mild L3 neural foraminal stenosis.   Electronically Signed   By: Genevie Ann M.D.   On: 09/07/2017 09:51   She reports that she has never smoked. She has never used smokeless tobacco. No results for input(s): HGBA1C, LABURIC in the last 8760 hours.  Objective:  VS:  HT:    WT:   BMI:     BP:137/87  HR:72bpm  TEMP:98.3 F (36.8 C)(Oral)  RESP:100 % Physical Exam  Ortho Exam Imaging: No results found.  Past Medical/Family/Surgical/Social History: Medications & Allergies reviewed per EMR, new medications updated. Patient Active Problem List   Diagnosis Date Noted  . Herpes 07/23/2017  . 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 ([place]) 06/25/2017  . Pain of both hip joints 05/18/2017  . Vitamin D deficiency 09/21/2016  . Cough 01/07/2015  .  Difficulty sleeping 01/07/2015  . Health care maintenance 08/02/2014  . Snoring 03/19/2014  . Dysphagia 01/15/2014  . BMI 32.0-32.9,adult 12/11/2013  . Skin lesion 12/11/2013  . Family history of breast cancer 10/22/2013  . Environmental allergies 10/22/2013  . Essential hypertension, benign  09/15/2012  . Hypercholesterolemia 09/15/2012  . Rheumatoid arthritis ([place]) 09/15/2012  . GERD (gastroesophageal reflux disease) 09/15/2012  . Family history of colon cancer 09/15/2012  . Anemia 09/15/2012  . Hypothyroidism 09/15/2012   Past Medical History:  Diagnosis Date  . Allergic rhinitis   . Anemia   . Arthritis    RA  . Carpal tunnel syndrome   . Dysphagia   . GERD (gastroesophageal reflux disease)   . Graves disease    remission, no ablation, positive medical treatment  . History of hiatal hernia   . Hypertension   . Migraine headache   . PPD positive    hepatitis secondary to [place]  . Pure hypercholesterolemia   . Rheumatoid arthritis(714.0)    positive anti CCP antibodies, positive RF, oligo-articular, MTX   Family History  Problem Relation Age of Onset  . Cancer Mother        Breast Cancer  . Breast cancer Mother 74  . Cancer Father        Colon Cancer  . Heart disease Father        Hx of MI  . Hypertension Father   . Diabetes Father   . Cancer Maternal Grandmother        lung cancer  . Cancer Maternal Uncle        esophageal   Past Surgical History:  Procedure Laterality Date  . CARPAL TUNNEL RELEASE Right   . CESAREAN SECTION  1996  . CRANIOTOMY Left 06/25/2017   Procedure: CRANIOTOMY TEMPORAL LEFT FOR TUMOR RESECTION;  Surgeon: Ashok Pall, MD;  Location: [facility];  Service: Neurosurgery;  Laterality: Left;  . Post Septoplasty and turbinate reduction  2007  . TRIGGER FINGER RELEASE     Social History   Occupational History  . Not on file  Tobacco Use  . Smoking status: Never Smoker  . Smokeless tobacco: Never Used  Substance and Sexual Activity  . Alcohol use: Yes    Alcohol/week: 0.0 oz    Comment: occasionally  . Drug use: No  . Sexual activity: Not on file

## 2017-11-16 DIAGNOSIS — M0579 Rheumatoid arthritis with rheumatoid factor of multiple sites without organ or systems involvement: Secondary | ICD-10-CM | POA: Diagnosis not present

## 2017-12-08 DIAGNOSIS — D329 Benign neoplasm of meninges, unspecified: Secondary | ICD-10-CM | POA: Diagnosis not present

## 2017-12-17 ENCOUNTER — Ambulatory Visit: Payer: Self-pay

## 2017-12-17 DIAGNOSIS — D329 Benign neoplasm of meninges, unspecified: Secondary | ICD-10-CM | POA: Diagnosis not present

## 2017-12-17 DIAGNOSIS — D320 Benign neoplasm of cerebral meninges: Secondary | ICD-10-CM | POA: Diagnosis not present

## 2017-12-17 NOTE — Telephone Encounter (Signed)
Patient triaged and scheduled to see Dr. Aundra Dubin on 12-21-17.

## 2017-12-17 NOTE — Telephone Encounter (Signed)
Returned call to pt.  Reported she has had increase in swelling of ankles and feet, bilaterally over past 1.5 weeks; left ankle > right ankle.  Described as "mild to moderate swelling.  Also has swelling of both hands, over past week.  Stated she knows there have been several changes of the manufacturer, in supplying the Losartan, and unsure if this has anything to do with the increase in swelling.  Denied shortness of breath or cough.  Denied fever/ chills today.  Stated had low grade fever of 100 degrees the past 2 days with sinus drainage and sore throat, but these symptoms have improved.     Advised pt. will send this note to Dr. Nicki Reaper, and make her aware of the increase in swelling, since there are no available openings in the next few weeks. Given care advice per protocol.  Verb. Understanding.  Agrees with plan.      Reason for Disposition . [1] MODERATE leg swelling (e.g., swelling extends up to knees) AND [2] new onset or worsening  Answer Assessment - Initial Assessment Questions 1. ONSET: "When did the swelling start?" (e.g., minutes, hours, days)     Worsening of swelling in legs and ankles x 1-2 weeks ago; hand swelling x 1 week  2. LOCATION: "What part of the leg is swollen?"  "Are both legs swollen or just one leg?"     Ankles to knees ; left leg ankle is > right; swelling in hands   3. SEVERITY: "How bad is the swelling?" (e.g., localized; mild, moderate, severe)  - Localized - small area of swelling localized to one leg  - MILD pedal edema - swelling limited to foot and ankle, pitting edema < 1/4 inch (6 mm) deep, rest and elevation eliminate most or all swelling  - MODERATE edema - swelling of lower leg to knee, pitting edema > 1/4 inch (6 mm) deep, rest and elevation only partially reduce swelling  - SEVERE edema - swelling extends above knee, facial or hand swelling present      Mild to moderate 4. REDNESS: "Does the swelling look red or infected?"     Denied  5. PAIN: "Is  the swelling painful to touch?" If so, ask: "How painful is it?"   (Scale 1-10; mild, moderate or severe)    Hard to determine the pain due to RA 6. FEVER: "Do you have a fever?" If so, ask: "What is it, how was it measured, and when did it start?"     Denied fever today; low grade past 2 days of 100 degrees 7. CAUSE: "What do you think is causing the leg swelling?"     Unknown; reported change in manufacturer of Losartan  8. MEDICAL HISTORY: "Do you have a history of heart failure, kidney disease, liver failure, or cancer?"    Denied CHF, or Kidney Disease  9. RECURRENT SYMPTOM: "Have you had leg swelling before?" If so, ask: "When was the last time?" "What happened that time?"     Ongoing issue; swelling is somewhat worse in the ankle and legs; new swelling in hands 10. OTHER SYMPTOMS: "Do you have any other symptoms?" (e.g., chest pain, difficulty breathing)       Sinus drainage and sore throat x 2 days; denied shortness of breath or cough  11. PREGNANCY: "Is there any chance you are pregnant?" "When was your last menstrual period?"       No; uses Depo  Protocols used: LEG SWELLING AND EDEMA-A-AH  Message from [place]  Lavetta Nielsen sent at 12/17/2017 11:04 AM EDT   Summary: water retension in legs/ankles/hands   Pt states Dr. Nicki Reaper is aware of the retension in legs and ankles. She is now having in her hands started this week. Pt also having dry mouth for about 2 weeks. Pt is wondering if a medication she is taking might be causing it. Pt states it is not severe. It has been happening before. No pain or redness. Pt verified med list. Please return call.

## 2017-12-21 ENCOUNTER — Encounter: Payer: Self-pay | Admitting: Internal Medicine

## 2017-12-21 ENCOUNTER — Ambulatory Visit: Payer: 59 | Admitting: Internal Medicine

## 2017-12-21 VITALS — BP 120/72 | HR 79 | Temp 98.6°F | Ht 61.0 in | Wt 168.6 lb

## 2017-12-21 DIAGNOSIS — R600 Localized edema: Secondary | ICD-10-CM

## 2017-12-21 DIAGNOSIS — M059 Rheumatoid arthritis with rheumatoid factor, unspecified: Secondary | ICD-10-CM

## 2017-12-21 DIAGNOSIS — I10 Essential (primary) hypertension: Secondary | ICD-10-CM

## 2017-12-21 DIAGNOSIS — Z1329 Encounter for screening for other suspected endocrine disorder: Secondary | ICD-10-CM

## 2017-12-21 DIAGNOSIS — R682 Dry mouth, unspecified: Secondary | ICD-10-CM | POA: Diagnosis not present

## 2017-12-21 DIAGNOSIS — Z8639 Personal history of other endocrine, nutritional and metabolic disease: Secondary | ICD-10-CM

## 2017-12-21 DIAGNOSIS — Z86011 Personal history of benign neoplasm of the brain: Secondary | ICD-10-CM

## 2017-12-21 LAB — T4, FREE: Free T4: 0.71 ng/dL (ref 0.60–1.60)

## 2017-12-21 LAB — TSH: TSH: 2.76 u[IU]/mL (ref 0.35–4.50)

## 2017-12-21 MED ORDER — HYDROCHLOROTHIAZIDE 12.5 MG PO TABS: 12.5000 mg | ORAL_TABLET | Freq: Every day | ORAL | 1 refills | Status: DC

## 2017-12-21 NOTE — Patient Instructions (Addendum)
Try Biotene  Stop norvasc and start hctz 12.5 mg in the am    Edema Edema is an abnormal buildup of fluids in your bodytissues. Edema is somewhatdependent on gravity to pull the fluid to the lowest place in your body. That makes the condition more common in the legs and thighs (lower extremities). Painless swelling of the feet and ankles is common and becomes more likely as you get older. It is also common in looser tissues, like around your eyes. When the affected area is squeezed, the fluid may move out of that spot and leave a dent for a few moments. This dent is called pitting. What are the causes? There are many possible causes of edema. Eating too much salt and being on your feet or sitting for a long time can cause edema in your legs and ankles. Hot weather may make edema worse. Common medical causes of edema include:  Heart failure.  Liver disease.  Kidney disease.  Weak blood vessels in your legs.  Cancer.  An injury.  Pregnancy.  Some medications.  Obesity.  What are the signs or symptoms? Edema is usually painless.Your skin may look swollen or shiny. How is this diagnosed? Your health care provider may be able to diagnose edema by asking about your medical history and doing a physical exam. You may need to have tests such as X-rays, an electrocardiogram, or blood tests to check for medical conditions that may cause edema. How is this treated? Edema treatment depends on the cause. If you have heart, liver, or kidney disease, you need the treatment appropriate for these conditions. General treatment may include:  Elevation of the affected body part above the level of your heart.  Compression of the affected body part. Pressure from elastic bandages or support stockings squeezes the tissues and forces fluid back into the blood vessels. This keeps fluid from entering the tissues.  Restriction of fluid and salt intake.  Use of a water pill (diuretic). These  medications are appropriate only for some types of edema. They pull fluid out of your body and make you urinate more often. This gets rid of fluid and reduces swelling, but diuretics can have side effects. Only use diuretics as directed by your health care provider.  Follow these instructions at home:  Keep the affected body part above the level of your heart when you are lying down.  Do not sit still or stand for prolonged periods.  Do not put anything directly under your knees when lying down.  Do not wear constricting clothing or garters on your upper legs.  Exercise your legs to work the fluid back into your blood vessels. This may help the swelling go down.  Wear elastic bandages or support stockings to reduce ankle swelling as directed by your health care provider.  Eat a low-salt diet to reduce fluid if your health care provider recommends it.  Only take medicines as directed by your health care provider. Contact a health care provider if:  Your edema is not responding to treatment.  You have heart, liver, or kidney disease and notice symptoms of edema.  You have edema in your legs that does not improve after elevating them.  You have sudden and unexplained weight gain. Get help right away if:  You develop shortness of breath or chest pain.  You cannot breathe when you lie down.  You develop pain, redness, or warmth in the swollen areas.  You have heart, liver, or kidney disease and suddenly  get edema.  You have a fever and your symptoms suddenly get worse. This information is not intended to replace advice given to you by your health care provider. Make sure you discuss any questions you have with your health care provider. Document Released: 06/16/2005 Document Revised: 11/22/2015 Document Reviewed: 04/08/2013 Elsevier Interactive Patient Education  2017 [employer].

## 2017-12-21 NOTE — Progress Notes (Signed)
Pre visit review using our clinic review tool, if applicable. No additional management support is needed unless otherwise documented below in the visit note. 

## 2017-12-21 NOTE — Progress Notes (Signed)
No chief complaint on file.  F/u  1. C/o dry mouth reviewed labs 2015 SSA + 2. C/o b/l ankle/leg edema worse after standing with work and hand/finger swelling getting worse over the last week but better this am. She reports h/o RA but this is not typical of RA and sees Dr. Jefm Bryant and also not doing increased salt intake and reduced her meat intake. She has a h/o graves disease as well. CMET reviewed 10/19/17 normal  3. HTN controlled on norvasc 5 mg qd and cozaar 50 mg qd used to be on hyzaar 50-12.5 and hctz 25 mg at one pt.  4. H/o meningioma and 7 mm subependymoma has NS appt 01/04/18 and also c/o left facial swelling   Review of Systems  Constitutional: Positive for weight loss.  HENT: Negative for hearing loss.        +dry mouth  Eyes: Negative for blurred vision.  Respiratory: Negative for shortness of breath.   Cardiovascular: Positive for leg swelling. Negative for chest pain.  Skin: Negative for rash.  Neurological: Negative for headaches.   Past Medical History:  Diagnosis Date  . Allergic rhinitis   . Anemia   . Arthritis    RA  . Carpal tunnel syndrome   . Dysphagia   . GERD (gastroesophageal reflux disease)   . Graves disease    remission, no ablation, positive medical treatment  . History of hiatal hernia   . Hypertension   . Migraine headache   . PPD positive    hepatitis secondary to [place]  . Pure hypercholesterolemia   . Rheumatoid arthritis(714.0)    positive anti CCP antibodies, positive RF, oligo-articular, MTX   Past Surgical History:  Procedure Laterality Date  . CARPAL TUNNEL RELEASE Right   . CESAREAN SECTION  1996  . CRANIOTOMY Left 06/25/2017   Procedure: CRANIOTOMY TEMPORAL LEFT FOR TUMOR RESECTION;  Surgeon: Ashok Pall, MD;  Location: [facility];  Service: Neurosurgery;  Laterality: Left;  . Post Septoplasty and turbinate reduction  2007  . TRIGGER FINGER RELEASE     Family History  Problem Relation Age of Onset  . Cancer Mother        Breast  Cancer  . Breast cancer Mother 47  . Cancer Father        Colon Cancer  . Heart disease Father        Hx of MI  . Hypertension Father   . Diabetes Father   . Cancer Maternal Grandmother        lung cancer  . Cancer Maternal Uncle        esophageal   Social History   Socioeconomic History  . Marital status: Married    Spouse name: Not on file  . Number of children: Not on file  . Years of education: Not on file  . Highest education level: Not on file  Occupational History  . Not on file  Social Needs  . Financial resource strain: Not on file  . Food insecurity:    Worry: Not on file    Inability: Not on file  . Transportation needs:    Medical: Not on file    Non-medical: Not on file  Tobacco Use  . Smoking status: Never Smoker  . Smokeless tobacco: Never Used  Substance and Sexual Activity  . Alcohol use: Yes    Alcohol/week: 0.0 oz    Comment: occasionally  . Drug use: No  . Sexual activity: Not on file  Lifestyle  . Physical  activity:    Days per week: Not on file    Minutes per session: Not on file  . Stress: Not on file  Relationships  . Social connections:    Talks on phone: Not on file    Gets together: Not on file    Attends religious service: Not on file    Active member of club or organization: Not on file    Attends meetings of clubs or organizations: Not on file    Relationship status: Not on file  . Intimate partner violence:    Fear of current or ex partner: Not on file    Emotionally abused: Not on file    Physically abused: Not on file    Forced sexual activity: Not on file  Other Topics Concern  . Not on file  Social History Narrative  . Not on file   Current Meds  Medication Sig  . albuterol (PROVENTIL HFA;VENTOLIN HFA) 108 (90 BASE) MCG/ACT inhaler Inhale 2 puffs into the lungs every 6 (six) hours as needed for wheezing or shortness of breath.  . Dexlansoprazole (DEXILANT) 30 MG capsule Take 30 mg by mouth daily.  Marland Kitchen ibuprofen  (ADVIL,MOTRIN) 800 MG tablet Take 800 mg by mouth 3 (three) times daily as needed. TID PRN for pain  . levocetirizine (XYZAL) 5 MG tablet Take 5 mg by mouth at bedtime.  Marland Kitchen losartan (COZAAR) 50 MG tablet take 1 tablet by mouth once daily  . medroxyPROGESTERone (DEPO-PROVERA) 150 MG/ML injection Inject 150 mg into the muscle every 3 (three) months.   . methotrexate (RHEUMATREX) 2.5 MG tablet Take 22.5 mg by mouth every Monday. Caution:Chemotherapy. Protect from light. Take 9 tabs po weekly with meals  . montelukast (SINGULAIR) 10 MG tablet take 1 tablet by mouth at bedtime  . ORENCIA 125 MG/ML SOSY Inject 125 mg into the muscle every Friday.   . ranitidine (ZANTAC) 150 MG tablet Take 150 mg by mouth 2 (two) times daily as needed (for breakthrough acid reflux/indigestion.).   Marland Kitchen valACYclovir (VALTREX) 1000 MG tablet Take 1 tablet (1,000 mg total) by mouth 2 (two) times daily.  . [DISCONTINUED] amLODipine (NORVASC) 5 MG tablet take 1 tablet by mouth once daily  . [DISCONTINUED] dexamethasone (DECADRON) 4 MG tablet Take two tablets for two days, then 1 tablet for two days then stop.  . [DISCONTINUED] levETIRAcetam (KEPPRA) 500 MG tablet Take 1 tablet (500 mg total) by mouth 2 (two) times daily.   Allergies  Allergen Reactions  . Inh [Isoniazid] Other (See Comments)    Causes hepatitis   Recent Results (from the past 2160 hour(s))  Basic metabolic panel     Status: None   Collection Time: 10/19/17  9:44 AM  Result Value Ref Range   Sodium 139 135 - 145 mEq/L   Potassium 4.2 3.5 - 5.1 mEq/L   Chloride 105 96 - 112 mEq/L   CO2 22 19 - 32 mEq/L   Glucose, Bld 90 70 - 99 mg/dL   BUN 11 6 - 23 mg/dL   Creatinine, Ser 0.99 0.40 - 1.20 mg/dL   Calcium 10.0 8.4 - 10.5 mg/dL   GFR 76.81 >60.00 mL/min  VITAMIN D 25 Hydroxy (Vit-D Deficiency, Fractures)     Status: Abnormal   Collection Time: 10/19/17  9:44 AM  Result Value Ref Range   VITD 26.34 (L) 30.00 - 100.00 ng/mL  Lipid panel     Status:  Abnormal   Collection Time: 10/19/17  9:44 AM  Result Value Ref  Range   Cholesterol 227 (H) 0 - 200 mg/dL    Comment: ATP III Classification       Desirable:  < 200 mg/dL               Borderline High:  200 - 239 mg/dL          High:  > = 240 mg/dL   Triglycerides 79.0 0.0 - 149.0 mg/dL    Comment: Normal:  <150 mg/dLBorderline High:  150 - 199 mg/dL   HDL 61.00 >39.00 mg/dL   VLDL 15.8 0.0 - 40.0 mg/dL   LDL Cholesterol 150 (H) 0 - 99 mg/dL   Total CHOL/HDL Ratio 4     Comment:                Men          Women1/2 Average Risk     3.4          3.3Average Risk          5.0          4.42X Average Risk          9.6          7.13X Average Risk          15.0          11.0                       NonHDL 165.62     Comment: NOTE:  Non-HDL goal should be 30 mg/dL higher than patient's LDL goal (i.e. LDL goal of < 70 mg/dL, would have non-HDL goal of < 100 mg/dL)  Hepatic function panel     Status: None   Collection Time: 10/19/17  9:44 AM  Result Value Ref Range   Total Bilirubin 0.7 0.2 - 1.2 mg/dL   Bilirubin, Direct 0.1 0.0 - 0.3 mg/dL   Alkaline Phosphatase 73 39 - 117 U/L   AST 18 0 - 37 U/L   ALT 18 0 - 35 U/L   Total Protein 7.6 6.0 - 8.3 g/dL   Albumin 4.3 3.5 - 5.2 g/dL   Objective  Body mass index is 31.86 kg/m. Wt Readings from Last 3 Encounters:  12/21/17 168 lb 9.6 oz (76.5 kg)  09/07/17 172 lb 9.6 oz (78.3 kg)  07/20/17 172 lb 3.2 oz (78.1 kg)   Temp Readings from Last 3 Encounters:  12/21/17 98.6 F (37 C) (Oral)  10/13/17 98.3 F (36.8 C) (Oral)  09/07/17 98.3 F (36.8 C) (Oral)   BP Readings from Last 3 Encounters:  12/21/17 120/72  10/13/17 137/87  09/07/17 128/80   Pulse Readings from Last 3 Encounters:  12/21/17 79  10/13/17 72  09/07/17 74    Physical Exam  Constitutional: She is oriented to person, place, and time. Vital signs are normal. She appears well-developed and well-nourished. She is cooperative.  HENT:  Head: Normocephalic and atraumatic.   Mouth/Throat: Oropharynx is clear and moist and mucous membranes are normal.  Eyes: Pupils are equal, round, and reactive to light. Conjunctivae are normal.  Cardiovascular: Normal rate, regular rhythm and normal heart sounds.  1+ leg edema b/l  Hands w/o significant pitting edema   Pulmonary/Chest: Effort normal and breath sounds normal.  Neurological: She is alert and oriented to person, place, and time. Gait normal.  Skin: Skin is warm, dry and intact.  Psychiatric: She has a normal mood and affect. Her speech is normal and behavior is  normal. Judgment and thought content normal. Cognition and memory are normal.  Nursing note and vitals reviewed.   Assessment   1. Edema legs/hands ? Etiology  2 .dry mouth SSA + 2015  3. H/o RA dx'ed 2013  4. HTN  5. H/o meningioma s/p resection 05/2017 left MRI reviewed no residual but 7 mm subependymoma  Plan   1. CMET nl 10/19/17  D/c norvasc 5 mg and add hctz 12.5 mg qd  Check TSH, T4 (h/o Graves), ANA Disc compression stockings  2. Biotene rec see labs above  3. F/u Dr. Jefm Bryant cont meds  3. See above #1  Continue losartan 50 mg qd  5. F/u NS 01/04/18   Of note pt will wait for depo until appt 01/04/18    Provider: Dr. Olivia Mackie McLean-Scocuzza-[facility] Medicine

## 2017-12-22 ENCOUNTER — Ambulatory Visit: Payer: 59

## 2017-12-23 LAB — ANTI-NUCLEAR AB-TITER (ANA TITER): ANA Titer 1: 1:320 {titer} — ABNORMAL HIGH

## 2017-12-23 LAB — SJOGRENS SYNDROME-B EXTRACTABLE NUCLEAR ANTIBODY: SSB (La) (ENA) Antibody, IgG: <1 AI

## 2017-12-23 LAB — ANA: Anti Nuclear Antibody(ANA): POSITIVE — AB

## 2017-12-23 LAB — SJOGRENS SYNDROME-A EXTRACTABLE NUCLEAR ANTIBODY: SSA (Ro) (ENA) Antibody, IgG: <1 AI

## 2017-12-30 ENCOUNTER — Encounter: Payer: Self-pay | Admitting: *Deleted

## 2018-01-04 ENCOUNTER — Other Ambulatory Visit: Payer: Self-pay | Admitting: Internal Medicine

## 2018-01-04 ENCOUNTER — Encounter: Payer: 59 | Admitting: Internal Medicine

## 2018-01-04 DIAGNOSIS — D329 Benign neoplasm of meninges, unspecified: Secondary | ICD-10-CM | POA: Diagnosis not present

## 2018-01-04 DIAGNOSIS — I10 Essential (primary) hypertension: Secondary | ICD-10-CM | POA: Diagnosis not present

## 2018-01-05 ENCOUNTER — Ambulatory Visit (INDEPENDENT_AMBULATORY_CARE_PROVIDER_SITE_OTHER): Payer: 59

## 2018-01-05 DIAGNOSIS — Z3042 Encounter for surveillance of injectable contraceptive: Secondary | ICD-10-CM | POA: Diagnosis not present

## 2018-01-05 MED ORDER — MEDROXYPROGESTERONE ACETATE 150 MG/ML IM SUSP
150.0000 mg | Freq: Once | INTRAMUSCULAR | Status: AC
  Administered 2018-01-05: 150 mg via INTRAMUSCULAR

## 2018-01-05 MED ORDER — LOSARTAN POTASSIUM 50 MG PO TABS: 50.0000 mg | ORAL_TABLET | Freq: Every day | ORAL | 5 refills | Status: DC

## 2018-01-05 NOTE — Progress Notes (Addendum)
Patient comes in today for a Depo injection. Administered in right deltoid IM. Patient is due for next dose between 03/23/18-04/06/2018. Patient tolerated well.  Reviewed.  Dr Nicki Reaper

## 2018-01-11 ENCOUNTER — Other Ambulatory Visit: Payer: Self-pay | Admitting: Internal Medicine

## 2018-01-11 DIAGNOSIS — Z1231 Encounter for screening mammogram for malignant neoplasm of breast: Secondary | ICD-10-CM

## 2018-01-19 DIAGNOSIS — J0100 Acute maxillary sinusitis, unspecified: Secondary | ICD-10-CM | POA: Diagnosis not present

## 2018-01-19 DIAGNOSIS — J209 Acute bronchitis, unspecified: Secondary | ICD-10-CM | POA: Diagnosis not present

## 2018-02-03 DIAGNOSIS — M25572 Pain in left ankle and joints of left foot: Secondary | ICD-10-CM | POA: Diagnosis not present

## 2018-02-03 DIAGNOSIS — M79671 Pain in right foot: Secondary | ICD-10-CM | POA: Diagnosis not present

## 2018-02-03 DIAGNOSIS — M79672 Pain in left foot: Secondary | ICD-10-CM | POA: Diagnosis not present

## 2018-02-15 ENCOUNTER — Ambulatory Visit
Admission: RE | Admit: 2018-02-15 | Discharge: 2018-02-15 | Disposition: A | Discharge status: Home / Self Care | Payer: 59 | Source: Ambulatory Visit | Attending: Internal Medicine | Admitting: Internal Medicine

## 2018-02-15 DIAGNOSIS — Z1231 Encounter for screening mammogram for malignant neoplasm of breast: Secondary | ICD-10-CM | POA: Diagnosis not present

## 2018-02-15 DIAGNOSIS — M0579 Rheumatoid arthritis with rheumatoid factor of multiple sites without organ or systems involvement: Secondary | ICD-10-CM | POA: Diagnosis not present

## 2018-02-22 DIAGNOSIS — M0579 Rheumatoid arthritis with rheumatoid factor of multiple sites without organ or systems involvement: Secondary | ICD-10-CM | POA: Diagnosis not present

## 2018-02-22 DIAGNOSIS — M79671 Pain in right foot: Secondary | ICD-10-CM | POA: Diagnosis not present

## 2018-02-22 DIAGNOSIS — M25511 Pain in right shoulder: Secondary | ICD-10-CM | POA: Diagnosis not present

## 2018-03-15 ENCOUNTER — Ambulatory Visit: Payer: 59 | Admitting: Psychology

## 2018-03-15 DIAGNOSIS — F410 Panic disorder [episodic paroxysmal anxiety] without agoraphobia: Secondary | ICD-10-CM

## 2018-03-22 DIAGNOSIS — R1310 Dysphagia, unspecified: Secondary | ICD-10-CM | POA: Diagnosis not present

## 2018-03-22 DIAGNOSIS — K449 Diaphragmatic hernia without obstruction or gangrene: Secondary | ICD-10-CM | POA: Diagnosis not present

## 2018-03-22 DIAGNOSIS — K219 Gastro-esophageal reflux disease without esophagitis: Secondary | ICD-10-CM | POA: Diagnosis not present

## 2018-03-23 ENCOUNTER — Ambulatory Visit (INDEPENDENT_AMBULATORY_CARE_PROVIDER_SITE_OTHER): Payer: 59

## 2018-03-23 DIAGNOSIS — Z3042 Encounter for surveillance of injectable contraceptive: Secondary | ICD-10-CM

## 2018-03-23 MED ORDER — MEDROXYPROGESTERONE ACETATE 150 MG/ML IM SUSP
150.0000 mg | Freq: Once | INTRAMUSCULAR | Status: AC
  Administered 2018-03-23: 150 mg via INTRAMUSCULAR

## 2018-03-23 NOTE — Progress Notes (Addendum)
Patient given depo-provera in the left ventrogluteal area. Patient tolerated well. Patient is to return between Dec10-24 for next injection  Reviewed.  Dr Nicki Reaper

## 2018-03-29 ENCOUNTER — Ambulatory Visit: Payer: 59 | Admitting: Psychology

## 2018-03-29 DIAGNOSIS — F410 Panic disorder [episodic paroxysmal anxiety] without agoraphobia: Secondary | ICD-10-CM

## 2018-04-05 ENCOUNTER — Ambulatory Visit: Payer: 59 | Admitting: Psychology

## 2018-04-05 DIAGNOSIS — M25511 Pain in right shoulder: Secondary | ICD-10-CM | POA: Diagnosis not present

## 2018-04-05 DIAGNOSIS — M0579 Rheumatoid arthritis with rheumatoid factor of multiple sites without organ or systems involvement: Secondary | ICD-10-CM | POA: Diagnosis not present

## 2018-04-05 DIAGNOSIS — R700 Elevated erythrocyte sedimentation rate: Secondary | ICD-10-CM | POA: Diagnosis not present

## 2018-04-12 ENCOUNTER — Ambulatory Visit: Payer: 59 | Admitting: Psychology

## 2018-04-12 DIAGNOSIS — F410 Panic disorder [episodic paroxysmal anxiety] without agoraphobia: Secondary | ICD-10-CM | POA: Diagnosis not present

## 2018-04-19 ENCOUNTER — Encounter: Payer: 59 | Admitting: Internal Medicine

## 2018-04-19 ENCOUNTER — Telehealth: Payer: Self-pay | Admitting: Internal Medicine

## 2018-04-19 NOTE — Telephone Encounter (Signed)
Lm to reschedule appt

## 2018-04-26 ENCOUNTER — Ambulatory Visit: Payer: 59 | Admitting: Psychology

## 2018-04-26 DIAGNOSIS — F410 Panic disorder [episodic paroxysmal anxiety] without agoraphobia: Secondary | ICD-10-CM

## 2018-05-03 ENCOUNTER — Ambulatory Visit: Payer: 59 | Admitting: Psychology

## 2018-05-05 ENCOUNTER — Ambulatory Visit (INDEPENDENT_AMBULATORY_CARE_PROVIDER_SITE_OTHER): Payer: 59 | Admitting: Internal Medicine

## 2018-05-05 ENCOUNTER — Other Ambulatory Visit (HOSPITAL_COMMUNITY)
Admission: RE | Admit: 2018-05-05 | Discharge: 2018-05-05 | Disposition: A | Discharge status: Home / Self Care | Payer: 59 | Source: Ambulatory Visit | Attending: Internal Medicine | Admitting: Internal Medicine

## 2018-05-05 ENCOUNTER — Encounter: Payer: Self-pay | Admitting: Internal Medicine

## 2018-05-05 VITALS — BP 124/70 | HR 110 | Resp 18 | Ht 61.0 in | Wt 170.0 lb

## 2018-05-05 DIAGNOSIS — Z9109 Other allergy status, other than to drugs and biological substances: Secondary | ICD-10-CM

## 2018-05-05 DIAGNOSIS — Z124 Encounter for screening for malignant neoplasm of cervix: Secondary | ICD-10-CM

## 2018-05-05 DIAGNOSIS — Z0000 Encounter for general adult medical examination without abnormal findings: Secondary | ICD-10-CM

## 2018-05-05 DIAGNOSIS — R1310 Dysphagia, unspecified: Secondary | ICD-10-CM

## 2018-05-05 DIAGNOSIS — Z6832 Body mass index (BMI) 32.0-32.9, adult: Secondary | ICD-10-CM | POA: Diagnosis not present

## 2018-05-05 DIAGNOSIS — D329 Benign neoplasm of meninges, unspecified: Secondary | ICD-10-CM

## 2018-05-05 DIAGNOSIS — E559 Vitamin D deficiency, unspecified: Secondary | ICD-10-CM

## 2018-05-05 DIAGNOSIS — I10 Essential (primary) hypertension: Secondary | ICD-10-CM

## 2018-05-05 DIAGNOSIS — E7800 Pure hypercholesterolemia, unspecified: Secondary | ICD-10-CM

## 2018-05-05 DIAGNOSIS — M059 Rheumatoid arthritis with rheumatoid factor, unspecified: Secondary | ICD-10-CM

## 2018-05-05 DIAGNOSIS — D649 Anemia, unspecified: Secondary | ICD-10-CM

## 2018-05-05 DIAGNOSIS — Z23 Encounter for immunization: Secondary | ICD-10-CM | POA: Diagnosis not present

## 2018-05-05 DIAGNOSIS — E039 Hypothyroidism, unspecified: Secondary | ICD-10-CM

## 2018-05-05 NOTE — Assessment & Plan Note (Signed)
Physical today 05/05/18.  Mammogram 02/15/18 - briads I.  Colonoscopy 03/07/13 - rectal polyp.  Scheduled for f/u colonoscopy.

## 2018-05-05 NOTE — Progress Notes (Signed)
Patient ID: Tina Parker, female   DOB: December 01, 1970, 47 y.o.   MRN: [id number]   Subjective:    Patient ID: Tina Parker, female    DOB: 01-28-1971, 47 y.o.   MRN: [id number]  HPI  Patient here for her physical exam.  States she is doing relatively well.  Increased stress.  Seeing a Social worker.  Discussed with her today.  She does not feel she needs anything more at this time.  Trying to stay active.  No chest pain.  No sob.  Previous URI.  Resolved.  Still some issues with swallowing.  Plans to f/u with GI.  No abdominal pain.  Taking dulcolax prn.  Off amlodipine.  On HCTZ now.  Swelling better.     Past Medical History:  Diagnosis Date  . Allergic rhinitis   . Anemia   . Arthritis    RA  . Carpal tunnel syndrome   . Dysphagia   . GERD (gastroesophageal reflux disease)   . Graves disease    remission, no ablation, positive medical treatment  . History of hiatal hernia   . Hypertension   . Migraine headache   . PPD positive    hepatitis secondary to [place]  . Pure hypercholesterolemia   . Rheumatoid arthritis(714.0)    positive anti CCP antibodies, positive RF, oligo-articular, MTX   Past Surgical History:  Procedure Laterality Date  . CARPAL TUNNEL RELEASE Right   . CESAREAN SECTION  1996  . CRANIOTOMY Left 06/25/2017   Procedure: CRANIOTOMY TEMPORAL LEFT FOR TUMOR RESECTION;  Surgeon: Ashok Pall, MD;  Location: [facility];  Service: Neurosurgery;  Laterality: Left;  . Post Septoplasty and turbinate reduction  2007  . TRIGGER FINGER RELEASE     Family History  Problem Relation Age of Onset  . Cancer Mother        Breast Cancer  . Breast cancer Mother 80  . Cancer Father        Colon Cancer  . Heart disease Father        Hx of MI  . Hypertension Father   . Diabetes Father   . Cancer Maternal Grandmother        lung cancer  . Cancer Maternal Uncle        esophageal   Social History   Socioeconomic History  . Marital status: Married    Spouse name: Not on file   . Number of children: Not on file  . Years of education: Not on file  . Highest education level: Not on file  Occupational History  . Not on file  Social Needs  . Financial resource strain: Not on file  . Food insecurity:    Worry: Not on file    Inability: Not on file  . Transportation needs:    Medical: Not on file    Non-medical: Not on file  Tobacco Use  . Smoking status: Never Smoker  . Smokeless tobacco: Never Used  Substance and Sexual Activity  . Alcohol use: Yes    Alcohol/week: 0.0 standard drinks    Comment: occasionally  . Drug use: No  . Sexual activity: Not on file  Lifestyle  . Physical activity:    Days per week: Not on file    Minutes per session: Not on file  . Stress: Not on file  Relationships  . Social connections:    Talks on phone: Not on file    Gets together: Not on file    Attends religious service: Not  on file    Active member of club or organization: Not on file    Attends meetings of clubs or organizations: Not on file    Relationship status: Not on file  Other Topics Concern  . Not on file  Social History Narrative   [employer]     Outpatient Encounter Medications as of 05/05/2018  Medication Sig  . albuterol (PROVENTIL HFA;VENTOLIN HFA) 108 (90 BASE) MCG/ACT inhaler Inhale 2 puffs into the lungs every 6 (six) hours as needed for wheezing or shortness of breath.  . Dexlansoprazole (DEXILANT) 30 MG capsule Take 30 mg by mouth daily.  . hydrochlorothiazide (HYDRODIURIL) 12.5 MG tablet Take 1 tablet (12.5 mg total) by mouth daily.  Marland Kitchen ibuprofen (ADVIL,MOTRIN) 800 MG tablet Take 800 mg by mouth 3 (three) times daily as needed. TID PRN for pain  . levocetirizine (XYZAL) 5 MG tablet Take 5 mg by mouth at bedtime.  Marland Kitchen losartan (COZAAR) 50 MG tablet Take 1 tablet (50 mg total) by mouth daily.  . medroxyPROGESTERone (DEPO-PROVERA) 150 MG/ML injection Inject 150 mg into the muscle every 3 (three) months.   . methotrexate (RHEUMATREX) 2.5 MG tablet  Take 22.5 mg by mouth every Monday. Caution:Chemotherapy. Protect from light. Take 9 tabs po weekly with meals  . montelukast (SINGULAIR) 10 MG tablet take 1 tablet by mouth at bedtime  . OLUMIANT 2 MG TABS   . ranitidine (ZANTAC) 150 MG tablet Take 150 mg by mouth 2 (two) times daily as needed (for breakthrough acid reflux/indigestion.).   Marland Kitchen valACYclovir (VALTREX) 1000 MG tablet Take 1 tablet (1,000 mg total) by mouth 2 (two) times daily.  . [DISCONTINUED] ORENCIA 125 MG/ML SOSY Inject 125 mg into the muscle every Friday.    No facility-administered encounter medications on file as of 05/05/2018.     Review of Systems  Constitutional: Negative for appetite change and unexpected weight change.  HENT: Negative for congestion and sinus pressure.   Eyes: Negative for pain and visual disturbance.  Respiratory: Negative for cough, chest tightness and shortness of breath.   Cardiovascular: Negative for chest pain, palpitations and leg swelling.  Gastrointestinal: Negative for abdominal pain, diarrhea, nausea and vomiting.       Swallowing issues as outlined.    Genitourinary: Negative for difficulty urinating and dysuria.  Musculoskeletal: Negative for myalgias.       Recent joint flares.  Seeing rheumatology.    Skin: Negative for color change and rash.  Neurological: Negative for dizziness, light-headedness and headaches.  Hematological: Negative for adenopathy. Does not bruise/bleed easily.  Psychiatric/Behavioral: Negative for agitation and dysphoric mood.       Objective:    Physical Exam  Constitutional: She is oriented to person, place, and time. She appears well-developed and well-nourished. No distress.  HENT:  Nose: Nose normal.  Mouth/Throat: Oropharynx is clear and moist.  Eyes: Right eye exhibits no discharge. Left eye exhibits no discharge. No scleral icterus.  Neck: Neck supple. No thyromegaly present.  Cardiovascular: Normal rate and regular rhythm.  Pulmonary/Chest:  Breath sounds normal. No accessory muscle usage. No tachypnea. No respiratory distress. She has no decreased breath sounds. She has no wheezes. She has no rhonchi. Right breast exhibits no inverted nipple, no mass, no nipple discharge and no tenderness (no axillary adenopathy). Left breast exhibits no inverted nipple, no mass, no nipple discharge and no tenderness (no axilarry adenopathy).  Abdominal: Soft. Bowel sounds are normal. There is no tenderness.  Genitourinary:  Genitourinary Comments: Normal external genitalia.  Vaginal  vault without lesions.  Cervix identified.  Pap smear performed.  Could not appreciate any adnexal masses or tenderness.    Musculoskeletal: She exhibits no edema or tenderness.  Lymphadenopathy:    She has no cervical adenopathy.  Neurological: She is alert and oriented to person, place, and time.  Skin: No rash noted. No erythema.  Psychiatric: She has a normal mood and affect. Her behavior is normal.    BP 124/70 (BP Location: Left Arm, Patient Position: Sitting, Cuff Size: Normal)   Pulse (!) 110   Resp 18   Ht 5\' 1"  (1.549 m)   Wt 170 lb (77.1 kg)   SpO2 98%   BMI 32.12 kg/m  Wt Readings from Last 3 Encounters:  05/05/18 170 lb (77.1 kg)  12/21/17 168 lb 9.6 oz (76.5 kg)  09/07/17 172 lb 9.6 oz (78.3 kg)     Lab Results  Component Value Date   WBC 6.5 06/22/2017   HGB 12.6 06/22/2017   HCT 37.9 06/22/2017   PLT 322 06/22/2017   GLUCOSE 90 10/19/2017   CHOL 227 (H) 10/19/2017   TRIG 79.0 10/19/2017   HDL 61.00 10/19/2017   LDLCALC 150 (H) 10/19/2017   ALT 18 10/19/2017   AST 18 10/19/2017   NA 139 10/19/2017   K 4.2 10/19/2017   CL 105 10/19/2017   CREATININE 0.99 10/19/2017   BUN 11 10/19/2017   CO2 22 10/19/2017   TSH 2.76 12/21/2017    Mm 3d Screen Breast Bilateral  Result Date: 02/15/2018 CLINICAL DATA:  Screening. EXAM: DIGITAL SCREENING BILATERAL MAMMOGRAM WITH TOMO AND CAD COMPARISON:  Previous exam(s). ACR Breast Density  Category c: The breast tissue is heterogeneously dense, which may obscure small masses. FINDINGS: There are no findings suspicious for malignancy. Images were processed with CAD. IMPRESSION: No mammographic evidence of malignancy. A result letter of this screening mammogram will be mailed directly to the patient. RECOMMENDATION: Screening mammogram in one year. (Code:SM-B-01Y) BI-RADS CATEGORY  1: Negative. Electronically Signed   By: Nolon Nations M.D.   On: 02/15/2018 12:06       Assessment & Plan:   Problem List Items Addressed This Visit    Anemia    Follow cbc.        BMI 32.0-32.9,adult    Discussed diet and exercise.  Follow.        Dysphagia    On dexilant.  Seeing GI. Plans f/u with GI.        Environmental allergies    Stable on current regimen.        Essential hypertension, benign    Blood pressure under good control.  Continue same medication regimen.  Amlodipine recently stopped.  On HCTZ now.  Follow pressures.  Follow metabolic panel.        Relevant Orders   Basic metabolic panel   Health care maintenance    Physical today 05/05/18.  Mammogram 02/15/18 - briads I.  Colonoscopy 03/07/13 - rectal polyp.  Scheduled for f/u colonoscopy.        Hypercholesterolemia    Low cholesterol diet and exercise.  Follow lipid panel.        Relevant Orders   Hepatic function panel   Lipid panel   Hypothyroidism    On thyroid replacement.  Follow tsh.       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  Followed by neurosurgery.        Rheumatoid arthritis ([place])  Followed by Dr Jefm Bryant.        Relevant Medications   OLUMIANT 2 MG TABS   Vitamin D deficiency    Follow vitamin D level.         Other Visit Diagnoses    Routine general medical examination at a health care facility    -  Primary   Cervical cancer screening       Relevant Orders   Cytology - PAP( [place])   Need for immunization against influenza        Relevant Orders   Flu Vaccine QUAD 36+ mos IM (Completed)       Einar Pheasant, MD

## 2018-05-07 LAB — CYTOLOGY - PAP
Diagnosis: NEGATIVE
HPV: NOT DETECTED

## 2018-05-08 ENCOUNTER — Encounter: Payer: Self-pay | Admitting: Internal Medicine

## 2018-05-08 NOTE — Assessment & Plan Note (Signed)
Low cholesterol diet and exercise.  Follow lipid panel.   

## 2018-05-08 NOTE — Assessment & Plan Note (Signed)
Follow vitamin D level.  

## 2018-05-08 NOTE — Assessment & Plan Note (Addendum)
Blood pressure under good control.  Continue same medication regimen.  Amlodipine recently stopped.  On HCTZ now.  Follow pressures.  Follow metabolic panel.

## 2018-05-08 NOTE — Assessment & Plan Note (Signed)
On dexilant.  Seeing GI. Plans f/u with GI.

## 2018-05-08 NOTE — Assessment & Plan Note (Signed)
Followed by Dr Kernodle.   

## 2018-05-08 NOTE — Assessment & Plan Note (Signed)
Stable on current regimen   

## 2018-05-08 NOTE — Assessment & Plan Note (Signed)
On thyroid replacement.  Follow tsh.  

## 2018-05-08 NOTE — Assessment & Plan Note (Signed)
Follow cbc.  

## 2018-05-08 NOTE — Assessment & Plan Note (Signed)
Discussed diet and exercise.  Follow.  

## 2018-05-08 NOTE — Assessment & Plan Note (Signed)
S/p removal.  Followed by neurosurgery.

## 2018-05-10 ENCOUNTER — Encounter: Payer: Self-pay | Admitting: Internal Medicine

## 2018-05-10 ENCOUNTER — Ambulatory Visit (INDEPENDENT_AMBULATORY_CARE_PROVIDER_SITE_OTHER): Payer: 59 | Admitting: Psychology

## 2018-05-10 DIAGNOSIS — F410 Panic disorder [episodic paroxysmal anxiety] without agoraphobia: Secondary | ICD-10-CM | POA: Diagnosis not present

## 2018-05-12 ENCOUNTER — Telehealth: Payer: Self-pay | Admitting: Internal Medicine

## 2018-05-12 NOTE — Telephone Encounter (Signed)
Please advise 

## 2018-05-12 NOTE — Telephone Encounter (Signed)
Copied from [place] [id number]. Topic: General - Other >> May 12, 2018  2:33 PM Lennox Solders wrote: Reason for CRM:pt is calling and would like sleep study results. Pt saw dr Nicki Reaper on 05-05-18

## 2018-05-13 NOTE — Telephone Encounter (Signed)
Pt aware of results and sleep study sent to scan

## 2018-05-17 ENCOUNTER — Ambulatory Visit (INDEPENDENT_AMBULATORY_CARE_PROVIDER_SITE_OTHER): Payer: 59 | Admitting: Psychology

## 2018-05-17 ENCOUNTER — Other Ambulatory Visit (INDEPENDENT_AMBULATORY_CARE_PROVIDER_SITE_OTHER): Payer: 59

## 2018-05-17 DIAGNOSIS — I10 Essential (primary) hypertension: Secondary | ICD-10-CM

## 2018-05-17 DIAGNOSIS — F410 Panic disorder [episodic paroxysmal anxiety] without agoraphobia: Secondary | ICD-10-CM | POA: Diagnosis not present

## 2018-05-17 DIAGNOSIS — E7800 Pure hypercholesterolemia, unspecified: Secondary | ICD-10-CM

## 2018-05-17 LAB — HEPATIC FUNCTION PANEL
ALT: 21 U/L (ref 0–35)
AST: 21 U/L (ref 0–37)
Albumin: 4.3 g/dL (ref 3.5–5.2)
Alkaline Phosphatase: 91 U/L (ref 39–117)
Bilirubin, Direct: 0.1 mg/dL (ref 0.0–0.3)
Total Bilirubin: 0.5 mg/dL (ref 0.2–1.2)
Total Protein: 7.4 g/dL (ref 6.0–8.3)

## 2018-05-17 LAB — BASIC METABOLIC PANEL
BUN: 13 mg/dL (ref 6–23)
CO2: 27 mEq/L (ref 19–32)
Calcium: 9.9 mg/dL (ref 8.4–10.5)
Chloride: 105 mEq/L (ref 96–112)
Creatinine, Ser: 1.01 mg/dL (ref 0.40–1.20)
GFR: 74.87 mL/min (ref >60.00)
Glucose, Bld: 94 mg/dL (ref 70–99)
Potassium: 4.4 mEq/L (ref 3.5–5.1)
Sodium: 139 mEq/L (ref 135–145)

## 2018-05-17 LAB — LIPID PANEL
Cholesterol: 231 mg/dL — ABNORMAL HIGH (ref 0–200)
HDL: 56.7 mg/dL (ref >39.00)
LDL Cholesterol: 159 mg/dL — ABNORMAL HIGH (ref 0–99)
NonHDL: 174.06
Total CHOL/HDL Ratio: 4
Triglycerides: 77 mg/dL (ref 0.0–149.0)
VLDL: 15.4 mg/dL (ref 0.0–40.0)

## 2018-05-18 ENCOUNTER — Encounter: Payer: Self-pay | Admitting: Internal Medicine

## 2018-05-24 ENCOUNTER — Ambulatory Visit: Payer: 59 | Admitting: Psychology

## 2018-05-25 ENCOUNTER — Encounter: Payer: Self-pay | Admitting: Internal Medicine

## 2018-05-26 NOTE — Telephone Encounter (Signed)
Pt unable to come in today but has been scheduled for 12/9 at 8:30

## 2018-05-26 NOTE — Telephone Encounter (Signed)
See if she can come in now

## 2018-05-26 NOTE — Telephone Encounter (Signed)
Pt was just seen on 11/6. Would you like for her to come in for a visit to discuss?

## 2018-06-07 ENCOUNTER — Ambulatory Visit: Payer: 59 | Admitting: Internal Medicine

## 2018-06-07 ENCOUNTER — Ambulatory Visit: Payer: 59 | Admitting: Psychology

## 2018-06-07 ENCOUNTER — Encounter: Payer: Self-pay | Admitting: Internal Medicine

## 2018-06-07 DIAGNOSIS — I10 Essential (primary) hypertension: Secondary | ICD-10-CM | POA: Diagnosis not present

## 2018-06-07 DIAGNOSIS — D649 Anemia, unspecified: Secondary | ICD-10-CM

## 2018-06-07 DIAGNOSIS — M059 Rheumatoid arthritis with rheumatoid factor, unspecified: Secondary | ICD-10-CM

## 2018-06-07 DIAGNOSIS — Z79899 Other long term (current) drug therapy: Secondary | ICD-10-CM | POA: Diagnosis not present

## 2018-06-07 DIAGNOSIS — K219 Gastro-esophageal reflux disease without esophagitis: Secondary | ICD-10-CM

## 2018-06-07 DIAGNOSIS — E039 Hypothyroidism, unspecified: Secondary | ICD-10-CM

## 2018-06-07 DIAGNOSIS — E7800 Pure hypercholesterolemia, unspecified: Secondary | ICD-10-CM | POA: Diagnosis not present

## 2018-06-07 DIAGNOSIS — R700 Elevated erythrocyte sedimentation rate: Secondary | ICD-10-CM | POA: Diagnosis not present

## 2018-06-07 DIAGNOSIS — F419 Anxiety disorder, unspecified: Secondary | ICD-10-CM

## 2018-06-07 DIAGNOSIS — M0579 Rheumatoid arthritis with rheumatoid factor of multiple sites without organ or systems involvement: Secondary | ICD-10-CM | POA: Diagnosis not present

## 2018-06-07 DIAGNOSIS — J329 Chronic sinusitis, unspecified: Secondary | ICD-10-CM

## 2018-06-07 DIAGNOSIS — J018 Other acute sinusitis: Secondary | ICD-10-CM | POA: Diagnosis not present

## 2018-06-07 MED ORDER — SERTRALINE HCL 50 MG PO TABS: 50.0000 mg | ORAL_TABLET | Freq: Every day | ORAL | 2 refills | Status: DC

## 2018-06-07 MED ORDER — CEFUROXIME AXETIL 250 MG PO TABS: 250.0000 mg | ORAL_TABLET | Freq: Two times a day (BID) | ORAL | 0 refills | Status: DC

## 2018-06-07 NOTE — Patient Instructions (Signed)
Take a probiotic daily while you are on the antibiotic and for two weeks after completing antibiotics.    Examples of probiotics:  Culturelle, align or florastor  Take zoloft 1/2 tablet per day for one week and then one per day.

## 2018-06-07 NOTE — Progress Notes (Signed)
Patient ID: Tina Parker, female   DOB: 1971/01/26, 47 y.o.   MRN: [id number]   Subjective:    Patient ID: Tina Parker, female    DOB: 20-Mar-1971, 47 y.o.   MRN: [id number]  HPI  Patient here as a work in with concerns regarding increased stress and anxiety. She has been having issues for a while.  Has been reluctant to take anything.  States she is ready now and feels she needs something to help level things out. Having more anxiety episodes.  Seeing a Social worker.  This has been going well.  Plans to continue.  Some mild depression.  No suicidal ideations.  Discussed treatment options.  She also reports increased nasal congestion and sinus pressure.  Using nasal spray and mucinex.  Also taking benadryl, etc.  Still with persistent symptoms.  Discussed exercise.  She is planning to restart boot camp in January.  No chest pain.  No sob.  No acid reflux. No abdominal pain.   Past Medical History:  Diagnosis Date  . Allergic rhinitis   . Anemia   . Arthritis    RA  . Carpal tunnel syndrome   . Dysphagia   . GERD (gastroesophageal reflux disease)   . Graves disease    remission, no ablation, positive medical treatment  . History of hiatal hernia   . Hypertension   . Migraine headache   . PPD positive    hepatitis secondary to [place]  . Pure hypercholesterolemia   . Rheumatoid arthritis(714.0)    positive anti CCP antibodies, positive RF, oligo-articular, MTX   Past Surgical History:  Procedure Laterality Date  . CARPAL TUNNEL RELEASE Right   . CESAREAN SECTION  1996  . CRANIOTOMY Left 06/25/2017   Procedure: CRANIOTOMY TEMPORAL LEFT FOR TUMOR RESECTION;  Surgeon: Ashok Pall, MD;  Location: [place];  Service: Neurosurgery;  Laterality: Left;  . Post Septoplasty and turbinate reduction  2007  . TRIGGER FINGER RELEASE     Family History  Problem Relation Age of Onset  . Cancer Mother        Breast Cancer  . Breast cancer Mother 108  . Cancer Father        Colon Cancer  .  Heart disease Father        Hx of MI  . Hypertension Father   . Diabetes Father   . Cancer Maternal Grandmother        lung cancer  . Cancer Maternal Uncle        esophageal   Social History   Socioeconomic History  . Marital status: Married    Spouse name: Not on file  . Number of children: Not on file  . Years of education: Not on file  . Highest education level: Not on file  Occupational History  . Not on file  Social Needs  . Financial resource strain: Not on file  . Food insecurity:    Worry: Not on file    Inability: Not on file  . Transportation needs:    Medical: Not on file    Non-medical: Not on file  Tobacco Use  . Smoking status: Never Smoker  . Smokeless tobacco: Never Used  Substance and Sexual Activity  . Alcohol use: Yes    Alcohol/week: 0.0 standard drinks    Comment: occasionally  . Drug use: No  . Sexual activity: Not on file  Lifestyle  . Physical activity:    Days per week: Not on file    Minutes  per session: Not on file  . Stress: Not on file  Relationships  . Social connections:    Talks on phone: Not on file    Gets together: Not on file    Attends religious service: Not on file    Active member of club or organization: Not on file    Attends meetings of clubs or organizations: Not on file    Relationship status: Not on file  Other Topics Concern  . Not on file  Social History Narrative   Hairdresser     Outpatient Encounter Medications as of 06/07/2018  Medication Sig  . albuterol (PROVENTIL HFA;VENTOLIN HFA) 108 (90 BASE) MCG/ACT inhaler Inhale 2 puffs into the lungs every 6 (six) hours as needed for wheezing or shortness of breath.  . cefUROXime (CEFTIN) 250 MG tablet Take 1 tablet (250 mg total) by mouth 2 (two) times daily with a meal.  . Dexlansoprazole (DEXILANT) 30 MG capsule Take 30 mg by mouth daily.  . hydrochlorothiazide (HYDRODIURIL) 12.5 MG tablet Take 1 tablet (12.5 mg total) by mouth daily.  Marland Kitchen ibuprofen (ADVIL,MOTRIN)  800 MG tablet Take 800 mg by mouth 3 (three) times daily as needed. TID PRN for pain  . levocetirizine (XYZAL) 5 MG tablet Take 5 mg by mouth at bedtime.  Marland Kitchen losartan (COZAAR) 50 MG tablet Take 1 tablet (50 mg total) by mouth daily.  . medroxyPROGESTERone (DEPO-PROVERA) 150 MG/ML injection Inject 150 mg into the muscle every 3 (three) months.   . methotrexate (RHEUMATREX) 2.5 MG tablet Take 22.5 mg by mouth every Monday. Caution:Chemotherapy. Protect from light. Take 9 tabs po weekly with meals  . montelukast (SINGULAIR) 10 MG tablet take 1 tablet by mouth at bedtime  . OLUMIANT 2 MG TABS   . sertraline (ZOLOFT) 50 MG tablet Take 1 tablet (50 mg total) by mouth daily.  . valACYclovir (VALTREX) 1000 MG tablet Take 1 tablet (1,000 mg total) by mouth 2 (two) times daily.  . [DISCONTINUED] ranitidine (ZANTAC) 150 MG tablet Take 150 mg by mouth 2 (two) times daily as needed (for breakthrough acid reflux/indigestion.).    No facility-administered encounter medications on file as of 06/07/2018.     Review of Systems  Constitutional: Negative for appetite change and unexpected weight change.  HENT: Positive for congestion, postnasal drip and sinus pressure.        Irritated throat.    Respiratory: Negative for cough, chest tightness and shortness of breath.   Cardiovascular: Negative for chest pain, palpitations and leg swelling.  Gastrointestinal: Positive for constipation. Negative for abdominal pain, diarrhea, nausea and vomiting.       Takes dulcolax.    Genitourinary: Negative for difficulty urinating and dysuria.  Musculoskeletal: Negative for joint swelling and myalgias.  Skin: Negative for color change and rash.  Neurological: Negative for dizziness, light-headedness and headaches.  Psychiatric/Behavioral: Negative for agitation and dysphoric mood.       Increased stress as outlined.  Increased anxiety.         Objective:    Physical Exam  Constitutional: She appears well-developed  and well-nourished. No distress.  HENT:  Mouth/Throat: Oropharynx is clear and moist.  Nares:  Erythematous turbinates.  Minimal tenderness to palpation over the sinuses.    Neck: Neck supple.  Cardiovascular: Normal rate and regular rhythm.  Pulmonary/Chest: Breath sounds normal. No respiratory distress. She has no wheezes.  Abdominal: Soft. Bowel sounds are normal. There is no tenderness.  Musculoskeletal: She exhibits no edema or tenderness.  Lymphadenopathy:  She has no cervical adenopathy.  Skin: No rash noted. No erythema.  Psychiatric: She has a normal mood and affect. Her behavior is normal.    BP 128/78 (BP Location: Left Arm, Patient Position: Sitting, Cuff Size: Normal)   Pulse 95   Temp 99 F (37.2 C) (Oral)   Resp 18   Wt 176 lb (79.8 kg)   SpO2 97%   BMI 33.25 kg/m  Wt Readings from Last 3 Encounters:  06/07/18 176 lb (79.8 kg)  05/05/18 170 lb (77.1 kg)  12/21/17 168 lb 9.6 oz (76.5 kg)     Lab Results  Component Value Date   WBC 6.5 06/22/2017   HGB 12.6 06/22/2017   HCT 37.9 06/22/2017   PLT 322 06/22/2017   GLUCOSE 94 05/17/2018   CHOL 231 (H) 05/17/2018   TRIG 77.0 05/17/2018   HDL 56.70 05/17/2018   LDLCALC 159 (H) 05/17/2018   ALT 21 05/17/2018   AST 21 05/17/2018   NA 139 05/17/2018   K 4.4 05/17/2018   CL 105 05/17/2018   CREATININE 1.01 05/17/2018   BUN 13 05/17/2018   CO2 27 05/17/2018   TSH 2.76 12/21/2017       Assessment & Plan:   Problem List Items Addressed This Visit    Anemia    Follow cbc.       Anxiety    Increased stress and anxiety.  Mild depression.  No suicidal ideations.  Discussed with her today.  Will start zoloft as directed.  Follow.  Get her back in soon to reassess. Continue seeing her counselor.        Relevant Medications   sertraline (ZOLOFT) 50 MG tablet   Essential hypertension, benign    Blood pressure under good control.  Continue same medication regimen.  Follow pressures.  Follow metabolic  panel.        GERD (gastroesophageal reflux disease)    Controlled on current regimen.        Hypercholesterolemia    Low cholesterol diet and exercise.  Follow lipid panel.        Hypothyroidism    On thyroid replacement.  Follow tsh.       Rheumatoid arthritis ([place])    Followed by Dr Jefm Bryant.  On Olumiant now.  Follow.        Sinusitis    Persistent increased congestion.  Concern regarding sinusitis.  Continue her nasal sprays and mucinex.  Continue singulair.  Treat with ceftin as directed.  Probiotic as directed.  Follow. Notify me if symptoms worsen or do not resolve.        Relevant Medications   cefUROXime (CEFTIN) 250 MG tablet       Einar Pheasant, MD

## 2018-06-08 ENCOUNTER — Encounter: Payer: Self-pay | Admitting: Internal Medicine

## 2018-06-08 ENCOUNTER — Ambulatory Visit: Payer: 59

## 2018-06-08 DIAGNOSIS — F419 Anxiety disorder, unspecified: Secondary | ICD-10-CM | POA: Insufficient documentation

## 2018-06-08 DIAGNOSIS — J329 Chronic sinusitis, unspecified: Secondary | ICD-10-CM | POA: Insufficient documentation

## 2018-06-08 NOTE — Assessment & Plan Note (Signed)
Controlled on current regimen.   

## 2018-06-08 NOTE — Assessment & Plan Note (Signed)
Followed by Dr Jefm Bryant.  On Olumiant now.  Follow.

## 2018-06-08 NOTE — Assessment & Plan Note (Signed)
Persistent increased congestion.  Concern regarding sinusitis.  Continue her nasal sprays and mucinex.  Continue singulair.  Treat with ceftin as directed.  Probiotic as directed.  Follow. Notify me if symptoms worsen or do not resolve.

## 2018-06-08 NOTE — Assessment & Plan Note (Signed)
Follow cbc.  

## 2018-06-08 NOTE — Assessment & Plan Note (Signed)
Increased stress and anxiety.  Mild depression.  No suicidal ideations.  Discussed with her today.  Will start zoloft as directed.  Follow.  Get her back in soon to reassess. Continue seeing her counselor.

## 2018-06-08 NOTE — Assessment & Plan Note (Signed)
Low cholesterol diet and exercise.  Follow lipid panel.   

## 2018-06-08 NOTE — Assessment & Plan Note (Signed)
On thyroid replacement.  Follow tsh.  

## 2018-06-08 NOTE — Assessment & Plan Note (Signed)
Blood pressure under good control.  Continue same medication regimen.  Follow pressures.  Follow metabolic panel.   

## 2018-06-14 ENCOUNTER — Ambulatory Visit (INDEPENDENT_AMBULATORY_CARE_PROVIDER_SITE_OTHER): Payer: 59 | Admitting: *Deleted

## 2018-06-14 DIAGNOSIS — Z3042 Encounter for surveillance of injectable contraceptive: Secondary | ICD-10-CM | POA: Diagnosis not present

## 2018-06-14 MED ORDER — MEDROXYPROGESTERONE ACETATE 150 MG/ML IM SUSP
150.0000 mg | Freq: Once | INTRAMUSCULAR | Status: AC
  Administered 2018-06-14: 150 mg via INTRAMUSCULAR

## 2018-06-14 NOTE — Progress Notes (Signed)
Patient presented for Depo-provera with in the window for injection. Injection given in left upper outter quadrant.Patient voiced no concerns during or after injection.

## 2018-06-21 ENCOUNTER — Encounter: Admission: RE | Payer: Self-pay | Source: Ambulatory Visit

## 2018-06-21 ENCOUNTER — Ambulatory Visit
Admission: RE | Admit: 2018-06-21 | Discharge: 2018-06-21 | Disposition: A | Discharge status: Home / Self Care | Payer: 59 | Attending: Unknown Physician Specialty | Admitting: Unknown Physician Specialty

## 2018-06-21 ENCOUNTER — Encounter: Payer: Self-pay | Admitting: Anesthesiology

## 2018-06-21 ENCOUNTER — Ambulatory Visit: Payer: 59 | Admitting: Anesthesiology

## 2018-06-21 ENCOUNTER — Ambulatory Visit: Admission: RE | Admit: 2018-06-21 | Payer: 59 | Source: Ambulatory Visit | Admitting: Unknown Physician Specialty

## 2018-06-21 ENCOUNTER — Encounter
Admission: RE | Disposition: A | Discharge status: Home / Self Care | Payer: Self-pay | Source: Home / Self Care | Attending: Unknown Physician Specialty

## 2018-06-21 DIAGNOSIS — I10 Essential (primary) hypertension: Secondary | ICD-10-CM | POA: Diagnosis not present

## 2018-06-21 DIAGNOSIS — K2970 Gastritis, unspecified, without bleeding: Secondary | ICD-10-CM | POA: Insufficient documentation

## 2018-06-21 DIAGNOSIS — K640 First degree hemorrhoids: Secondary | ICD-10-CM | POA: Insufficient documentation

## 2018-06-21 DIAGNOSIS — F419 Anxiety disorder, unspecified: Secondary | ICD-10-CM | POA: Diagnosis not present

## 2018-06-21 DIAGNOSIS — Z800 Family history of malignant neoplasm of digestive organs: Secondary | ICD-10-CM | POA: Diagnosis not present

## 2018-06-21 DIAGNOSIS — M069 Rheumatoid arthritis, unspecified: Secondary | ICD-10-CM | POA: Insufficient documentation

## 2018-06-21 DIAGNOSIS — Z79899 Other long term (current) drug therapy: Secondary | ICD-10-CM | POA: Diagnosis not present

## 2018-06-21 DIAGNOSIS — K222 Esophageal obstruction: Secondary | ICD-10-CM | POA: Insufficient documentation

## 2018-06-21 DIAGNOSIS — E7800 Pure hypercholesterolemia, unspecified: Secondary | ICD-10-CM | POA: Insufficient documentation

## 2018-06-21 DIAGNOSIS — K648 Other hemorrhoids: Secondary | ICD-10-CM | POA: Diagnosis not present

## 2018-06-21 DIAGNOSIS — Z1211 Encounter for screening for malignant neoplasm of colon: Secondary | ICD-10-CM | POA: Diagnosis not present

## 2018-06-21 DIAGNOSIS — K219 Gastro-esophageal reflux disease without esophagitis: Secondary | ICD-10-CM | POA: Diagnosis not present

## 2018-06-21 DIAGNOSIS — R1310 Dysphagia, unspecified: Secondary | ICD-10-CM | POA: Diagnosis present

## 2018-06-21 HISTORY — PX: ESOPHAGOGASTRODUODENOSCOPY (EGD) WITH PROPOFOL: SHX5813

## 2018-06-21 HISTORY — DX: Dyskinesia of esophagus: K22.4

## 2018-06-21 HISTORY — PX: COLONOSCOPY WITH PROPOFOL: SHX5780

## 2018-06-21 LAB — POCT PREGNANCY, URINE: Preg Test, Ur: NEGATIVE

## 2018-06-21 LAB — HM COLONOSCOPY

## 2018-06-21 SURGERY — COLONOSCOPY WITH PROPOFOL
Anesthesia: General

## 2018-06-21 SURGERY — EGD (ESOPHAGOGASTRODUODENOSCOPY)
Anesthesia: General

## 2018-06-21 MED ORDER — PROPOFOL 500 MG/50ML IV EMUL
INTRAVENOUS | Status: DC | PRN
  Administered 2018-06-21: 140 ug/kg/min via INTRAVENOUS

## 2018-06-21 MED ORDER — LIDOCAINE HCL (CARDIAC) PF 100 MG/5ML IV SOSY
PREFILLED_SYRINGE | INTRAVENOUS | Status: DC | PRN
  Administered 2018-06-21: 50 mg via INTRAVENOUS

## 2018-06-21 MED ORDER — SODIUM CHLORIDE 0.9 % IV SOLN
INTRAVENOUS | Status: DC
  Administered 2018-06-21: 13:00:00 via INTRAVENOUS

## 2018-06-21 MED ORDER — SODIUM CHLORIDE 0.9 % IV SOLN: INTRAVENOUS | Status: DC

## 2018-06-21 MED ORDER — LIDOCAINE HCL (PF) 2 % IJ SOLN
INTRAMUSCULAR | Status: AC
  Filled 2018-06-21: qty 10

## 2018-06-21 MED ORDER — PROPOFOL 500 MG/50ML IV EMUL
INTRAVENOUS | Status: AC
  Filled 2018-06-21: qty 50

## 2018-06-21 MED ORDER — PROPOFOL 10 MG/ML IV BOLUS
INTRAVENOUS | Status: DC | PRN
  Administered 2018-06-21: 90 mg via INTRAVENOUS
  Administered 2018-06-21: 23 mg via INTRAVENOUS

## 2018-06-21 NOTE — H&P (Signed)
Primary Care Physician:  Tina Pheasant, MD Primary Gastroenterologist:  Dr. Vira Agar  Pre-Procedure History & Physical: HPI:  Tina Parker is a 47 y.o. female is here for an endoscopy and colonoscopy.   Past Medical History:  Diagnosis Date  . Allergic rhinitis   . Anemia   . Arthritis    RA  . Carpal tunnel syndrome   . Dysphagia   . Esophageal spasm 01/16/2014  . GERD (gastroesophageal reflux disease)   . Graves disease    remission, no ablation, positive medical treatment  . History of hiatal hernia   . Hypertension   . Migraine headache    migraines  . PPD positive    hepatitis secondary to [place]  . Pure hypercholesterolemia   . Rheumatoid arthritis(714.0)    positive anti CCP antibodies, positive RF, oligo-articular, MTX    Past Surgical History:  Procedure Laterality Date  . CARPAL TUNNEL RELEASE Right   . CESAREAN SECTION  1996  . COLONOSCOPY  03/14/2002, 08/16/2007, 03/07/2013   [facility]-father  . CRANIOTOMY Left 06/25/2017   Procedure: CRANIOTOMY TEMPORAL LEFT FOR TUMOR RESECTION;  Surgeon: Ashok Pall, MD;  Location: [place];  Service: Neurosurgery;  Laterality: Left;  . ESOPHAGOGASTRODUODENOSCOPY  03/14/2002, 03/07/2013  . Post Septoplasty and turbinate reduction  2007  . TRIGGER FINGER RELEASE      Prior to Admission medications   Medication Sig Start Date End Date Taking? Authorizing Provider  albuterol (PROVENTIL HFA;VENTOLIN HFA) 108 (90 BASE) MCG/ACT inhaler Inhale 2 puffs into the lungs every 6 (six) hours as needed for wheezing or shortness of breath. 01/05/15  Yes Tina Pheasant, MD  Dexlansoprazole (DEXILANT) 30 MG capsule Take 30 mg by mouth daily.   Yes [provider]  hydrochlorothiazide (HYDRODIURIL) 12.5 MG tablet Take 1 tablet (12.5 mg total) by mouth daily. 12/21/17  Yes McLean-Scocuzza, Nino Glow, MD  ibuprofen (ADVIL,MOTRIN) 800 MG tablet Take 800 mg by mouth 3 (three) times daily as needed. TID PRN for pain   Yes [provider]   levocetirizine (XYZAL) 5 MG tablet Take 5 mg by mouth at bedtime.   Yes [provider]  losartan (COZAAR) 50 MG tablet Take 1 tablet (50 mg total) by mouth daily. 01/05/18  Yes Tina Pheasant, MD  medroxyPROGESTERone (DEPO-PROVERA) 150 MG/ML injection Inject 150 mg into the muscle every 3 (three) months.    Yes [provider]  methotrexate (RHEUMATREX) 2.5 MG tablet Take 22.5 mg by mouth every Monday. Caution:Chemotherapy. Protect from light. Take 9 tabs po weekly with meals   Yes [provider]  montelukast (SINGULAIR) 10 MG tablet take 1 tablet by mouth at bedtime 10/08/17  Yes Tina Pheasant, MD  OLUMIANT 2 MG TABS  04/22/18  Yes [provider]  sertraline (ZOLOFT) 50 MG tablet Take 1 tablet (50 mg total) by mouth daily. 06/07/18  Yes Tina Pheasant, MD  valACYclovir (VALTREX) 1000 MG tablet Take 1 tablet (1,000 mg total) by mouth 2 (two) times daily. 07/20/17  Yes Tina Pheasant, MD  cefUROXime (CEFTIN) 250 MG tablet Take 1 tablet (250 mg total) by mouth 2 (two) times daily with a meal. Patient not taking: Reported on 06/21/2018 06/07/18   Tina Pheasant, MD    Allergies as of 05/07/2018 - Review Complete 05/05/2018  Allergen Reaction Noted  . Inh [isoniazid] Other (See Comments) 07/30/2012    Family History  Problem Relation Age of Onset  . Cancer Mother        Breast Cancer  . Breast  cancer Mother 35  . Cancer Father        Colon Cancer  . Heart disease Father        Hx of MI  . Hypertension Father   . Diabetes Father   . Cancer Maternal Grandmother        lung cancer  . Cancer Maternal Uncle        esophageal    Social History   Socioeconomic History  . Marital status: Married    Spouse name: Not on file  . Number of children: Not on file  . Years of education: Not on file  . Highest education level: Not on file  Occupational History  . Not on file  Social Needs  . Financial resource strain: Not on file  . Food insecurity:     Worry: Not on file    Inability: Not on file  . Transportation needs:    Medical: Not on file    Non-medical: Not on file  Tobacco Use  . Smoking status: Never Smoker  . Smokeless tobacco: Never Used  Substance and Sexual Activity  . Alcohol use: Yes    Alcohol/week: 0.0 standard drinks    Comment: occasionally  . Drug use: No  . Sexual activity: Not on file  Lifestyle  . Physical activity:    Days per week: Not on file    Minutes per session: Not on file  . Stress: Not on file  Relationships  . Social connections:    Talks on phone: Not on file    Gets together: Not on file    Attends religious service: Not on file    Active member of club or organization: Not on file    Attends meetings of clubs or organizations: Not on file    Relationship status: Not on file  . Intimate partner violence:    Fear of current or ex partner: Not on file    Emotionally abused: Not on file    Physically abused: Not on file    Forced sexual activity: Not on file  Other Topics Concern  . Not on file  Social History Narrative   Hairdresser     Review of Systems: See HPI, otherwise negative ROS  Physical Exam: BP (!) 138/102   Pulse (!) 114   Temp (!) 96.4 F (35.8 C) (Tympanic)   Resp 20   Ht 5\' 1"  (1.549 m)   Wt 77.1 kg   SpO2 100%   BMI 32.12 kg/m  General:   Alert,  pleasant and cooperative in NAD Head:  Normocephalic and atraumatic. Neck:  Supple; no masses or thyromegaly. Lungs:  Clear throughout to auscultation.    Heart:  Regular rate and rhythm. Abdomen:  Soft, nontender and nondistended. Normal bowel sounds, without guarding, and without rebound.   Neurologic:  Alert and  oriented x4;  grossly normal neurologically.  Impression/Plan: [name] is here for an endoscopy and colonoscopy to be performed for dysphagia and FH colon cancer in her father.  Risks, benefits, limitations, and alternatives regarding  endoscopy and colonoscopy have been reviewed with  the patient.  Questions have been answered.  All parties agreeable.   [name], MD  06/21/2018, 12:59 PM

## 2018-06-21 NOTE — Anesthesia Preprocedure Evaluation (Signed)
Anesthesia Evaluation  Patient identified by MRN, date of birth, ID band Patient awake    Reviewed: Allergy & Precautions, NPO status , Patient's Chart, lab work & pertinent test results, reviewed documented beta blocker date and time   Airway Mallampati: III  TM Distance: >3 FB     Dental  (+) Chipped   Pulmonary           Cardiovascular hypertension, Pt. on medications      Neuro/Psych  Headaches, Anxiety  Neuromuscular disease    GI/Hepatic hiatal hernia, GERD  ,  Endo/Other  Hypothyroidism   Renal/GU      Musculoskeletal  (+) Arthritis , Rheumatoid disorders,    Abdominal   Peds  Hematology  (+) anemia ,   Anesthesia Other Findings   Reproductive/Obstetrics                             Anesthesia Physical Anesthesia Plan  ASA: III  Anesthesia Plan: General   Post-op Pain Management:    Induction: Intravenous  PONV Risk Score and Plan:   Airway Management Planned:   Additional Equipment:   Intra-op Plan:   Post-operative Plan:   Informed Consent: I have reviewed the patients History and Physical, chart, labs and discussed the procedure including the risks, benefits and alternatives for the proposed anesthesia with the patient or authorized representative who has indicated his/her understanding and acceptance.     Plan Discussed with: CRNA  Anesthesia Plan Comments:         Anesthesia Quick Evaluation

## 2018-06-21 NOTE — Anesthesia Post-op Follow-up Note (Signed)
Anesthesia QCDR form completed.        

## 2018-06-21 NOTE — Anesthesia Postprocedure Evaluation (Signed)
Anesthesia Post Note  Patient: Tina Parker  Procedure(s) Performed: COLONOSCOPY WITH PROPOFOL (N/A ) ESOPHAGOGASTRODUODENOSCOPY (EGD) WITH PROPOFOL (N/A )  Patient location during evaluation: Endoscopy Anesthesia Type: General Level of consciousness: awake and alert Pain management: pain level controlled Vital Signs Assessment: post-procedure vital signs reviewed and stable Respiratory status: spontaneous breathing, nonlabored ventilation, respiratory function stable and patient connected to nasal cannula oxygen Cardiovascular status: blood pressure returned to baseline and stable Postop Assessment: no apparent nausea or vomiting Anesthetic complications: no     Last Vitals:  Vitals:   06/21/18 1300 06/21/18 1338  BP:    Pulse:    Resp:    Temp: (!) 36.2 C (!) 36.2 C  SpO2:      Last Pain:  Vitals:   06/21/18 1338  TempSrc: Tympanic  PainSc:                  Heba Ige S

## 2018-06-21 NOTE — Op Note (Signed)
[facility] Gastroenterology Patient Name: Tina Parker Procedure Date: 06/21/2018 12:55 PM MRN: [id number] Account #: [id number] Date of Birth: January 02, 1971 Admit Type: Outpatient Age: 47 Room: [facility] ENDO ROOM 3 Gender: Female Note Status: Finalized Procedure:            Colonoscopy Indications:          Screening in patient at increased risk: Family history                        of 1st-degree relative with colorectal cancer Providers:            Manya Silvas, MD Referring MD:         No Local Md, MD (Referring MD) Medicines:            Propofol per Anesthesia Complications:        No immediate complications. Procedure:            Pre-Anesthesia Assessment:                       - After reviewing the risks and benefits, the patient                        was deemed in satisfactory condition to undergo the                        procedure.                       After obtaining informed consent, the colonoscope was                        passed under direct vision. Throughout the procedure,                        the patient's blood pressure, pulse, and oxygen                        saturations were monitored continuously. The                        Colonoscope was introduced through the anus and                        advanced to the the cecum, identified by appendiceal                        orifice and ileocecal valve. Findings:      Internal hemorrhoids were found during endoscopy. The hemorrhoids were       small and Grade I (internal hemorrhoids that do not prolapse).      The exam was otherwise without abnormality. No abnormality in colon. Impression:           - Internal hemorrhoids.                       - The examination was otherwise normal.                       - No specimens collected. Recommendation:       - Await pathology results. Manya Silvas, MD 06/21/2018 1:38:08 PM This report has been signed electronically. Number  of Addenda:  0 Note Initiated On: 06/21/2018 12:55 PM Scope Withdrawal Time: 0 hours 8 minutes 29 seconds  Total Procedure Duration: 0 hours 16 minutes 51 seconds       [facility]

## 2018-06-21 NOTE — Op Note (Signed)
[facility] Gastroenterology Patient Name: Tina Parker Procedure Date: 06/21/2018 12:56 PM MRN: [id number] Account #: [id number] Date of Birth: 01-14-1971 Admit Type: Outpatient Age: 47 Room: [facility] ENDO ROOM 3 Gender: Female Note Status: Finalized Procedure:            Upper GI endoscopy Indications:          Dysphagia Providers:            Manya Silvas, MD Medicines:            Propofol per Anesthesia Complications:        No immediate complications. Procedure:            Pre-Anesthesia Assessment:                       - After reviewing the risks and benefits, the patient                        was deemed in satisfactory condition to undergo the                        procedure.                       After obtaining informed consent, the endoscope was                        passed under direct vision. Throughout the procedure,                        the patient's blood pressure, pulse, and oxygen                        saturations were monitored continuously. The Endoscope                        was introduced through the mouth, and advanced to the                        second part of duodenum. The upper GI endoscopy was                        somewhat difficult due to narrowing. The patient                        tolerated the procedure well. Findings:      A moderate Schatzki ring was found at the gastroesophageal junction. At       the end of the procedure A guidewire was placed and the scope was       withdrawn. Dilation was performed with a Savary dilator with moderate       resistance at 16 mm.      Patchy mild inflammation characterized by erythema and granularity was       found in the gastric body. Biopsies were taken with a cold forceps for       histology. Biopsies were taken with a cold forceps for Helicobacter       pylori testing.      The examined duodenum was normal. Impression:           - Moderate Schatzki ring. Dilated.             -  Gastritis. Biopsied.                       - Normal examined duodenum. Recommendation:       - Await pathology results. Manya Silvas, MD 06/21/2018 1:15:14 PM This report has been signed electronically. Number of Addenda: 0 Note Initiated On: 06/21/2018 12:56 PM      [facility]

## 2018-06-21 NOTE — Transfer of Care (Signed)
Immediate Anesthesia Transfer of Care Note  Patient: Tina Parker  Procedure(s) Performed: COLONOSCOPY WITH PROPOFOL (N/A ) ESOPHAGOGASTRODUODENOSCOPY (EGD) WITH PROPOFOL (N/A )  Patient Location: PACU and Endoscopy Unit  Anesthesia Type:General  Level of Consciousness: drowsy  Airway & Oxygen Therapy: Patient Spontanous Breathing and Patient connected to nasal cannula oxygen  Post-op Assessment: Report given to RN and Post -op Vital signs reviewed and stable  Post vital signs: Reviewed and stable  Last Vitals:  Vitals Value Taken Time  BP 125/97 06/21/2018  1:38 PM  Temp 36.2 C 06/21/2018  1:38 PM  Pulse 124 06/21/2018  1:38 PM  Resp 27 06/21/2018  1:38 PM  SpO2 98 % 06/21/2018  1:38 PM  Vitals shown include unvalidated device data.  Last Pain:  Vitals:   06/21/18 1338  TempSrc: Tympanic  PainSc:          Complications: No apparent anesthesia complications

## 2018-06-24 LAB — SURGICAL PATHOLOGY

## 2018-06-25 ENCOUNTER — Encounter: Payer: Self-pay | Admitting: Unknown Physician Specialty

## 2018-06-27 DIAGNOSIS — J0140 Acute pansinusitis, unspecified: Secondary | ICD-10-CM | POA: Diagnosis not present

## 2018-06-27 DIAGNOSIS — J069 Acute upper respiratory infection, unspecified: Secondary | ICD-10-CM | POA: Diagnosis not present

## 2018-06-29 ENCOUNTER — Other Ambulatory Visit: Payer: Self-pay | Admitting: Neurosurgery

## 2018-06-29 DIAGNOSIS — D329 Benign neoplasm of meninges, unspecified: Secondary | ICD-10-CM

## 2018-07-06 DIAGNOSIS — H6983 Other specified disorders of Eustachian tube, bilateral: Secondary | ICD-10-CM | POA: Diagnosis not present

## 2018-07-06 DIAGNOSIS — H9041 Sensorineural hearing loss, unilateral, right ear, with unrestricted hearing on the contralateral side: Secondary | ICD-10-CM | POA: Diagnosis not present

## 2018-07-07 IMAGING — XA DG FLUORO GUIDE NDL PLC/BX
1 series · 1 of 1 positions shown · non-contrast
Comparison: none

CLINICAL DATA: RIGHT hip pain.

[Series 1: ortho standard · 1 of 1 slices shown]
[im 1/1]
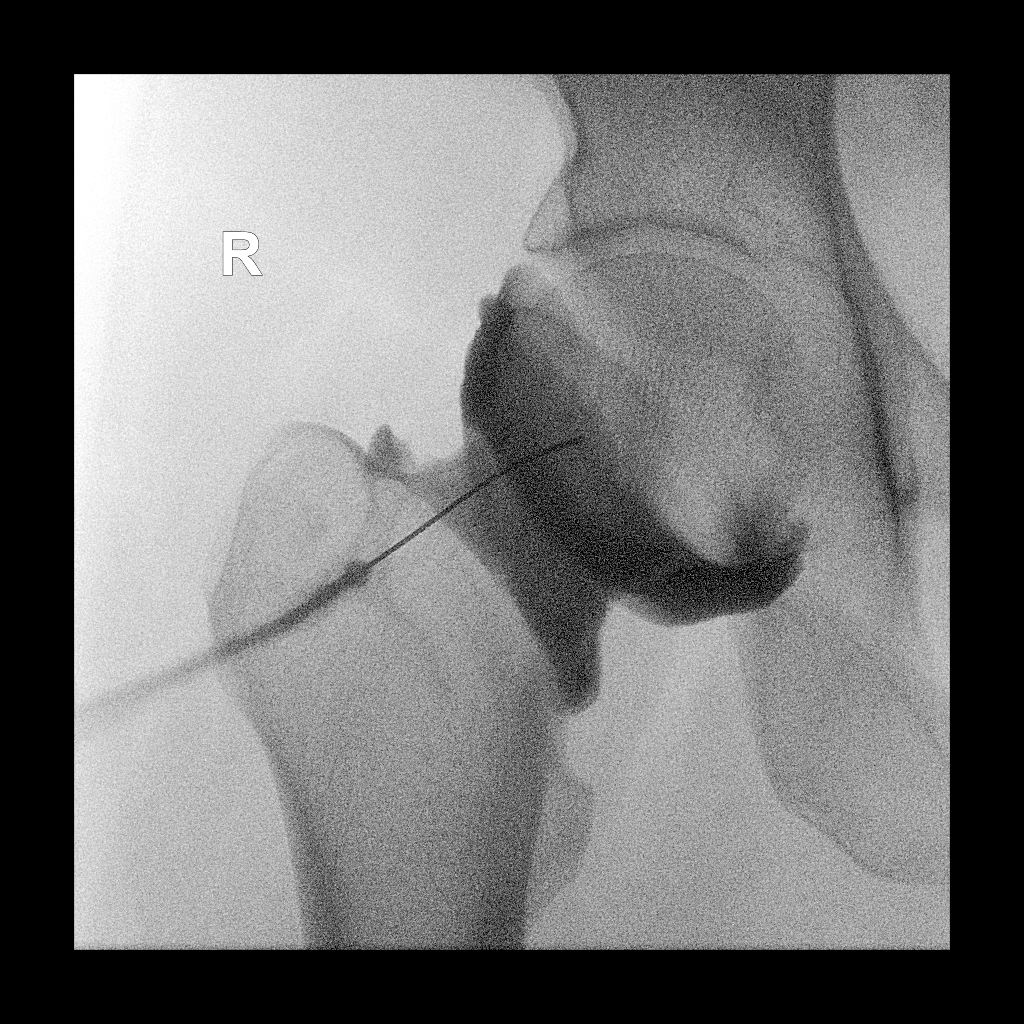

[1 of 1 positions shown; findings below may reference images not displayed]

EXAM:
HIP INJECTION FOR MRI

FLUOROSCOPY TIME:  9 seconds corresponding to a Dose Area Product of
16.21 ?Gy*m2

PROCEDURE:
After a thorough discussion of risks and benefits of the procedure,
written and oral informed consent was obtained. The consent
discussion included the risk of bleeding, infection and injury to
nerves and adjacent blood vessels. Extra-articular injection was
also a possible risk discussed. Time-out was performed.

Preliminary localization was performed over the RIGHT hip. The area
was marked over the junction of the RIGHT femoral head and neck.

After prep and drape in the usual sterile fashion, the skin and
deeper subcutaneous tissues were anesthetized with 1% Lidocaine
without Epinephrine. Under fluoroscopic guidance, a 22 gauge
inch spinal needle was advanced into the joint at the lateral margin
of the junction of the femoral head and neck. Intra-articular
injection of Lidocaine was performed which flowed freely and
subsequently the joint was distended with 10 ml of a [DATE] dilution
of Multihance contrast. The MR arthrogram solution was as follows:
15 ml of Isovue M 200 contrast agent, 0.1 mL Multihance, 5 ml of 1%
Lidocaine. An end point was felt as well as the patient experiencing
pressure and the injection was discontinued, the needle removed, and
a sterile dressing applied. The patient was taken to MRI for
subsequent imaging.

The patient tolerated the procedure well and there were no
complications.
IMPRESSION: Successful RIGHT hip fluoroscopically guided injection.

## 2018-07-12 ENCOUNTER — Ambulatory Visit
Admission: RE | Admit: 2018-07-12 | Discharge: 2018-07-12 | Disposition: A | Discharge status: Home / Self Care | Payer: 59 | Source: Ambulatory Visit | Attending: Neurosurgery | Admitting: Neurosurgery

## 2018-07-12 DIAGNOSIS — D329 Benign neoplasm of meninges, unspecified: Secondary | ICD-10-CM

## 2018-07-12 DIAGNOSIS — D320 Benign neoplasm of cerebral meninges: Secondary | ICD-10-CM | POA: Diagnosis not present

## 2018-07-12 MED ORDER — GADOBENATE DIMEGLUMINE 529 MG/ML IV SOLN
16.0000 mL | Freq: Once | INTRAVENOUS | Status: AC | PRN
  Administered 2018-07-12: 16 mL via INTRAVENOUS

## 2018-07-15 DIAGNOSIS — Z6832 Body mass index (BMI) 32.0-32.9, adult: Secondary | ICD-10-CM | POA: Diagnosis not present

## 2018-07-15 DIAGNOSIS — C719 Malignant neoplasm of brain, unspecified: Secondary | ICD-10-CM | POA: Diagnosis not present

## 2018-07-15 DIAGNOSIS — D329 Benign neoplasm of meninges, unspecified: Secondary | ICD-10-CM | POA: Diagnosis not present

## 2018-07-22 ENCOUNTER — Other Ambulatory Visit: Payer: Self-pay | Admitting: Internal Medicine

## 2018-07-23 ENCOUNTER — Other Ambulatory Visit: Payer: Self-pay | Admitting: Internal Medicine

## 2018-07-23 DIAGNOSIS — I10 Essential (primary) hypertension: Secondary | ICD-10-CM

## 2018-07-23 MED ORDER — HYDROCHLOROTHIAZIDE 12.5 MG PO TABS: 12.5000 mg | ORAL_TABLET | Freq: Every day | ORAL | 3 refills | Status: DC

## 2018-07-23 NOTE — Telephone Encounter (Signed)
Copied from [place] [id number]. Topic: Quick Communication - See Telephone Encounter >> Jul 23, 2018  8:20 AM Conception Chancy, NT wrote: CRM for notification. See Telephone encounter for: 07/23/18.  Patient is requesting a refill on hydrochlorothiazide (HYDRODIURIL) 12.5 MG tablet. She states the pharmacy told her they requested this last week. Please advise.   [facility] [place], [address] AT [place] [address] Phone: [phone] Fax: [phone]

## 2018-08-16 ENCOUNTER — Ambulatory Visit: Payer: Self-pay | Admitting: Internal Medicine

## 2018-08-23 DIAGNOSIS — R062 Wheezing: Secondary | ICD-10-CM | POA: Diagnosis not present

## 2018-08-23 DIAGNOSIS — J301 Allergic rhinitis due to pollen: Secondary | ICD-10-CM | POA: Diagnosis not present

## 2018-08-23 DIAGNOSIS — J3089 Other allergic rhinitis: Secondary | ICD-10-CM | POA: Diagnosis not present

## 2018-08-24 ENCOUNTER — Ambulatory Visit: Payer: 59 | Admitting: Internal Medicine

## 2018-08-24 ENCOUNTER — Encounter: Payer: Self-pay | Admitting: Internal Medicine

## 2018-08-24 VITALS — BP 126/78 | HR 90 | Temp 98.1°F | Resp 16 | Wt 172.6 lb

## 2018-08-24 DIAGNOSIS — D649 Anemia, unspecified: Secondary | ICD-10-CM

## 2018-08-24 DIAGNOSIS — D329 Benign neoplasm of meninges, unspecified: Secondary | ICD-10-CM

## 2018-08-24 DIAGNOSIS — E7800 Pure hypercholesterolemia, unspecified: Secondary | ICD-10-CM

## 2018-08-24 DIAGNOSIS — F419 Anxiety disorder, unspecified: Secondary | ICD-10-CM

## 2018-08-24 DIAGNOSIS — R2230 Localized swelling, mass and lump, unspecified upper limb: Secondary | ICD-10-CM

## 2018-08-24 DIAGNOSIS — I10 Essential (primary) hypertension: Secondary | ICD-10-CM

## 2018-08-24 DIAGNOSIS — E559 Vitamin D deficiency, unspecified: Secondary | ICD-10-CM

## 2018-08-24 DIAGNOSIS — R222 Localized swelling, mass and lump, trunk: Secondary | ICD-10-CM

## 2018-08-24 DIAGNOSIS — M059 Rheumatoid arthritis with rheumatoid factor, unspecified: Secondary | ICD-10-CM

## 2018-08-24 DIAGNOSIS — K219 Gastro-esophageal reflux disease without esophagitis: Secondary | ICD-10-CM

## 2018-08-24 DIAGNOSIS — E039 Hypothyroidism, unspecified: Secondary | ICD-10-CM

## 2018-08-24 DIAGNOSIS — Z9109 Other allergy status, other than to drugs and biological substances: Secondary | ICD-10-CM

## 2018-08-24 MED ORDER — SERTRALINE HCL 50 MG PO TABS: ORAL_TABLET | ORAL | 2 refills | Status: DC

## 2018-08-24 NOTE — Progress Notes (Signed)
Patient ID: Tina Parker, female   DOB: Jun 28, 1971, 48 y.o.   MRN: [id number]   Subjective:    Patient ID: Tina Parker, female    DOB: 1971/05/01, 48 y.o.   MRN: [id number]  HPI  Patient here for a scheduled follow up.  She reports she is doing relatively well.  Taking allegra in the am, xyzal in the evening and singulair.  Allergies under reasonable controlled.  No increased cough.  No sob.  No acid reflux.  No abdominal pain.  Bowels moving.  Concerned regarding thickening in her left axilla.  No pain.  No nipple discharge.  No nodules.     Past Medical History:  Diagnosis Date  . Allergic rhinitis   . Anemia   . Arthritis    RA  . Carpal tunnel syndrome   . Dysphagia   . Esophageal spasm 01/16/2014  . GERD (gastroesophageal reflux disease)   . Graves disease    remission, no ablation, positive medical treatment  . History of hiatal hernia   . Hypertension   . Migraine headache    migraines  . PPD positive    hepatitis secondary to [place]  . Pure hypercholesterolemia   . Rheumatoid arthritis(714.0)    positive anti CCP antibodies, positive RF, oligo-articular, MTX   Past Surgical History:  Procedure Laterality Date  . CARPAL TUNNEL RELEASE Right   . CESAREAN SECTION  1996  . COLONOSCOPY  03/14/2002, 08/16/2007, 03/07/2013   [facility]-father  . COLONOSCOPY WITH PROPOFOL N/A 06/21/2018   Procedure: COLONOSCOPY WITH PROPOFOL;  Surgeon: Manya Silvas, MD;  Location: [facility] ENDOSCOPY;  Service: Endoscopy;  Laterality: N/A;  . CRANIOTOMY Left 06/25/2017   Procedure: CRANIOTOMY TEMPORAL LEFT FOR TUMOR RESECTION;  Surgeon: Ashok Pall, MD;  Location: [place];  Service: Neurosurgery;  Laterality: Left;  . ESOPHAGOGASTRODUODENOSCOPY  03/14/2002, 03/07/2013  . ESOPHAGOGASTRODUODENOSCOPY (EGD) WITH PROPOFOL N/A 06/21/2018   Procedure: ESOPHAGOGASTRODUODENOSCOPY (EGD) WITH PROPOFOL;  Surgeon: Manya Silvas, MD;  Location: [facility] ENDOSCOPY;  Service: Endoscopy;  Laterality: N/A;  . Post  Septoplasty and turbinate reduction  2007  . TRIGGER FINGER RELEASE     Family History  Problem Relation Age of Onset  . Cancer Mother        Breast Cancer  . Breast cancer Mother 54  . Cancer Father        Colon Cancer  . Heart disease Father        Hx of MI  . Hypertension Father   . Diabetes Father   . Cancer Maternal Grandmother        lung cancer  . Cancer Maternal Uncle        esophageal   Social History   Socioeconomic History  . Marital status: Married    Spouse name: Not on file  . Number of children: Not on file  . Years of education: Not on file  . Highest education level: Not on file  Occupational History  . Not on file  Social Needs  . Financial resource strain: Not on file  . Food insecurity:    Worry: Not on file    Inability: Not on file  . Transportation needs:    Medical: Not on file    Non-medical: Not on file  Tobacco Use  . Smoking status: Never Smoker  . Smokeless tobacco: Never Used  Substance and Sexual Activity  . Alcohol use: Yes    Alcohol/week: 0.0 standard drinks    Comment: occasionally  . Drug use:  No  . Sexual activity: Not on file  Lifestyle  . Physical activity:    Days per week: Not on file    Minutes per session: Not on file  . Stress: Not on file  Relationships  . Social connections:    Talks on phone: Not on file    Gets together: Not on file    Attends religious service: Not on file    Active member of club or organization: Not on file    Attends meetings of clubs or organizations: Not on file    Relationship status: Not on file  Other Topics Concern  . Not on file  Social History Narrative   [employer]     Outpatient Encounter Medications as of 08/24/2018  Medication Sig  . albuterol (PROVENTIL HFA;VENTOLIN HFA) 108 (90 BASE) MCG/ACT inhaler Inhale 2 puffs into the lungs every 6 (six) hours as needed for wheezing or shortness of breath.  . Dexlansoprazole (DEXILANT) 30 MG capsule Take 30 mg by mouth daily.  .  hydrochlorothiazide (HYDRODIURIL) 12.5 MG tablet Take 1 tablet (12.5 mg total) by mouth daily.  Marland Kitchen ibuprofen (ADVIL,MOTRIN) 800 MG tablet Take 800 mg by mouth 3 (three) times daily as needed. TID PRN for pain  . levocetirizine (XYZAL) 5 MG tablet Take 5 mg by mouth at bedtime.  Marland Kitchen losartan (COZAAR) 50 MG tablet TAKE 1 TABLET BY MOUTH ONCE DAILY  . medroxyPROGESTERone (DEPO-PROVERA) 150 MG/ML injection Inject 150 mg into the muscle every 3 (three) months.   . methotrexate (RHEUMATREX) 2.5 MG tablet Take 22.5 mg by mouth every Monday. Caution:Chemotherapy. Protect from light. Take 9 tabs po weekly with meals  . montelukast (SINGULAIR) 10 MG tablet take 1 tablet by mouth at bedtime  . OLUMIANT 2 MG TABS   . sertraline (ZOLOFT) 50 MG tablet Take 1 1/2 tablet per day  . valACYclovir (VALTREX) 1000 MG tablet Take 1 tablet (1,000 mg total) by mouth 2 (two) times daily.  . [DISCONTINUED] cefUROXime (CEFTIN) 250 MG tablet Take 1 tablet (250 mg total) by mouth 2 (two) times daily with a meal. (Patient not taking: Reported on 06/21/2018)  . [DISCONTINUED] sertraline (ZOLOFT) 50 MG tablet Take 1 tablet (50 mg total) by mouth daily.   No facility-administered encounter medications on file as of 08/24/2018.     Review of Systems  Constitutional: Negative for appetite change and unexpected weight change.  HENT: Negative for congestion and sinus pressure.   Respiratory: Negative for cough, chest tightness and shortness of breath.   Cardiovascular: Negative for chest pain, palpitations and leg swelling.  Gastrointestinal: Negative for abdominal pain, diarrhea, nausea and vomiting.  Genitourinary: Negative for difficulty urinating and dysuria.  Musculoskeletal: Negative for joint swelling and myalgias.  Skin: Negative for color change and rash.  Neurological: Negative for dizziness, light-headedness and headaches.  Psychiatric/Behavioral: Negative for agitation and dysphoric mood.       Objective:     Physical Exam Constitutional:      General: She is not in acute distress.    Appearance: Normal appearance.  HENT:     Nose: Nose normal. No congestion.     Mouth/Throat:     Pharynx: No oropharyngeal exudate or posterior oropharyngeal erythema.  Neck:     Musculoskeletal: Neck supple. No muscular tenderness.     Thyroid: No thyromegaly.  Cardiovascular:     Rate and Rhythm: Normal rate and regular rhythm.  Pulmonary:     Effort: No respiratory distress.     Breath sounds:  Normal breath sounds. No wheezing.     Comments: Breasts:  No nipple discharge or nipple retraction present.  Could not appreciate any distinct nodule.  Fullness left axilla.   Abdominal:     General: Bowel sounds are normal.     Palpations: Abdomen is soft.     Tenderness: There is no abdominal tenderness.  Musculoskeletal:        General: No swelling or tenderness.  Lymphadenopathy:     Cervical: No cervical adenopathy.  Skin:    Findings: No erythema or rash.  Neurological:     Mental Status: She is alert.  Psychiatric:        Mood and Affect: Mood normal.        Behavior: Behavior normal.     BP 126/78   Pulse 90   Temp 98.1 F (36.7 C) (Oral)   Resp 16   Wt 172 lb 9.6 oz (78.3 kg)   SpO2 99%   BMI 32.61 kg/m  Wt Readings from Last 3 Encounters:  08/24/18 172 lb 9.6 oz (78.3 kg)  06/21/18 170 lb (77.1 kg)  06/07/18 176 lb (79.8 kg)     Lab Results  Component Value Date   WBC 6.5 06/22/2017   HGB 12.6 06/22/2017   HCT 37.9 06/22/2017   PLT 322 06/22/2017   GLUCOSE 94 05/17/2018   CHOL 231 (H) 05/17/2018   TRIG 77.0 05/17/2018   HDL 56.70 05/17/2018   LDLCALC 159 (H) 05/17/2018   ALT 21 05/17/2018   AST 21 05/17/2018   NA 139 05/17/2018   K 4.4 05/17/2018   CL 105 05/17/2018   CREATININE 1.01 05/17/2018   BUN 13 05/17/2018   CO2 27 05/17/2018   TSH 2.76 12/21/2017    Mr Brain W Wo Contrast  Result Date: 07/12/2018 CLINICAL DATA:  Prior meningioma resection. Follow-up.  No current complaints. Creatinine was obtained on site at [place] at [address] Results: Creatinine 1.0 mg/dL. EXAM: MRI HEAD WITHOUT AND WITH CONTRAST TECHNIQUE: Multiplanar, multiecho pulse sequences of the brain and surrounding structures were obtained without and with intravenous contrast. CONTRAST:  5mL MULTIHANCE GADOBENATE DIMEGLUMINE 529 MG/ML IV SOLN COMPARISON:  12/17/2017 FINDINGS: Brain: There is no evidence of acute infarct, intracranial hemorrhage, midline shift, or extra-axial fluid collection. Scattered small foci of T2 hyperintensity in the cerebral white matter bilaterally are unchanged and nonspecific though may reflect minimal chronic small vessel ischemic disease or sequelae of migraines. The ventricles are normal in size. 7 mm nonenhancing extra-axial mass inferiorly in the fourth ventricle is unchanged. Postsurgical changes are again seen with encephalomalacia anteriorly in the left temporal lobe. Mild dural thickening and enhancement in the left middle cranial fossa and over the left cerebral convexity subjacent to the craniotomy are unchanged and likely postoperative. No masslike enhancement is identified. Vascular: Major intracranial vascular flow voids are preserved. Skull and upper cervical spine: Left pterional craniotomy. Sinuses/Orbits: Unremarkable orbits. New bubbly fluid in the right maxillary sinus. Clear mastoid air cells. Other: None. IMPRESSION: 1. Postsurgical changes from left middle cranial fossa meningioma resection without evidence of recurrent tumor. 2. 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 Electronically Signed   By: Logan Bores M.D.   On: 07/12/2018 10:40       Assessment & Plan:   Problem List Items Addressed This Visit    Anemia    Follow cbc.        Anxiety    Feels like zoloft is helping.  Doing better.  Follow.        Relevant Medications   sertraline (ZOLOFT) 50 MG tablet   Environmental  allergies    Stable on current regimen.  Follow.        Essential hypertension, benign    Blood pressure under good control.  Continue same medication regimen.  Follow pressures.  Follow metabolic panel.        Relevant Orders   Basic metabolic panel   GERD (gastroesophageal reflux disease)    Controlled on current regimen.        Hypercholesterolemia    Low cholesterol diet and exercise.  Follow lipid panel.       Relevant Orders   Hepatic function panel   Lipid panel   Hypothyroidism    On thyroid replacement.  Follow tsh.       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  Recommended f/u in one year.        Rheumatoid arthritis ([place])    Followed by Dr Jefm Bryant.  Stable.        Vitamin D deficiency    Follow vitamin D level.         Other Visit Diagnoses    Axillary fullness    -  Primary   Exam as outlined.  Schedule left diagnostic mammogram with ultrasound.    Relevant Orders   MM DIAG BREAST TOMO UNI LEFT   US BREAST LTD UNI LEFT INC AXILLA       Einar Pheasant, MD

## 2018-08-28 ENCOUNTER — Encounter: Payer: Self-pay | Admitting: Internal Medicine

## 2018-08-28 NOTE — Assessment & Plan Note (Signed)
Follow vitamin D level.  

## 2018-08-28 NOTE — Assessment & Plan Note (Signed)
Blood pressure under good control.  Continue same medication regimen.  Follow pressures.  Follow metabolic panel.   

## 2018-08-28 NOTE — Assessment & Plan Note (Signed)
Follow cbc.  

## 2018-08-28 NOTE — Assessment & Plan Note (Signed)
Low cholesterol diet and exercise.  Follow lipid panel.   

## 2018-08-28 NOTE — Assessment & Plan Note (Signed)
States saw neurosurgery.  Recommended f/u in one year.

## 2018-08-28 NOTE — Assessment & Plan Note (Signed)
Stable on current regimen.  Follow.   

## 2018-08-28 NOTE — Assessment & Plan Note (Signed)
Controlled on current regimen.   

## 2018-08-28 NOTE — Assessment & Plan Note (Signed)
Feels like zoloft is helping.  Doing better.  Follow.

## 2018-08-28 NOTE — Assessment & Plan Note (Signed)
On thyroid replacement.  Follow tsh.  

## 2018-08-28 NOTE — Assessment & Plan Note (Signed)
Followed by Dr Kernodle.  Stable.  

## 2018-08-30 ENCOUNTER — Other Ambulatory Visit: Payer: Self-pay

## 2018-08-30 DIAGNOSIS — M0579 Rheumatoid arthritis with rheumatoid factor of multiple sites without organ or systems involvement: Secondary | ICD-10-CM | POA: Diagnosis not present

## 2018-08-30 DIAGNOSIS — Z79899 Other long term (current) drug therapy: Secondary | ICD-10-CM | POA: Diagnosis not present

## 2018-09-06 ENCOUNTER — Ambulatory Visit
Admission: RE | Admit: 2018-09-06 | Discharge: 2018-09-06 | Disposition: A | Discharge status: Home / Self Care | Payer: 59 | Source: Ambulatory Visit | Attending: Internal Medicine | Admitting: Internal Medicine

## 2018-09-06 DIAGNOSIS — R2230 Localized swelling, mass and lump, unspecified upper limb: Secondary | ICD-10-CM

## 2018-09-06 DIAGNOSIS — N6489 Other specified disorders of breast: Secondary | ICD-10-CM | POA: Diagnosis not present

## 2018-09-06 DIAGNOSIS — R700 Elevated erythrocyte sedimentation rate: Secondary | ICD-10-CM | POA: Diagnosis not present

## 2018-09-06 DIAGNOSIS — Z803 Family history of malignant neoplasm of breast: Secondary | ICD-10-CM | POA: Diagnosis not present

## 2018-09-06 DIAGNOSIS — R222 Localized swelling, mass and lump, trunk: Secondary | ICD-10-CM | POA: Insufficient documentation

## 2018-09-06 DIAGNOSIS — M654 Radial styloid tenosynovitis [de Quervain]: Secondary | ICD-10-CM | POA: Diagnosis not present

## 2018-09-06 DIAGNOSIS — R922 Inconclusive mammogram: Secondary | ICD-10-CM | POA: Diagnosis not present

## 2018-09-06 DIAGNOSIS — M0579 Rheumatoid arthritis with rheumatoid factor of multiple sites without organ or systems involvement: Secondary | ICD-10-CM | POA: Diagnosis not present

## 2018-09-07 ENCOUNTER — Ambulatory Visit (INDEPENDENT_AMBULATORY_CARE_PROVIDER_SITE_OTHER): Payer: 59

## 2018-09-07 DIAGNOSIS — Z3042 Encounter for surveillance of injectable contraceptive: Secondary | ICD-10-CM

## 2018-09-07 MED ORDER — MEDROXYPROGESTERONE ACETATE 150 MG/ML IM SUSP
150.0000 mg | Freq: Once | INTRAMUSCULAR | Status: AC
  Administered 2018-09-07: 150 mg via INTRAMUSCULAR

## 2018-09-07 NOTE — Progress Notes (Signed)
[name] presents today for injection per MD orders. Depo Porvera injection administered IM  in right Gluteal. Administration without incident. Patient tolerated well.  Gae Bon, CMA

## 2018-09-08 ENCOUNTER — Other Ambulatory Visit: Payer: Self-pay | Admitting: Internal Medicine

## 2018-09-08 DIAGNOSIS — Z803 Family history of malignant neoplasm of breast: Secondary | ICD-10-CM

## 2018-09-08 DIAGNOSIS — N6459 Other signs and symptoms in breast: Secondary | ICD-10-CM

## 2018-09-08 NOTE — Progress Notes (Signed)
Order placed for surgery referral.  

## 2018-09-23 ENCOUNTER — Ambulatory Visit: Payer: 59 | Admitting: General Surgery

## 2018-10-11 ENCOUNTER — Other Ambulatory Visit (INDEPENDENT_AMBULATORY_CARE_PROVIDER_SITE_OTHER): Payer: 59

## 2018-10-11 ENCOUNTER — Other Ambulatory Visit: Payer: Self-pay

## 2018-10-11 DIAGNOSIS — I10 Essential (primary) hypertension: Secondary | ICD-10-CM

## 2018-10-11 DIAGNOSIS — E7800 Pure hypercholesterolemia, unspecified: Secondary | ICD-10-CM

## 2018-10-11 DIAGNOSIS — E039 Hypothyroidism, unspecified: Secondary | ICD-10-CM

## 2018-10-11 LAB — HEPATIC FUNCTION PANEL
ALT: 15 U/L (ref 0–35)
AST: 20 U/L (ref 0–37)
Albumin: 3.9 g/dL (ref 3.5–5.2)
Alkaline Phosphatase: 97 U/L (ref 39–117)
Bilirubin, Direct: 0.1 mg/dL (ref 0.0–0.3)
Total Bilirubin: 0.4 mg/dL (ref 0.2–1.2)
Total Protein: 7.3 g/dL (ref 6.0–8.3)

## 2018-10-11 LAB — LIPID PANEL
Cholesterol: 262 mg/dL — ABNORMAL HIGH (ref 0–200)
HDL: 57.8 mg/dL (ref >39.00)
LDL Cholesterol: 187 mg/dL — ABNORMAL HIGH (ref 0–99)
NonHDL: 204.34
Total CHOL/HDL Ratio: 5
Triglycerides: 86 mg/dL (ref 0.0–149.0)
VLDL: 17.2 mg/dL (ref 0.0–40.0)

## 2018-10-11 LAB — BASIC METABOLIC PANEL
BUN: 18 mg/dL (ref 6–23)
CO2: 27 mEq/L (ref 19–32)
Calcium: 9.8 mg/dL (ref 8.4–10.5)
Chloride: 103 mEq/L (ref 96–112)
Creatinine, Ser: 0.98 mg/dL (ref 0.40–1.20)
GFR: 72.82 mL/min (ref >60.00)
Glucose, Bld: 95 mg/dL (ref 70–99)
Potassium: 4.1 mEq/L (ref 3.5–5.1)
Sodium: 137 mEq/L (ref 135–145)

## 2018-10-11 LAB — TSH: TSH: 2.91 u[IU]/mL (ref 0.35–4.50)

## 2018-10-15 ENCOUNTER — Ambulatory Visit (INDEPENDENT_AMBULATORY_CARE_PROVIDER_SITE_OTHER): Payer: 59 | Admitting: Internal Medicine

## 2018-10-15 ENCOUNTER — Encounter: Payer: Self-pay | Admitting: Internal Medicine

## 2018-10-15 ENCOUNTER — Other Ambulatory Visit: Payer: Self-pay

## 2018-10-15 DIAGNOSIS — I10 Essential (primary) hypertension: Secondary | ICD-10-CM

## 2018-10-15 DIAGNOSIS — E039 Hypothyroidism, unspecified: Secondary | ICD-10-CM

## 2018-10-15 DIAGNOSIS — D649 Anemia, unspecified: Secondary | ICD-10-CM

## 2018-10-15 DIAGNOSIS — D329 Benign neoplasm of meninges, unspecified: Secondary | ICD-10-CM

## 2018-10-15 DIAGNOSIS — F419 Anxiety disorder, unspecified: Secondary | ICD-10-CM

## 2018-10-15 DIAGNOSIS — E7800 Pure hypercholesterolemia, unspecified: Secondary | ICD-10-CM

## 2018-10-15 DIAGNOSIS — E559 Vitamin D deficiency, unspecified: Secondary | ICD-10-CM

## 2018-10-15 DIAGNOSIS — K219 Gastro-esophageal reflux disease without esophagitis: Secondary | ICD-10-CM

## 2018-10-15 DIAGNOSIS — M059 Rheumatoid arthritis with rheumatoid factor, unspecified: Secondary | ICD-10-CM

## 2018-10-15 DIAGNOSIS — Z9109 Other allergy status, other than to drugs and biological substances: Secondary | ICD-10-CM

## 2018-10-15 DIAGNOSIS — Z803 Family history of malignant neoplasm of breast: Secondary | ICD-10-CM

## 2018-10-15 MED ORDER — FLUOXETINE HCL 10 MG PO CAPS: 10.0000 mg | ORAL_CAPSULE | Freq: Every day | ORAL | 2 refills | Status: DC

## 2018-10-15 NOTE — Progress Notes (Addendum)
Patient ID: Tina Parker, female   DOB: [birth year], 48 y.o.   MRN: [id number] Virtual Visit via Video Note  This visit type was conducted due to national recommendations for restrictions regarding the COVID-19 pandemic (e.g. social distancing).  This format is felt to be most appropriate for this patient at this time.  All issues noted in this document were discussed and addressed.  No physical exam was performed (except for noted visual exam findings with Video Visits).   I connected with Naylea Peloso on 10/15/18 at  9:00 AM EDT by a video enabled telemedicine application.  Verified that I am speaking with the correct person using two identifiers. Location patient: home Location provider: work Persons participating in the virtual visit: patient, provider  I discussed the limitations, risks, security and privacy concerns of performing an evaluation and management service by video. The patient expressed understanding and agreed to proceed.   Reason for visit: scheduled follow up.    HPI: Scheduled follow up.  Has been having issues with depression and anxiety.  Was started on zoloft last visit.  Started having GI issues with increased bloating, etc.  She stopped the zoloft and symptoms have now resolved.  She still feels she needs to be on something to help level things out.  No suicidal ideations.  Discussed other treatment options.  She denies any fever or sob.  No chest congestion or cough.  No known COVID exposure.  She does have a history of allergies and with the increased pollen, has had some allergy symptoms.  Taking xyzal, allegra and singulair.  Using her inhalers.  Feels this regimen is controlling things well.  No chest pain.  No acid reflux.  No abdominal pain or cramping now.  Bowels moving with dulcolax qod.  Her blood pressure has been running low.  Some light headedness at times.  Was questioning if blood pressure running too low at times.  Discussed holding hctz.  No headache.   Discussed lab results.  Discussed increased cholesterol.  Discussed low cholesterol diet and exercise.     ROS: See pertinent positives and negatives per HPI.  Past Medical History:  Diagnosis Date  . Allergic rhinitis   . Anemia   . Arthritis    RA  . Carpal tunnel syndrome   . Dysphagia   . Esophageal spasm 01/16/2014  . GERD (gastroesophageal reflux disease)   . Graves disease    remission, no ablation, positive medical treatment  . History of hiatal hernia   . Hypertension   . Migraine headache    migraines  . PPD positive    hepatitis secondary to [place]  . Pure hypercholesterolemia   . Rheumatoid arthritis(714.0)    positive anti CCP antibodies, positive RF, oligo-articular, MTX    Past Surgical History:  Procedure Laterality Date  . CARPAL TUNNEL RELEASE Right   . CESAREAN SECTION  1996  . COLONOSCOPY  03/14/2002, 08/16/2007, 03/07/2013   FHCC-father  . COLONOSCOPY WITH PROPOFOL N/A 06/21/2018   Procedure: COLONOSCOPY WITH PROPOFOL;  Surgeon: Manya Silvas, MD;  Location: [facility] ENDOSCOPY;  Service: Endoscopy;  Laterality: N/A;  . CRANIOTOMY Left 06/25/2017   Procedure: CRANIOTOMY TEMPORAL LEFT FOR TUMOR RESECTION;  Surgeon: Ashok Pall, MD;  Location: [place];  Service: Neurosurgery;  Laterality: Left;  . ESOPHAGOGASTRODUODENOSCOPY  03/14/2002, 03/07/2013  . ESOPHAGOGASTRODUODENOSCOPY (EGD) WITH PROPOFOL N/A 06/21/2018   Procedure: ESOPHAGOGASTRODUODENOSCOPY (EGD) WITH PROPOFOL;  Surgeon: Manya Silvas, MD;  Location: [facility] ENDOSCOPY;  Service: Endoscopy;  Laterality: N/A;  .  Post Septoplasty and turbinate reduction  2007  . TRIGGER FINGER RELEASE      Family History  Problem Relation Age of Onset  . Cancer Mother        Breast Cancer  . Breast cancer Mother 41  . Cancer Father        Colon Cancer  . Heart disease Father        Hx of MI  . Hypertension Father   . Diabetes Father   . Cancer Maternal Grandmother        lung cancer  . Cancer Maternal Uncle         esophageal    SOCIAL HX: reviewed.    Current Outpatient Medications:  .  albuterol (PROVENTIL HFA;VENTOLIN HFA) 108 (90 BASE) MCG/ACT inhaler, Inhale 2 puffs into the lungs every 6 (six) hours as needed for wheezing or shortness of breath., Disp: 1 Inhaler, Rfl: 2 .  Dexlansoprazole (DEXILANT) 30 MG capsule, Take 30 mg by mouth daily., Disp: , Rfl:  .  FLUoxetine (PROZAC) 10 MG capsule, Take 1 capsule (10 mg total) by mouth daily., Disp: 30 capsule, Rfl: 2 .  hydrochlorothiazide (HYDRODIURIL) 12.5 MG tablet, Take 1 tablet (12.5 mg total) by mouth daily., Disp: 90 tablet, Rfl: 3 .  levocetirizine (XYZAL) 5 MG tablet, Take 5 mg by mouth at bedtime., Disp: , Rfl:  .  losartan (COZAAR) 50 MG tablet, TAKE 1 TABLET BY MOUTH ONCE DAILY, Disp: 30 tablet, Rfl: 5 .  medroxyPROGESTERone (DEPO-PROVERA) 150 MG/ML injection, Inject 150 mg into the muscle every 3 (three) months. , Disp: , Rfl:  .  methotrexate (RHEUMATREX) 2.5 MG tablet, Take 22.5 mg by mouth every Monday. Caution:Chemotherapy. Protect from light. Take 9 tabs po weekly with meals, Disp: , Rfl:  .  montelukast (SINGULAIR) 10 MG tablet, take 1 tablet by mouth at bedtime, Disp: 30 tablet, Rfl: 3 .  OLUMIANT 2 MG TABS, , Disp: , Rfl:  .  valACYclovir (VALTREX) 1000 MG tablet, Take 1 tablet (1,000 mg total) by mouth 2 (two) times daily., Disp: 20 tablet, Rfl: 1  EXAM:  VITALS per patient if applicable: blood pressure checked by pt 108/70.    GENERAL: alert, oriented, appears well and in no acute distress  HEENT: atraumatic, conjunttiva clear, no obvious abnormalities on inspection of external nose and ears  NECK: normal movements of the head and neck  LUNGS: on inspection no signs of respiratory distress, breathing rate appears normal, no obvious gross SOB, gasping or wheezing  CV: no obvious cyanosis  PSYCH/NEURO: pleasant and cooperative, no obvious depression or anxiety, speech and thought processing grossly  intact  ASSESSMENT AND PLAN:  Discussed the following assessment and plan:  Anemia, unspecified type  Anxiety  Environmental allergies  Essential hypertension, benign  Family history of breast cancer  Gastroesophageal reflux disease, esophagitis presence not specified  Hypercholesterolemia  Hypothyroidism, unspecified type  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   Anxiety Off zoloft secondary to intolerance.  Discussed her anxiety.  Also with some depression symptoms.  No suicidal ideations.  Will start prozac.  Follow closely.  Schedule f/u appt soon to assess how she is doing.    Environmental allergies Seeing an allergist.  Continue current medication regimen.  Stable.    Essential hypertension, benign Blood pressure as outlined.  Running a little low.  Hold hctz.  Follow pressures.  Call with readings in a few weeks.  Follow metabolic panel.   Family history of breast cancer Had thickening left axilla.  Had recent left breast mammogram and ultrasound - ok.  Recommended f/u bilateral mammogram in 01/2019.  Has a f/u with Dr Bary Castilla next week to confirm no further w/up warranted.    GERD (gastroesophageal reflux disease) Controlled on current regimen.    Hypercholesterolemia Low cholesterol diet and exercise.  Follow lipid panel.    Hypothyroidism On thyroid replacement.  Follow tsh.   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  Recommended f/u in one year.    Rheumatoid arthritis Followed by Dr Jefm Bryant.  Stable.    Vitamin D deficiency Follow vitamin D level.      I discussed the assessment and treatment plan with the patient. The patient was provided an opportunity to ask questions and all were answered. The patient agreed with the plan and  demonstrated an understanding of the instructions.   The patient was advised to call back or seek an in-person evaluation if the symptoms worsen or if the condition fails to improve as anticipated.    Einar Pheasant, MD

## 2018-10-17 ENCOUNTER — Encounter: Payer: Self-pay | Admitting: Internal Medicine

## 2018-10-17 NOTE — Assessment & Plan Note (Signed)
Had thickening left axilla.  Had recent left breast mammogram and ultrasound - ok.  Recommended f/u bilateral mammogram in 01/2019.  Has a f/u with Dr Bary Castilla next week to confirm no further w/up warranted.

## 2018-10-17 NOTE — Assessment & Plan Note (Signed)
Followed by Dr Kernodle.  Stable.  

## 2018-10-17 NOTE — Assessment & Plan Note (Signed)
Follow cbc.  

## 2018-10-17 NOTE — Assessment & Plan Note (Signed)
Follow vitamin D level.  

## 2018-10-17 NOTE — Assessment & Plan Note (Signed)
Blood pressure as outlined.  Running a little low.  Hold hctz.  Follow pressures.  Call with readings in a few weeks.  Follow metabolic panel.

## 2018-10-17 NOTE — Assessment & Plan Note (Signed)
Followed by neurosurgery.  Recommended f/u in one year.

## 2018-10-17 NOTE — Assessment & Plan Note (Signed)
Low cholesterol diet and exercise.  Follow lipid panel.   

## 2018-10-17 NOTE — Assessment & Plan Note (Signed)
Off zoloft secondary to intolerance.  Discussed her anxiety.  Also with some depression symptoms.  No suicidal ideations.  Will start prozac.  Follow closely.  Schedule f/u appt soon to assess how she is doing.

## 2018-10-17 NOTE — Assessment & Plan Note (Signed)
On thyroid replacement.  Follow tsh.  

## 2018-10-17 NOTE — Assessment & Plan Note (Signed)
Seeing an allergist.  Continue current medication regimen.  Stable.

## 2018-10-17 NOTE — Assessment & Plan Note (Signed)
Controlled on current regimen.   

## 2018-10-18 ENCOUNTER — Ambulatory Visit: Payer: Self-pay | Admitting: Internal Medicine

## 2018-10-26 ENCOUNTER — Ambulatory Visit (INDEPENDENT_AMBULATORY_CARE_PROVIDER_SITE_OTHER): Payer: 59 | Admitting: General Surgery

## 2018-10-26 ENCOUNTER — Encounter: Payer: Self-pay | Admitting: General Surgery

## 2018-10-26 ENCOUNTER — Other Ambulatory Visit: Payer: Self-pay

## 2018-10-26 VITALS — BP 151/88 | HR 96 | Temp 97.8°F | Ht 61.0 in | Wt 174.0 lb

## 2018-10-26 DIAGNOSIS — N6489 Other specified disorders of breast: Secondary | ICD-10-CM | POA: Diagnosis not present

## 2018-10-26 NOTE — Patient Instructions (Signed)
Patient to return as needed, The patient is aware to call back for any questions or concerns.

## 2018-10-26 NOTE — Progress Notes (Signed)
Patient ID: Tina Parker, female   DOB: 1970-11-22, 48 y.o.   MRN: [id number]  Chief Complaint  Patient presents with  . Other    HPI Tina Parker is a 48 y.o. female here today for a evaluation of left breast fullness. She noticed the fullness about four months ago. Mammogram and ultrasound done on 09/06/2018. No pain.Patient does perform self breast checks. No breast issue before and patient did not breast feed. Her mother passes away with breast cancer.  HPI  Past Medical History:  Diagnosis Date  . Allergic rhinitis   . Anemia   . Arthritis    RA  . Carpal tunnel syndrome   . Dysphagia   . Esophageal spasm 01/16/2014  . GERD (gastroesophageal reflux disease)   . Graves disease    remission, no ablation, positive medical treatment  . History of hiatal hernia   . Hypertension   . Migraine headache    migraines  . PPD positive    hepatitis secondary to [facility]  . Pure hypercholesterolemia   . Rheumatoid arthritis(714.0)    positive anti CCP antibodies, positive RF, oligo-articular, MTX    Past Surgical History:  Procedure Laterality Date  . CARPAL TUNNEL RELEASE Right   . CESAREAN SECTION  1996  . COLONOSCOPY  03/14/2002, 08/16/2007, 03/07/2013   [facility]-father  . COLONOSCOPY WITH PROPOFOL N/A 06/21/2018   Procedure: COLONOSCOPY WITH PROPOFOL;  Surgeon: Manya Silvas, MD;  Location[facility] ENDOSCOPY;  Service: Endoscopy;  Laterality: N/A;  . CRANIOTOMY Left 06/25/2017   Procedure: CRANIOTOMY TEMPORAL LEFT FOR TUMOR RESECTION;  Surgeon: Ashok Pall, MD;  Location: [place];  Service: Neurosurgery;  Laterality: Left;  . ESOPHAGOGASTRODUODENOSCOPY  03/14/2002, 03/07/2013  . ESOPHAGOGASTRODUODENOSCOPY (EGD) WITH PROPOFOL N/A 06/21/2018   Procedure: ESOPHAGOGASTRODUODENOSCOPY (EGD) WITH PROPOFOL;  Surgeon: Manya Silvas, MD;  Location: [facility] ENDOSCOPY;  Service: Endoscopy;  Laterality: N/A;  . Post Septoplasty and turbinate reduction  2007  . TRIGGER FINGER RELEASE       Family History  Problem Relation Age of Onset  . Cancer Mother        Breast Cancer  . Breast cancer Mother 19  . Cancer Father        Colon Cancer  . Heart disease Father        Hx of MI  . Hypertension Father   . Diabetes Father   . Cancer Maternal Grandmother        lung cancer  . Cancer Maternal Uncle        esophageal    Social History Social History   Tobacco Use  . Smoking status: Never Smoker  . Smokeless tobacco: Never Used  Substance Use Topics  . Alcohol use: Yes    Alcohol/week: 0.0 standard drinks    Comment: occasionally  . Drug use: No    Allergies  Allergen Reactions  . Inh [Isoniazid] Other (See Comments)    Causes hepatitis    Current Outpatient Medications  Medication Sig Dispense Refill  . albuterol (PROVENTIL HFA;VENTOLIN HFA) 108 (90 BASE) MCG/ACT inhaler Inhale 2 puffs into the lungs every 6 (six) hours as needed for wheezing or shortness of breath. 1 Inhaler 2  . Dexlansoprazole (DEXILANT) 30 MG capsule Take 30 mg by mouth daily.    Marland Kitchen FLUoxetine (PROZAC) 10 MG capsule Take 1 capsule (10 mg total) by mouth daily. 30 capsule 2  . hydrochlorothiazide (HYDRODIURIL) 12.5 MG tablet Take 1 tablet (12.5 mg total) by mouth daily. 90 tablet 3  .  levocetirizine (XYZAL) 5 MG tablet Take 5 mg by mouth at bedtime.    Marland Kitchen losartan (COZAAR) 50 MG tablet TAKE 1 TABLET BY MOUTH ONCE DAILY 30 tablet 5  . medroxyPROGESTERone (DEPO-PROVERA) 150 MG/ML injection Inject 150 mg into the muscle every 3 (three) months.     . methotrexate (RHEUMATREX) 2.5 MG tablet Take 22.5 mg by mouth every Monday. Caution:Chemotherapy. Protect from light. Take 9 tabs po weekly with meals    . montelukast (SINGULAIR) 10 MG tablet take 1 tablet by mouth at bedtime 30 tablet 3  . OLUMIANT 2 MG TABS     . valACYclovir (VALTREX) 1000 MG tablet Take 1 tablet (1,000 mg total) by mouth 2 (two) times daily. 20 tablet 1   No current facility-administered medications for this visit.      Review of Systems Review of Systems  Constitutional: Negative.   Respiratory: Negative.   Cardiovascular: Negative.     Blood pressure (!) 151/88, pulse 96, temperature 97.8 F (36.6 C), temperature source Skin, height 5\' 1"  (1.549 m), weight 174 lb (78.9 kg), SpO2 99 %.  Physical Exam Physical Exam Exam conducted with a chaperone present.  Constitutional:      Appearance: She is well-developed.  Eyes:     General: No scleral icterus.    Conjunctiva/sclera: Conjunctivae normal.  Neck:     Musculoskeletal: Neck supple.  Cardiovascular:     Rate and Rhythm: Normal rate and regular rhythm.     Heart sounds: Normal heart sounds.  Pulmonary:     Effort: Pulmonary effort is normal.     Breath sounds: Normal breath sounds.  Chest:     Breasts:        Right: No inverted nipple, mass, nipple discharge, skin change or tenderness.        Left: No inverted nipple, mass, nipple discharge, skin change or tenderness.    Lymphadenopathy:     Cervical: No cervical adenopathy.     Upper Body:     Right upper body: No supraclavicular or axillary adenopathy.     Left upper body: No supraclavicular or axillary adenopathy.  Skin:    General: Skin is warm and dry.  Neurological:     Mental Status: She is alert and oriented to person, place, and time.     Data Reviewed Left breast diagnostic mammogram and accompanying ultrasound of September 06, 2018 were independently reviewed.  No clear-cut abnormality, no tissue distortion, no mass or cystic lesions.  BI-RADS-2.  These films were reviewed in conjunction with the February 15, 2018 bilateral screening mammograms.  Assessment Modest axillary asymmetry without evidence of adenopathy or breast mass.  Plan Observation alone at present unless the area enlarges, becomes painful or interferes with activities.  Patient to return as needed, The patient is aware to call back for any questions or concerns.  HPI, Physical Exam, Assessment and  Plan have been scribed under the direction and in the presence of Hervey Ard, MD.  Gaspar Cola, CMA  I have completed the exam and reviewed the above documentation for accuracy and completeness.  I agree with the above.  Haematologist has been used and any errors in dictation or transcription are unintentional.  Hervey Ard, M.D., F.A.C.S.  Forest Gleason Trish Mancinelli 10/28/2018, 3:10 PM

## 2018-10-27 DIAGNOSIS — M654 Radial styloid tenosynovitis [de Quervain]: Secondary | ICD-10-CM | POA: Diagnosis not present

## 2018-10-27 DIAGNOSIS — M79645 Pain in left finger(s): Secondary | ICD-10-CM | POA: Diagnosis not present

## 2018-11-30 ENCOUNTER — Ambulatory Visit (INDEPENDENT_AMBULATORY_CARE_PROVIDER_SITE_OTHER): Payer: 59

## 2018-11-30 ENCOUNTER — Ambulatory Visit: Payer: Self-pay

## 2018-11-30 ENCOUNTER — Other Ambulatory Visit: Payer: Self-pay

## 2018-11-30 DIAGNOSIS — Z3042 Encounter for surveillance of injectable contraceptive: Secondary | ICD-10-CM

## 2018-11-30 MED ORDER — MEDROXYPROGESTERONE ACETATE 150 MG/ML IM SUSP
150.0000 mg | Freq: Once | INTRAMUSCULAR | Status: AC
  Administered 2018-11-30: 150 mg via INTRAMUSCULAR

## 2018-11-30 NOTE — Progress Notes (Addendum)
Depo given in upper right outer quad, patient tolerated well.  Reviewed.  Dr Nicki Reaper

## 2018-12-07 ENCOUNTER — Encounter: Payer: Self-pay | Admitting: Internal Medicine

## 2018-12-08 NOTE — Telephone Encounter (Signed)
See if she can do a virtual visit on Tuesday.  If not, let me know and I will find an opening for her.

## 2018-12-08 NOTE — Telephone Encounter (Signed)
Pt scheduled for Tuesday 6/16 virtual visit. Will monitor BP until appt.

## 2018-12-14 ENCOUNTER — Other Ambulatory Visit: Payer: Self-pay

## 2018-12-14 ENCOUNTER — Encounter: Payer: Self-pay | Admitting: Internal Medicine

## 2018-12-14 ENCOUNTER — Telehealth: Payer: Self-pay | Admitting: Internal Medicine

## 2018-12-14 ENCOUNTER — Ambulatory Visit (INDEPENDENT_AMBULATORY_CARE_PROVIDER_SITE_OTHER): Payer: 59 | Admitting: Internal Medicine

## 2018-12-14 DIAGNOSIS — F419 Anxiety disorder, unspecified: Secondary | ICD-10-CM | POA: Diagnosis not present

## 2018-12-14 DIAGNOSIS — I10 Essential (primary) hypertension: Secondary | ICD-10-CM | POA: Diagnosis not present

## 2018-12-14 DIAGNOSIS — K219 Gastro-esophageal reflux disease without esophagitis: Secondary | ICD-10-CM

## 2018-12-14 DIAGNOSIS — Z9109 Other allergy status, other than to drugs and biological substances: Secondary | ICD-10-CM

## 2018-12-14 MED ORDER — FLUOXETINE HCL 20 MG PO TABS: 20.0000 mg | ORAL_TABLET | Freq: Every day | ORAL | 3 refills | Status: DC

## 2018-12-14 NOTE — Telephone Encounter (Signed)
My chart message sent to pt regarding update.  

## 2018-12-14 NOTE — Progress Notes (Signed)
Patient ID: Tina Parker, female   DOB: 12/12/1970, 48 y.o.   MRN: [id number]   Virtual Visit via video Note  This visit type was conducted due to national recommendations for restrictions regarding the COVID-19 pandemic (e.g. social distancing).  This format is felt to be most appropriate for this patient at this time.  All issues noted in this document were discussed and addressed.  No physical exam was performed (except for noted visual exam findings with Video Visits).   I connected with [employer] by a video enabled telemedicine application and verified that I am speaking with the correct person using two identifiers. Location patient: work Location provider: work  Persons participating in the virtual visit: patient, provider  I discussed the limitations, risks, security and privacy concerns of performing an evaluation and management service by video and the availability of in person appointments.  The patient expressed understanding and agreed to proceed.   Reason for visit:  Acute visit.   HPI: She saw Dr Tina Parker 12/06/18 and was found to have elevated blood pressure:  160/98.  Recheck prior to leaving 154/90.  Rechecked when returned home and has been following.  Took hctz x 3 days.  Out of medication now.  Off hctz since 12/09/18.  Blood pressure recently - 106-118/70-80.  No headache.  Feels allergy symptoms stable.  No chest congestion.  No sob.  No fever.  No chest pain or tightness.  No acid reflux.  Reports increased stress.  Stress with returning to work.  Stress with covid.  Stress with "people not wearing mask".  She is back at work.  Discussed with her today.  On prozac.  Felt was helping at first.  Feels may need to adjust dose of medication.  No abdominal pain.  Bowels moving.     ROS: See pertinent positives and negatives per HPI.  Past Medical History:  Diagnosis Date  . Allergic rhinitis   . Anemia   . Arthritis    RA  . Carpal tunnel syndrome   . Dysphagia    . Esophageal spasm 01/16/2014  . GERD (gastroesophageal reflux disease)   . Graves disease    remission, no ablation, positive medical treatment  . History of hiatal hernia   . Hypertension   . Migraine headache    migraines  . PPD positive    hepatitis secondary to [place]  . Pure hypercholesterolemia   . Rheumatoid arthritis(714.0)    positive anti CCP antibodies, positive RF, oligo-articular, MTX    Past Surgical History:  Procedure Laterality Date  . CARPAL TUNNEL RELEASE Right   . CESAREAN SECTION  1996  . COLONOSCOPY  03/14/2002, 08/16/2007, 03/07/2013   [facility]-father  . COLONOSCOPY WITH PROPOFOL N/A 06/21/2018   Procedure: COLONOSCOPY WITH PROPOFOL;  Surgeon: Tina Silvas, MD;  Location: [facility] ENDOSCOPY;  Service: Endoscopy;  Laterality: N/A;  . CRANIOTOMY Left 06/25/2017   Procedure: CRANIOTOMY TEMPORAL LEFT FOR TUMOR RESECTION;  Surgeon: Tina Pall, MD;  Location: [place];  Service: Neurosurgery;  Laterality: Left;  . ESOPHAGOGASTRODUODENOSCOPY  03/14/2002, 03/07/2013  . ESOPHAGOGASTRODUODENOSCOPY (EGD) WITH PROPOFOL N/A 06/21/2018   Procedure: ESOPHAGOGASTRODUODENOSCOPY (EGD) WITH PROPOFOL;  Surgeon: Tina Silvas, MD;  Location: [facility] ENDOSCOPY;  Service: Endoscopy;  Laterality: N/A;  . Post Septoplasty and turbinate reduction  2007  . TRIGGER FINGER RELEASE      Family History  Problem Relation Age of Onset  . Cancer Mother        Breast Cancer  . Breast cancer  Mother 21  . Cancer Father        Colon Cancer  . Heart disease Father        Hx of MI  . Hypertension Father   . Diabetes Father   . Cancer Maternal Grandmother        lung cancer  . Cancer Maternal Uncle        esophageal    SOCIAL HX: reviewed.    Current Outpatient Medications:  .  albuterol (PROVENTIL HFA;VENTOLIN HFA) 108 (90 BASE) MCG/ACT inhaler, Inhale 2 puffs into the lungs every 6 (six) hours as needed for wheezing or shortness of breath., Disp: 1 Inhaler, Rfl: 2 .  Dexlansoprazole  (DEXILANT) 30 MG capsule, Take 30 mg by mouth daily., Disp: , Rfl:  .  FLUoxetine (PROZAC) 20 MG tablet, Take 1 tablet (20 mg total) by mouth daily., Disp: 30 tablet, Rfl: 3 .  hydrochlorothiazide (HYDRODIURIL) 12.5 MG tablet, Take 1 tablet (12.5 mg total) by mouth daily., Disp: 90 tablet, Rfl: 3 .  levocetirizine (XYZAL) 5 MG tablet, Take 5 mg by mouth at bedtime., Disp: , Rfl:  .  losartan (COZAAR) 50 MG tablet, TAKE 1 TABLET BY MOUTH ONCE DAILY, Disp: 30 tablet, Rfl: 5 .  medroxyPROGESTERone (DEPO-PROVERA) 150 MG/ML injection, Inject 150 mg into the muscle every 3 (three) months. , Disp: , Rfl:  .  methotrexate (RHEUMATREX) 2.5 MG tablet, Take 22.5 mg by mouth every Monday. Caution:Chemotherapy. Protect from light. Take 9 tabs po weekly with meals, Disp: , Rfl:  .  montelukast (SINGULAIR) 10 MG tablet, take 1 tablet by mouth at bedtime, Disp: 30 tablet, Rfl: 3 .  OLUMIANT 2 MG TABS, , Disp: , Rfl:  .  valACYclovir (VALTREX) 1000 MG tablet, Take 1 tablet (1,000 mg total) by mouth 2 (two) times daily., Disp: 20 tablet, Rfl: 1  EXAM:  VITALS per patient if applicable: 027/74  GENERAL: alert, oriented, appears well and in no acute distress  HEENT: atraumatic, conjunttiva clear, no obvious abnormalities on inspection of external nose and ears  NECK: normal movements of the head and neck  LUNGS: on inspection no signs of respiratory distress, breathing rate appears normal, no obvious gross SOB, gasping or wheezing  CV: no obvious cyanosis  PSYCH/NEURO: pleasant and cooperative, no obvious depression or anxiety, speech and thought processing grossly intact  ASSESSMENT AND PLAN:  Discussed the following assessment and plan:  Anxiety Increased stress and anxiety.  Intolerance to zoloft.  Tolerating prozac.  Will increase prozac to 20mg  q day.  She declines counseling.  Does not feel needs.  Follow closely.  Schedule f/u soon to reassess.    Environmental allergies Controlled on current  regimen.  Follow.    Essential hypertension, benign Blood pressure as outlined.  Improved now.  Off hctz now.  Hold on additional medication since blood pressure doing well.  Follow pressures.  Follow metabolic panel.   GERD (gastroesophageal reflux disease) Controlled on current regimen.      I discussed the assessment and treatment plan with the patient. The patient was provided an opportunity to ask questions and all were answered. The patient agreed with the plan and demonstrated an understanding of the instructions.   The patient was advised to call back or seek an in-person evaluation if the symptoms worsen or if the condition fails to improve as anticipated.   Einar Pheasant, MD

## 2018-12-15 ENCOUNTER — Encounter: Payer: Self-pay | Admitting: Internal Medicine

## 2018-12-15 NOTE — Assessment & Plan Note (Signed)
Controlled on current regimen.  Follow.  

## 2018-12-15 NOTE — Assessment & Plan Note (Signed)
Controlled on current regimen.   

## 2018-12-15 NOTE — Assessment & Plan Note (Signed)
Increased stress and anxiety.  Intolerance to zoloft.  Tolerating prozac.  Will increase prozac to 20mg  q day.  She declines counseling.  Does not feel needs.  Follow closely.  Schedule f/u soon to reassess.

## 2018-12-15 NOTE — Assessment & Plan Note (Signed)
Blood pressure as outlined.  Improved now.  Off hctz now.  Hold on additional medication since blood pressure doing well.  Follow pressures.  Follow metabolic panel.

## 2018-12-29 ENCOUNTER — Ambulatory Visit: Payer: Self-pay | Admitting: Internal Medicine

## 2019-01-27 ENCOUNTER — Encounter: Payer: Self-pay | Admitting: Internal Medicine

## 2019-01-28 NOTE — Telephone Encounter (Signed)
Patient has no acute Issues scheduled virtual for 02/08/19 advised BP worsens or has symptoms needs to be evaluated sooner.

## 2019-01-28 NOTE — Telephone Encounter (Signed)
Confirm no acute issues.  If blood pressure elevated, increased stress, etc - can schedule an appt (virtual) to discuss.  Let me know if need to do something more urgent.

## 2019-02-08 ENCOUNTER — Ambulatory Visit (INDEPENDENT_AMBULATORY_CARE_PROVIDER_SITE_OTHER): Payer: 59 | Admitting: Internal Medicine

## 2019-02-08 ENCOUNTER — Other Ambulatory Visit: Payer: Self-pay

## 2019-02-08 DIAGNOSIS — D329 Benign neoplasm of meninges, unspecified: Secondary | ICD-10-CM

## 2019-02-08 DIAGNOSIS — E7800 Pure hypercholesterolemia, unspecified: Secondary | ICD-10-CM

## 2019-02-08 DIAGNOSIS — F419 Anxiety disorder, unspecified: Secondary | ICD-10-CM | POA: Diagnosis not present

## 2019-02-08 DIAGNOSIS — K219 Gastro-esophageal reflux disease without esophagitis: Secondary | ICD-10-CM | POA: Diagnosis not present

## 2019-02-08 DIAGNOSIS — I10 Essential (primary) hypertension: Secondary | ICD-10-CM | POA: Diagnosis not present

## 2019-02-08 DIAGNOSIS — D649 Anemia, unspecified: Secondary | ICD-10-CM

## 2019-02-08 DIAGNOSIS — M059 Rheumatoid arthritis with rheumatoid factor, unspecified: Secondary | ICD-10-CM

## 2019-02-08 DIAGNOSIS — E039 Hypothyroidism, unspecified: Secondary | ICD-10-CM

## 2019-02-08 MED ORDER — CITALOPRAM HYDROBROMIDE 10 MG PO TABS: ORAL_TABLET | ORAL | 1 refills | Status: DC

## 2019-02-08 MED ORDER — HYDROCHLOROTHIAZIDE 12.5 MG PO TABS: 12.5000 mg | ORAL_TABLET | Freq: Every day | ORAL | 1 refills | Status: DC

## 2019-02-08 MED ORDER — LOSARTAN POTASSIUM 50 MG PO TABS: 50.0000 mg | ORAL_TABLET | Freq: Every day | ORAL | 1 refills | Status: DC

## 2019-02-08 NOTE — Progress Notes (Signed)
Patient ID: Tina Parker, female   DOB: [birth year], 48 y.o.   MRN: [id number]   Virtual Visit via video Note  This visit type was conducted due to national recommendations for restrictions regarding the COVID-19 pandemic (e.g. social distancing).  This format is felt to be most appropriate for this patient at this time.  All issues noted in this document were discussed and addressed.  No physical exam was performed (except for noted visual exam findings with Video Visits).   I connected with [employer] by a video enabled telemedicine application or telephone and verified that I am speaking with the correct person using two identifiers. Location patient: home Location provider: work or home office Persons participating in the virtual visit: patient, provider  I discussed the limitations, risks, security and privacy concerns of performing an evaluation and management service by video and the availability of in person appointments. The patient expressed understanding and agreed to proceed.   Reason for visit: scheduled follow up.   HPI: She reports persistent increased stress and anxiety.  Increased stress with work and covid.  Discussed with her today.  She does not feel the prozac is helping.  Had intolerance to zoloft.  Discussed changing medication.  She is in agreement.  Also report elevated blood pressure.  States last check 140/96.  Taking losartan.  Off hctz.  Stopped taking previously because blood pressure under good control.  No chest pain.  No sob.  No acid reflux. No abdominal pain.  Bowels moving.     ROS: See pertinent positives and negatives per HPI.  Past Medical History:  Diagnosis Date  . Allergic rhinitis   . Anemia   . Arthritis    RA  . Carpal tunnel syndrome   . Dysphagia   . Esophageal spasm 01/16/2014  . GERD (gastroesophageal reflux disease)   . Graves disease    remission, no ablation, positive medical treatment  . History of hiatal hernia   .  Hypertension   . Migraine headache    migraines  . PPD positive    hepatitis secondary to [place]  . Pure hypercholesterolemia   . Rheumatoid arthritis(714.0)    positive anti CCP antibodies, positive RF, oligo-articular, MTX    Past Surgical History:  Procedure Laterality Date  . CARPAL TUNNEL RELEASE Right   . CESAREAN SECTION  1996  . COLONOSCOPY  03/14/2002, 08/16/2007, 03/07/2013   [facility]-father  . COLONOSCOPY WITH PROPOFOL N/A 06/21/2018   Procedure: COLONOSCOPY WITH PROPOFOL;  Surgeon: Manya Silvas, MD;  Location: [facility] ENDOSCOPY;  Service: Endoscopy;  Laterality: N/A;  . CRANIOTOMY Left 06/25/2017   Procedure: CRANIOTOMY TEMPORAL LEFT FOR TUMOR RESECTION;  Surgeon: Ashok Pall, MD;  Location: [place];  Service: Neurosurgery;  Laterality: Left;  . ESOPHAGOGASTRODUODENOSCOPY  03/14/2002, 03/07/2013  . ESOPHAGOGASTRODUODENOSCOPY (EGD) WITH PROPOFOL N/A 06/21/2018   Procedure: ESOPHAGOGASTRODUODENOSCOPY (EGD) WITH PROPOFOL;  Surgeon: Manya Silvas, MD;  Location: [facility] ENDOSCOPY;  Service: Endoscopy;  Laterality: N/A;  . Post Septoplasty and turbinate reduction  2007  . TRIGGER FINGER RELEASE      Family History  Problem Relation Age of Onset  . Cancer Mother        Breast Cancer  . Breast cancer Mother 15  . Cancer Father        Colon Cancer  . Heart disease Father        Hx of MI  . Hypertension Father   . Diabetes Father   . Cancer Maternal Grandmother  lung cancer  . Cancer Maternal Uncle        esophageal    SOCIAL HX: reviewed.    Current Outpatient Medications:  .  albuterol (PROVENTIL HFA;VENTOLIN HFA) 108 (90 BASE) MCG/ACT inhaler, Inhale 2 puffs into the lungs every 6 (six) hours as needed for wheezing or shortness of breath., Disp: 1 Inhaler, Rfl: 2 .  citalopram (CELEXA) 10 MG tablet, Take one tablet per day for one week and then increase to 2 tablets per day, Disp: 60 tablet, Rfl: 1 .  Dexlansoprazole (DEXILANT) 30 MG capsule, Take 30 mg by mouth  daily., Disp: , Rfl:  .  hydrochlorothiazide (HYDRODIURIL) 12.5 MG tablet, Take 1 tablet (12.5 mg total) by mouth daily., Disp: 90 tablet, Rfl: 1 .  levocetirizine (XYZAL) 5 MG tablet, Take 5 mg by mouth at bedtime., Disp: , Rfl:  .  losartan (COZAAR) 50 MG tablet, Take 1 tablet (50 mg total) by mouth daily., Disp: 90 tablet, Rfl: 1 .  medroxyPROGESTERone (DEPO-PROVERA) 150 MG/ML injection, Inject 150 mg into the muscle every 3 (three) months. , Disp: , Rfl:  .  methotrexate (RHEUMATREX) 2.5 MG tablet, Take 22.5 mg by mouth every Monday. Caution:Chemotherapy. Protect from light. Take 9 tabs po weekly with meals, Disp: , Rfl:  .  montelukast (SINGULAIR) 10 MG tablet, take 1 tablet by mouth at bedtime, Disp: 30 tablet, Rfl: 3 .  OLUMIANT 2 MG TABS, , Disp: , Rfl:  .  valACYclovir (VALTREX) 1000 MG tablet, Take 1 tablet (1,000 mg total) by mouth 2 (two) times daily., Disp: 20 tablet, Rfl: 1  EXAM:  VITALS per patient if applicable: 696/78  GENERAL: alert, oriented, appears well and in no acute distress  HEENT: atraumatic, conjunttiva clear, no obvious abnormalities on inspection of external nose and ears  NECK: normal movements of the head and neck  LUNGS: on inspection no signs of respiratory distress, breathing rate appears normal, no obvious gross SOB, gasping or wheezing  CV: no obvious cyanosis  PSYCH/NEURO: pleasant and cooperative, no obvious depression or anxiety, speech and thought processing grossly intact  ASSESSMENT AND PLAN:  Discussed the following assessment and plan:  Anemia Follow cbc.   Anxiety Increased stress and anxiety as outlined.  Intolerant to zoloft.  Does not feel prozac is working.  Discussed counseling.  She declines.  Has good support.  Discussed treatment change.  Start celexa 10mg  q day.  Titrate up if needed.  Follow closely.  She will keep me posted.    Essential hypertension, benign Blood pressure elevated.  On losaran.  Restart hctz.  Follow  pressures.  Follow metabolic panel.   GERD (gastroesophageal reflux disease) Controlled on current regimen.  Follow.    Hypercholesterolemia Low cholesterol diet and exercise.  Follow lipid panel.   Hypothyroidism On thyroid replacement.  Follow tsh.   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  Recommended f/u in one year.   Rheumatoid arthritis Followed by Dr Jefm Bryant.  Stable.      I discussed the assessment and treatment plan with the patient. The patient was provided an opportunity to ask questions and all were answered. The patient agreed with the plan and demonstrated an understanding of the instructions.   The patient was advised to call back or seek an in-person evaluation if the symptoms worsen or if the condition fails to improve as anticipated.   Einar Pheasant, MD

## 2019-02-13 ENCOUNTER — Encounter: Payer: Self-pay | Admitting: Internal Medicine

## 2019-02-13 NOTE — Assessment & Plan Note (Signed)
Low cholesterol diet and exercise.  Follow lipid panel.   

## 2019-02-13 NOTE — Assessment & Plan Note (Signed)
Blood pressure elevated.  On losaran.  Restart hctz.  Follow pressures.  Follow metabolic panel.

## 2019-02-13 NOTE — Assessment & Plan Note (Signed)
Followed by Dr Kernodle.  Stable.  

## 2019-02-13 NOTE — Assessment & Plan Note (Signed)
Controlled on current regimen.  Follow.  

## 2019-02-13 NOTE — Assessment & Plan Note (Signed)
Follow cbc.  

## 2019-02-13 NOTE — Assessment & Plan Note (Signed)
Increased stress and anxiety as outlined.  Intolerant to zoloft.  Does not feel prozac is working.  Discussed counseling.  She declines.  Has good support.  Discussed treatment change.  Start celexa 10mg  q day.  Titrate up if needed.  Follow closely.  She will keep me posted.

## 2019-02-13 NOTE — Assessment & Plan Note (Signed)
Followed by neurosurgery.  Recommended f/u in one year.

## 2019-02-13 NOTE — Assessment & Plan Note (Signed)
On thyroid replacement.  Follow tsh.  

## 2019-02-15 ENCOUNTER — Telehealth: Payer: Self-pay | Admitting: *Deleted

## 2019-02-15 ENCOUNTER — Ambulatory Visit: Payer: 59

## 2019-02-15 NOTE — Telephone Encounter (Signed)
Depo rescheduled

## 2019-02-15 NOTE — Telephone Encounter (Signed)
Copied from [place] [id number]. Topic: Appointment Scheduling - Scheduling Inquiry for Clinic >> Feb 15, 2019  7:50 AM Rayann Heman wrote: Reason for CRM: pt called and stated that she will need to reschedule depo appointment. Please advise

## 2019-02-21 DIAGNOSIS — M25532 Pain in left wrist: Secondary | ICD-10-CM | POA: Insufficient documentation

## 2019-02-21 DIAGNOSIS — M25531 Pain in right wrist: Secondary | ICD-10-CM | POA: Insufficient documentation

## 2019-02-22 ENCOUNTER — Other Ambulatory Visit: Payer: Self-pay

## 2019-02-22 ENCOUNTER — Ambulatory Visit (INDEPENDENT_AMBULATORY_CARE_PROVIDER_SITE_OTHER): Payer: 59

## 2019-02-22 DIAGNOSIS — Z3042 Encounter for surveillance of injectable contraceptive: Secondary | ICD-10-CM

## 2019-02-22 MED ORDER — MEDROXYPROGESTERONE ACETATE 150 MG/ML IM SUSP
150.0000 mg | Freq: Once | INTRAMUSCULAR | Status: AC
  Administered 2019-02-22: 150 mg via INTRAMUSCULAR

## 2019-02-22 NOTE — Progress Notes (Signed)
Patient presented today for MedroxyProgesterone (Depo-Provera) injection.  Administered IM in left upper outer quadrant of gluteal.  Patient tolerated well with no signs of distress.  Patient given calendar with date to return for next injection between Nov. 10- Nov. 24.

## 2019-03-01 ENCOUNTER — Telehealth: Payer: Self-pay | Admitting: Physical Medicine and Rehabilitation

## 2019-03-01 NOTE — Telephone Encounter (Signed)
Ok if nothing new etc.

## 2019-03-02 NOTE — Telephone Encounter (Signed)
Is auth needed for [phone]? Scheduled for 9/21 with driver.

## 2019-03-02 NOTE — Telephone Encounter (Signed)
Notification or Prior Authorization is not required for the requested services  This [employer] plan does not currently require a prior authorization for [phone]  Decision ID [id number]

## 2019-03-21 ENCOUNTER — Other Ambulatory Visit: Payer: Self-pay

## 2019-03-21 ENCOUNTER — Ambulatory Visit (INDEPENDENT_AMBULATORY_CARE_PROVIDER_SITE_OTHER): Payer: 59 | Admitting: Physical Medicine and Rehabilitation

## 2019-03-21 ENCOUNTER — Ambulatory Visit: Payer: Self-pay

## 2019-03-21 ENCOUNTER — Encounter: Payer: Self-pay | Admitting: Physical Medicine and Rehabilitation

## 2019-03-21 VITALS — BP 146/94 | HR 104

## 2019-03-21 DIAGNOSIS — M5116 Intervertebral disc disorders with radiculopathy, lumbar region: Secondary | ICD-10-CM | POA: Diagnosis not present

## 2019-03-21 DIAGNOSIS — M059 Rheumatoid arthritis with rheumatoid factor, unspecified: Secondary | ICD-10-CM | POA: Diagnosis not present

## 2019-03-21 DIAGNOSIS — M533 Sacrococcygeal disorders, not elsewhere classified: Secondary | ICD-10-CM

## 2019-03-21 DIAGNOSIS — M5416 Radiculopathy, lumbar region: Secondary | ICD-10-CM

## 2019-03-21 DIAGNOSIS — M5442 Lumbago with sciatica, left side: Secondary | ICD-10-CM

## 2019-03-21 DIAGNOSIS — G8929 Other chronic pain: Secondary | ICD-10-CM

## 2019-03-21 MED ORDER — BETAMETHASONE SOD PHOS & ACET 6 (3-3) MG/ML IJ SUSP: 12.0000 mg | Freq: Once | INTRAMUSCULAR | Status: DC

## 2019-03-21 NOTE — Progress Notes (Signed)
 .  Numeric Pain Rating Scale and Functional Assessment Average Pain 6   In the last MONTH (on 0-10 scale) has pain interfered with the following?  1. General activity like being  able to carry out your everyday physical activities such as walking, climbing stairs, carrying groceries, or moving a chair?  Rating(6)   +Driver, -BT, -Dye Allergies.  

## 2019-03-22 NOTE — Procedures (Signed)
S1 Lumbosacral Transforaminal Epidural Steroid Injection - Sub-Pedicular Approach with Fluoroscopic Guidance   Patient: Tina Parker      Date of Birth: [birth year] MRN: [id number] PCP: Einar Pheasant, MD      Visit Date: 03/21/2019   Universal Protocol:    Date/Time: 09/22/205:27 AM  Consent Given By: the patient  Position:  PRONE  Additional Comments: Vital signs were monitored before and after the procedure. Patient was prepped and draped in the usual sterile fashion. The correct patient, procedure, and site was verified.   Injection Procedure Details:  Procedure Site One Meds Administered:  Meds ordered this encounter  Medications  . betamethasone acetate-betamethasone sodium phosphate (CELESTONE) injection 12 mg    Laterality: Left  Location/Site:  S1 Foramen   Needle size: 22 ga.  Needle type: Spinal  Needle Placement: Transforaminal  Findings:   -Comments: Excellent flow of contrast along the nerve and into the epidural space.  Procedure Details: After squaring off the sacral end-plate to get a true AP view, the C-arm was positioned so that the best possible view of the S1 foramen was visualized. The soft tissues overlying this structure were infiltrated with 2-3 ml. of 1% Lidocaine without Epinephrine.    The spinal needle was inserted toward the target using a "trajectory" view along the fluoroscope beam.  Under AP and lateral visualization, the needle was advanced so it did not puncture dura. Biplanar projections were used to confirm position. Aspiration was confirmed to be negative for CSF and/or blood. A 1-2 ml. volume of Isovue-250 was injected and flow of contrast was noted at each level. Radiographs were obtained for documentation purposes.   After attaining the desired flow of contrast documented above, a 0.5 to 1.0 ml test dose of 0.25% Marcaine was injected into each respective transforaminal space.  The patient was observed for 90 seconds post  injection.  After no sensory deficits were reported, and normal lower extremity motor function was noted,   the above injectate was administered so that equal amounts of the injectate were placed at each foramen (level) into the transforaminal epidural space.   Additional Comments:  The patient tolerated the procedure well Dressing: Band-Aid with 2 x 2 sterile gauze    Post-procedure details: Patient was observed during the procedure. Post-procedure instructions were reviewed.  Patient left the clinic in stable condition.

## 2019-03-22 NOTE — Progress Notes (Signed)
Tina Parker - 48 y.o. female MRN [id number]  Date of birth: April 19, 1971  Office Visit Note: Visit Date: 03/21/2019 PCP: Einar Pheasant, MD Referred by: Einar Pheasant, MD  Subjective: Chief Complaint  Patient presents with  . Lower Back - Pain  . Left Thigh - Pain   HPI: Tina Parker is a 48 y.o. female who comes in today For reevaluation and management of low back pain and left buttock pain which is chronic and severe.  She rates her pain as a 6 out of 10.  She reports over a month of worsening severe pain and prior to that was actually doing fairly well.  She has most of her pain with walking and turning and getting up from a sitting position.  She has a lot of pain in the center of the lower back as well she does get some pain with prolonged sitting.  She denies any red flag complaints of bowel or bladder dysfunction or paresthesias or focal weakness.  She has had no specific night pain.  She has been using ibuprofen and medications for rheumatoid arthritis without much relief.  By way of review she initially saw Dr. Eduard Roux in our office and MRI of the lumbar spine was performed showing disc protrusion at L5-S1 which was more left paracentral.  Her pain at that time was more right-sided.  We completed bilateral S1 transforaminal injections at the time with good relief.  Since I have seen her she has been evaluated by [facility] and she did bring with her some new x-rays that were performed recently.  These were reviewed with the patient and really do not show anything new.  She has some degenerative disc height loss at L5-S1 and facet arthropathy at L5-S1 but no evidence of any sacroiliac joint sclerosis.  She has had multiple bouts of physical therapy.  Her case is complicated by rheumatoid arthritis.  She states that her rheumatoid arthritis several months ago seem to really flareup and that is when her back and buttock area started bothering her.  Review of Systems   Constitutional: Negative for chills, fever, malaise/fatigue and weight loss.  HENT: Negative for hearing loss and sinus pain.   Eyes: Negative for blurred vision, double vision and photophobia.  Respiratory: Negative for cough and shortness of breath.   Cardiovascular: Negative for chest pain, palpitations and leg swelling.  Gastrointestinal: Negative for abdominal pain, nausea and vomiting.  Genitourinary: Negative for flank pain.  Musculoskeletal: Positive for back pain and joint pain. Negative for myalgias.  Skin: Negative for itching and rash.  Neurological: Negative for tremors, focal weakness and weakness.  Endo/Heme/Allergies: Negative.   Psychiatric/Behavioral: Negative for depression.  All other systems reviewed and are negative.  Otherwise per HPI.  Assessment & Plan: Visit Diagnoses:  1. Lumbar radiculopathy   2. Radiculopathy due to lumbar intervertebral disc disorder   3. Chronic bilateral low back pain with left-sided sciatica   4. Rheumatoid arthritis with positive rheumatoid factor, involving unspecified site ([place])   5. Sacroiliac joint dysfunction     Plan: Findings:  Chronic 6-week history of low back and left buttock pain some referral into the hamstring.  History of lumbar disc herniation at L5-S1 to the left from MRI from 2019.  No new injuries no sprain or strain.  Seems to be a flareup related in particular to flareup of rheumatoid arthritis.  I think this is still related to the disc herniation at L5-S1 and I recommend left S1  transforaminal epidural steroid injection which we can do today do the patient severity of pain.  I cannot rule out that this could be sacroiliac joint dysfunction or sacroiliitis.  No evidence on x-ray recently.  Pain is over the left PSIS.  She does have a positive Fortin finger sign.  Pain is worse with going from sit to stand.  If she does not get much relief with epidural injection would look at diagnostic sacroiliac joint injection.  We  talked about activity modification and exercises.  Should continue with current medication.    Meds & Orders:  Meds ordered this encounter  Medications  . betamethasone acetate-betamethasone sodium phosphate (CELESTONE) injection 12 mg    Orders Placed This Encounter  Procedures  . XR C-ARM NO REPORT  . Epidural Steroid injection    Follow-up: Return if symptoms worsen or fail to improve, for Consider sacroiliac joint dysfunction.   Procedures: No procedures performed  S1 Lumbosacral Transforaminal Epidural Steroid Injection - Sub-Pedicular Approach with Fluoroscopic Guidance   Patient: Tina Parker      Date of Birth: [birth year] MRN: [id number] PCP: Einar Pheasant, MD      Visit Date: 03/21/2019   Universal Protocol:    Date/Time: 09/22/205:27 AM  Consent Given By: the patient  Position:  PRONE  Additional Comments: Vital signs were monitored before and after the procedure. Patient was prepped and draped in the usual sterile fashion. The correct patient, procedure, and site was verified.   Injection Procedure Details:  Procedure Site One Meds Administered:  Meds ordered this encounter  Medications  . betamethasone acetate-betamethasone sodium phosphate (CELESTONE) injection 12 mg    Laterality: Left  Location/Site:  S1 Foramen   Needle size: 22 ga.  Needle type: Spinal  Needle Placement: Transforaminal  Findings:   -Comments: Excellent flow of contrast along the nerve and into the epidural space.  Procedure Details: After squaring off the sacral end-plate to get a true AP view, the C-arm was positioned so that the best possible view of the S1 foramen was visualized. The soft tissues overlying this structure were infiltrated with 2-3 ml. of 1% Lidocaine without Epinephrine.    The spinal needle was inserted toward the target using a "trajectory" view along the fluoroscope beam.  Under AP and lateral visualization, the needle was advanced so it did  not puncture dura. Biplanar projections were used to confirm position. Aspiration was confirmed to be negative for CSF and/or blood. A 1-2 ml. volume of Isovue-250 was injected and flow of contrast was noted at each level. Radiographs were obtained for documentation purposes.   After attaining the desired flow of contrast documented above, a 0.5 to 1.0 ml test dose of 0.25% Marcaine was injected into each respective transforaminal space.  The patient was observed for 90 seconds post injection.  After no sensory deficits were reported, and normal lower extremity motor function was noted,   the above injectate was administered so that equal amounts of the injectate were placed at each foramen (level) into the transforaminal epidural space.   Additional Comments:  The patient tolerated the procedure well Dressing: Band-Aid with 2 x 2 sterile gauze    Post-procedure details: Patient was observed during the procedure. Post-procedure instructions were reviewed.  Patient left the clinic in stable condition.    Clinical History: MRI LUMBAR SPINE WITHOUT CONTRAST  TECHNIQUE: Multiplanar, multisequence MR imaging of the lumbar spine was performed. No intravenous contrast was administered.  COMPARISON:  Right hip MRI 08/14/2017.  FINDINGS:  Segmentation: Lumbar segmentation appears to be normal and will be designated as such for this report.  Alignment: Mild straightening of lower lumbar lordosis. Subtle anterolisthesis of L5 on S1. Subtle retrolisthesis of L3 on L4.  Vertebrae: No marrow edema or evidence of acute osseous abnormality. Visualized bone marrow signal is within normal limits. Intact visible sacrum and SI joints.  Conus medullaris and cauda equina: Conus extends to the L1 level. Conus and cauda equina appear normal.  Paraspinal and other soft tissues: Visualized abdominal viscera and paraspinal soft tissues are within normal limits.  Disc levels:  T11-T12: Disc  desiccation. Small left subarticular disc protrusion (series 6, image 2). Mild involvement of both the left T11 neural foramen and left lateral recess at the descending left T12 nerve level.  T12-L1:  Negative.  L1-L2:  Negative.  L2-L3:  Mild circumferential disc bulge.  No stenosis.  L3-L4: Subtle retrolisthesis. Mild circumferential disc bulge with broad-based bilateral foraminal involvement. Mild bilateral L3 neural foraminal stenosis, perhaps greater on the right. No spinal or lateral recess stenosis.  L4-L5: Mild vertebral endplate spurring. Borderline to mild facet hypertrophy. Borderline to mild bilateral L4 neural foraminal stenosis.  L5-S1: Disc desiccation. Circumferential disc bulge plus broad-based central to slightly left paracentral mildly lobulated disc protrusion or extrusion (series 6, image 40). Superimposed mild facet hypertrophy. Superimposed endplate spurring. No significant spinal stenosis. There is moderate involvement at the descending left S1 nerve roots in the left lateral recess. There is mild involvement of the descending right S1 nerve roots. There is moderate left and mild right L5 neural foraminal stenosis.  IMPRESSION: 1. L5-S1 disc degeneration with disc bulging, endplate spurring, and a superimposed small disc herniation. However, the changes are eccentric to the left. Perhaps the small disc herniation might be a source for right S1 radiculitis. There is mild right L5 neural foraminal stenosis, but no significant lumbar spinal stenosis. 2. Minimal degeneration at L4-L5 with only mild L4 neural foraminal stenosis. Mild degeneration at L3-L4 with mild L3 neural foraminal stenosis.   Electronically Signed   By: Genevie Ann M.D.   On: 09/07/2017 09:51   She reports that she has never smoked. She has never used smokeless tobacco. No results for input(s): HGBA1C, LABURIC in the last 8760 hours.  Objective:  VS:  HT:    WT:   BMI:      BP:(!) 146/94  HR:(!) 104bpm  TEMP: ( )  RESP:  Physical Exam Vitals signs and nursing note reviewed.  Constitutional:      General: She is not in acute distress.    Appearance: Normal appearance. She is well-developed. She is not ill-appearing.  HENT:     Head: Normocephalic and atraumatic.  Eyes:     Conjunctiva/sclera: Conjunctivae normal.     Pupils: Pupils are equal, round, and reactive to light.  Cardiovascular:     Rate and Rhythm: Normal rate.     Pulses: Normal pulses.  Pulmonary:     Effort: Pulmonary effort is normal.  Musculoskeletal:     Right lower leg: No edema.     Left lower leg: No edema.     Comments: Patient goes from sit to stand without great difficulty but does have pain with extension and facet loading.  She does have tenderness over the left PSIS and positive Fortin finger sign.  She has good distal strength without clonus.  Skin:    General: Skin is warm and dry.     Findings: No erythema or rash.  Neurological:     General: No focal deficit present.     Mental Status: She is alert and oriented to person, place, and time.     Sensory: No sensory deficit.     Motor: No abnormal muscle tone.     Coordination: Coordination normal.     Gait: Gait normal.  Psychiatric:        Mood and Affect: Mood normal.        Behavior: Behavior normal.     Ortho Exam Imaging: Xr C-arm No Report  Result Date: 03/21/2019 Please see Notes tab for imaging impression.   Past Medical/Family/Surgical/Social History: Medications & Allergies reviewed per EMR, new medications updated. Patient Active Problem List   Diagnosis Date Noted  . Anxiety 06/08/2018  . Sinusitis 06/08/2018  . History of meningioma of the brain 12/21/2017  . Herpes 07/23/2017  . 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 ([place]) 06/25/2017  . Pain of both hip joints 05/18/2017  . Vitamin D deficiency 09/21/2016  . Cough 01/07/2015  . Difficulty sleeping 01/07/2015  . Health  care maintenance 08/02/2014  . Snoring 03/19/2014  . Dysphagia 01/15/2014  . BMI 32.0-32.9,adult 12/11/2013  . Skin lesion 12/11/2013  . Family history of breast cancer 10/22/2013  . Environmental allergies 10/22/2013  . Essential hypertension, benign 09/15/2012  . Hypercholesterolemia 09/15/2012  . Rheumatoid arthritis ([place]) 09/15/2012  . GERD (gastroesophageal reflux disease) 09/15/2012  . Family history of colon cancer 09/15/2012  . Anemia 09/15/2012  . Hypothyroidism 09/15/2012   Past Medical History:  Diagnosis Date  . Allergic rhinitis   . Anemia   . Arthritis    RA  . Carpal tunnel syndrome   . Dysphagia   . Esophageal spasm 01/16/2014  . GERD (gastroesophageal reflux disease)   . Graves disease    remission, no ablation, positive medical treatment  . History of hiatal hernia   . Hypertension   . Migraine headache    migraines  . PPD positive    hepatitis secondary to [place]  . Pure hypercholesterolemia   . Rheumatoid arthritis(714.0)    positive anti CCP antibodies, positive RF, oligo-articular, MTX   Family History  Problem Relation Age of Onset  . Cancer Mother        Breast Cancer  . Breast cancer Mother 25  . Cancer Father        Colon Cancer  . Heart disease Father        Hx of MI  . Hypertension Father   . Diabetes Father   . Cancer Maternal Grandmother        lung cancer  . Cancer Maternal Uncle        esophageal   Past Surgical History:  Procedure Laterality Date  . CARPAL TUNNEL RELEASE Right   . CESAREAN SECTION  1996  . COLONOSCOPY  03/14/2002, 08/16/2007, 03/07/2013   [facility]-father  . COLONOSCOPY WITH PROPOFOL N/A 06/21/2018   Procedure: COLONOSCOPY WITH PROPOFOL;  Surgeon: Manya Silvas, MD;  Location: [facility] ENDOSCOPY;  Service: Endoscopy;  Laterality: N/A;  . CRANIOTOMY Left 06/25/2017   Procedure: CRANIOTOMY TEMPORAL LEFT FOR TUMOR RESECTION;  Surgeon: Ashok Pall, MD;  Location: [facility];  Service: Neurosurgery;  Laterality: Left;  .  ESOPHAGOGASTRODUODENOSCOPY  03/14/2002, 03/07/2013  . ESOPHAGOGASTRODUODENOSCOPY (EGD) WITH PROPOFOL N/A 06/21/2018   Procedure: ESOPHAGOGASTRODUODENOSCOPY (EGD) WITH PROPOFOL;  Surgeon: Manya Silvas, MD;  Location: [facility] ENDOSCOPY;  Service: Endoscopy;  Laterality: N/A;  . Post Septoplasty and turbinate reduction  2007  .  TRIGGER FINGER RELEASE     Social History   Occupational History  . Not on file  Tobacco Use  . Smoking status: Never Smoker  . Smokeless tobacco: Never Used  Substance and Sexual Activity  . Alcohol use: Yes    Alcohol/week: 0.0 standard drinks    Comment: occasionally  . Drug use: No  . Sexual activity: Not on file

## 2019-03-25 ENCOUNTER — Other Ambulatory Visit: Payer: Self-pay

## 2019-03-25 ENCOUNTER — Ambulatory Visit (INDEPENDENT_AMBULATORY_CARE_PROVIDER_SITE_OTHER): Payer: 59

## 2019-03-25 DIAGNOSIS — Z23 Encounter for immunization: Secondary | ICD-10-CM

## 2019-03-28 ENCOUNTER — Ambulatory Visit: Payer: 59

## 2019-04-13 ENCOUNTER — Other Ambulatory Visit: Payer: Self-pay | Admitting: Internal Medicine

## 2019-04-13 ENCOUNTER — Encounter: Payer: Self-pay | Admitting: Internal Medicine

## 2019-04-13 DIAGNOSIS — Z1231 Encounter for screening mammogram for malignant neoplasm of breast: Secondary | ICD-10-CM

## 2019-04-14 NOTE — Telephone Encounter (Signed)
Pt scheduled  

## 2019-04-14 NOTE — Telephone Encounter (Signed)
Patient has not been exposed that she is aware of and the cough she has comes and goes. She has year round allergies. She has been using her inhaler. Cough is non productive. She said it feels like a tickle in her throat. No other acute symptoms noted. She has used OTC cough syrup and it helps but has not cleared it up. She mentioned that she has used tessalon perles in the past and seemed to work well for her. Pt was wondering if she could try something like that or if you had any other recommendations?

## 2019-04-14 NOTE — Telephone Encounter (Signed)
Please schedule her a doxy appt tomorrow since cough and concern regarding sleep apnea, etc.  I think I have 9:00 spot open.

## 2019-04-15 ENCOUNTER — Other Ambulatory Visit: Payer: Self-pay

## 2019-04-15 ENCOUNTER — Encounter: Payer: Self-pay | Admitting: Internal Medicine

## 2019-04-15 ENCOUNTER — Ambulatory Visit (INDEPENDENT_AMBULATORY_CARE_PROVIDER_SITE_OTHER): Payer: 59 | Admitting: Internal Medicine

## 2019-04-15 DIAGNOSIS — I10 Essential (primary) hypertension: Secondary | ICD-10-CM

## 2019-04-15 DIAGNOSIS — R059 Cough, unspecified: Secondary | ICD-10-CM

## 2019-04-15 DIAGNOSIS — R05 Cough: Secondary | ICD-10-CM

## 2019-04-15 DIAGNOSIS — M059 Rheumatoid arthritis with rheumatoid factor, unspecified: Secondary | ICD-10-CM

## 2019-04-15 DIAGNOSIS — F419 Anxiety disorder, unspecified: Secondary | ICD-10-CM | POA: Diagnosis not present

## 2019-04-15 DIAGNOSIS — K219 Gastro-esophageal reflux disease without esophagitis: Secondary | ICD-10-CM | POA: Diagnosis not present

## 2019-04-15 DIAGNOSIS — R0683 Snoring: Secondary | ICD-10-CM

## 2019-04-15 MED ORDER — BENZONATATE 100 MG PO CAPS: 100.0000 mg | ORAL_CAPSULE | Freq: Three times a day (TID) | ORAL | 0 refills | Status: DC | PRN

## 2019-04-15 NOTE — Progress Notes (Signed)
Patient ID: Tina Parker, female   DOB: September 04, 1970, 48 y.o.   MRN: [id number]   Virtual Visit via video Note  This visit type was conducted due to national recommendations for restrictions regarding the COVID-19 pandemic (e.g. social distancing).  This format is felt to be most appropriate for this patient at this time.  All issues noted in this document were discussed and addressed.  No physical exam was performed (except for noted visual exam findings with Video Visits).   I connected with [employer] by a video enabled telemedicine application and verified that I am speaking with the correct person using two identifiers. Location patient: home Location provider: work Persons participating in the virtual visit: patient, provider  I discussed the limitations, risks, security and privacy concerns of performing an evaluation and management service by video and the availability of in person appointments.  The patient expressed understanding and agreed to proceed.   Reason for visit: work in appt.   HPI: She is concerned regarding sleep apnea.  States her husband has noticed that her snoring has increased.  Some fatigue.  Daytime somnolence.  Also reports coughing.  Wakes up coughing.  Has gained weight.  Deviated septum.  No sinus pressure.  No significant drainage.  Has adjustable bed.  Tries to stay active.  No chest pain.  No sob.  No acid reflux.  No abdominal pain.  Bowels moving.  Taking celexa.  Stable.     ROS: See pertinent positives and negatives per HPI.  Past Medical History:  Diagnosis Date  . Allergic rhinitis   . Anemia   . Arthritis    RA  . Carpal tunnel syndrome   . Dysphagia   . Esophageal spasm 01/16/2014  . GERD (gastroesophageal reflux disease)   . Graves disease    remission, no ablation, positive medical treatment  . History of hiatal hernia   . Hypertension   . Migraine headache    migraines  . PPD positive    hepatitis secondary to [place]  . Pure  hypercholesterolemia   . Rheumatoid arthritis(714.0)    positive anti CCP antibodies, positive RF, oligo-articular, MTX    Past Surgical History:  Procedure Laterality Date  . CARPAL TUNNEL RELEASE Right   . CESAREAN SECTION  1996  . COLONOSCOPY  03/14/2002, 08/16/2007, 03/07/2013   [facility]-father  . COLONOSCOPY WITH PROPOFOL N/A 06/21/2018   Procedure: COLONOSCOPY WITH PROPOFOL;  Surgeon: Manya Silvas, MD;  Location: [facility] ENDOSCOPY;  Service: Endoscopy;  Laterality: N/A;  . CRANIOTOMY Left 06/25/2017   Procedure: CRANIOTOMY TEMPORAL LEFT FOR TUMOR RESECTION;  Surgeon: Ashok Pall, MD;  Location: [facility];  Service: Neurosurgery;  Laterality: Left;  . ESOPHAGOGASTRODUODENOSCOPY  03/14/2002, 03/07/2013  . ESOPHAGOGASTRODUODENOSCOPY (EGD) WITH PROPOFOL N/A 06/21/2018   Procedure: ESOPHAGOGASTRODUODENOSCOPY (EGD) WITH PROPOFOL;  Surgeon: Manya Silvas, MD;  Location: [facility] ENDOSCOPY;  Service: Endoscopy;  Laterality: N/A;  . Post Septoplasty and turbinate reduction  2007  . TRIGGER FINGER RELEASE      Family History  Problem Relation Age of Onset  . Cancer Mother        Breast Cancer  . Breast cancer Mother 3  . Cancer Father        Colon Cancer  . Heart disease Father        Hx of MI  . Hypertension Father   . Diabetes Father   . Cancer Maternal Grandmother        lung cancer  . Cancer Maternal Uncle  esophageal    SOCIAL HX: reviewed.    Current Outpatient Medications:  .  albuterol (PROVENTIL HFA;VENTOLIN HFA) 108 (90 BASE) MCG/ACT inhaler, Inhale 2 puffs into the lungs every 6 (six) hours as needed for wheezing or shortness of breath., Disp: 1 Inhaler, Rfl: 2 .  benzonatate (TESSALON) 100 MG capsule, Take 1 capsule (100 mg total) by mouth 3 (three) times daily as needed for cough., Disp: 21 capsule, Rfl: 0 .  citalopram (CELEXA) 10 MG tablet, Take one tablet per day for one week and then increase to 2 tablets per day, Disp: 60 tablet, Rfl: 1 .  Dexlansoprazole  (DEXILANT) 30 MG capsule, Take 30 mg by mouth daily., Disp: , Rfl:  .  hydrochlorothiazide (HYDRODIURIL) 12.5 MG tablet, Take 1 tablet (12.5 mg total) by mouth daily., Disp: 90 tablet, Rfl: 1 .  levocetirizine (XYZAL) 5 MG tablet, Take 5 mg by mouth at bedtime., Disp: , Rfl:  .  losartan (COZAAR) 50 MG tablet, Take 1 tablet (50 mg total) by mouth daily., Disp: 90 tablet, Rfl: 1 .  medroxyPROGESTERone (DEPO-PROVERA) 150 MG/ML injection, Inject 150 mg into the muscle every 3 (three) months. , Disp: , Rfl:  .  methotrexate (RHEUMATREX) 2.5 MG tablet, Take 22.5 mg by mouth every Monday. Caution:Chemotherapy. Protect from light. Take 9 tabs po weekly with meals, Disp: , Rfl:  .  montelukast (SINGULAIR) 10 MG tablet, take 1 tablet by mouth at bedtime, Disp: 30 tablet, Rfl: 3 .  OLUMIANT 2 MG TABS, , Disp: , Rfl:  .  valACYclovir (VALTREX) 1000 MG tablet, Take 1 tablet (1,000 mg total) by mouth 2 (two) times daily., Disp: 20 tablet, Rfl: 1  Current Facility-Administered Medications:  .  betamethasone acetate-betamethasone sodium phosphate (CELESTONE) injection 12 mg, 12 mg, Other, Once, Magnus Sinning, MD  EXAM:  GENERAL: alert, oriented, appears well and in no acute distress  HEENT: atraumatic, conjunttiva clear, no obvious abnormalities on inspection of external nose and ears  NECK: normal movements of the head and neck  LUNGS: on inspection no signs of respiratory distress, breathing rate appears normal, no obvious gross SOB, gasping or wheezing  CV: no obvious cyanosis  PSYCH/NEURO: pleasant and cooperative, no obvious depression or anxiety, speech and thought processing grossly intact  ASSESSMENT AND PLAN:  Discussed the following assessment and plan:  Anxiety Doing well on citalopram.    Cough Continue singulair, inhalers.  Tessalon perles as directed.  No fever.  No sob.  Continue dexilant.    Essential hypertension, benign Blood pressures doing better.  Spot check pressure.   Schedule f/u.    GERD (gastroesophageal reflux disease) Controlled on dexilant.   Rheumatoid arthritis Followed by Dr Jefm Bryant.  Stable.   Snoring Snoring and increased daytime somnolence.  Concern over possible sleep apnea.  Schedule appt with pulmonary for evaluation for question of need for possible sleep apnea.      I discussed the assessment and treatment plan with the patient. The patient was provided an opportunity to ask questions and all were answered. The patient agreed with the plan and demonstrated an understanding of the instructions.   The patient was advised to call back or seek an in-person evaluation if the symptoms worsen or if the condition fails to improve as anticipated.   Einar Pheasant, MD

## 2019-04-20 ENCOUNTER — Encounter: Payer: Self-pay | Admitting: Internal Medicine

## 2019-04-20 NOTE — Assessment & Plan Note (Signed)
Doing well on citalopram.

## 2019-04-20 NOTE — Assessment & Plan Note (Signed)
Snoring and increased daytime somnolence.  Concern over possible sleep apnea.  Schedule appt with pulmonary for evaluation for question of need for possible sleep apnea.

## 2019-04-20 NOTE — Assessment & Plan Note (Signed)
Blood pressures doing better.  Spot check pressure.  Schedule f/u.

## 2019-04-20 NOTE — Assessment & Plan Note (Signed)
Continue singulair, inhalers.  Tessalon perles as directed.  No fever.  No sob.  Continue dexilant.

## 2019-04-20 NOTE — Assessment & Plan Note (Signed)
Controlled on dexilant.   

## 2019-04-20 NOTE — Assessment & Plan Note (Signed)
Followed by Dr Kernodle.  Stable.  

## 2019-04-25 ENCOUNTER — Ambulatory Visit: Payer: 59 | Admitting: Internal Medicine

## 2019-04-25 ENCOUNTER — Other Ambulatory Visit: Payer: Self-pay

## 2019-04-25 DIAGNOSIS — Z803 Family history of malignant neoplasm of breast: Secondary | ICD-10-CM

## 2019-04-25 DIAGNOSIS — M059 Rheumatoid arthritis with rheumatoid factor, unspecified: Secondary | ICD-10-CM

## 2019-04-25 DIAGNOSIS — I10 Essential (primary) hypertension: Secondary | ICD-10-CM | POA: Diagnosis not present

## 2019-04-25 DIAGNOSIS — E559 Vitamin D deficiency, unspecified: Secondary | ICD-10-CM

## 2019-04-25 DIAGNOSIS — E7800 Pure hypercholesterolemia, unspecified: Secondary | ICD-10-CM

## 2019-04-25 DIAGNOSIS — E039 Hypothyroidism, unspecified: Secondary | ICD-10-CM | POA: Diagnosis not present

## 2019-04-25 DIAGNOSIS — D649 Anemia, unspecified: Secondary | ICD-10-CM

## 2019-04-25 DIAGNOSIS — Z800 Family history of malignant neoplasm of digestive organs: Secondary | ICD-10-CM

## 2019-04-25 DIAGNOSIS — Z9109 Other allergy status, other than to drugs and biological substances: Secondary | ICD-10-CM

## 2019-04-25 DIAGNOSIS — K219 Gastro-esophageal reflux disease without esophagitis: Secondary | ICD-10-CM

## 2019-04-25 DIAGNOSIS — E0789 Other specified disorders of thyroid: Secondary | ICD-10-CM

## 2019-04-25 DIAGNOSIS — F419 Anxiety disorder, unspecified: Secondary | ICD-10-CM

## 2019-04-25 DIAGNOSIS — R1310 Dysphagia, unspecified: Secondary | ICD-10-CM | POA: Diagnosis not present

## 2019-04-25 DIAGNOSIS — D329 Benign neoplasm of meninges, unspecified: Secondary | ICD-10-CM

## 2019-04-25 DIAGNOSIS — R0683 Snoring: Secondary | ICD-10-CM

## 2019-04-25 LAB — COMPREHENSIVE METABOLIC PANEL
ALT: 14 U/L (ref 0–35)
AST: 19 U/L (ref 0–37)
Albumin: 4 g/dL (ref 3.5–5.2)
Alkaline Phosphatase: 128 U/L — ABNORMAL HIGH (ref 39–117)
BUN: 19 mg/dL (ref 6–23)
CO2: 27 mEq/L (ref 19–32)
Calcium: 9.7 mg/dL (ref 8.4–10.5)
Chloride: 104 mEq/L (ref 96–112)
Creatinine, Ser: 0.89 mg/dL (ref 0.40–1.20)
GFR: 81.21 mL/min (ref >60.00)
Glucose, Bld: 107 mg/dL — ABNORMAL HIGH (ref 70–99)
Potassium: 4.1 mEq/L (ref 3.5–5.1)
Sodium: 139 mEq/L (ref 135–145)
Total Bilirubin: 0.5 mg/dL (ref 0.2–1.2)
Total Protein: 7.7 g/dL (ref 6.0–8.3)

## 2019-04-25 LAB — TSH: TSH: 1.97 u[IU]/mL (ref 0.35–4.50)

## 2019-04-25 LAB — CBC WITH DIFFERENTIAL/PLATELET
Basophils Absolute: 0 10*3/uL (ref 0.0–0.1)
Basophils Relative: 0.4 % (ref 0.0–3.0)
Eosinophils Absolute: 0 10*3/uL (ref 0.0–0.7)
Eosinophils Relative: 0.3 % (ref 0.0–5.0)
HCT: 34.2 % — ABNORMAL LOW (ref 36.0–46.0)
Hemoglobin: 11.4 g/dL — ABNORMAL LOW (ref 12.0–15.0)
Lymphocytes Relative: 10.4 % — ABNORMAL LOW (ref 12.0–46.0)
Lymphs Abs: 1 10*3/uL (ref 0.7–4.0)
MCHC: 33.4 g/dL (ref 30.0–36.0)
MCV: 93.8 fl (ref 78.0–100.0)
Monocytes Absolute: 0.5 10*3/uL (ref 0.1–1.0)
Monocytes Relative: 5.3 % (ref 3.0–12.0)
Neutro Abs: 7.9 10*3/uL — ABNORMAL HIGH (ref 1.4–7.7)
Neutrophils Relative %: 83.6 % — ABNORMAL HIGH (ref 43.0–77.0)
Platelets: 402 10*3/uL — ABNORMAL HIGH (ref 150.0–400.0)
RBC: 3.64 Mil/uL — ABNORMAL LOW (ref 3.87–5.11)
RDW: 14.1 % (ref 11.5–15.5)
WBC: 9.4 10*3/uL (ref 4.0–10.5)

## 2019-04-25 LAB — LIPID PANEL
Cholesterol: 210 mg/dL — ABNORMAL HIGH (ref 0–200)
HDL: 62.9 mg/dL (ref >39.00)
LDL Cholesterol: 137 mg/dL — ABNORMAL HIGH (ref 0–99)
NonHDL: 147.32
Total CHOL/HDL Ratio: 3
Triglycerides: 52 mg/dL (ref 0.0–149.0)
VLDL: 10.4 mg/dL (ref 0.0–40.0)

## 2019-04-25 MED ORDER — CITALOPRAM HYDROBROMIDE 20 MG PO TABS: 20.0000 mg | ORAL_TABLET | Freq: Every day | ORAL | 5 refills | Status: DC

## 2019-04-25 NOTE — Progress Notes (Signed)
Patient ID: Tina Parker, female   DOB: 16-Nov-1970, 48 y.o.   MRN: [id number]   Subjective:    Patient ID: Tina Parker, female    DOB: 03-Jun-1971, 48 y.o.   MRN: [id number]  HPI  Patient here for a scheduled follow up.   Had to come off olumiant for 3 weeks in august secondary to dental abscess.  Was on abx.  Since then has had increased joint pain (hands, knees, etc).  Sees Dr Jefm Bryant. Note reviewed.  Was given prednisone taper.  Planning to start Actemra - after precertification complete.  We previously discussed concern regarding sleep apnea.  She has not heard about scheduling.  Will f/u.  No chest pain.  Allergy symptoms appeared to be under reasonable control.  She does report persistent problems with dysphagia.  Having trouble swallowing at times.  Some fullness.  Discussed f/u with GI.  She is in agreement.  No significant acid reflux.  On dexilant.  No abdominal pain.  Bowels moving.  Appears to be handling stress relatively well.  Reports doing better on citalopram.     Past Medical History:  Diagnosis Date  . Allergic rhinitis   . Anemia   . Arthritis    RA  . Carpal tunnel syndrome   . Dysphagia   . Esophageal spasm 01/16/2014  . GERD (gastroesophageal reflux disease)   . Graves disease    remission, no ablation, positive medical treatment  . History of hiatal hernia   . Hypertension   . Migraine headache    migraines  . PPD positive    hepatitis secondary to [place]  . Pure hypercholesterolemia   . Rheumatoid arthritis(714.0)    positive anti CCP antibodies, positive RF, oligo-articular, MTX   Past Surgical History:  Procedure Laterality Date  . CARPAL TUNNEL RELEASE Right   . CESAREAN SECTION  1996  . COLONOSCOPY  03/14/2002, 08/16/2007, 03/07/2013   [facility]-father  . COLONOSCOPY WITH PROPOFOL N/A 06/21/2018   Procedure: COLONOSCOPY WITH PROPOFOL;  Surgeon: Manya Silvas, MD;  Location: [facility] ENDOSCOPY;  Service: Endoscopy;  Laterality: N/A;  . CRANIOTOMY Left  06/25/2017   Procedure: CRANIOTOMY TEMPORAL LEFT FOR TUMOR RESECTION;  Surgeon: Ashok Pall, MD;  Location: [facility];  Service: Neurosurgery;  Laterality: Left;  . ESOPHAGOGASTRODUODENOSCOPY  03/14/2002, 03/07/2013  . ESOPHAGOGASTRODUODENOSCOPY (EGD) WITH PROPOFOL N/A 06/21/2018   Procedure: ESOPHAGOGASTRODUODENOSCOPY (EGD) WITH PROPOFOL;  Surgeon: Manya Silvas, MD;  Location: [facility] ENDOSCOPY;  Service: Endoscopy;  Laterality: N/A;  . Post Septoplasty and turbinate reduction  2007  . TRIGGER FINGER RELEASE     Family History  Problem Relation Age of Onset  . Cancer Mother        Breast Cancer  . Breast cancer Mother 37  . Cancer Father        Colon Cancer  . Heart disease Father        Hx of MI  . Hypertension Father   . Diabetes Father   . Cancer Maternal Grandmother        lung cancer  . Cancer Maternal Uncle        esophageal   Social History   Socioeconomic History  . Marital status: Married    Spouse name: Not on file  . Number of children: Not on file  . Years of education: Not on file  . Highest education level: Not on file  Occupational History  . Not on file  Social Needs  . Financial resource strain: Not on file  .  Food insecurity    Worry: Not on file    Inability: Not on file  . Transportation needs    Medical: Not on file    Non-medical: Not on file  Tobacco Use  . Smoking status: Never Smoker  . Smokeless tobacco: Never Used  Substance and Sexual Activity  . Alcohol use: Yes    Alcohol/week: 0.0 standard drinks    Comment: occasionally  . Drug use: No  . Sexual activity: Not on file  Lifestyle  . Physical activity    Days per week: Not on file    Minutes per session: Not on file  . Stress: Not on file  Relationships  . Social Herbalist on phone: Not on file    Gets together: Not on file    Attends religious service: Not on file    Active member of club or organization: Not on file    Attends meetings of clubs or organizations: Not  on file    Relationship status: Not on file  Other Topics Concern  . Not on file  Social History Narrative   Hairdresser     Outpatient Encounter Medications as of 04/25/2019  Medication Sig  . predniSONE (DELTASONE) 5 MG tablet Take 5 mg by mouth daily.  Marland Kitchen albuterol (PROVENTIL HFA;VENTOLIN HFA) 108 (90 BASE) MCG/ACT inhaler Inhale 2 puffs into the lungs every 6 (six) hours as needed for wheezing or shortness of breath.  . benzonatate (TESSALON) 100 MG capsule Take 1 capsule (100 mg total) by mouth 3 (three) times daily as needed for cough.  . citalopram (CELEXA) 20 MG tablet Take 1 tablet (20 mg total) by mouth daily.  . cyclobenzaprine (FLEXERIL) 10 MG tablet Take 1 tablet by mouth at bedtime as needed.  Marland Kitchen Dexlansoprazole (DEXILANT) 30 MG capsule Take 30 mg by mouth daily.  . folic acid (FOLVITE) 1 MG tablet   . hydrochlorothiazide (HYDRODIURIL) 12.5 MG tablet Take 1 tablet (12.5 mg total) by mouth daily.  Marland Kitchen levocetirizine (XYZAL) 5 MG tablet Take 5 mg by mouth at bedtime.  Marland Kitchen losartan (COZAAR) 50 MG tablet Take 1 tablet (50 mg total) by mouth daily.  . medroxyPROGESTERone (DEPO-PROVERA) 150 MG/ML injection Inject 150 mg into the muscle every 3 (three) months.   . methotrexate (RHEUMATREX) 2.5 MG tablet Take 22.5 mg by mouth every Monday. Caution:Chemotherapy. Protect from light. Take 9 tabs po weekly with meals  . montelukast (SINGULAIR) 10 MG tablet take 1 tablet by mouth at bedtime  . [DISCONTINUED] citalopram (CELEXA) 10 MG tablet Take one tablet per day for one week and then increase to 2 tablets per day  . [DISCONTINUED] OLUMIANT 2 MG TABS   . [DISCONTINUED] valACYclovir (VALTREX) 1000 MG tablet Take 1 tablet (1,000 mg total) by mouth 2 (two) times daily.   Facility-Administered Encounter Medications as of 04/25/2019  Medication  . betamethasone acetate-betamethasone sodium phosphate (CELESTONE) injection 12 mg   Review of Systems  Constitutional: Negative for appetite change  and unexpected weight change.  HENT: Positive for trouble swallowing.        Allergies and congestion - controlled.    Respiratory: Negative for cough, chest tightness and shortness of breath.   Cardiovascular: Negative for chest pain, palpitations and leg swelling.  Gastrointestinal: Negative for abdominal pain, diarrhea, nausea and vomiting.  Genitourinary: Negative for difficulty urinating and dysuria.  Musculoskeletal: Negative for myalgias.       Joint aches and swelling as outlined.    Skin: Negative for  color change and rash.  Neurological: Negative for dizziness, light-headedness and headaches.  Psychiatric/Behavioral: Negative for agitation and dysphoric mood.       Doing better on citalopram.        Objective:    Physical Exam Constitutional:      General: She is not in acute distress.    Appearance: Normal appearance.  HENT:     Right Ear: External ear normal.     Left Ear: External ear normal.  Eyes:     General: No scleral icterus.       Right eye: No discharge.        Left eye: No discharge.     Conjunctiva/sclera: Conjunctivae normal.  Neck:     Musculoskeletal: Neck supple. No muscular tenderness.     Thyroid: No thyromegaly.  Cardiovascular:     Rate and Rhythm: Normal rate and regular rhythm.  Pulmonary:     Effort: No respiratory distress.     Breath sounds: Normal breath sounds. No wheezing.  Abdominal:     General: Bowel sounds are normal.     Palpations: Abdomen is soft.     Tenderness: There is no abdominal tenderness.  Musculoskeletal:     Comments: No lower extremity swelling or tenderness.    Lymphadenopathy:     Cervical: No cervical adenopathy.  Skin:    Findings: No erythema or rash.  Neurological:     Mental Status: She is alert.  Psychiatric:        Mood and Affect: Mood normal.        Behavior: Behavior normal.     BP 134/72   Pulse 83   Temp (!) 96.9 F (36.1 C)   Resp 16   Wt 177 lb 12.8 oz (80.6 kg)   SpO2 98%   BMI  33.60 kg/m  Wt Readings from Last 3 Encounters:  04/25/19 177 lb 12.8 oz (80.6 kg)  10/26/18 174 lb (78.9 kg)  08/24/18 172 lb 9.6 oz (78.3 kg)     Lab Results  Component Value Date   WBC 9.4 04/25/2019   HGB 11.4 (L) 04/25/2019   HCT 34.2 (L) 04/25/2019   PLT 402.0 (H) 04/25/2019   GLUCOSE 107 (H) 04/25/2019   CHOL 210 (H) 04/25/2019   TRIG 52.0 04/25/2019   HDL 62.90 04/25/2019   LDLCALC 137 (H) 04/25/2019   ALT 14 04/25/2019   AST 19 04/25/2019   NA 139 04/25/2019   K 4.1 04/25/2019   CL 104 04/25/2019   CREATININE 0.89 04/25/2019   BUN 19 04/25/2019   CO2 27 04/25/2019   TSH 1.97 04/25/2019    US Breast Ltd Uni Left Inc Axilla  Result Date: 09/06/2018 CLINICAL DATA:  48 year old presenting with thickening and fullness in the LEFT axilla. Strong family history of breast cancer in her mother who was diagnosed at age 10. EXAM: DIGITAL DIAGNOSTIC LEFT MAMMOGRAM WITH CAD AND TOMO ULTRASOUND LEFT BREAST/AXILLA COMPARISON:  Previous exam(s). ACR Breast Density Category d: The breast tissue is extremely dense, which lowers the sensitivity of mammography. FINDINGS: Tomosynthesis and synthesized full field CC and MLO views of the LEFT breast were obtained. Tomosynthesis and synthesized spot compression tangential view of the palpable concern in the axillary tail LEFT breast/low LEFT axilla was also obtained. No mammographic abnormality in the axillary tail of the LEFT breast/low LEFT axilla in the area of palpable concern. Normal-appearing low LEFT axillary lymph nodes are present. No findings suspicious for malignancy in the LEFT breast. Mammographic images  were processed with CAD. On physical exam, there is no palpable abnormality in the axillary tail of the LEFT breast or the low LEFT axilla. Targeted LEFT breast/axilla ultrasound is performed, showing normal fibrofatty tissue in the axillary tail of the breast and in the lower axilla. No mass or pathologic adenopathy is identified.  IMPRESSION: 1. No mammographic or sonographic evidence of malignancy involving the LEFT breast. 2. Normal fibrofatty tissue is present in the axillary tail of the LEFT breast/low LEFT axilla in the area of palpable concern. RECOMMENDATION: Annual BILATERAL screening mammography which is due in August, 2020. I have discussed the findings and recommendations with the patient. Results were also provided in writing at the conclusion of the visit. If applicable, a reminder letter will be sent to the patient regarding the next appointment. BI-RADS CATEGORY  1: Negative. Electronically Signed   By: Evangeline Dakin M.D.   On: 09/06/2018 09:29   Mm Diag Breast Tomo Uni Left  Result Date: 09/06/2018 CLINICAL DATA:  48 year old presenting with thickening and fullness in the LEFT axilla. Strong family history of breast cancer in her mother who was diagnosed at age 68. EXAM: DIGITAL DIAGNOSTIC LEFT MAMMOGRAM WITH CAD AND TOMO ULTRASOUND LEFT BREAST/AXILLA COMPARISON:  Previous exam(s). ACR Breast Density Category d: The breast tissue is extremely dense, which lowers the sensitivity of mammography. FINDINGS: Tomosynthesis and synthesized full field CC and MLO views of the LEFT breast were obtained. Tomosynthesis and synthesized spot compression tangential view of the palpable concern in the axillary tail LEFT breast/low LEFT axilla was also obtained. No mammographic abnormality in the axillary tail of the LEFT breast/low LEFT axilla in the area of palpable concern. Normal-appearing low LEFT axillary lymph nodes are present. No findings suspicious for malignancy in the LEFT breast. Mammographic images were processed with CAD. On physical exam, there is no palpable abnormality in the axillary tail of the LEFT breast or the low LEFT axilla. Targeted LEFT breast/axilla ultrasound is performed, showing normal fibrofatty tissue in the axillary tail of the breast and in the lower axilla. No mass or pathologic adenopathy is  identified. IMPRESSION: 1. No mammographic or sonographic evidence of malignancy involving the LEFT breast. 2. Normal fibrofatty tissue is present in the axillary tail of the LEFT breast/low LEFT axilla in the area of palpable concern. RECOMMENDATION: Annual BILATERAL screening mammography which is due in August, 2020. I have discussed the findings and recommendations with the patient. Results were also provided in writing at the conclusion of the visit. If applicable, a reminder letter will be sent to the patient regarding the next appointment. BI-RADS CATEGORY  1: Negative. Electronically Signed   By: Evangeline Dakin M.D.   On: 09/06/2018 09:29       Assessment & Plan:   Problem List Items Addressed This Visit    Anemia    Follow cbc.       Relevant Medications   folic acid (FOLVITE) 1 MG tablet   Anxiety    Doing better on citalopram.        Relevant Medications   citalopram (CELEXA) 20 MG tablet   Dysphagia    On dexilant.  Has noticed some increased difficulty swallowing - feeling fullness, etc.  Discussed f/u with GI.  She is in agreement for referral.        Relevant Orders   Ambulatory referral to Gastroenterology   Environmental allergies    Appears to be under reasonable control currently.       Essential hypertension, benign  A little elevated on my check today.  With increased joint pain, etc - may be contributing to some elevation.  Have her spot check her pressures and send in readings.  Get her back in soon to reassess.        Relevant Orders   Comprehensive metabolic panel (Completed)   CBC with Differential/Platelet (Completed)   Family history of breast cancer    Scheduled for f/u mammogram 07/11/19.  Message sent to confirm if earlier f/u warranted.        Family history of colon cancer    Had colonoscopy 02/2013 - rectal polyp.  Refer to GI as outlined.        Relevant Orders   Ambulatory referral to Gastroenterology   GERD (gastroesophageal reflux  disease)    Acid reflux appears to be controlled on dexilant as outlined.  Had documented EGD 05/2018.  Reports increased swallowing problems recently.  Request referral to see GI.        Hypercholesterolemia    Low cholesterol diet and exericse.  Follow lipid panel.       Relevant Orders   Lipid panel (Completed)   Hypothyroidism    On thyroid replacement.  Follow tsh.       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  Had recommended f/u in one year.  Appears to be overdue.  Will f/u with her regarding neurosurgery follow up.       Rheumatoid arthritis ([place])    Followed by Dr Jefm Bryant.  Had to be off olumiant for three weeks secondary to dental abscess.  Joint flares - persistent now.  On prednisone taper.  In the process of getting actemra pre cert.  Continue f/u with Dr Jefm Bryant.        Relevant Medications   predniSONE (DELTASONE) 5 MG tablet   cyclobenzaprine (FLEXERIL) 10 MG tablet   Snoring    With snoring and increased daytime somnolence.  Concern over possible sleep apnea.  Have placed order for pulmonary referral.  F/u on getting this scheduled.        Thyroid fullness    Schedule thyroid ultrasound.        Relevant Orders   US THYROID   Vitamin D deficiency    Follow vitamin D level.           Einar Pheasant, MD

## 2019-04-26 ENCOUNTER — Other Ambulatory Visit: Payer: Self-pay | Admitting: Internal Medicine

## 2019-04-26 ENCOUNTER — Other Ambulatory Visit (INDEPENDENT_AMBULATORY_CARE_PROVIDER_SITE_OTHER): Payer: 59

## 2019-04-26 DIAGNOSIS — D649 Anemia, unspecified: Secondary | ICD-10-CM

## 2019-04-26 NOTE — Progress Notes (Signed)
Order placed for f/u lab.   

## 2019-04-26 NOTE — Progress Notes (Signed)
Order placed for f/u labs.  

## 2019-04-28 LAB — VITAMIN B12: Vitamin B-12: 264 pg/mL (ref 211–911)

## 2019-04-28 LAB — IBC + FERRITIN
Ferritin: 89.9 ng/mL (ref 10.0–291.0)
Iron: 94 ug/dL (ref 42–145)
Saturation Ratios: 27 % (ref 20.0–50.0)
Transferrin: 249 mg/dL (ref 212.0–360.0)

## 2019-04-30 ENCOUNTER — Other Ambulatory Visit: Payer: Self-pay | Admitting: Internal Medicine

## 2019-04-30 DIAGNOSIS — R748 Abnormal levels of other serum enzymes: Secondary | ICD-10-CM

## 2019-04-30 DIAGNOSIS — D473 Essential (hemorrhagic) thrombocythemia: Secondary | ICD-10-CM

## 2019-04-30 DIAGNOSIS — D75839 Thrombocytosis, unspecified: Secondary | ICD-10-CM

## 2019-04-30 NOTE — Progress Notes (Signed)
Order placed for f/u labs.  

## 2019-05-01 ENCOUNTER — Telehealth: Payer: Self-pay | Admitting: Internal Medicine

## 2019-05-01 ENCOUNTER — Encounter: Payer: Self-pay | Admitting: Internal Medicine

## 2019-05-01 DIAGNOSIS — E0789 Other specified disorders of thyroid: Secondary | ICD-10-CM | POA: Insufficient documentation

## 2019-05-01 NOTE — Assessment & Plan Note (Signed)
Acid reflux appears to be controlled on dexilant as outlined.  Had documented EGD 05/2018.  Reports increased swallowing problems recently.  Request referral to see GI.

## 2019-05-01 NOTE — Assessment & Plan Note (Signed)
Low cholesterol diet and exericse.  Follow lipid panel.

## 2019-05-01 NOTE — Assessment & Plan Note (Addendum)
Scheduled for f/u mammogram 07/11/19.  Message sent to confirm if earlier f/u warranted.

## 2019-05-01 NOTE — Assessment & Plan Note (Signed)
Appears to be under reasonable control currently.

## 2019-05-01 NOTE — Assessment & Plan Note (Signed)
On dexilant.  Has noticed some increased difficulty swallowing - feeling fullness, etc.  Discussed f/u with GI.  She is in agreement for referral.

## 2019-05-01 NOTE — Assessment & Plan Note (Signed)
A little elevated on my check today.  With increased joint pain, etc - may be contributing to some elevation.  Have her spot check her pressures and send in readings.  Get her back in soon to reassess.

## 2019-05-01 NOTE — Assessment & Plan Note (Signed)
Had colonoscopy 02/2013 - rectal polyp.  Refer to GI as outlined.

## 2019-05-01 NOTE — Telephone Encounter (Signed)
My chart message sent to pt regarding f/u with neurosurgery and earlier f/u mammogram.

## 2019-05-01 NOTE — Assessment & Plan Note (Signed)
Schedule thyroid ultrasound

## 2019-05-01 NOTE — Assessment & Plan Note (Signed)
On thyroid replacement.  Follow tsh.  

## 2019-05-01 NOTE — Assessment & Plan Note (Signed)
Follow cbc.  

## 2019-05-01 NOTE — Assessment & Plan Note (Signed)
Followed by Dr Jefm Bryant.  Had to be off olumiant for three weeks secondary to dental abscess.  Joint flares - persistent now.  On prednisone taper.  In the process of getting actemra pre cert.  Continue f/u with Dr Jefm Bryant.

## 2019-05-01 NOTE — Assessment & Plan Note (Signed)
With snoring and increased daytime somnolence.  Concern over possible sleep apnea.  Have placed order for pulmonary referral.  F/u on getting this scheduled.

## 2019-05-01 NOTE — Assessment & Plan Note (Signed)
Followed by neurosurgery.  Had recommended f/u in one year.  Appears to be overdue.  Will f/u with her regarding neurosurgery follow up.

## 2019-05-01 NOTE — Assessment & Plan Note (Signed)
Doing better on citalopram.

## 2019-05-01 NOTE — Assessment & Plan Note (Signed)
Follow vitamin D level.  

## 2019-05-10 ENCOUNTER — Ambulatory Visit: Payer: 59

## 2019-05-10 ENCOUNTER — Encounter: Payer: Self-pay | Admitting: Internal Medicine

## 2019-05-10 MED ORDER — VALACYCLOVIR HCL 500 MG PO TABS: ORAL_TABLET | ORAL | 0 refills | Status: DC

## 2019-05-10 NOTE — Telephone Encounter (Signed)
Ok to refill her valacyclovir for her?

## 2019-05-10 NOTE — Telephone Encounter (Signed)
rx sent in for valtrex.

## 2019-05-16 ENCOUNTER — Ambulatory Visit
Admission: RE | Admit: 2019-05-16 | Discharge: 2019-05-16 | Disposition: A | Discharge status: Home / Self Care | Payer: 59 | Source: Ambulatory Visit | Attending: Internal Medicine | Admitting: Internal Medicine

## 2019-05-16 ENCOUNTER — Other Ambulatory Visit: Payer: Self-pay

## 2019-05-16 DIAGNOSIS — E0789 Other specified disorders of thyroid: Secondary | ICD-10-CM | POA: Insufficient documentation

## 2019-05-17 ENCOUNTER — Ambulatory Visit (INDEPENDENT_AMBULATORY_CARE_PROVIDER_SITE_OTHER): Payer: 59

## 2019-05-17 DIAGNOSIS — Z3042 Encounter for surveillance of injectable contraceptive: Secondary | ICD-10-CM | POA: Diagnosis not present

## 2019-05-17 MED ORDER — MEDROXYPROGESTERONE ACETATE 150 MG/ML IM SUSP
150.0000 mg | Freq: Once | INTRAMUSCULAR | Status: AC
  Administered 2019-05-17: 150 mg via INTRAMUSCULAR

## 2019-05-17 NOTE — Progress Notes (Addendum)
[name] presents today for injection per MD orders.depoprogesterone injection  administered IM  in right Gluteal. Administration without incident. Patient tolerated well. Rheba Diamond,cma   Reviewed.  Dr Nicki Reaper

## 2019-05-18 ENCOUNTER — Telehealth: Payer: Self-pay | Admitting: Internal Medicine

## 2019-05-18 DIAGNOSIS — E041 Nontoxic single thyroid nodule: Secondary | ICD-10-CM | POA: Insufficient documentation

## 2019-05-18 NOTE — Telephone Encounter (Signed)
Order placed for endocrinology referral.  

## 2019-05-18 NOTE — Telephone Encounter (Signed)
-----   Message from Lars Masson, LPN sent at [id number] 12:57 PM EST ----- Patient is aware of results and is okay with being referred to endocrinology.

## 2019-05-18 NOTE — Telephone Encounter (Signed)
My chart message sent to pt to clarify which endocrinologist she wants to see.

## 2019-05-18 NOTE — Addendum Note (Signed)
Addended by: Alisa Graff on: 05/18/2019 01:44 PM   Modules accepted: Orders

## 2019-05-19 ENCOUNTER — Other Ambulatory Visit: Payer: Self-pay

## 2019-05-19 ENCOUNTER — Ambulatory Visit (INDEPENDENT_AMBULATORY_CARE_PROVIDER_SITE_OTHER): Payer: 59 | Admitting: Pulmonary Disease

## 2019-05-19 ENCOUNTER — Encounter: Payer: Self-pay | Admitting: Pulmonary Disease

## 2019-05-19 VITALS — BP 134/88 | HR 96 | Temp 98.1°F | Ht 61.81 in | Wt 177.6 lb

## 2019-05-19 DIAGNOSIS — R0683 Snoring: Secondary | ICD-10-CM

## 2019-05-19 NOTE — Patient Instructions (Signed)
Will arrange for home sleep study Will call to arrange for follow up after sleep study reviewed  

## 2019-05-19 NOTE — Progress Notes (Signed)
[facility]  Chief Complaint  Patient presents with  . 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 female   She had sleep study in 2018.  AHI 3.5.  Weight was 160 lbs then.  Since then her sleep has gotten worse.  She is snoring more and wakes up feeling choked.  She has an adjustable bed, but this hasn't helped.  She has trouble sleeping on her back and wakes during dreams.  She has trouble staying awake when she is sitting quiet.  She goes to sleep at 10 pm.  She falls asleep in 20 minutes.  She wakes up in 1 or 2 times to use the bathroom.  She gets out of bed at 6 am .  She feels tired in the morning.  She denies morning headache.  She does not use anything to help her fall sleep or stay awake.  She denies sleep walking, sleep talking, bruxism, or nightmares.  There is no history of restless legs.  She denies sleep hallucinations, sleep paralysis, or cataplexy.  The Epworth score is 10 out of 24.    Physical Exam:   Appearance - well kempt   ENMT - clear nasal mucosa, midline nasal  septum, no oral exudates, no LAN, trachea midline, MP 3, click with jaw opening  Respiratory - normal chest wall, normal respiratory effort, no accessory muscle use, no wheeze/rales  CV - s1s2 regular rate and rhythm, no murmurs, no peripheral edema, radial pulses symmetric  GI - soft, non tender, no masses  Lymph - no adenopathy noted in neck and axillary areas  MSK -  normal gait  Ext - no cyanosis, clubbing, or joint inflammation noted  Skin - no rashes, lesions, or ulcers  Neuro - normal strength, oriented x 3  Psych - normal mood and affect  Discussion:  She has snoring, sleep disruption, witnessed apnea, and daytime sleepiness.  Her symptoms have progressed since prior sleep study in 2018 and she has gained weight since then also.  She has history of hypertension and anxiety.  I am concerned that she has progressed to develop obstructive sleep apnea.  Assessment/Plan:   Snoring with excessive daytime sleepiness. - will need to arrange for a home sleep study  Obesity. - discussed how weight can impact sleep and risk for sleep disordered breathing - discussed options to assist with weight loss: combination of diet modification, cardiovascular and strength training exercises  Cardiovascular risk. - had an extensive discussion regarding the adverse health consequences related to untreated sleep disordered breathing - specifically discussed the risks for hypertension, coronary artery disease, cardiac dysrhythmias, cerebrovascular disease, and diabetes - lifestyle modification discussed  Safe driving practices. - discussed how sleep disruption can increase risk of accidents, particularly when driving - safe driving practices were discussed  Therapies for obstructive sleep apnea. - if the sleep study shows significant sleep apnea, then various therapies for treatment were reviewed:  CPAP, oral appliance, and surgical interventions  History of rheumatoid arthritis. - followed by Dr. Percell Boston - has recently been on prednisone, MTX, actemra   Patient Instructions  Will arrange for home sleep study Will call to arrange for follow up after sleep study reviewed     Chesley Mires, MD [place] Pager: [phone] 05/19/2019, 11:28 AM  Flow Sheet    Sleep tests:  PSG 04/17/17 >> AHI 3.5, SpO2 low 85.6%   Review of Systems:  Acid reflux, Indigestion, Nasal congestion, Joint stiffness.  Remainder reviewed and negative.  Medications:   Allergies as of 05/19/2019      Reactions   Inh [isoniazid] Other (See Comments)   Causes hepatitis      Medication List       Accurate as of May 19, 2019 11:28 AM. If you have any questions, ask your nurse or doctor.        Actemra 162 MG/0.9ML Sosy Generic drug: Tocilizumab Inject into the skin.   albuterol 108 (90 Base) MCG/ACT inhaler Commonly known as: VENTOLIN HFA Inhale 2 puffs into the lungs every 6 (six) hours as needed for wheezing or shortness of breath.   benzonatate 100 MG capsule Commonly known as: TESSALON Take 1 capsule (100 mg total) by mouth 3 (three) times daily as needed for cough.   citalopram 20 MG tablet Commonly known as: CELEXA Take 1 tablet (20 mg total) by mouth daily.   cyclobenzaprine 10 MG tablet Commonly known as: FLEXERIL Take 1 tablet by mouth at bedtime as needed.   Dexilant 30 MG capsule Generic drug: Dexlansoprazole Take 30 mg by mouth daily.   folic acid 1 MG tablet Commonly known as: FOLVITE   hydrochlorothiazide 12.5 MG tablet Commonly known as: HYDRODIURIL Take 1 tablet (12.5 mg total) by mouth daily.   levocetirizine 5 MG tablet Commonly known as: XYZAL Take 5 mg by mouth at bedtime.   losartan 50 MG tablet Commonly known as: COZAAR Take 1 tablet (50 mg total) by mouth daily.   medroxyPROGESTERone 150 MG/ML injection Commonly known as: DEPO-PROVERA Inject 150 mg into the muscle every 3 (three) months.   methotrexate 2.5 MG tablet Commonly known as: RHEUMATREX Take 22.5 mg by mouth every Monday. Caution:Chemotherapy. Protect from light. Take 9 tabs po weekly with meals   montelukast 10 MG tablet Commonly known as: SINGULAIR take 1 tablet by mouth at bedtime   predniSONE 5 MG tablet Commonly known as: DELTASONE Take 5 mg by mouth daily.   valACYclovir 500 MG tablet  Commonly known as: Valtrex Take one tablet bid x 3 days.  May repeat for flares as directed.       Past Surgical History:  She  has a past surgical history that includes Cesarean section (1996); Post Septoplasty and turbinate reduction (2007); Trigger finger release; Carpal tunnel release (Right); Craniotomy (Left, 06/25/2017); Colonoscopy (03/14/2002, 08/16/2007, 03/07/2013); Esophagogastroduodenoscopy (03/14/2002, 03/07/2013); Colonoscopy with propofol (N/A, 06/21/2018); and Esophagogastroduodenoscopy (egd) with propofol (N/A, 06/21/2018).  Family History:  Her family history includes Breast cancer (age of onset: 29) in her mother; Cancer in her father, maternal grandmother, maternal uncle, and mother; Diabetes in her father; Heart disease in her father; Hypertension in her father.  Social History:  She  reports that she has never smoked. She has never used smokeless tobacco. She reports current alcohol use. She reports that she does not use drugs.

## 2019-05-24 ENCOUNTER — Other Ambulatory Visit: Payer: Self-pay

## 2019-05-30 ENCOUNTER — Other Ambulatory Visit: Payer: 59

## 2019-05-30 ENCOUNTER — Other Ambulatory Visit: Payer: Self-pay

## 2019-05-30 ENCOUNTER — Other Ambulatory Visit (INDEPENDENT_AMBULATORY_CARE_PROVIDER_SITE_OTHER): Payer: 59

## 2019-05-30 DIAGNOSIS — D473 Essential (hemorrhagic) thrombocythemia: Secondary | ICD-10-CM | POA: Diagnosis not present

## 2019-05-30 DIAGNOSIS — D75839 Thrombocytosis, unspecified: Secondary | ICD-10-CM

## 2019-05-30 DIAGNOSIS — R748 Abnormal levels of other serum enzymes: Secondary | ICD-10-CM | POA: Diagnosis not present

## 2019-05-31 LAB — CBC WITH DIFFERENTIAL/PLATELET
Basophils Absolute: 0 10*3/uL (ref 0.0–0.1)
Basophils Relative: 0.4 % (ref 0.0–3.0)
Eosinophils Absolute: 0 10*3/uL (ref 0.0–0.7)
Eosinophils Relative: 0 % (ref 0.0–5.0)
HCT: 39.5 % (ref 36.0–46.0)
Hemoglobin: 12.9 g/dL (ref 12.0–15.0)
Lymphocytes Relative: 10.5 % — ABNORMAL LOW (ref 12.0–46.0)
Lymphs Abs: 0.8 10*3/uL (ref 0.7–4.0)
MCHC: 32.8 g/dL (ref 30.0–36.0)
MCV: 94.5 fl (ref 78.0–100.0)
Monocytes Absolute: 0.3 10*3/uL (ref 0.1–1.0)
Monocytes Relative: 3.2 % (ref 3.0–12.0)
Neutro Abs: 6.9 10*3/uL (ref 1.4–7.7)
Neutrophils Relative %: 85.9 % — ABNORMAL HIGH (ref 43.0–77.0)
Platelets: 334 10*3/uL (ref 150.0–400.0)
RBC: 4.18 Mil/uL (ref 3.87–5.11)
RDW: 15.6 % — ABNORMAL HIGH (ref 11.5–15.5)
WBC: 8 10*3/uL (ref 4.0–10.5)

## 2019-05-31 LAB — HEPATIC FUNCTION PANEL
ALT: 17 U/L (ref 0–35)
AST: 18 U/L (ref 0–37)
Albumin: 4.1 g/dL (ref 3.5–5.2)
Alkaline Phosphatase: 83 U/L (ref 39–117)
Bilirubin, Direct: 0.1 mg/dL (ref 0.0–0.3)
Total Bilirubin: 0.7 mg/dL (ref 0.2–1.2)
Total Protein: 7.1 g/dL (ref 6.0–8.3)

## 2019-06-01 ENCOUNTER — Encounter: Payer: Self-pay | Admitting: Internal Medicine

## 2019-06-08 ENCOUNTER — Other Ambulatory Visit: Payer: Self-pay | Admitting: Neurosurgery

## 2019-06-08 DIAGNOSIS — D329 Benign neoplasm of meninges, unspecified: Secondary | ICD-10-CM

## 2019-06-13 ENCOUNTER — Telehealth: Payer: Self-pay | Admitting: Internal Medicine

## 2019-06-13 ENCOUNTER — Ambulatory Visit (INDEPENDENT_AMBULATORY_CARE_PROVIDER_SITE_OTHER): Payer: 59 | Admitting: Internal Medicine

## 2019-06-13 ENCOUNTER — Other Ambulatory Visit: Payer: Self-pay

## 2019-06-13 DIAGNOSIS — Z9109 Other allergy status, other than to drugs and biological substances: Secondary | ICD-10-CM | POA: Diagnosis not present

## 2019-06-13 DIAGNOSIS — F419 Anxiety disorder, unspecified: Secondary | ICD-10-CM | POA: Diagnosis not present

## 2019-06-13 DIAGNOSIS — K219 Gastro-esophageal reflux disease without esophagitis: Secondary | ICD-10-CM

## 2019-06-13 DIAGNOSIS — D329 Benign neoplasm of meninges, unspecified: Secondary | ICD-10-CM

## 2019-06-13 DIAGNOSIS — R1310 Dysphagia, unspecified: Secondary | ICD-10-CM

## 2019-06-13 DIAGNOSIS — D649 Anemia, unspecified: Secondary | ICD-10-CM | POA: Diagnosis not present

## 2019-06-13 DIAGNOSIS — E559 Vitamin D deficiency, unspecified: Secondary | ICD-10-CM

## 2019-06-13 DIAGNOSIS — M059 Rheumatoid arthritis with rheumatoid factor, unspecified: Secondary | ICD-10-CM

## 2019-06-13 DIAGNOSIS — E7800 Pure hypercholesterolemia, unspecified: Secondary | ICD-10-CM

## 2019-06-13 DIAGNOSIS — E039 Hypothyroidism, unspecified: Secondary | ICD-10-CM

## 2019-06-13 DIAGNOSIS — I10 Essential (primary) hypertension: Secondary | ICD-10-CM

## 2019-06-13 DIAGNOSIS — Z803 Family history of malignant neoplasm of breast: Secondary | ICD-10-CM

## 2019-06-13 NOTE — Telephone Encounter (Signed)
Lm for pt to call back to schedule a cpe and fasting labs in 68m

## 2019-06-13 NOTE — Progress Notes (Signed)
Patient ID: Tina Parker, female   DOB: 1971-04-02, 48 y.o.   MRN: [id number]   Virtual Visit via video Note  This visit type was conducted due to national recommendations for restrictions regarding the COVID-19 pandemic (e.g. social distancing).  This format is felt to be most appropriate for this patient at this time.  All issues noted in this document were discussed and addressed.  No physical exam was performed (except for noted visual exam findings with Video Visits).   I connected with Tynlee Richmon by a video enabled telemedicine application and verified that I am speaking with the correct person using two identifiers. Location patient: home Location provider: work Persons participating in the virtual visit: patient, provider  I discussed the limitations, risks, security and privacy concerns of performing an evaluation and management service by video and the availability of in person appointments. The patient expressed understanding and agreed to proceed.   Reason for visit: scheduled follow up.   HPI: She reports she is doing relatively well.  Still with increased problems swallowing.  On dexilant.  No actual acid reflux.  States feels like food gets stuck.  Occurs every time she eats.  No regurgitation of food.  No nausea or vomiting.  May take 30 minutes for it to feel like food has gone down.  Occasional choking with liquids.  Has an appt with GI, but is a few weeks away.  Discussed seeing if we could get an earlier appt.  Allergies doing ok on current regimen.  Breathing overall stable.  Saw pulmonary.  Waiting for home sleep study.  Delayed due to covid.  No abdominal pain reported.  She is scheduled for f/u mammogram and f/u MRI and appt with Dr Christella Noa - to f/u regarding meningioma.  Seeing Dr Jefm Bryant.  On Actemra now.  Still on prednisone.  Has f/u with him today.  Joints are some better.  Still having issues with her shoulders.  Taking citalopram.  Feels she is managing things  relatively well.  Will follow.  Still with increased stress - working with covid numbers increasing, etc.     ROS: See pertinent positives and negatives per HPI.  Past Medical History:  Diagnosis Date  . Allergic rhinitis   . Anemia   . Arthritis    RA  . Carpal tunnel syndrome   . Dysphagia   . Esophageal spasm 01/16/2014  . GERD (gastroesophageal reflux disease)   . Graves disease    remission, no ablation, positive medical treatment  . History of hiatal hernia   . Hypertension   . Migraine headache    migraines  . PPD positive    hepatitis secondary to [place]  . Pure hypercholesterolemia   . Rheumatoid arthritis(714.0)    positive anti CCP antibodies, positive RF, oligo-articular, MTX    Past Surgical History:  Procedure Laterality Date  . CARPAL TUNNEL RELEASE Right   . CESAREAN SECTION  1996  . COLONOSCOPY  03/14/2002, 08/16/2007, 03/07/2013   [facility]-father  . COLONOSCOPY WITH PROPOFOL N/A 06/21/2018   Procedure: COLONOSCOPY WITH PROPOFOL;  Surgeon: Manya Silvas, MD;  Location: [facility] ENDOSCOPY;  Service: Endoscopy;  Laterality: N/A;  . CRANIOTOMY Left 06/25/2017   Procedure: CRANIOTOMY TEMPORAL LEFT FOR TUMOR RESECTION;  Surgeon: Ashok Pall, MD;  Location: [facility];  Service: Neurosurgery;  Laterality: Left;  . ESOPHAGOGASTRODUODENOSCOPY  03/14/2002, 03/07/2013  . ESOPHAGOGASTRODUODENOSCOPY (EGD) WITH PROPOFOL N/A 06/21/2018   Procedure: ESOPHAGOGASTRODUODENOSCOPY (EGD) WITH PROPOFOL;  Surgeon: Manya Silvas, MD;  Location:  [facility] ENDOSCOPY;  Service: Endoscopy;  Laterality: N/A;  . Post Septoplasty and turbinate reduction  2007  . TRIGGER FINGER RELEASE      Family History  Problem Relation Age of Onset  . Cancer Mother        Breast Cancer  . Breast cancer Mother 18  . Cancer Father        Colon Cancer  . Heart disease Father        Hx of MI  . Hypertension Father   . Diabetes Father   . Cancer Maternal Grandmother        lung cancer  . Cancer Maternal Uncle         esophageal    SOCIAL HX: reviewed.    Current Outpatient Medications:  .  albuterol (PROVENTIL HFA;VENTOLIN HFA) 108 (90 BASE) MCG/ACT inhaler, Inhale 2 puffs into the lungs every 6 (six) hours as needed for wheezing or shortness of breath., Disp: 1 Inhaler, Rfl: 2 .  benzonatate (TESSALON) 100 MG capsule, Take 1 capsule (100 mg total) by mouth 3 (three) times daily as needed for cough., Disp: 21 capsule, Rfl: 0 .  citalopram (CELEXA) 20 MG tablet, Take 1 tablet (20 mg total) by mouth daily., Disp: 30 tablet, Rfl: 5 .  cyclobenzaprine (FLEXERIL) 10 MG tablet, Take 1 tablet by mouth at bedtime as needed., Disp: , Rfl:  .  Dexlansoprazole (DEXILANT) 30 MG capsule, Take 30 mg by mouth daily., Disp: , Rfl:  .  folic acid (FOLVITE) 1 MG tablet, , Disp: , Rfl:  .  hydrochlorothiazide (HYDRODIURIL) 12.5 MG tablet, Take 1 tablet (12.5 mg total) by mouth daily., Disp: 90 tablet, Rfl: 1 .  levocetirizine (XYZAL) 5 MG tablet, Take 5 mg by mouth at bedtime., Disp: , Rfl:  .  losartan (COZAAR) 50 MG tablet, Take 1 tablet (50 mg total) by mouth daily., Disp: 90 tablet, Rfl: 1 .  medroxyPROGESTERone (DEPO-PROVERA) 150 MG/ML injection, Inject 150 mg into the muscle every 3 (three) months. , Disp: , Rfl:  .  methotrexate (RHEUMATREX) 2.5 MG tablet, Take 22.5 mg by mouth every Monday. Caution:Chemotherapy. Protect from light. Take 9 tabs po weekly with meals, Disp: , Rfl:  .  montelukast (SINGULAIR) 10 MG tablet, take 1 tablet by mouth at bedtime, Disp: 30 tablet, Rfl: 3 .  predniSONE (DELTASONE) 5 MG tablet, Take 5 mg by mouth daily., Disp: , Rfl:  .  Tocilizumab (ACTEMRA) 162 MG/0.9ML SOSY, Inject into the skin., Disp: , Rfl:  .  valACYclovir (VALTREX) 500 MG tablet, Take one tablet bid x 3 days.  May repeat for flares as directed., Disp: 30 tablet, Rfl: 0  Current Facility-Administered Medications:  .  betamethasone acetate-betamethasone sodium phosphate (CELESTONE) injection 12 mg, 12 mg, Other,  Once, Magnus Sinning, MD  Jasmine DecemberTonette Bihari per patient if applicable: blood pressures averaging upper 120 to low 130s/78-31.    GENERAL: alert, oriented, appears well and in no acute distress  HEENT: atraumatic, conjunttiva clear, no obvious abnormalities on inspection of external nose and ears  NECK: normal movements of the head and neck  LUNGS: on inspection no signs of respiratory distress, breathing rate appears normal, no obvious gross SOB, gasping or wheezing  CV: no obvious cyanosis  PSYCH/NEURO: pleasant and cooperative, no obvious depression or anxiety, speech and thought processing grossly intact  ASSESSMENT AND PLAN:  Discussed the following assessment and plan:  Anemia Follow cbc.   Anxiety Overall she feels she is handling things relatively well.  On  citalopram.    Dysphagia On dexilant.  No actual acid reflux.  Difficulty swallowing and had the feeling of food not going down as outlined.  Has appt with GI.  Can see if can get an earlier appt.    Environmental allergies Appears to be under reasonable control.    Essential hypertension, benign Blood pressure has been under reasonable control.  Follow.  Continue current medication regimen.    GERD (gastroesophageal reflux disease) Controlled on dexilant.  Dysphagia as outlined.  F/u with GI.   Hypercholesterolemia Low cholesterol diet and exercise.  Follow lipid panel.   Hypothyroidism On thyroid replacement.  Follow tsh.   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  Has f/u MRI ([facility]) 07/04/19.    Rheumatoid arthritis On actemra now.  Joints doing some better.  Follow.    Vitamin D deficiency Follow vitamin D level.    Family history of breast cancer Scheduled for mammogram 07/11/19.      I discussed the assessment and treatment plan with the patient. The patient was provided an opportunity to ask questions and all were answered. The patient agreed  with the plan and demonstrated an understanding of the instructions.   The patient was advised to call back or seek an in-person evaluation if the symptoms worsen or if the condition fails to improve as anticipated.   Einar Pheasant, MD

## 2019-06-18 ENCOUNTER — Encounter: Payer: Self-pay | Admitting: Internal Medicine

## 2019-06-18 NOTE — Assessment & Plan Note (Signed)
Overall she feels she is handling things relatively well.  On citalopram.

## 2019-06-18 NOTE — Assessment & Plan Note (Signed)
Controlled on dexilant.  Dysphagia as outlined.  F/u with GI.

## 2019-06-18 NOTE — Assessment & Plan Note (Signed)
On dexilant.  No actual acid reflux.  Difficulty swallowing and had the feeling of food not going down as outlined.  Has appt with GI.  Can see if can get an earlier appt.

## 2019-06-18 NOTE — Assessment & Plan Note (Signed)
Scheduled for mammogram 07/11/19.

## 2019-06-18 NOTE — Assessment & Plan Note (Signed)
Followed by neurosurgery.  Has f/u MRI ([facility]) 07/04/19.

## 2019-06-18 NOTE — Assessment & Plan Note (Signed)
On actemra now.  Joints doing some better.  Follow.

## 2019-06-18 NOTE — Assessment & Plan Note (Signed)
Appears to be under reasonable control.

## 2019-06-18 NOTE — Assessment & Plan Note (Signed)
Follow cbc.  

## 2019-06-18 NOTE — Assessment & Plan Note (Signed)
Blood pressure has been under reasonable control.  Follow.  Continue current medication regimen.

## 2019-06-18 NOTE — Assessment & Plan Note (Signed)
Low cholesterol diet and exercise.  Follow lipid panel.   

## 2019-06-18 NOTE — Assessment & Plan Note (Signed)
Follow vitamin D level.  

## 2019-06-18 NOTE — Assessment & Plan Note (Signed)
On thyroid replacement.  Follow tsh.  

## 2019-07-04 ENCOUNTER — Ambulatory Visit
Admission: RE | Admit: 2019-07-04 | Discharge: 2019-07-04 | Disposition: A | Discharge status: Home / Self Care | Payer: 59 | Source: Ambulatory Visit | Attending: Neurosurgery | Admitting: Neurosurgery

## 2019-07-04 DIAGNOSIS — D329 Benign neoplasm of meninges, unspecified: Secondary | ICD-10-CM

## 2019-07-04 MED ORDER — GADOBENATE DIMEGLUMINE 529 MG/ML IV SOLN
15.0000 mL | Freq: Once | INTRAVENOUS | Status: AC | PRN
  Administered 2019-07-04: 15 mL via INTRAVENOUS

## 2019-07-06 ENCOUNTER — Ambulatory Visit: Payer: 59 | Admitting: Gastroenterology

## 2019-07-06 ENCOUNTER — Other Ambulatory Visit: Payer: Self-pay

## 2019-07-06 VITALS — BP 129/88 | HR 109 | Temp 98.1°F | Ht 61.0 in | Wt 182.4 lb

## 2019-07-06 DIAGNOSIS — K219 Gastro-esophageal reflux disease without esophagitis: Secondary | ICD-10-CM | POA: Diagnosis not present

## 2019-07-06 DIAGNOSIS — R1310 Dysphagia, unspecified: Secondary | ICD-10-CM | POA: Diagnosis not present

## 2019-07-06 DIAGNOSIS — K5900 Constipation, unspecified: Secondary | ICD-10-CM | POA: Diagnosis not present

## 2019-07-06 NOTE — Progress Notes (Signed)
Jonathon Bellows MD, MRCP(U.K) [address]  [place]  [place], [place] [address]  Main: [phone]  Fax: [phone]   Gastroenterology Consultation  Referring Provider:     Einar Pheasant, MD Primary Care Physician:  Einar Pheasant, MD Primary Gastroenterologist:  Dr. Jonathon Bellows  Reason for Consultation: Dysphagia        HPI:   Tina Parker is a 49 y.o. y/o female referred for consultation & management  by Dr. Einar Pheasant, MD.     She has been referred for dysphagia previously a patient of Dr. Vira Agar at [facility] last seen at their office in 03/19/2018 for acid reflux. At that time plan was also for a colonoscopy due to family history of colon cancer.  06/18/2018 colonoscopy: Normal except for internal hemorrhoids 06/18/2018 EGD: Schatzki's ring dilated to 16 mm no biopsies of the esophagus taken biopsies of the stomach taken due to suspicion for gastritis. Which were negative for pathology.  She did feel better after her last dilation but gradually the dysphagia returned.  More for solids and liquids.  Has gained weight.  On steroids for her rheumatoid arthritis.  Occasional use of NSAIDs.  Her other main complaint is bloating.  Significant abdominal distention.  Gas is foul-smelling.  Does not have a bowel movement every day.  Probably every other day.  Takes Dulcolax as needed.  Does not feel satisfied after a bowel movement.  Past Medical History:  Diagnosis Date  . Allergic rhinitis   . Anemia   . Arthritis    RA  . Carpal tunnel syndrome   . Dysphagia   . Esophageal spasm 01/16/2014  . GERD (gastroesophageal reflux disease)   . Graves disease    remission, no ablation, positive medical treatment  . History of hiatal hernia   . Hypertension   . Migraine headache    migraines  . PPD positive    hepatitis secondary to [place]  . Pure hypercholesterolemia   . Rheumatoid arthritis(714.0)    positive anti CCP antibodies, positive RF, oligo-articular,  MTX    Past Surgical History:  Procedure Laterality Date  . CARPAL TUNNEL RELEASE Right   . CESAREAN SECTION  1996  . COLONOSCOPY  03/14/2002, 08/16/2007, 03/07/2013   [facility]-father  . COLONOSCOPY WITH PROPOFOL N/A 06/21/2018   Procedure: COLONOSCOPY WITH PROPOFOL;  Surgeon: Manya Silvas, MD;  Location: [facility] ENDOSCOPY;  Service: Endoscopy;  Laterality: N/A;  . CRANIOTOMY Left 06/25/2017   Procedure: CRANIOTOMY TEMPORAL LEFT FOR TUMOR RESECTION;  Surgeon: Ashok Pall, MD;  Location: [facility];  Service: Neurosurgery;  Laterality: Left;  . ESOPHAGOGASTRODUODENOSCOPY  03/14/2002, 03/07/2013  . ESOPHAGOGASTRODUODENOSCOPY (EGD) WITH PROPOFOL N/A 06/21/2018   Procedure: ESOPHAGOGASTRODUODENOSCOPY (EGD) WITH PROPOFOL;  Surgeon: Manya Silvas, MD;  Location: [facility] ENDOSCOPY;  Service: Endoscopy;  Laterality: N/A;  . Post Septoplasty and turbinate reduction  2007  . TRIGGER FINGER RELEASE      Prior to Admission medications   Medication Sig Start Date End Date Taking? Authorizing Provider  albuterol (PROVENTIL HFA;VENTOLIN HFA) 108 (90 BASE) MCG/ACT inhaler Inhale 2 puffs into the lungs every 6 (six) hours as needed for wheezing or shortness of breath. 01/05/15   Einar Pheasant, MD  benzonatate (TESSALON) 100 MG capsule Take 1 capsule (100 mg total) by mouth 3 (three) times daily as needed for cough. 04/15/19   Einar Pheasant, MD  citalopram (CELEXA) 20 MG tablet Take 1 tablet (20 mg total) by mouth daily. 04/25/19   Einar Pheasant, MD  cyclobenzaprine (FLEXERIL) 10 MG tablet Take 1 tablet by mouth at bedtime as needed. 03/01/19   [provider]  Dexlansoprazole (DEXILANT) 30 MG capsule Take 30 mg by mouth daily.    [provider]  folic acid (FOLVITE) 1 MG tablet  03/10/19   [provider]  hydrochlorothiazide (HYDRODIURIL) 12.5 MG tablet Take 1 tablet (12.5 mg total) by mouth daily. 02/08/19   Einar Pheasant, MD  levocetirizine (XYZAL) 5 MG tablet Take 5 mg by mouth at  bedtime.    [provider]  losartan (COZAAR) 50 MG tablet Take 1 tablet (50 mg total) by mouth daily. 02/08/19   Einar Pheasant, MD  medroxyPROGESTERone (DEPO-PROVERA) 150 MG/ML injection Inject 150 mg into the muscle every 3 (three) months.     [provider]  methotrexate (RHEUMATREX) 2.5 MG tablet Take 22.5 mg by mouth every Monday. Caution:Chemotherapy. Protect from light. Take 9 tabs po weekly with meals    [provider]  montelukast (SINGULAIR) 10 MG tablet take 1 tablet by mouth at bedtime 10/08/17   Einar Pheasant, MD  predniSONE (DELTASONE) 5 MG tablet Take 5 mg by mouth daily. 04/18/19   [provider]  Tocilizumab (ACTEMRA) 162 MG/0.9ML SOSY Inject into the skin. 05/03/19   [provider]  valACYclovir (VALTREX) 500 MG tablet Take one tablet bid x 3 days.  May repeat for flares as directed. 05/10/19   Einar Pheasant, MD    Family History  Problem Relation Age of Onset  . Cancer Mother        Breast Cancer  . Breast cancer Mother 58  . Cancer Father        Colon Cancer  . Heart disease Father        Hx of MI  . Hypertension Father   . Diabetes Father   . Cancer Maternal Grandmother        lung cancer  . Cancer Maternal Uncle        esophageal     Social History   Tobacco Use  . Smoking status: Never Smoker  . Smokeless tobacco: Never Used  Substance Use Topics  . Alcohol use: Yes    Alcohol/week: 0.0 standard drinks    Comment: occasionally  . Drug use: No    Allergies as of 07/06/2019 - Review Complete 06/18/2019  Allergen Reaction Noted  . Inh [isoniazid] Other (See Comments) 07/30/2012    Review of Systems:    All systems reviewed and negative except where noted in HPI.   Physical Exam:  There were no vitals taken for this visit. No LMP recorded. Patient has had an injection. Psych:  Alert and cooperative. Normal mood and affect. General:   Alert,  Well-developed, well-nourished, pleasant and  cooperative in NAD Head:  Normocephalic and atraumatic. Eyes:  Sclera clear, no icterus.   Conjunctiva pink. Ears:  Normal auditory acuity. Nose:  No deformity, discharge, or lesions. Mouth:  No deformity or lesions,oropharynx pink & moist. Neck:  Supple; no masses or thyromegaly. Lungs:  Respirations even and unlabored.  Clear throughout to auscultation.   No wheezes, crackles, or rhonchi. No acute distress. Heart:  Regular rate and rhythm; no murmurs, clicks, rubs, or gallops. Abdomen:  Normal bowel sounds.  No bruits.  Soft, non-tender and non-distended without masses, hepatosplenomegaly or hernias noted.  No guarding or rebound tenderness.    Msk:  Symmetrical without gross deformities. Good, equal movement & strength bilaterally. Pulses:  Normal pulses noted. Extremities:  No clubbing or  edema.  No cyanosis. Neurologic:  Alert and oriented x3;  grossly normal neurologically. Skin:  Intact without significant lesions or rashes. No jaundice. Lymph Nodes:  No significant cervical adenopathy. Psych:  Alert and cooperative. Normal mood and affect.  Imaging Studies: MR BRAIN W WO CONTRAST  Result Date: 07/04/2019 CLINICAL DATA:  Meningioma. Status post craniotomy December 2018. Creatinine was obtained on site at [facility] at [address] Results: Creatinine 0.9 mg/dL. EXAM: MRI HEAD WITHOUT AND WITH CONTRAST TECHNIQUE: Multiplanar, multiecho pulse sequences of the brain and surrounding structures were obtained without and with intravenous contrast. CONTRAST:  106mL MULTIHANCE GADOBENATE DIMEGLUMINE 529 MG/ML IV SOLN COMPARISON:  MRI of the head without and with contrast 07/12/2018 and 12/17/2017. MR head without and with contrast 04/20/2017. FINDINGS: Brain: Left pterional craniotomy is again noted. Encephalomalacia the left temporal tip is again noted. Although there is minimal dural thickening in the region, no significant residual or recurrent tumor is evident. No new  enhancement is present. Periventricular and subcortical T2 hyperintensities bilaterally are similar the prior exam, mildly advanced for age. No new lesions are present. There is no restricted diffusion. No acute hemorrhage or mass lesion is present otherwise. Previously noted T2 hyperintense lesion in the inferior fourth ventricle/foramen of [place] is stable, measuring 5 mm on axial images. The lesion measures 7 mm on the sagittal, not significantly changed. There is no enhancement associated with this lesion. No new lesions are present. Vascular: Flow is present in the major intracranial arteries. Skull and upper cervical spine: The craniocervical junction is normal. Upper cervical spine is within normal limits. Marrow signal is unremarkable. Sinuses/Orbits: The paranasal sinuses and mastoid air cells are clear. The globes and orbits are within normal limits. IMPRESSION: 1. Stable postoperative changes of the left middle cranial fossa and encephalomalacia of the temporal tip without evidence for residual or recurrent meningioma. 2. Stable T2 hyperintense lesion in the inferior fourth ventricle/foramen of [place]endie. 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 3. Stable atrophy and white matter disease. This likely reflects the sequela of chronic microvascular ischemia. 4. No acute intracranial abnormality or significant interval change. Electronically Signed   By: San Morelle M.D.   On: 07/04/2019 10:21    Assessment and Plan:   CATELYNN VANDERSLUIS is a 49 y.o. y/o female has been referred for dysphagia.  It is possible that her dysphagia is due to acid reflux worsening due to weight gain over the last few months.  Is also possible that her Schatzki's ring is closed up.  She has had eosinophilic esophagitis ruled out on prior endoscopy and she does suffer from allergies.  Her bloating is possibly due to constipation.  Plan 1.  EGD with dilation of Schatzki's ring and biopsies to rule out eosinophilic  esophagitis. 2.  GERD: Continue Dexilant: Counseled again on lifestyle changes. 3.  Bloating likely secondary to constipation.  Increase dietary fiber counseled on the same to target of 25 g/day.  Provided sample fiber pills to use as a supplement.  If no better I have suggested her to commence on half a capful of MiraLAX per day and we can gradually increase it. 4.  Repeat colonoscopy in 2024.  Last was in 2019 by Dr. Vira Agar which was normal.   I have discussed alternative options, risks & benefits,  which include, but are not limited to, bleeding, infection, perforation,respiratory complication & drug reaction.  The patient agrees with this plan & written consent will be obtained.     Follow  up in 6 weeks virtual visit  Dr Jonathon Bellows MD,MRCP(U.K)

## 2019-07-06 NOTE — Patient Instructions (Signed)

## 2019-07-11 ENCOUNTER — Ambulatory Visit
Admission: RE | Admit: 2019-07-11 | Discharge: 2019-07-11 | Disposition: A | Discharge status: Home / Self Care | Payer: 59 | Source: Ambulatory Visit | Attending: Internal Medicine | Admitting: Internal Medicine

## 2019-07-11 DIAGNOSIS — Z1231 Encounter for screening mammogram for malignant neoplasm of breast: Secondary | ICD-10-CM | POA: Diagnosis not present

## 2019-07-13 ENCOUNTER — Other Ambulatory Visit: Payer: Self-pay

## 2019-07-13 ENCOUNTER — Other Ambulatory Visit
Admission: RE | Admit: 2019-07-13 | Discharge: 2019-07-13 | Disposition: A | Discharge status: Home / Self Care | Payer: 59 | Source: Ambulatory Visit | Attending: Gastroenterology | Admitting: Gastroenterology

## 2019-07-13 DIAGNOSIS — Z20822 Contact with and (suspected) exposure to covid-19: Secondary | ICD-10-CM | POA: Insufficient documentation

## 2019-07-13 DIAGNOSIS — Z01812 Encounter for preprocedural laboratory examination: Secondary | ICD-10-CM | POA: Insufficient documentation

## 2019-07-13 LAB — SARS CORONAVIRUS 2 (TAT 6-24 HRS): SARS Coronavirus 2: NEGATIVE

## 2019-07-15 ENCOUNTER — Encounter
Admission: RE | Disposition: A | Discharge status: Home / Self Care | Payer: Self-pay | Source: Home / Self Care | Attending: Gastroenterology

## 2019-07-15 ENCOUNTER — Other Ambulatory Visit: Payer: Self-pay

## 2019-07-15 ENCOUNTER — Ambulatory Visit: Payer: 59 | Admitting: Registered Nurse

## 2019-07-15 ENCOUNTER — Ambulatory Visit
Admission: RE | Admit: 2019-07-15 | Discharge: 2019-07-15 | Disposition: A | Discharge status: Home / Self Care | Payer: 59 | Attending: Gastroenterology | Admitting: Gastroenterology

## 2019-07-15 DIAGNOSIS — K219 Gastro-esophageal reflux disease without esophagitis: Secondary | ICD-10-CM | POA: Insufficient documentation

## 2019-07-15 DIAGNOSIS — G43909 Migraine, unspecified, not intractable, without status migrainosus: Secondary | ICD-10-CM | POA: Insufficient documentation

## 2019-07-15 DIAGNOSIS — Z6833 Body mass index (BMI) 33.0-33.9, adult: Secondary | ICD-10-CM | POA: Diagnosis not present

## 2019-07-15 DIAGNOSIS — K449 Diaphragmatic hernia without obstruction or gangrene: Secondary | ICD-10-CM | POA: Insufficient documentation

## 2019-07-15 DIAGNOSIS — E7800 Pure hypercholesterolemia, unspecified: Secondary | ICD-10-CM | POA: Insufficient documentation

## 2019-07-15 DIAGNOSIS — K222 Esophageal obstruction: Secondary | ICD-10-CM | POA: Insufficient documentation

## 2019-07-15 DIAGNOSIS — Z8249 Family history of ischemic heart disease and other diseases of the circulatory system: Secondary | ICD-10-CM | POA: Diagnosis not present

## 2019-07-15 DIAGNOSIS — Z79899 Other long term (current) drug therapy: Secondary | ICD-10-CM | POA: Diagnosis not present

## 2019-07-15 DIAGNOSIS — R1310 Dysphagia, unspecified: Secondary | ICD-10-CM | POA: Diagnosis present

## 2019-07-15 DIAGNOSIS — E669 Obesity, unspecified: Secondary | ICD-10-CM | POA: Diagnosis not present

## 2019-07-15 DIAGNOSIS — Z7952 Long term (current) use of systemic steroids: Secondary | ICD-10-CM | POA: Insufficient documentation

## 2019-07-15 DIAGNOSIS — E0500 Thyrotoxicosis with diffuse goiter without thyrotoxic crisis or storm: Secondary | ICD-10-CM | POA: Diagnosis not present

## 2019-07-15 DIAGNOSIS — K2970 Gastritis, unspecified, without bleeding: Secondary | ICD-10-CM | POA: Diagnosis not present

## 2019-07-15 DIAGNOSIS — Z793 Long term (current) use of hormonal contraceptives: Secondary | ICD-10-CM | POA: Insufficient documentation

## 2019-07-15 DIAGNOSIS — Z801 Family history of malignant neoplasm of trachea, bronchus and lung: Secondary | ICD-10-CM | POA: Insufficient documentation

## 2019-07-15 DIAGNOSIS — I10 Essential (primary) hypertension: Secondary | ICD-10-CM | POA: Diagnosis not present

## 2019-07-15 DIAGNOSIS — M069 Rheumatoid arthritis, unspecified: Secondary | ICD-10-CM | POA: Insufficient documentation

## 2019-07-15 HISTORY — PX: ESOPHAGOGASTRODUODENOSCOPY (EGD) WITH PROPOFOL: SHX5813

## 2019-07-15 LAB — POCT PREGNANCY, URINE: Preg Test, Ur: NEGATIVE

## 2019-07-15 SURGERY — ESOPHAGOGASTRODUODENOSCOPY (EGD) WITH PROPOFOL
Anesthesia: General

## 2019-07-15 MED ORDER — PROPOFOL 10 MG/ML IV BOLUS
INTRAVENOUS | Status: DC | PRN
  Administered 2019-07-15: 20 mg via INTRAVENOUS
  Administered 2019-07-15: 40 mg via INTRAVENOUS
  Administered 2019-07-15: 80 mg via INTRAVENOUS
  Administered 2019-07-15: 20 mg via INTRAVENOUS

## 2019-07-15 MED ORDER — GLYCOPYRROLATE 0.2 MG/ML IJ SOLN
INTRAMUSCULAR | Status: DC | PRN
  Administered 2019-07-15: .2 mg via INTRAVENOUS

## 2019-07-15 MED ORDER — LIDOCAINE HCL (CARDIAC) PF 100 MG/5ML IV SOSY
PREFILLED_SYRINGE | INTRAVENOUS | Status: DC | PRN
  Administered 2019-07-15: 100 mg via INTRAVENOUS

## 2019-07-15 MED ORDER — SODIUM CHLORIDE 0.9 % IV SOLN
INTRAVENOUS | Status: DC
  Administered 2019-07-15: 1000 mL via INTRAVENOUS

## 2019-07-15 NOTE — Anesthesia Preprocedure Evaluation (Signed)
Anesthesia Evaluation  Patient identified by MRN, date of birth, ID band Patient awake    Reviewed: Allergy & Precautions, NPO status , Patient's Chart, lab work & pertinent test results  History of Anesthesia Complications Negative for: history of anesthetic complications  Airway Mallampati: II  TM Distance: >3 FB Neck ROM: Full    Dental no notable dental hx.    Pulmonary neg pulmonary ROS, neg sleep apnea, neg COPD,    breath sounds clear to auscultation- rhonchi (-) wheezing      Cardiovascular hypertension, Pt. on medications (-) CAD, (-) Past MI, (-) Cardiac Stents and (-) CABG  Rhythm:Regular Rate:Normal - Systolic murmurs and - Diastolic murmurs    Neuro/Psych  Headaches, neg Seizures Anxiety    GI/Hepatic Neg liver ROS, hiatal hernia, GERD  ,  Endo/Other  neg diabetesHypothyroidism   Renal/GU      Musculoskeletal  (+) Arthritis ,   Abdominal (+) + obese,   Peds  Hematology  (+) anemia ,   Anesthesia Other Findings Past Medical History: No date: Allergic rhinitis No date: Anemia No date: Arthritis     Comment:  RA No date: Carpal tunnel syndrome No date: Dysphagia 01/16/2014: Esophageal spasm No date: GERD (gastroesophageal reflux disease) No date: Graves disease     Comment:  remission, no ablation, positive medical treatment No date: History of hiatal hernia No date: Hypertension No date: Migraine headache     Comment:  migraines No date: PPD positive     Comment:  hepatitis secondary to INH No date: Pure hypercholesterolemia No date: Rheumatoid arthritis(714.0)     Comment:  positive anti CCP antibodies, positive RF,               oligo-articular, MTX   Reproductive/Obstetrics                             Anesthesia Physical Anesthesia Plan  ASA: III  Anesthesia Plan: General   Post-op Pain Management:    Induction: Intravenous  PONV Risk Score and Plan: 2  and Propofol infusion  Airway Management Planned: Natural Airway  Additional Equipment:   Intra-op Plan:   Post-operative Plan:   Informed Consent: I have reviewed the patients History and Physical, chart, labs and discussed the procedure including the risks, benefits and alternatives for the proposed anesthesia with the patient or authorized representative who has indicated his/her understanding and acceptance.     Dental advisory given  Plan Discussed with: CRNA and Anesthesiologist  Anesthesia Plan Comments:         Anesthesia Quick Evaluation

## 2019-07-15 NOTE — Op Note (Signed)
[facility] Gastroenterology Patient Name: Tina Parker Procedure Date: 07/15/2019 8:24 AM MRN: [id number] Account #: [id number] Date of Birth: 04-12-1971 Admit Type: Outpatient Age: 49 Room: [facility] ENDO ROOM 3 Gender: Female Note Status: Finalized Procedure:             Upper GI endoscopy Indications:           Dysphagia Providers:             Jonathon Bellows MD, MD Referring MD:          Einar Pheasant, MD (Referring MD) Medicines:             Monitored Anesthesia Care Complications:         No immediate complications. Procedure:             Pre-Anesthesia Assessment:                        - Prior to the procedure, a History and Physical was                         performed, and patient medications, allergies and                         sensitivities were reviewed. The patient's tolerance                         of previous anesthesia was reviewed.                        - The risks and benefits of the procedure and the                         sedation options and risks were discussed with the                         patient. All questions were answered and informed                         consent was obtained.                        - ASA Grade Assessment: II - A patient with mild                         systemic disease.                        After obtaining informed consent, the endoscope was                         passed under direct vision. Throughout the procedure,                         the patient's blood pressure, pulse, and oxygen                         saturations were monitored continuously. The Endoscope                         was introduced through the mouth, and advanced to  the                         third part of duodenum. The upper GI endoscopy was                         accomplished with ease. The patient tolerated the                         procedure well. Findings:      The examined duodenum was normal.      Localized moderate  inflammation characterized by congestion (edema),       erosions and erythema was found on the greater curvature of the stomach.       Biopsies were taken with a cold forceps for histology.      A small hiatal hernia was present.      One benign-appearing, intrinsic mild stenosis was found at the       gastroesophageal junction. This stenosis measured 1.6 cm (inner       diameter) x less than one cm (in length). The stenosis was traversed. A       TTS dilator was passed through the scope. Dilation with a 15-16.5-18 mm       balloon dilator was performed to 18 mm. The dilation site was examined       and showed no change. This was biopsied with a cold forceps for       evaluation of eosinophilic esophagitis.      The cardia and gastric fundus were normal on retroflexion. Impression:            - Normal examined duodenum.                        - Gastritis. Biopsied.                        - Small hiatal hernia.                        - Benign-appearing esophageal stenosis. Dilated.                         Biopsied. Recommendation:        - Discharge patient to home (with escort).                        - Resume previous diet.                        - Continue present medications.                        - Await pathology results.                        - Return to my office as previously scheduled. Procedure Code(s):     --- Professional ---                        [phone], Esophagogastroduodenoscopy, flexible,                         transoral; with transendoscopic balloon dilation of  esophagus (less than 30 mm diameter)                        43239, 59, Esophagogastroduodenoscopy, flexible,                         transoral; with biopsy, single or multiple Diagnosis Code(s):     --- Professional ---                        K29.70, Gastritis, unspecified, without bleeding                        K44.9, Diaphragmatic hernia without obstruction or                          gangrene                        K22.2, Esophageal obstruction                        R13.10, Dysphagia, unspecified CPT copyright 2019 American Medical Association. All rights reserved. The codes documented in this report are preliminary and upon coder review may  be revised to meet current compliance requirements. Jonathon Bellows, MD Jonathon Bellows MD, MD 07/15/2019 8:56:49 AM This report has been signed electronically. Number of Addenda: 0 Note Initiated On: 07/15/2019 8:24 AM Estimated Blood Loss:  Estimated blood loss: none.      [facility]

## 2019-07-15 NOTE — Anesthesia Postprocedure Evaluation (Signed)
Anesthesia Post Note  Patient: [place]  Procedure(s) Performed: ESOPHAGOGASTRODUODENOSCOPY (EGD) WITH PROPOFOL (N/A )  Patient location during evaluation: Endoscopy Anesthesia Type: General Level of consciousness: awake and alert and oriented Pain management: pain level controlled Vital Signs Assessment: post-procedure vital signs reviewed and stable Respiratory status: spontaneous breathing, nonlabored ventilation and respiratory function stable Cardiovascular status: blood pressure returned to baseline and stable Postop Assessment: no signs of nausea or vomiting Anesthetic complications: no     Last Vitals:  Vitals:   07/15/19 0900 07/15/19 0910  BP: 114/80 119/77  Pulse: (!) 115 (!) 114  Resp: 17 14  Temp:    SpO2: 99% 100%    Last Pain:  Vitals:   07/15/19 0859  TempSrc: Temporal  PainSc:                  Audiel Scheiber

## 2019-07-15 NOTE — Transfer of Care (Signed)
Immediate Anesthesia Transfer of Care Note  Patient: [place]  Procedure(s) Performed: ESOPHAGOGASTRODUODENOSCOPY (EGD) WITH PROPOFOL (N/A )  Patient Location: PACU  Anesthesia Type:General  Level of Consciousness: sedated  Airway & Oxygen Therapy: Patient Spontanous Breathing  Post-op Assessment: Report given to RN and Post -op Vital signs reviewed and stable  Post vital signs: Reviewed and stable  Last Vitals:  Vitals Value Taken Time  BP 114/80 07/15/19 0858  Temp 36 C 07/15/19 0858  Pulse 120 07/15/19 0858  Resp 19 07/15/19 0858  SpO2 98 % 07/15/19 0858  Vitals shown include unvalidated device data.  Last Pain:  Vitals:   07/15/19 0816  TempSrc: Temporal  PainSc: 0-No pain         Complications: No apparent anesthesia complications

## 2019-07-15 NOTE — H&P (Signed)
Jonathon Bellows, MD [address], [place], [place], [place], [address] [address], [place], [place], [place], [address] Phone: [phone]  Fax: [phone]  Primary Care Physician:  Einar Pheasant, MD   Pre-Procedure History & Physical: HPI:  Tina Parker is a 49 y.o. female is here for an endoscopy    Past Medical History:  Diagnosis Date  . Allergic rhinitis   . Anemia   . Arthritis    RA  . Carpal tunnel syndrome   . Dysphagia   . Esophageal spasm 01/16/2014  . GERD (gastroesophageal reflux disease)   . Graves disease    remission, no ablation, positive medical treatment  . History of hiatal hernia   . Hypertension   . Migraine headache    migraines  . PPD positive    hepatitis secondary to [place]  . Pure hypercholesterolemia   . Rheumatoid arthritis(714.0)    positive anti CCP antibodies, positive RF, oligo-articular, MTX    Past Surgical History:  Procedure Laterality Date  . CARPAL TUNNEL RELEASE Right   . CESAREAN SECTION  1996  . COLONOSCOPY  03/14/2002, 08/16/2007, 03/07/2013   [facility]-father  . COLONOSCOPY WITH PROPOFOL N/A 06/21/2018   Procedure: COLONOSCOPY WITH PROPOFOL;  Surgeon: Manya Silvas, MD;  Location: [facility] ENDOSCOPY;  Service: Endoscopy;  Laterality: N/A;  . CRANIOTOMY Left 06/25/2017   Procedure: CRANIOTOMY TEMPORAL LEFT FOR TUMOR RESECTION;  Surgeon: Ashok Pall, MD;  Location: [facility];  Service: Neurosurgery;  Laterality: Left;  . ESOPHAGOGASTRODUODENOSCOPY  03/14/2002, 03/07/2013  . ESOPHAGOGASTRODUODENOSCOPY (EGD) WITH PROPOFOL N/A 06/21/2018   Procedure: ESOPHAGOGASTRODUODENOSCOPY (EGD) WITH PROPOFOL;  Surgeon: Manya Silvas, MD;  Location: [facility] ENDOSCOPY;  Service: Endoscopy;  Laterality: N/A;  . Post Septoplasty and turbinate reduction  2007  . TRIGGER FINGER RELEASE      Prior to Admission medications   Medication Sig Start Date End Date Taking? Authorizing Provider  albuterol (PROVENTIL HFA;VENTOLIN HFA) 108 (90 BASE)  MCG/ACT inhaler Inhale 2 puffs into the lungs every 6 (six) hours as needed for wheezing or shortness of breath. 01/05/15  Yes Einar Pheasant, MD  benzonatate (TESSALON) 100 MG capsule Take 1 capsule (100 mg total) by mouth 3 (three) times daily as needed for cough. 04/15/19  Yes Einar Pheasant, MD  citalopram (CELEXA) 20 MG tablet Take 1 tablet (20 mg total) by mouth daily. 04/25/19  Yes Einar Pheasant, MD  cyclobenzaprine (FLEXERIL) 10 MG tablet Take 1 tablet by mouth at bedtime as needed. 03/01/19  Yes [provider]  Dexlansoprazole (DEXILANT) 30 MG capsule Take 30 mg by mouth daily.   Yes [provider]  folic acid (FOLVITE) 1 MG tablet  03/10/19  Yes [provider]  hydrochlorothiazide (HYDRODIURIL) 12.5 MG tablet Take 1 tablet (12.5 mg total) by mouth daily. 02/08/19  Yes Einar Pheasant, MD  levocetirizine (XYZAL) 5 MG tablet Take 5 mg by mouth at bedtime.   Yes [provider]  losartan (COZAAR) 50 MG tablet Take 1 tablet (50 mg total) by mouth daily. 02/08/19  Yes Einar Pheasant, MD  medroxyPROGESTERone (DEPO-PROVERA) 150 MG/ML injection Inject 150 mg into the muscle every 3 (three) months.    Yes [provider]  methotrexate (RHEUMATREX) 2.5 MG tablet Take 22.5 mg by mouth every Monday. Caution:Chemotherapy. Protect from light. Take 9 tabs po weekly with meals   Yes [provider]  montelukast (SINGULAIR) 10 MG tablet take 1 tablet by mouth at bedtime 10/08/17  Yes Einar Pheasant, MD  predniSONE (DELTASONE) 5 MG tablet Take 5 mg by mouth daily. 04/18/19  Yes [provider]  Tocilizumab (ACTEMRA) 162 MG/0.9ML SOSY Inject into the skin. 05/03/19  Yes [provider]  valACYclovir (VALTREX) 500 MG tablet Take one tablet bid x 3 days.  May repeat for flares as directed. 05/10/19  Yes Einar Pheasant, MD    Allergies as of 07/06/2019 - Review Complete 07/06/2019  Allergen Reaction Noted  . Inh [isoniazid] Other (See  Comments) 07/30/2012    Family History  Problem Relation Age of Onset  . Cancer Mother        Breast Cancer  . Breast cancer Mother 2  . Cancer Father        Colon Cancer  . Heart disease Father        Hx of MI  . Hypertension Father   . Diabetes Father   . Cancer Maternal Grandmother        lung cancer  . Cancer Maternal Uncle        esophageal    Social History   Socioeconomic History  . Marital status: Married    Spouse name: Not on file  . Number of children: Not on file  . Years of education: Not on file  . Highest education level: Not on file  Occupational History  . Not on file  Tobacco Use  . Smoking status: Never Smoker  . Smokeless tobacco: Never Used  Substance and Sexual Activity  . Alcohol use: Yes    Alcohol/week: 0.0 standard drinks    Comment: occasionally  . Drug use: No  . Sexual activity: Not on file  Other Topics Concern  . Not on file  Social History Narrative   Hairdresser    Social Determinants of Health   Financial Resource Strain:   . Difficulty of Paying Living Expenses: Not on file  Food Insecurity:   . Worried About Charity fundraiser in the Last Year: Not on file  . Ran Out of Food in the Last Year: Not on file  Transportation Needs:   . Lack of Transportation (Medical): Not on file  . Lack of Transportation (Non-Medical): Not on file  Physical Activity:   . Days of Exercise per Week: Not on file  . Minutes of Exercise per Session: Not on file  Stress:   . Feeling of Stress : Not on file  Social Connections:   . Frequency of Communication with Friends and Family: Not on file  . Frequency of Social Gatherings with Friends and Family: Not on file  . Attends Religious Services: Not on file  . Active Member of Clubs or Organizations: Not on file  . Attends Archivist Meetings: Not on file  . Marital Status: Not on file  Intimate Partner Violence:   . Fear of Current or Ex-Partner: Not on file  . Emotionally  Abused: Not on file  . Physically Abused: Not on file  . Sexually Abused: Not on file    Review of Systems: See HPI, otherwise negative ROS  Physical Exam: BP (!) 139/105   Pulse (!) 101   Temp 97.9 F (36.6 C) (Temporal)   Resp 20   Ht 5\' 1"  (1.549 m)   Wt 81.2 kg   SpO2 100%   BMI 33.82 kg/m  General:   Alert,  pleasant and cooperative in NAD Head:  Normocephalic and atraumatic. Neck:  Supple; no masses or thyromegaly. Lungs:  Clear throughout to auscultation, normal respiratory effort.  Heart:  +S1, +S2, Regular rate and rhythm, No edema. Abdomen:  Soft, nontender and nondistended. Normal bowel sounds, without guarding, and without rebound.   Neurologic:  Alert and  oriented x4;  grossly normal neurologically.  Impression/Plan: [name] is here for an endoscopy  to be performed for  evaluation of dysphagia    Risks, benefits, limitations, and alternatives regarding endoscopy have been reviewed with the patient.  Questions have been answered.  All parties agreeable.   Jonathon Bellows, MD  07/15/2019, 8:21 AM

## 2019-07-18 ENCOUNTER — Encounter: Payer: Self-pay | Admitting: *Deleted

## 2019-07-18 LAB — SURGICAL PATHOLOGY

## 2019-07-22 ENCOUNTER — Other Ambulatory Visit: Payer: Self-pay

## 2019-07-22 MED ORDER — DEXILANT 30 MG PO CPDR: 30.0000 mg | DELAYED_RELEASE_CAPSULE | Freq: Every day | ORAL | 1 refills | Status: DC

## 2019-07-22 NOTE — Progress Notes (Signed)
Inform normal biopsies

## 2019-07-22 NOTE — Telephone Encounter (Signed)
Yes can continue- give refills

## 2019-07-25 ENCOUNTER — Other Ambulatory Visit: Payer: Self-pay

## 2019-07-25 ENCOUNTER — Ambulatory Visit: Payer: 59

## 2019-07-25 DIAGNOSIS — G4733 Obstructive sleep apnea (adult) (pediatric): Secondary | ICD-10-CM | POA: Diagnosis not present

## 2019-07-25 DIAGNOSIS — R0683 Snoring: Secondary | ICD-10-CM

## 2019-08-01 ENCOUNTER — Telehealth: Payer: Self-pay | Admitting: Pulmonary Disease

## 2019-08-01 DIAGNOSIS — G4733 Obstructive sleep apnea (adult) (pediatric): Secondary | ICD-10-CM | POA: Diagnosis not present

## 2019-08-01 NOTE — Telephone Encounter (Signed)
HST 07/25/19 >> AHI 6.2, SpO2 low 88%   Please inform her that her sleep study shows mild obstructive sleep apnea.  Please arrange for ROV with me or NP to discuss treatment options.

## 2019-08-02 ENCOUNTER — Ambulatory Visit: Payer: 59

## 2019-08-02 NOTE — Telephone Encounter (Signed)
Called the patient and advised her of the results. Patient voiced understanding. Set up for video visit with Derl Barrow, NP 08/08/19. Nothing further needed.

## 2019-08-08 ENCOUNTER — Encounter: Payer: Self-pay | Admitting: Primary Care

## 2019-08-08 ENCOUNTER — Telehealth (INDEPENDENT_AMBULATORY_CARE_PROVIDER_SITE_OTHER): Payer: 59 | Admitting: Primary Care

## 2019-08-08 ENCOUNTER — Other Ambulatory Visit: Payer: Self-pay

## 2019-08-08 DIAGNOSIS — G4733 Obstructive sleep apnea (adult) (pediatric): Secondary | ICD-10-CM | POA: Diagnosis not present

## 2019-08-08 DIAGNOSIS — G4730 Sleep apnea, unspecified: Secondary | ICD-10-CM | POA: Insufficient documentation

## 2019-08-08 NOTE — Patient Instructions (Addendum)
Home sleep test showed mild sleep apnea; AHI 6.2/hr  Recommend wearing CPAP every night 4-6 hours or more Work on weight loss (10%- 17 lbs) Do not take sedating medication or drink alcohol in excess prior to bedtime as these can worsen sleep apnea Do not drive if experiencing excessive daytime fatigue or somnolence   Orders: New CPAP start- auto titrate 5-15cm h20, mask of choice, supplies and humidification  Follow-up: 2 month follow-up with Dr. Halford Chessman or NP     Sleep Apnea Sleep apnea affects breathing during sleep. It causes breathing to stop for a short time or to become shallow. It can also increase the risk of:  Heart attack.  Stroke.  Being very overweight (obese).  Diabetes.  Heart failure.  Irregular heartbeat. The goal of treatment is to help you breathe normally again. What are the causes? There are three kinds of sleep apnea:  Obstructive sleep apnea. This is caused by a blocked or collapsed airway.  Central sleep apnea. This happens when the brain does not send the right signals to the muscles that control breathing.  Mixed sleep apnea. This is a combination of obstructive and central sleep apnea. The most common cause of this condition is a collapsed or blocked airway. This can happen if:  Your throat muscles are too relaxed.  Your tongue and tonsils are too large.  You are overweight.  Your airway is too small. What increases the risk?  Being overweight.  Smoking.  Having a small airway.  Being older.  Being female.  Drinking alcohol.  Taking medicines to calm yourself (sedatives or tranquilizers).  Having family members with the condition. What are the signs or symptoms?  Trouble staying asleep.  Being sleepy or tired during the day.  Getting angry a lot.  Loud snoring.  Headaches in the morning.  Not being able to focus your mind (concentrate).  Forgetting things.  Less interest in sex.  Mood swings.  Personality  changes.  Feelings of sadness (depression).  Waking up a lot during the night to pee (urinate).  Dry mouth.  Sore throat. How is this diagnosed?  Your medical history.  A physical exam.  A test that is done when you are sleeping (sleep study). The test is most often done in a sleep lab but may also be done at home. How is this treated?   Sleeping on your side.  Using a medicine to get rid of mucus in your nose (decongestant).  Avoiding the use of alcohol, medicines to help you relax, or certain pain medicines (narcotics).  Losing weight, if needed.  Changing your diet.  Not smoking.  Using a machine to open your airway while you sleep, such as: ? An oral appliance. This is a mouthpiece that shifts your lower jaw forward. ? A CPAP device. This device blows air through a mask when you breathe out (exhale). ? An EPAP device. This has valves that you put in each nostril. ? A BPAP device. This device blows air through a mask when you breathe in (inhale) and breathe out.  Having surgery if other treatments do not work. It is important to get treatment for sleep apnea. Without treatment, it can lead to:  High blood pressure.  Coronary artery disease.  In men, not being able to have an erection (impotence).  Reduced thinking ability. Follow these instructions at home: Lifestyle  Make changes that your doctor recommends.  Eat a healthy diet.  Lose weight if needed.  Avoid alcohol, medicines  to help you relax, and some pain medicines.  Do not use any products that contain nicotine or tobacco, such as cigarettes, e-cigarettes, and chewing tobacco. If you need help quitting, ask your doctor. General instructions  Take over-the-counter and prescription medicines only as told by your doctor.  If you were given a machine to use while you sleep, use it only as told by your doctor.  If you are having surgery, make sure to tell your doctor you have sleep apnea. You may  need to bring your device with you.  Keep all follow-up visits as told by your doctor. This is important. Contact a doctor if:  The machine that you were given to use during sleep bothers you or does not seem to be working.  You do not get better.  You get worse. Get help right away if:  Your chest hurts.  You have trouble breathing in enough air.  You have an uncomfortable feeling in your back, arms, or stomach.  You have trouble talking.  One side of your body feels weak.  A part of your face is hanging down. These symptoms may be an emergency. Do not wait to see if the symptoms will go away. Get medical help right away. Call your local emergency services (911 in the [place] Do not drive yourself to the hospital. Summary  This condition affects breathing during sleep.  The most common cause is a collapsed or blocked airway.  The goal of treatment is to help you breathe normally while you sleep. This information is not intended to replace advice given to you by your health care provider. Make sure you discuss any questions you have with your health care provider. Document Revised: 04/02/2018 Document Reviewed: 02/09/2018 Elsevier Patient Education  [place].  CPAP and BPAP Information CPAP and BPAP are methods of helping a person breathe with the use of air pressure. CPAP stands for "continuous positive airway pressure." BPAP stands for "bi-level positive airway pressure." In both methods, air is blown through your nose or mouth and into your air passages to help you breathe well. CPAP and BPAP use different amounts of pressure to blow air. With CPAP, the amount of pressure stays the same while you breathe in and out. With BPAP, the amount of pressure is increased when you breathe in (inhale) so that you can take larger breaths. Your health care provider will recommend whether CPAP or BPAP would be more helpful for you. Why are CPAP and BPAP treatments used? CPAP or  BPAP can be helpful if you have:  Sleep apnea.  Chronic obstructive pulmonary disease (COPD).  Heart failure.  Medical conditions that weaken the muscles of the chest including muscular dystrophy, or neurological diseases such as amyotrophic lateral sclerosis (ALS).  Other problems that cause breathing to be weak, abnormal, or difficult. CPAP is most commonly used for obstructive sleep apnea (OSA) to keep the airways from collapsing when the muscles relax during sleep. How is CPAP or BPAP administered? Both CPAP and BPAP are provided by a small machine with a flexible plastic tube that attaches to a plastic mask. You wear the mask. Air is blown through the mask into your nose or mouth. The amount of pressure that is used to blow the air can be adjusted on the machine. Your health care provider will determine the pressure setting that should be used based on your individual needs. When should CPAP or BPAP be used? In most cases, the mask only needs to  be worn during sleep. Generally, the mask needs to be worn throughout the night and during any daytime naps. People with certain medical conditions may also need to wear the mask at other times when they are awake. Follow instructions from your health care provider about when to use the machine. What are some tips for using the mask?   Because the mask needs to be snug, some people feel trapped or closed-in (claustrophobic) when first using the mask. If you feel this way, you may need to get used to the mask. One way to do this is by holding the mask loosely over your nose or mouth and then gradually applying the mask more snugly. You can also gradually increase the amount of time that you use the mask.  Masks are available in various types and sizes. Some fit over your mouth and nose while others fit over just your nose. If your mask does not fit well, talk with your health care provider about getting a different one.  If you are using a mask  that fits over your nose and you tend to breathe through your mouth, a chin strap may be applied to help keep your mouth closed.  The CPAP and BPAP machines have alarms that may sound if the mask comes off or develops a leak.  If you have trouble with the mask, it is very important that you talk with your health care provider about finding a way to make the mask easier to tolerate. Do not stop using the mask. Stopping the use of the mask could have a negative impact on your health. What are some tips for using the machine?  Place your CPAP or BPAP machine on a secure table or stand near an electrical outlet.  Know where the on/off switch is located on the machine.  Follow instructions from your health care provider about how to set the pressure on your machine and when you should use it.  Do not eat or drink while the CPAP or BPAP machine is on. Food or fluids could get pushed into your lungs by the pressure of the CPAP or BPAP.  Do not smoke. Tobacco smoke residue can damage the machine.  For home use, CPAP and BPAP machines can be rented or purchased through home health care companies. Many different brands of machines are available. Renting a machine before purchasing may help you find out which particular machine works well for you.  Keep the CPAP or BPAP machine and attachments clean. Ask your health care provider for specific instructions. Get help right away if:  You have redness or open areas around your nose or mouth where the mask fits.  You have trouble using the CPAP or BPAP machine.  You cannot tolerate wearing the CPAP or BPAP mask.  You have pain, discomfort, and bloating in your abdomen. Summary  CPAP and BPAP are methods of helping a person breathe with the use of air pressure.  Both CPAP and BPAP are provided by a small machine with a flexible plastic tube that attaches to a plastic mask.  If you have trouble with the mask, it is very important that you talk with  your health care provider about finding a way to make the mask easier to tolerate. This information is not intended to replace advice given to you by your health care provider. Make sure you discuss any questions you have with your health care provider. Document Revised: 10/06/2018 Document Reviewed: 05/05/2016 Elsevier Patient Education  [place].    CPAP and BPAP Information CPAP and BPAP are methods of helping a person breathe with the use of air pressure. CPAP stands for "continuous positive airway pressure." BPAP stands for "bi-level positive airway pressure." In both methods, air is blown through your nose or mouth and into your air passages to help you breathe well. CPAP and BPAP use different amounts of pressure to blow air. With CPAP, the amount of pressure stays the same while you breathe in and out. With BPAP, the amount of pressure is increased when you breathe in (inhale) so that you can take larger breaths. Your health care provider will recommend whether CPAP or BPAP would be more helpful for you. Why are CPAP and BPAP treatments used? CPAP or BPAP can be helpful if you have:  Sleep apnea.  Chronic obstructive pulmonary disease (COPD).  Heart failure.  Medical conditions that weaken the muscles of the chest including muscular dystrophy, or neurological diseases such as amyotrophic lateral sclerosis (ALS).  Other problems that cause breathing to be weak, abnormal, or difficult. CPAP is most commonly used for obstructive sleep apnea (OSA) to keep the airways from collapsing when the muscles relax during sleep. How is CPAP or BPAP administered? Both CPAP and BPAP are provided by a small machine with a flexible plastic tube that attaches to a plastic mask. You wear the mask. Air is blown through the mask into your nose or mouth. The amount of pressure that is used to blow the air can be adjusted on the machine. Your health care provider will determine the pressure setting  that should be used based on your individual needs. When should CPAP or BPAP be used? In most cases, the mask only needs to be worn during sleep. Generally, the mask needs to be worn throughout the night and during any daytime naps. People with certain medical conditions may also need to wear the mask at other times when they are awake. Follow instructions from your health care provider about when to use the machine. What are some tips for using the mask?   Because the mask needs to be snug, some people feel trapped or closed-in (claustrophobic) when first using the mask. If you feel this way, you may need to get used to the mask. One way to do this is by holding the mask loosely over your nose or mouth and then gradually applying the mask more snugly. You can also gradually increase the amount of time that you use the mask.  Masks are available in various types and sizes. Some fit over your mouth and nose while others fit over just your nose. If your mask does not fit well, talk with your health care provider about getting a different one.  If you are using a mask that fits over your nose and you tend to breathe through your mouth, a chin strap may be applied to help keep your mouth closed.  The CPAP and BPAP machines have alarms that may sound if the mask comes off or develops a leak.  If you have trouble with the mask, it is very important that you talk with your health care provider about finding a way to make the mask easier to tolerate. Do not stop using the mask. Stopping the use of the mask could have a negative impact on your health. What are some tips for using the machine?  Place your CPAP or BPAP machine on a secure table or stand near an electrical  outlet.  Know where the on/off switch is located on the machine.  Follow instructions from your health care provider about how to set the pressure on your machine and when you should use it.  Do not eat or drink while the CPAP or BPAP  machine is on. Food or fluids could get pushed into your lungs by the pressure of the CPAP or BPAP.  Do not smoke. Tobacco smoke residue can damage the machine.  For home use, CPAP and BPAP machines can be rented or purchased through home health care companies. Many different brands of machines are available. Renting a machine before purchasing may help you find out which particular machine works well for you.  Keep the CPAP or BPAP machine and attachments clean. Ask your health care provider for specific instructions. Get help right away if:  You have redness or open areas around your nose or mouth where the mask fits.  You have trouble using the CPAP or BPAP machine.  You cannot tolerate wearing the CPAP or BPAP mask.  You have pain, discomfort, and bloating in your abdomen. Summary  CPAP and BPAP are methods of helping a person breathe with the use of air pressure.  Both CPAP and BPAP are provided by a small machine with a flexible plastic tube that attaches to a plastic mask.  If you have trouble with the mask, it is very important that you talk with your health care provider about finding a way to make the mask easier to tolerate. This information is not intended to replace advice given to you by your health care provider. Make sure you discuss any questions you have with your health care provider. Document Revised: 10/06/2018 Document Reviewed: 05/05/2016 Elsevier Patient Education  [place].

## 2019-08-08 NOTE — Progress Notes (Signed)
  Virtual Visit via Video Note  I connected with Tina Parker on 08/08/19 at 10:30 AM EST by a video enabled telemedicine application and verified that I am speaking with the correct person using two identifiers.  Location: Patient: Home Provider: Office   I discussed the limitations of evaluation and management by telemedicine and the availability of in person appointments. The patient expressed understanding and agreed to proceed.  History of Present Illness: 49 year old female, never smoked.  Past medical history significant for snoring.  Patient of Dr. Halford Chessman, seen for initial sleep consult back in November 2020 for snoring and sleep disruption. Epworth 10. HST in 2018 showed AHI 3.5 (weight 160lbs).    08/08/2019 Patient contacted today for virtual video visit to go over home sleep test results.  HST on 07/25/2019 showed an average AHI 6.2/hour with minimum SpO2 of 88%.  Discussed treatment options for mild obstructive sleep apnea including weight loss, CPAP, oral appliance and ENT/surgical evaluation. Her sleep is disruptive enough that she would like to try CPAP therapy at this time while also working to lose weight.    Observations/Objective:  - Appears well, able to speak in full sentences - No shortness of breath, wheezing or cough  Assessment and Plan:  Mild OSA: - HST 07/25/19 showed AHI 6.2/hr (weight 179lbs) - Discussed treatment options and patient elected to proceed with CPAP therapy - Referral for new CPAP auto titrate 5-15cm h20, mask of choice, supplies and humidification - Advised patient to wear every night for 4-6 hours or more - Do not take sedating medication or drink alcohol in excess prior to bedtime as these can worsen sleep apnea - Do not drive if experiencing excessive daytime fatigue or somnolence - Continue to work on weight loss and staying physically active   Follow Up Instructions:   - 2 months with Dr. Halford Chessman or Eustaquio Maize NP   I discussed the assessment  and treatment plan with the patient. The patient was provided an opportunity to ask questions and all were answered. The patient agreed with the plan and demonstrated an understanding of the instructions.   The patient was advised to call back or seek an in-person evaluation if the symptoms worsen or if the condition fails to improve as anticipated.  I provided 22 minutes of non-face-to-face time during this encounter.   Martyn Ehrich, NP

## 2019-08-08 NOTE — Progress Notes (Signed)
Reviewed and agree with assessment/plan.   Sherrill Buikema, MD [facility] 06/25/2016, 12:24 PM Pager:  [phone]  

## 2019-08-10 ENCOUNTER — Ambulatory Visit (INDEPENDENT_AMBULATORY_CARE_PROVIDER_SITE_OTHER): Payer: 59 | Admitting: Lab

## 2019-08-10 ENCOUNTER — Other Ambulatory Visit: Payer: Self-pay

## 2019-08-10 DIAGNOSIS — Z3042 Encounter for surveillance of injectable contraceptive: Secondary | ICD-10-CM | POA: Diagnosis not present

## 2019-08-10 MED ORDER — MEDROXYPROGESTERONE ACETATE 150 MG/ML IM SUSP
150.0000 mg | Freq: Once | INTRAMUSCULAR | Status: AC
  Administered 2019-08-10: 150 mg via INTRAMUSCULAR

## 2019-08-10 NOTE — Progress Notes (Addendum)
Pt in office today for Depo injection in L-Deltoid. Pt tolerated well.  Reviewed.  Dr Nicki Reaper

## 2019-08-22 ENCOUNTER — Ambulatory Visit: Payer: 59 | Admitting: Gastroenterology

## 2019-08-22 DIAGNOSIS — K219 Gastro-esophageal reflux disease without esophagitis: Secondary | ICD-10-CM | POA: Diagnosis not present

## 2019-08-22 DIAGNOSIS — R1310 Dysphagia, unspecified: Secondary | ICD-10-CM | POA: Diagnosis not present

## 2019-08-22 DIAGNOSIS — K449 Diaphragmatic hernia without obstruction or gangrene: Secondary | ICD-10-CM

## 2019-08-22 DIAGNOSIS — K5900 Constipation, unspecified: Secondary | ICD-10-CM

## 2019-08-22 NOTE — Patient Instructions (Signed)

## 2019-08-22 NOTE — Progress Notes (Signed)
Jonathon Bellows , MD [address]  [place]  [place], [place] [address]  Main: [phone]  Fax: [phone]   Primary Care Physician: Einar Pheasant, MD  History of Present Illness:  Dysphagia follow up  HPI: Tina Parker is a 49 y.o. female   Summary of history : Referred for dysphagia  In 07/2019,previously a patient of Dr. Vira Agar at [facility] [place] [facility] last seen at their office in 03/19/2018 for acid reflux.   06/18/2018 colonoscopy: Normal except for internal hemorrhoids 06/18/2018 EGD: Schatzki's ring dilated to 16 mm no biopsies of the esophagus taken biopsies of the stomach taken due to suspicion for gastritis. Which were negative for pathology.  She did feel better after her last dilation but gradually the dysphagia returned.  More for solids and liquids.  Has gained weight.  On steroids for her rheumatoid arthritis.  Occasional use of NSAIDs.  Her other main complaint is bloating.  Significant abdominal distention.  Gas is foul-smelling.  Does not have a bowel movement every day.  Probably every other day.  Takes Dulcolax as needed.  Does not feel satisfied after a bowel movement.    Interval history   07/06/2019-08/22/2019  07/15/2019: EGD: Gastritis, GE junction stricture noted and dilated to 18 mm. Normal gastric and esophageal biopsies.   Since her upper endoscopy she has not had any issues with dysphagia.  She claims that she has gained 20 pounds since the beginning of the pandemic.  On Metamucil and which has helped her constipation significantly.     Current Outpatient Medications  Medication Sig Dispense Refill  . albuterol (PROVENTIL HFA;VENTOLIN HFA) 108 (90 BASE) MCG/ACT inhaler Inhale 2 puffs into the lungs every 6 (six) hours as needed for wheezing or shortness of breath. 1 Inhaler 2  . benzonatate (TESSALON) 100 MG capsule Take 1 capsule (100 mg total) by mouth 3 (three) times daily as needed for cough. 21 capsule 0  . citalopram (CELEXA) 20 MG  tablet Take 1 tablet (20 mg total) by mouth daily. 30 tablet 5  . cyclobenzaprine (FLEXERIL) 10 MG tablet Take 1 tablet by mouth at bedtime as needed.    Marland Kitchen Dexlansoprazole (DEXILANT) 30 MG capsule Take 1 capsule (30 mg total) by mouth daily. 90 capsule 1  . folic acid (FOLVITE) 1 MG tablet     . hydrochlorothiazide (HYDRODIURIL) 12.5 MG tablet Take 1 tablet (12.5 mg total) by mouth daily. 90 tablet 1  . levocetirizine (XYZAL) 5 MG tablet Take 5 mg by mouth at bedtime.    Marland Kitchen losartan (COZAAR) 50 MG tablet Take 1 tablet (50 mg total) by mouth daily. 90 tablet 1  . medroxyPROGESTERone (DEPO-PROVERA) 150 MG/ML injection Inject 150 mg into the muscle every 3 (three) months.     . methotrexate (RHEUMATREX) 2.5 MG tablet Take 22.5 mg by mouth every Monday. Caution:Chemotherapy. Protect from light. Take 9 tabs po weekly with meals    . montelukast (SINGULAIR) 10 MG tablet take 1 tablet by mouth at bedtime 30 tablet 3  . Tocilizumab (ACTEMRA) 162 MG/0.9ML SOSY Inject into the skin.    . valACYclovir (VALTREX) 500 MG tablet Take one tablet bid x 3 days.  May repeat for flares as directed. 30 tablet 0   Current Facility-Administered Medications  Medication Dose Route Frequency Provider Last Rate Last Admin  . betamethasone acetate-betamethasone sodium phosphate (CELESTONE) injection 12 mg  12 mg Other Once Magnus Sinning, MD        Allergies as of 08/22/2019 -  Review Complete 08/08/2019  Allergen Reaction Noted  . Inh [isoniazid] Other (See Comments) 07/30/2012    Review of Systems:    All systems reviewed and negative except where noted in HPI.  General Appearance:    Alert, cooperative, no distress, appears stated age  Head:    Normocephalic, without obvious abnormality, atraumatic  Eyes:    PERRL, conjunctiva/corneas clear,  Ears:    Grossly normal hearing    Neurologic:  Grossly normal   .   Observations/Objective:  Labs: CMP     Component Value Date/Time   NA 139 04/25/2019 1010    K 4.1 04/25/2019 1010   CL 104 04/25/2019 1010   CO2 27 04/25/2019 1010   GLUCOSE 107 (H) 04/25/2019 1010   BUN 19 04/25/2019 1010   BUN 8 08/14/2011 0000   CREATININE 0.89 04/25/2019 1010   CALCIUM 9.7 04/25/2019 1010   PROT 7.1 05/30/2019 1449   ALBUMIN 4.1 05/30/2019 1449   AST 18 05/30/2019 1449   ALT 17 05/30/2019 1449   ALKPHOS 83 05/30/2019 1449   BILITOT 0.7 05/30/2019 1449   GFRNONAA >60 06/22/2017 0816   GFRAA >60 06/22/2017 0816   Lab Results  Component Value Date   WBC 8.0 05/30/2019   HGB 12.9 05/30/2019   HCT 39.5 05/30/2019   MCV 94.5 05/30/2019   PLT 334.0 05/30/2019    Imaging Studies: No results found.  Assessment and Plan:   Tina Parker is a 49 y.o. y/o female here to follow up for dysphagia and  bloating is possibly due to constipation.  Plan 1.    GERD: Continue Dexilant, counseled again on lifestyle changes provided patient information.  We will plan to decrease Dexilant dosage from 60 mg a day to 30 mg a day when she runs out of the present prescription. 2.  Hiatal hernia: Discussed briefly about surgical options and suggested to lose some weight and based on that in 6 months we will discuss further and consider referral for hiatal hernia repair. 3.  Constipation: Doing well on Metamucil and increase dietary fiber.  Follow-up in 6 months.   Dr Jonathon Bellows MD,MRCP [facility]) Gastroenterology/Hepatology Pager: [phone]   Speech recognition software was used to dictate this note.

## 2019-08-23 ENCOUNTER — Telehealth: Payer: Self-pay | Admitting: Internal Medicine

## 2019-08-23 NOTE — Telephone Encounter (Signed)
Pt needs a refill on losartan (COZAAR) 50 MG tablet sent to [facility]

## 2019-08-24 MED ORDER — LOSARTAN POTASSIUM 50 MG PO TABS: 50.0000 mg | ORAL_TABLET | Freq: Every day | ORAL | 1 refills | Status: DC

## 2019-08-24 NOTE — Telephone Encounter (Signed)
rx sent in to [facility] for losartan #90 with one refill.

## 2019-08-24 NOTE — Addendum Note (Signed)
Addended by: Alisa Graff on: 08/24/2019 05:48 AM   Modules accepted: Orders

## 2019-08-30 ENCOUNTER — Emergency Department
Admission: EM | Admit: 2019-08-30 | Discharge: 2019-08-30 | Disposition: A | Discharge status: Home / Self Care | Payer: 59 | Attending: Emergency Medicine | Admitting: Emergency Medicine

## 2019-08-30 ENCOUNTER — Emergency Department: Payer: 59

## 2019-08-30 ENCOUNTER — Other Ambulatory Visit: Payer: Self-pay

## 2019-08-30 ENCOUNTER — Telehealth: Payer: Self-pay | Admitting: Internal Medicine

## 2019-08-30 ENCOUNTER — Encounter: Payer: Self-pay | Admitting: Emergency Medicine

## 2019-08-30 DIAGNOSIS — I10 Essential (primary) hypertension: Secondary | ICD-10-CM | POA: Diagnosis not present

## 2019-08-30 DIAGNOSIS — Z79899 Other long term (current) drug therapy: Secondary | ICD-10-CM | POA: Insufficient documentation

## 2019-08-30 DIAGNOSIS — M79605 Pain in left leg: Secondary | ICD-10-CM | POA: Insufficient documentation

## 2019-08-30 DIAGNOSIS — E039 Hypothyroidism, unspecified: Secondary | ICD-10-CM | POA: Insufficient documentation

## 2019-08-30 HISTORY — DX: Sciatica, unspecified side: M54.30

## 2019-08-30 NOTE — ED Notes (Signed)
U/s at bedside

## 2019-08-30 NOTE — ED Triage Notes (Signed)
Pt arrived via POV with reports of left lower leg pain, pt c/o pain x 1 months, pt has what appears to be varicose veins present, pt also c/o swelling in leg. Pt states the swelling decreases at night but increases during the day.  Pt ambulatory without difficulty in triage.

## 2019-08-30 NOTE — ED Provider Notes (Signed)
[facility] Emergency Department Provider Note  ____________________________________________  Time seen: Approximately 8:17 PM  I have reviewed the triage vital signs and the nursing notes.   HISTORY  Chief Complaint Leg Pain    HPI Tina Parker is a 49 y.o. female that presents to the emergency department for evaluation of left lower leg pain for 2 weeks and swelling for 1 month.  Patient states that pain is to the outside of her left shin.  She does have some spider veins around this location.  She does not have any pain to the inside of her leg.  Patient did not bring it to the attention of anyone because she thought it was pain radiating from her back.  She does have chronic back pain.  She saw her orthopedist today and was told that he did not think this was from her back.  She then called her primary care, who was closed and recommended that she go to urgent care to be evaluated.  Urgent care referred her to the emergency department for an ultrasound to rule out a DVT.  Patient denies any ankle or foot swelling.  No foot numbness, tingling.  No bowel or bladder dysfunction or saddle anesthesias.  Patient does not exercise regularly.  Past Medical History:  Diagnosis Date  . Allergic rhinitis   . Anemia   . Arthritis    RA  . Carpal tunnel syndrome   . Dysphagia   . Esophageal spasm 01/16/2014  . GERD (gastroesophageal reflux disease)   . Graves disease    remission, no ablation, positive medical treatment  . History of hiatal hernia   . Hypertension   . Migraine headache    migraines  . PPD positive    hepatitis secondary to [place]  . Pure hypercholesterolemia   . Rheumatoid arthritis(714.0)    positive anti CCP antibodies, positive RF, oligo-articular, MTX  . Sciatica     Patient Active Problem List   Diagnosis Date Noted  . Mild sleep apnea 08/08/2019  . Thyroid nodule 05/18/2019  . Thyroid fullness 05/01/2019  . Anxiety 06/08/2018  .  Sinusitis 06/08/2018  . History of meningioma of the brain 12/21/2017  . Herpes 07/23/2017  . 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 ([place]) 06/25/2017  . Pain of both hip joints 05/18/2017  . Vitamin D deficiency 09/21/2016  . Cough 01/07/2015  . Difficulty sleeping 01/07/2015  . Health care maintenance 08/02/2014  . Snoring 03/19/2014  . Dysphagia 01/15/2014  . BMI 32.0-32.9,adult 12/11/2013  . Skin lesion 12/11/2013  . Family history of breast cancer 10/22/2013  . Environmental allergies 10/22/2013  . Essential hypertension, benign 09/15/2012  . Hypercholesterolemia 09/15/2012  . Rheumatoid arthritis ([place]) 09/15/2012  . GERD (gastroesophageal reflux disease) 09/15/2012  . Family history of colon cancer 09/15/2012  . Anemia 09/15/2012  . Hypothyroidism 09/15/2012    Past Surgical History:  Procedure Laterality Date  . CARPAL TUNNEL RELEASE Right   . CESAREAN SECTION  1996  . COLONOSCOPY  03/14/2002, 08/16/2007, 03/07/2013   [facility]-father  . COLONOSCOPY WITH PROPOFOL N/A 06/21/2018   Procedure: COLONOSCOPY WITH PROPOFOL;  Surgeon: Manya Silvas, MD;  Location[facility] ENDOSCOPY;  Service: Endoscopy;  Laterality: N/A;  . CRANIOTOMY Left 06/25/2017   Procedure: CRANIOTOMY TEMPORAL LEFT FOR TUMOR RESECTION;  Surgeon: Ashok Pall, MD;  Location: [place];  Service: Neurosurgery;  Laterality: Left;  . ESOPHAGOGASTRODUODENOSCOPY  03/14/2002, 03/07/2013  . ESOPHAGOGASTRODUODENOSCOPY (EGD) WITH PROPOFOL N/A 06/21/2018   Procedure: ESOPHAGOGASTRODUODENOSCOPY (  EGD) WITH PROPOFOL;  Surgeon: Manya Silvas, MD;  Location[facility] ENDOSCOPY;  Service: Endoscopy;  Laterality: N/A;  . ESOPHAGOGASTRODUODENOSCOPY (EGD) WITH PROPOFOL N/A 07/15/2019   Procedure: ESOPHAGOGASTRODUODENOSCOPY (EGD) WITH PROPOFOL;  Surgeon: Jonathon Bellows, MD;  Location: [facility] ENDOSCOPY;  Service: Gastroenterology;  Laterality: N/A;  . Post Septoplasty and turbinate reduction  2007  . TRIGGER FINGER RELEASE       Prior to Admission medications   Medication Sig Start Date End Date Taking? Authorizing Provider  albuterol (PROVENTIL HFA;VENTOLIN HFA) 108 (90 BASE) MCG/ACT inhaler Inhale 2 puffs into the lungs every 6 (six) hours as needed for wheezing or shortness of breath. 01/05/15   Einar Pheasant, MD  benzonatate (TESSALON) 100 MG capsule Take 1 capsule (100 mg total) by mouth 3 (three) times daily as needed for cough. 04/15/19   Einar Pheasant, MD  citalopram (CELEXA) 20 MG tablet Take 1 tablet (20 mg total) by mouth daily. 04/25/19   Einar Pheasant, MD  cyclobenzaprine (FLEXERIL) 10 MG tablet Take 1 tablet by mouth at bedtime as needed. 03/01/19   [provider]  Dexlansoprazole (DEXILANT) 30 MG capsule Take 1 capsule (30 mg total) by mouth daily. 07/22/19   Jonathon Bellows, MD  folic acid (FOLVITE) 1 MG tablet  03/10/19   [provider]  hydrochlorothiazide (HYDRODIURIL) 12.5 MG tablet Take 1 tablet (12.5 mg total) by mouth daily. 02/08/19   Einar Pheasant, MD  levocetirizine (XYZAL) 5 MG tablet Take 5 mg by mouth at bedtime.    [provider]  losartan (COZAAR) 50 MG tablet Take 1 tablet (50 mg total) by mouth daily. 08/24/19   Einar Pheasant, MD  medroxyPROGESTERone (DEPO-PROVERA) 150 MG/ML injection Inject 150 mg into the muscle every 3 (three) months.     [provider]  methotrexate (RHEUMATREX) 2.5 MG tablet Take 22.5 mg by mouth every Monday. Caution:Chemotherapy. Protect from light. Take 9 tabs po weekly with meals    [provider]  montelukast (SINGULAIR) 10 MG tablet take 1 tablet by mouth at bedtime 10/08/17   Einar Pheasant, MD  Tocilizumab (ACTEMRA) 162 MG/0.9ML SOSY Inject into the skin. 05/03/19   [provider]  valACYclovir (VALTREX) 500 MG tablet Take one tablet bid x 3 days.  May repeat for flares as directed. 05/10/19   Einar Pheasant, MD    Allergies Inh [isoniazid]  Family History  Problem Relation Age of Onset  .  Cancer Mother        Breast Cancer  . Breast cancer Mother 92  . Cancer Father        Colon Cancer  . Heart disease Father        Hx of MI  . Hypertension Father   . Diabetes Father   . Cancer Maternal Grandmother        lung cancer  . Cancer Maternal Uncle        esophageal    Social History Social History   Tobacco Use  . Smoking status: Never Smoker  . Smokeless tobacco: Never Used  Substance Use Topics  . Alcohol use: Yes    Alcohol/week: 0.0 standard drinks    Comment: occasionally  . Drug use: No     Review of Systems  Constitutional: No fever/chills Respiratory:  No SOB. Gastrointestinal: No abdominal pain.  No nausea, no vomiting.  Musculoskeletal: Positive for lower leg pain. Skin: Negative for rash, abrasions, lacerations, ecchymosis. Neurological: Negative for headaches, numbness or tingling   ____________________________________________   PHYSICAL EXAM:  VITAL SIGNS: ED Triage Vitals  Enc Vitals Group     BP 08/30/19 1911 (!) 159/95     Pulse Rate 08/30/19 1911 80     Resp 08/30/19 1911 18     Temp 08/30/19 1911 98.9 F (37.2 C)     Temp Source 08/30/19 1911 Oral     SpO2 08/30/19 1911 98 %     Weight 08/30/19 1908 180 lb (81.6 kg)     Height 08/30/19 1908 5\' 1"  (1.549 m)     Head Circumference --      Peak Flow --      Pain Score 08/30/19 1908 6     Pain Loc --      Pain Edu? --      Excl. in [place]? --      Constitutional: Alert and oriented. Well appearing and in no acute distress. Eyes: Conjunctivae are normal. PERRL. EOMI. Head: Atraumatic. ENT:      Ears:      Nose: No congestion/rhinnorhea.      Mouth/Throat: Mucous membranes are moist.  Neck: No stridor.  Cardiovascular: Normal rate, regular rhythm.  Good peripheral circulation.  Symmetric pedal pulses bilaterally.  Cap refill less than 3 seconds. Respiratory: Normal respiratory effort without tachypnea or retractions. Lungs CTAB. Good air entry to the bases with no decreased or  absent breath sounds. Gastrointestinal: Bowel sounds 4 quadrants. Soft and nontender to palpation. No guarding or rigidity. No palpable masses. No distention.  Musculoskeletal: Full range of motion to all extremities. No gross deformities appreciated.  Mild tenderness to palpation to left lateral calf/shin.  No tenderness to palpation to medial calf/shin.  No ankle or foot swelling.  Full range of motion of toes.  No foot drop. Foot is warm to touch.  Normal gait. Neurologic:  Normal speech and language. No gross focal neurologic deficits are appreciated.  Skin:  Skin is warm, dry and intact. No rash noted. Psychiatric: Mood and affect are normal. Speech and behavior are normal. Patient exhibits appropriate insight and judgement.   ____________________________________________   LABS (all labs ordered are listed, but only abnormal results are displayed)  Labs Reviewed - No data to display ____________________________________________  EKG   ____________________________________________  RADIOLOGY Robinette Haines, personally viewed and evaluated these images (plain radiographs) as part of my medical decision making, as well as reviewing the written report by the radiologist.  DG Tibia/Fibula Left  Result Date: 08/30/2019 CLINICAL DATA:  Leg pain and swelling lower leg. EXAM: LEFT TIBIA AND FIBULA - 2 VIEW COMPARISON:  None. FINDINGS: There are edematous soft tissue changes most pronounced over the lateral leg. No acute bony abnormality. Specifically, no fracture, subluxation, or dislocation. Alignment at the knee and ankle is grossly preserved on these nondedicated radiographs. Bidirectional calcaneal spurs are noted incidentally. A small patellar enthesophyte is noted superiorly as well. IMPRESSION: Edematous soft tissue changes most pronounced over the lateral leg. No acute bony abnormality. Electronically Signed   By: Lovena Le M.D.   On: 08/30/2019 20:31   US Venous Img Lower  Unilateral Left  Result Date: 08/30/2019 CLINICAL DATA:  Leg swelling EXAM: LEFT LOWER EXTREMITY VENOUS DOPPLER ULTRASOUND TECHNIQUE: Gray-scale sonography with compression, as well as color and duplex ultrasound, were performed to evaluate the deep venous system(s) from the level of the common femoral vein through the popliteal and proximal calf veins. COMPARISON:  None. FINDINGS: VENOUS Normal compressibility of the common femoral, superficial femoral, and popliteal veins, as well as the visualized  calf veins. Visualized portions of profunda femoral vein and great saphenous vein unremarkable. No filling defects to suggest DVT on grayscale or color Doppler imaging. Doppler waveforms show normal direction of venous flow, normal respiratory phasicity and response to augmentation. Limited views of the contralateral common femoral vein are unremarkable. OTHER None. Limitations: none IMPRESSION: No femoropopliteal DVT nor evidence of DVT within the visualized calf veins. If clinical symptoms are inconsistent or if there are persistent or worsening symptoms, further imaging (possibly involving the iliac veins) may be warranted. Electronically Signed   By: Lovena Le M.D.   On: 08/30/2019 19:52    ____________________________________________    PROCEDURES  Procedure(s) performed:    Procedures    Medications - No data to display   ____________________________________________   INITIAL IMPRESSION / ASSESSMENT AND PLAN / ED COURSE  Pertinent labs & imaging results that were available during my care of the patient were reviewed by me and considered in my medical decision making (see chart for details).  Review of the [place] CSRS was performed in accordance of the [place] prior to dispensing any controlled drugs.   Patient presented to emergency department for evaluation of left lower leg pain.  Vital signs and exam are reassuring.  Ultrasound negative for DVT.  X-ray negative for acute bony  abnormality.   Patient is to follow up with primary care as directed.  I suspect that her pain and symptoms are related to her spider veins.  She will discuss with primary care for a follow-up with vascular surgery to discuss her spider veins.  Patient is given ED precautions to return to the ED for any worsening or new symptoms.  [place] was evaluated in Emergency Department on 08/30/2019 for the symptoms described in the history of present illness. She was evaluated in the context of the global COVID-19 pandemic, which necessitated consideration that the patient might be at risk for infection with the SARS-CoV-2 virus that causes COVID-19. Institutional protocols and algorithms that pertain to the evaluation of patients at risk for COVID-19 are in a state of rapid change based on information released by regulatory bodies including the CDC and federal and state organizations. These policies and algorithms were followed during the patient's care in the ED.   ____________________________________________  FINAL CLINICAL IMPRESSION(S) / ED DIAGNOSES  Final diagnoses:  Pain of left lower extremity      NEW MEDICATIONS STARTED DURING THIS VISIT:  ED Discharge Orders    None          This chart was dictated using voice recognition software/Dragon. Despite best efforts to proofread, errors can occur which can change the meaning. Any change was purely unintentional.    Laban Emperor, PA-C 08/30/19 2253    Arta Silence, MD 08/31/19 [phone]

## 2019-08-30 NOTE — Telephone Encounter (Signed)
Called patient. She is having swelling in left leg. No warmth or redness. Some pain. Swelling is better with elevation. She had a televisit with another provider today- advised to contact pcp. Advised since our office was closed she should go to urgent care to be evaluated to rule out possible DVT, etc. We will f/u with patient on 3/15. Pt agreed to go be evaluated tonight.

## 2019-08-30 NOTE — Telephone Encounter (Signed)
Pt called in and at first she was saying she was having trouble with her left leg and just wanted an appt. After I scheduled appt with Dr. Nicki Reaper for the date patient want she then start to say that her leg was swollen and the veins in her leg hurt. I transferred her to Access Nurse and Geoffery Spruce took the call.

## 2019-08-30 NOTE — ED Notes (Addendum)
See triage note- Pt reports pain and swelling in left lower leg. Swelling has been present for the last two weeks and pain started a month ago. Pain is off and on. Swelling decreases during the night. Pain is worst when sitting.

## 2019-08-30 NOTE — ED Triage Notes (Signed)
First RN Note: Pt presents to ED via POV with c/o L calf pain and swelling around "areas of broken veins". Pt states sent by [facility] for [place] to rule out blood clot. Pt ambulatory without difficulty at this time.

## 2019-08-31 ENCOUNTER — Encounter: Payer: Self-pay | Admitting: Internal Medicine

## 2019-09-04 ENCOUNTER — Ambulatory Visit: Payer: 59 | Attending: Internal Medicine

## 2019-09-04 DIAGNOSIS — Z23 Encounter for immunization: Secondary | ICD-10-CM | POA: Insufficient documentation

## 2019-09-04 NOTE — Progress Notes (Signed)
   Covid-19 Vaccination Clinic  Name:  Tina Parker    MRN: [id number] DOB: April 26, 1971  09/04/2019  [name] was observed post Covid-19 immunization for 15 minutes without incident. She was provided with Vaccine Information Sheet and instruction to access the V-Safe system.   [name] was instructed to call 911 with any severe reactions post vaccine: Marland Kitchen Difficulty breathing  . Swelling of face and throat  . A fast heartbeat  . A bad rash all over body  . Dizziness and weakness   Immunizations Administered    Name Date Dose VIS Date Route   Pfizer COVID-19 Vaccine 09/04/2019  2:18 PM 0.3 mL 06/10/2019 Intramuscular   Manufacturer: [place]   Lot: [id number]   [place]: [id number]

## 2019-09-12 ENCOUNTER — Ambulatory Visit: Payer: 59 | Admitting: Internal Medicine

## 2019-09-12 ENCOUNTER — Other Ambulatory Visit: Payer: Self-pay

## 2019-09-12 ENCOUNTER — Encounter: Payer: Self-pay | Admitting: Internal Medicine

## 2019-09-12 DIAGNOSIS — M059 Rheumatoid arthritis with rheumatoid factor, unspecified: Secondary | ICD-10-CM

## 2019-09-12 DIAGNOSIS — M79605 Pain in left leg: Secondary | ICD-10-CM | POA: Diagnosis not present

## 2019-09-12 DIAGNOSIS — M79606 Pain in leg, unspecified: Secondary | ICD-10-CM | POA: Insufficient documentation

## 2019-09-12 NOTE — Progress Notes (Signed)
Patient ID: Tina Parker, female   DOB: Jul 08, 1970, 49 y.o.   MRN: [id number]   Subjective:    Patient ID: Tina Parker, female    DOB: December 17, 1970, 49 y.o.   MRN: [id number]  HPI This visit occurred during the SARS-CoV-2 public health emergency.  Safety protocols were in place, including screening questions prior to the visit, additional usage of staff PPE, and extensive cleaning of exam room while observing appropriate contact time as indicated for disinfecting solutions.  Patient here as a work in with concerns regarding left leg pain.  Went to ER 08/30/19 ultrasound revealed no DVT.   Xray negative for acute bony abnormality.  Pain localized - left lower leg - posterior.  Saw ortho.  Did not feel related to her back.  Persistent pain - left middle  - left side of left leg.  No redness.  No increased warmth.  Had prednisone recently for her hand.  Did not help.    Past Medical History:  Diagnosis Date  . Allergic rhinitis   . Anemia   . Arthritis    RA  . Carpal tunnel syndrome   . Dysphagia   . Esophageal spasm 01/16/2014  . GERD (gastroesophageal reflux disease)   . Graves disease    remission, no ablation, positive medical treatment  . History of hiatal hernia   . Hypertension   . Migraine headache    migraines  . PPD positive    hepatitis secondary to [place]  . Pure hypercholesterolemia   . Rheumatoid arthritis(714.0)    positive anti CCP antibodies, positive RF, oligo-articular, MTX  . Sciatica    Past Surgical History:  Procedure Laterality Date  . CARPAL TUNNEL RELEASE Right   . CESAREAN SECTION  1996  . COLONOSCOPY  03/14/2002, 08/16/2007, 03/07/2013   [facility]-father  . COLONOSCOPY WITH PROPOFOL N/A 06/21/2018   Procedure: COLONOSCOPY WITH PROPOFOL;  Surgeon: Manya Silvas, MD;  Location[facility] ENDOSCOPY;  Service: Endoscopy;  Laterality: N/A;  . CRANIOTOMY Left 06/25/2017   Procedure: CRANIOTOMY TEMPORAL LEFT FOR TUMOR RESECTION;  Surgeon: Ashok Pall, MD;   Location: [place];  Service: Neurosurgery;  Laterality: Left;  . ESOPHAGOGASTRODUODENOSCOPY  03/14/2002, 03/07/2013  . ESOPHAGOGASTRODUODENOSCOPY (EGD) WITH PROPOFOL N/A 06/21/2018   Procedure: ESOPHAGOGASTRODUODENOSCOPY (EGD) WITH PROPOFOL;  Surgeon: Manya Silvas, MD;  Location: [facility] ENDOSCOPY;  Service: Endoscopy;  Laterality: N/A;  . ESOPHAGOGASTRODUODENOSCOPY (EGD) WITH PROPOFOL N/A 07/15/2019   Procedure: ESOPHAGOGASTRODUODENOSCOPY (EGD) WITH PROPOFOL;  Surgeon: Jonathon Bellows, MD;  Location: [facility] ENDOSCOPY;  Service: Gastroenterology;  Laterality: N/A;  . Post Septoplasty and turbinate reduction  2007  . TRIGGER FINGER RELEASE     Family History  Problem Relation Age of Onset  . Cancer Mother        Breast Cancer  . Breast cancer Mother 57  . Cancer Father        Colon Cancer  . Heart disease Father        Hx of MI  . Hypertension Father   . Diabetes Father   . Cancer Maternal Grandmother        lung cancer  . Cancer Maternal Uncle        esophageal   Social History   Socioeconomic History  . Marital status: Married    Spouse name: Not on file  . Number of children: Not on file  . Years of education: Not on file  . Highest education level: Not on file  Occupational History  . Not on file  Tobacco Use  . Smoking status: Never Smoker  . Smokeless tobacco: Never Used  Substance and Sexual Activity  . Alcohol use: Yes    Alcohol/week: 0.0 standard drinks    Comment: occasionally  . Drug use: No  . Sexual activity: Not on file  Other Topics Concern  . Not on file  Social History Narrative   Hairdresser    Social Determinants of Health   Financial Resource Strain:   . Difficulty of Paying Living Expenses:   Food Insecurity:   . Worried About Charity fundraiser in the Last Year:   . Arboriculturist in the Last Year:   Transportation Needs:   . Film/video editor (Medical):   Marland Kitchen Lack of Transportation (Non-Medical):   Physical Activity:   . Days of Exercise  per Week:   . Minutes of Exercise per Session:   Stress:   . Feeling of Stress :   Social Connections:   . Frequency of Communication with Friends and Family:   . Frequency of Social Gatherings with Friends and Family:   . Attends Religious Services:   . Active Member of Clubs or Organizations:   . Attends Archivist Meetings:   Marland Kitchen Marital Status:     Outpatient Encounter Medications as of 09/12/2019  Medication Sig  . albuterol (PROVENTIL HFA;VENTOLIN HFA) 108 (90 BASE) MCG/ACT inhaler Inhale 2 puffs into the lungs every 6 (six) hours as needed for wheezing or shortness of breath.  . benzonatate (TESSALON) 100 MG capsule Take 1 capsule (100 mg total) by mouth 3 (three) times daily as needed for cough.  . citalopram (CELEXA) 20 MG tablet Take 1 tablet (20 mg total) by mouth daily.  . cyclobenzaprine (FLEXERIL) 10 MG tablet Take 1 tablet by mouth at bedtime as needed.  Marland Kitchen Dexlansoprazole (DEXILANT) 30 MG capsule Take 1 capsule (30 mg total) by mouth daily.  . folic acid (FOLVITE) 1 MG tablet   . hydrochlorothiazide (HYDRODIURIL) 12.5 MG tablet Take 1 tablet (12.5 mg total) by mouth daily.  Marland Kitchen levocetirizine (XYZAL) 5 MG tablet Take 5 mg by mouth at bedtime.  Marland Kitchen losartan (COZAAR) 50 MG tablet Take 1 tablet (50 mg total) by mouth daily.  . medroxyPROGESTERone (DEPO-PROVERA) 150 MG/ML injection Inject 150 mg into the muscle every 3 (three) months.   . methotrexate (RHEUMATREX) 2.5 MG tablet Take 22.5 mg by mouth every Monday. Caution:Chemotherapy. Protect from light. Take 9 tabs po weekly with meals  . montelukast (SINGULAIR) 10 MG tablet take 1 tablet by mouth at bedtime  . Tocilizumab (ACTEMRA) 162 MG/0.9ML SOSY Inject into the skin.  . valACYclovir (VALTREX) 500 MG tablet Take one tablet bid x 3 days.  May repeat for flares as directed.   Facility-Administered Encounter Medications as of 09/12/2019  Medication  . betamethasone acetate-betamethasone sodium phosphate (CELESTONE)  injection 12 mg    Review of Systems  Constitutional: Negative for appetite change and unexpected weight change.  Respiratory: Negative for cough, chest tightness and shortness of breath.   Cardiovascular: Negative for chest pain, palpitations and leg swelling.  Gastrointestinal: Negative for abdominal pain, diarrhea, nausea and vomiting.  Musculoskeletal: Negative for joint swelling and myalgias.       Left leg pain as outlined.    Skin: Negative for color change and rash.  Neurological: Negative for dizziness, light-headedness and headaches.  Psychiatric/Behavioral: Negative for agitation and dysphoric mood.       Objective:    Physical Exam Constitutional:  General: She is not in acute distress.    Appearance: Normal appearance.  HENT:     Head: Normocephalic and atraumatic.  Neck:     Thyroid: No thyromegaly.  Cardiovascular:     Rate and Rhythm: Normal rate and regular rhythm.  Pulmonary:     Effort: No respiratory distress.     Breath sounds: Normal breath sounds. No wheezing.  Abdominal:     General: Bowel sounds are normal.     Palpations: Abdomen is soft.     Tenderness: There is no abdominal tenderness.  Musculoskeletal:        General: No swelling or tenderness.     Cervical back: Neck supple. No tenderness.     Comments: Increased tenderness to palpation - left leg.(tender to palpation over veins).   No increased erythema.  No swelling.    Lymphadenopathy:     Cervical: No cervical adenopathy.  Skin:    Findings: No erythema or rash.  Neurological:     Mental Status: She is alert.  Psychiatric:        Mood and Affect: Mood normal.        Behavior: Behavior normal.     BP 122/78   Pulse 88   Temp (!) 97.1 F (36.2 C)   Resp 16   Ht 5\' 1"  (1.549 m)   Wt 185 lb 3.2 oz (84 kg)   SpO2 99%   BMI 34.99 kg/m  Wt Readings from Last 3 Encounters:  09/12/19 185 lb 3.2 oz (84 kg)  08/30/19 180 lb (81.6 kg)  07/15/19 179 lb (81.2 kg)     Lab  Results  Component Value Date   WBC 8.0 05/30/2019   HGB 12.9 05/30/2019   HCT 39.5 05/30/2019   PLT 334.0 05/30/2019   GLUCOSE 107 (H) 04/25/2019   CHOL 210 (H) 04/25/2019   TRIG 52.0 04/25/2019   HDL 62.90 04/25/2019   LDLCALC 137 (H) 04/25/2019   ALT 17 05/30/2019   AST 18 05/30/2019   NA 139 04/25/2019   K 4.1 04/25/2019   CL 104 04/25/2019   CREATININE 0.89 04/25/2019   BUN 19 04/25/2019   CO2 27 04/25/2019   TSH 1.97 04/25/2019    DG Tibia/Fibula Left  Result Date: 08/30/2019 CLINICAL DATA:  Leg pain and swelling lower leg. EXAM: LEFT TIBIA AND FIBULA - 2 VIEW COMPARISON:  None. FINDINGS: There are edematous soft tissue changes most pronounced over the lateral leg. No acute bony abnormality. Specifically, no fracture, subluxation, or dislocation. Alignment at the knee and ankle is grossly preserved on these nondedicated radiographs. Bidirectional calcaneal spurs are noted incidentally. A small patellar enthesophyte is noted superiorly as well. IMPRESSION: Edematous soft tissue changes most pronounced over the lateral leg. No acute bony abnormality. Electronically Signed   By: Lovena Le M.D.   On: 08/30/2019 20:31   US Venous Img Lower Unilateral Left  Result Date: 08/30/2019 CLINICAL DATA:  Leg swelling EXAM: LEFT LOWER EXTREMITY VENOUS DOPPLER ULTRASOUND TECHNIQUE: Gray-scale sonography with compression, as well as color and duplex ultrasound, were performed to evaluate the deep venous system(s) from the level of the common femoral vein through the popliteal and proximal calf veins. COMPARISON:  None. FINDINGS: VENOUS Normal compressibility of the common femoral, superficial femoral, and popliteal veins, as well as the visualized calf veins. Visualized portions of profunda femoral vein and great saphenous vein unremarkable. No filling defects to suggest DVT on grayscale or color Doppler imaging. Doppler waveforms show normal direction of  venous flow, normal respiratory phasicity  and response to augmentation. Limited views of the contralateral common femoral vein are unremarkable. OTHER None. Limitations: none IMPRESSION: No femoropopliteal DVT nor evidence of DVT within the visualized calf veins. If clinical symptoms are inconsistent or if there are persistent or worsening symptoms, further imaging (possibly involving the iliac veins) may be warranted. Electronically Signed   By: Lovena Le M.D.   On: 08/30/2019 19:52       Assessment & Plan:   Problem List Items Addressed This Visit    Leg pain, left    Left leg pain - recent ultrasound and xray - unrevealing.  Increased pain to palpation.  Appears to be of more vascular origin - varicosities/spider veins.  DP pulses palpable and equal bilaterally.  Discussed compression hose.  Discussed referral to vascular surgery for further evaluation.        Relevant Orders   Ambulatory referral to Vascular Surgery   Rheumatoid arthritis ([place])    On actemra.  Stable.  Just had course of prednisone.  Did not help leg pain.  Follow.           Einar Pheasant, MD

## 2019-09-18 ENCOUNTER — Encounter: Payer: Self-pay | Admitting: Internal Medicine

## 2019-09-18 NOTE — Assessment & Plan Note (Signed)
Left leg pain - recent ultrasound and xray - unrevealing.  Increased pain to palpation.  Appears to be of more vascular origin - varicosities/spider veins.  DP pulses palpable and equal bilaterally.  Discussed compression hose.  Discussed referral to vascular surgery for further evaluation.

## 2019-09-18 NOTE — Assessment & Plan Note (Signed)
On actemra.  Stable.  Just had course of prednisone.  Did not help leg pain.  Follow.

## 2019-09-26 ENCOUNTER — Ambulatory Visit: Payer: 59 | Admitting: Internal Medicine

## 2019-09-26 ENCOUNTER — Other Ambulatory Visit: Payer: Self-pay

## 2019-09-26 ENCOUNTER — Encounter: Payer: Self-pay | Admitting: Internal Medicine

## 2019-09-26 ENCOUNTER — Encounter (INDEPENDENT_AMBULATORY_CARE_PROVIDER_SITE_OTHER): Payer: Self-pay | Admitting: Vascular Surgery

## 2019-09-26 ENCOUNTER — Ambulatory Visit (INDEPENDENT_AMBULATORY_CARE_PROVIDER_SITE_OTHER): Payer: 59 | Admitting: Vascular Surgery

## 2019-09-26 ENCOUNTER — Encounter (INDEPENDENT_AMBULATORY_CARE_PROVIDER_SITE_OTHER): Payer: Self-pay

## 2019-09-26 VITALS — BP 142/90 | HR 90 | Resp 16 | Ht 61.0 in | Wt 185.8 lb

## 2019-09-26 VITALS — BP 140/78 | HR 78 | Temp 97.2°F | Resp 18 | Ht 61.0 in | Wt 185.0 lb

## 2019-09-26 DIAGNOSIS — I872 Venous insufficiency (chronic) (peripheral): Secondary | ICD-10-CM | POA: Diagnosis not present

## 2019-09-26 DIAGNOSIS — M79605 Pain in left leg: Secondary | ICD-10-CM

## 2019-09-26 DIAGNOSIS — E7800 Pure hypercholesterolemia, unspecified: Secondary | ICD-10-CM

## 2019-09-26 DIAGNOSIS — E039 Hypothyroidism, unspecified: Secondary | ICD-10-CM

## 2019-09-26 DIAGNOSIS — G4730 Sleep apnea, unspecified: Secondary | ICD-10-CM

## 2019-09-26 DIAGNOSIS — G5600 Carpal tunnel syndrome, unspecified upper limb: Secondary | ICD-10-CM | POA: Insufficient documentation

## 2019-09-26 DIAGNOSIS — R7611 Nonspecific reaction to tuberculin skin test without active tuberculosis: Secondary | ICD-10-CM | POA: Insufficient documentation

## 2019-09-26 DIAGNOSIS — I10 Essential (primary) hypertension: Secondary | ICD-10-CM | POA: Diagnosis not present

## 2019-09-26 DIAGNOSIS — E559 Vitamin D deficiency, unspecified: Secondary | ICD-10-CM

## 2019-09-26 DIAGNOSIS — M79604 Pain in right leg: Secondary | ICD-10-CM | POA: Diagnosis not present

## 2019-09-26 DIAGNOSIS — Z0000 Encounter for general adult medical examination without abnormal findings: Secondary | ICD-10-CM | POA: Diagnosis not present

## 2019-09-26 DIAGNOSIS — E538 Deficiency of other specified B group vitamins: Secondary | ICD-10-CM

## 2019-09-26 DIAGNOSIS — L97411 Non-pressure chronic ulcer of right heel and midfoot limited to breakdown of skin: Secondary | ICD-10-CM | POA: Insufficient documentation

## 2019-09-26 DIAGNOSIS — E041 Nontoxic single thyroid nodule: Secondary | ICD-10-CM

## 2019-09-26 DIAGNOSIS — I7025 Atherosclerosis of native arteries of other extremities with ulceration: Secondary | ICD-10-CM | POA: Insufficient documentation

## 2019-09-26 DIAGNOSIS — K449 Diaphragmatic hernia without obstruction or gangrene: Secondary | ICD-10-CM | POA: Insufficient documentation

## 2019-09-26 DIAGNOSIS — M059 Rheumatoid arthritis with rheumatoid factor, unspecified: Secondary | ICD-10-CM

## 2019-09-26 DIAGNOSIS — J309 Allergic rhinitis, unspecified: Secondary | ICD-10-CM

## 2019-09-26 DIAGNOSIS — F419 Anxiety disorder, unspecified: Secondary | ICD-10-CM

## 2019-09-26 DIAGNOSIS — D649 Anemia, unspecified: Secondary | ICD-10-CM

## 2019-09-26 DIAGNOSIS — Z8669 Personal history of other diseases of the nervous system and sense organs: Secondary | ICD-10-CM | POA: Insufficient documentation

## 2019-09-26 LAB — COMPREHENSIVE METABOLIC PANEL
ALT: 23 U/L (ref 0–35)
AST: 23 U/L (ref 0–37)
Albumin: 4.3 g/dL (ref 3.5–5.2)
Alkaline Phosphatase: 74 U/L (ref 39–117)
BUN: 13 mg/dL (ref 6–23)
CO2: 26 mEq/L (ref 19–32)
Calcium: 9.6 mg/dL (ref 8.4–10.5)
Chloride: 105 mEq/L (ref 96–112)
Creatinine, Ser: 0.99 mg/dL (ref 0.40–1.20)
GFR: 71.69 mL/min (ref >60.00)
Glucose, Bld: 91 mg/dL (ref 70–99)
Potassium: 4.1 mEq/L (ref 3.5–5.1)
Sodium: 138 mEq/L (ref 135–145)
Total Bilirubin: 1 mg/dL (ref 0.2–1.2)
Total Protein: 7.2 g/dL (ref 6.0–8.3)

## 2019-09-26 LAB — LIPID PANEL
Cholesterol: 246 mg/dL — ABNORMAL HIGH (ref 0–200)
HDL: 56.2 mg/dL (ref >39.00)
LDL Cholesterol: 168 mg/dL — ABNORMAL HIGH (ref 0–99)
NonHDL: 189.66
Total CHOL/HDL Ratio: 4
Triglycerides: 110 mg/dL (ref 0.0–149.0)
VLDL: 22 mg/dL (ref 0.0–40.0)

## 2019-09-26 LAB — CBC WITH DIFFERENTIAL/PLATELET
Basophils Absolute: 0 10*3/uL (ref 0.0–0.1)
Basophils Relative: 0.5 % (ref 0.0–3.0)
Eosinophils Absolute: 0.1 10*3/uL (ref 0.0–0.7)
Eosinophils Relative: 1 % (ref 0.0–5.0)
HCT: 39.1 % (ref 36.0–46.0)
Hemoglobin: 13.1 g/dL (ref 12.0–15.0)
Lymphocytes Relative: 22.3 % (ref 12.0–46.0)
Lymphs Abs: 1.3 10*3/uL (ref 0.7–4.0)
MCHC: 33.6 g/dL (ref 30.0–36.0)
MCV: 95.6 fl (ref 78.0–100.0)
Monocytes Absolute: 0.6 10*3/uL (ref 0.1–1.0)
Monocytes Relative: 10 % (ref 3.0–12.0)
Neutro Abs: 3.9 10*3/uL (ref 1.4–7.7)
Neutrophils Relative %: 66.2 % (ref 43.0–77.0)
Platelets: 259 10*3/uL (ref 150.0–400.0)
RBC: 4.09 Mil/uL (ref 3.87–5.11)
RDW: 14.2 % (ref 11.5–15.5)
WBC: 5.9 10*3/uL (ref 4.0–10.5)

## 2019-09-26 LAB — VITAMIN B12: Vitamin B-12: 457 pg/mL (ref 211–911)

## 2019-09-26 LAB — FERRITIN: Ferritin: 46.9 ng/mL (ref 10.0–291.0)

## 2019-09-26 MED ORDER — LOSARTAN POTASSIUM 100 MG PO TABS: 100.0000 mg | ORAL_TABLET | Freq: Every day | ORAL | 2 refills | Status: DC

## 2019-09-26 MED ORDER — HYDROCHLOROTHIAZIDE 12.5 MG PO TABS: 12.5000 mg | ORAL_TABLET | Freq: Every day | ORAL | 1 refills | Status: DC

## 2019-09-26 NOTE — Progress Notes (Addendum)
MRN : [id number]  Tina Parker is a 49 y.o. ([birth year]) female who presents with chief complaint of  Chief Complaint  Patient presents with  . New Patient (Initial Visit)    ref Scott left leg pain  .  History of Present Illness:   Patient is seen for evaluation of leg pain and leg swelling. The patient first noticed the swelling remotely. The swelling is associated with pain and some discoloration. The pain and swelling worsens with prolonged dependency and improves with elevation. The patient notes that in the morning the legs are improved but they worsened throughout the course of the day.   The patient denies claudication symptoms.  The patient denies symptoms consistent with rest pain.  The patient denies a history of DJD and LS spine disease.  The patient has no had any past angiography, interventions or vascular surgery.  Elevation seems to make the leg symptoms better, dependency makes them much worse. There is no history of ulcerations. The patient denies any recent changes in medications.  The patient denies a history of DVT or PE. There is no prior history of phlebitis. There is no history of primary lymphedema.  No history of trauma or groin or pelvic surgery. There is no history of radiation treatment to the groin or pelvis    Current Meds  Medication Sig  . albuterol (PROVENTIL HFA;VENTOLIN HFA) 108 (90 BASE) MCG/ACT inhaler Inhale 2 puffs into the lungs every 6 (six) hours as needed for wheezing or shortness of breath.  . benzonatate (TESSALON) 100 MG capsule Take 1 capsule (100 mg total) by mouth 3 (three) times daily as needed for cough.  . citalopram (CELEXA) 20 MG tablet Take 1 tablet (20 mg total) by mouth daily.  . cyclobenzaprine (FLEXERIL) 10 MG tablet Take 1 tablet by mouth at bedtime as needed.  Marland Kitchen Dexlansoprazole (DEXILANT) 30 MG capsule Take 1 capsule (30 mg total) by mouth daily.  . folic acid (FOLVITE) 1 MG tablet   . hydrochlorothiazide  (HYDRODIURIL) 12.5 MG tablet Take 1 tablet (12.5 mg total) by mouth daily.  Marland Kitchen levocetirizine (XYZAL) 5 MG tablet Take 5 mg by mouth at bedtime.  Marland Kitchen losartan (COZAAR) 100 MG tablet Take 1 tablet (100 mg total) by mouth daily.  . medroxyPROGESTERone (DEPO-PROVERA) 150 MG/ML injection Inject 150 mg into the muscle every 3 (three) months.   . methotrexate (RHEUMATREX) 2.5 MG tablet Take 22.5 mg by mouth every Monday. Caution:Chemotherapy. Protect from light. Take 9 tabs po weekly with meals  . montelukast (SINGULAIR) 10 MG tablet take 1 tablet by mouth at bedtime  . Tocilizumab (ACTEMRA) 162 MG/0.9ML SOSY Inject into the skin.  . valACYclovir (VALTREX) 500 MG tablet Take one tablet bid x 3 days.  May repeat for flares as directed.   Current Facility-Administered Medications for the 09/26/19 encounter (Office Visit) with Delana Meyer, Dolores Lory, MD  Medication  . betamethasone acetate-betamethasone sodium phosphate (CELESTONE) injection 12 mg    Past Medical History:  Diagnosis Date  . Allergic rhinitis   . Anemia   . Arthritis    RA  . Carpal tunnel syndrome   . Dysphagia   . Esophageal spasm 01/16/2014  . GERD (gastroesophageal reflux disease)   . Graves disease    remission, no ablation, positive medical treatment  . History of hiatal hernia   . Hypertension   . Migraine headache    migraines  . PPD positive    hepatitis secondary to [place]  . Pure hypercholesterolemia   .  Rheumatoid arthritis(714.0)    positive anti CCP antibodies, positive RF, oligo-articular, MTX  . Sciatica     Past Surgical History:  Procedure Laterality Date  . CARPAL TUNNEL RELEASE Right   . CESAREAN SECTION  1996  . COLONOSCOPY  03/14/2002, 08/16/2007, 03/07/2013   [facility]-father  . COLONOSCOPY WITH PROPOFOL N/A 06/21/2018   Procedure: COLONOSCOPY WITH PROPOFOL;  Surgeon: Manya Silvas, MD;  Location: [facility] ENDOSCOPY;  Service: Endoscopy;  Laterality: N/A;  . CRANIOTOMY Left 06/25/2017   Procedure: CRANIOTOMY  TEMPORAL LEFT FOR TUMOR RESECTION;  Surgeon: Ashok Pall, MD;  Location: [place];  Service: Neurosurgery;  Laterality: Left;  . ESOPHAGOGASTRODUODENOSCOPY  03/14/2002, 03/07/2013  . ESOPHAGOGASTRODUODENOSCOPY (EGD) WITH PROPOFOL N/A 06/21/2018   Procedure: ESOPHAGOGASTRODUODENOSCOPY (EGD) WITH PROPOFOL;  Surgeon: Manya Silvas, MD;  Location: [facility] ENDOSCOPY;  Service: Endoscopy;  Laterality: N/A;  . ESOPHAGOGASTRODUODENOSCOPY (EGD) WITH PROPOFOL N/A 07/15/2019   Procedure: ESOPHAGOGASTRODUODENOSCOPY (EGD) WITH PROPOFOL;  Surgeon: Jonathon Bellows, MD;  Location: [facility] ENDOSCOPY;  Service: Gastroenterology;  Laterality: N/A;  . Post Septoplasty and turbinate reduction  2007  . TRIGGER FINGER RELEASE      Social History Social History   Tobacco Use  . Smoking status: Never Smoker  . Smokeless tobacco: Never Used  Substance Use Topics  . Alcohol use: Yes    Alcohol/week: 0.0 standard drinks    Comment: occasionally  . Drug use: No    Family History Family History  Problem Relation Age of Onset  . Cancer Mother        Breast Cancer  . Breast cancer Mother 23  . Cancer Father        Colon Cancer  . Heart disease Father        Hx of MI  . Hypertension Father   . Diabetes Father   . Cancer Maternal Grandmother        lung cancer  . Cancer Maternal Uncle        esophageal  No family history of bleeding/clotting disorders, porphyria or autoimmune disease   Allergies  Allergen Reactions  . Inh [Isoniazid] Other (See Comments)    Causes hepatitis     REVIEW OF SYSTEMS (Negative unless checked)  Constitutional: [] Weight loss  [] Fever  [] Chills Cardiac: [] Chest pain   [] Chest pressure   [] Palpitations   [] Shortness of breath when laying flat   [] Shortness of breath with exertion. Vascular:  [] Pain in legs with walking   [] Pain in legs at rest  [] History of DVT   [] Phlebitis   [] Swelling in legs   [] Varicose veins   [] Non-healing ulcers Pulmonary:   [] Uses home oxygen   [] Productive  cough   [] Hemoptysis   [] Wheeze  [] COPD   [] Asthma Neurologic:  [] Dizziness   [] Seizures   [] History of stroke   [] History of TIA  [] Aphasia   [] Vissual changes   [] Weakness or numbness in arm   [] Weakness or numbness in leg Musculoskeletal:   [] Joint swelling   [] Joint pain   [] Low back pain Hematologic:  [] Easy bruising  [] Easy bleeding   [] Hypercoagulable state   [] Anemic Gastrointestinal:  [] Diarrhea   [] Vomiting  [] Gastroesophageal reflux/heartburn   [] Difficulty swallowing. Genitourinary:  [] Chronic kidney disease   [] Difficult urination  [] Frequent urination   [] Blood in urine Skin:  [] Rashes   [] Ulcers  Psychological:  [] History of anxiety   []  History of major depression.  Physical Examination  Vitals:   09/26/19 1444  BP: (!) 142/90  Pulse: 90  Resp: 16  Weight:  185 lb 12.8 oz (84.3 kg)  Height: 5\' 1"  (1.549 m)   Body mass index is 35.11 kg/m. Gen: WD/WN, NAD Head: [place]/AT, No temporalis wasting.  Ear/Nose/Throat: Hearing grossly intact, nares w/o erythema or drainage, poor dentition Eyes: PER, EOMI, sclera nonicteric.  Neck: Supple, no masses.   Pulmonary:  Good air movement, clear to auscultation bilaterally, no use of accessory muscles.  Cardiac: RRR,  Vascular: scattered varicosities present bilaterally.  Mild venous stasis changes to the legs bilaterally.  2+ soft pitting edema Vessel Right Left  Radial Palpable Palpable  PT   palpable  palpable  DP   palpable   palpable  Gastrointestinal: soft, non-distended. No guarding/no peritoneal signs.  Musculoskeletal: M 5/5 throughout.  No deformity or atrophy.  Neurologic: CN 2-12 intact.  Speech is fluent. Motor exam as listed above. Psychiatric: Judgment intact, Mood & affect appropriate for pt's clinical situation.   CBC Lab Results  Component Value Date   WBC 8.0 05/30/2019   HGB 12.9 05/30/2019   HCT 39.5 05/30/2019   MCV 94.5 05/30/2019   PLT 334.0 05/30/2019    BMET    Component Value Date/Time   NA  139 04/25/2019 1010   K 4.1 04/25/2019 1010   CL 104 04/25/2019 1010   CO2 27 04/25/2019 1010   GLUCOSE 107 (H) 04/25/2019 1010   BUN 19 04/25/2019 1010   BUN 8 08/14/2011 0000   CREATININE 0.89 04/25/2019 1010   CALCIUM 9.7 04/25/2019 1010   GFRNONAA >60 06/22/2017 0816   GFRAA >60 06/22/2017 0816   CrCl cannot be calculated (Patient's most recent lab result is older than the maximum 21 days allowed.).  COAG No results found for: INR, PROTIME  Radiology DG Tibia/Fibula Left  Result Date: 08/30/2019 CLINICAL DATA:  Leg pain and swelling lower leg. EXAM: LEFT TIBIA AND FIBULA - 2 VIEW COMPARISON:  None. FINDINGS: There are edematous soft tissue changes most pronounced over the lateral leg. No acute bony abnormality. Specifically, no fracture, subluxation, or dislocation. Alignment at the knee and ankle is grossly preserved on these nondedicated radiographs. Bidirectional calcaneal spurs are noted incidentally. A small patellar enthesophyte is noted superiorly as well. IMPRESSION: Edematous soft tissue changes most pronounced over the lateral leg. No acute bony abnormality. Electronically Signed   By: Lovena Le M.D.   On: 08/30/2019 20:31   US Venous Img Lower Unilateral Left  Result Date: 08/30/2019 CLINICAL DATA:  Leg swelling EXAM: LEFT LOWER EXTREMITY VENOUS DOPPLER ULTRASOUND TECHNIQUE: Gray-scale sonography with compression, as well as color and duplex ultrasound, were performed to evaluate the deep venous system(s) from the level of the common femoral vein through the popliteal and proximal calf veins. COMPARISON:  None. FINDINGS: VENOUS Normal compressibility of the common femoral, superficial femoral, and popliteal veins, as well as the visualized calf veins. Visualized portions of profunda femoral vein and great saphenous vein unremarkable. No filling defects to suggest DVT on grayscale or color Doppler imaging. Doppler waveforms show normal direction of venous flow, normal  respiratory phasicity and response to augmentation. Limited views of the contralateral common femoral vein are unremarkable. OTHER None. Limitations: none IMPRESSION: No femoropopliteal DVT nor evidence of DVT within the visualized calf veins. If clinical symptoms are inconsistent or if there are persistent or worsening symptoms, further imaging (possibly involving the iliac veins) may be warranted. Electronically Signed   By: Lovena Le M.D.   On: 08/30/2019 19:52     Assessment/Plan 1. Chronic venous insufficiency No surgery or intervention  at this point in time.    I have had a long discussion with the patient regarding venous insufficiency and why it  causes symptoms. I have discussed with the patient the chronic skin changes that accompany venous insufficiency and the long term sequela such as infection and ulceration.  Patient will begin wearing graduated compression stockings class 1 (20-30 mmHg) or compression wraps on a daily basis a prescription was given. The patient will put the stockings on first thing in the morning and removing them in the evening. The patient is instructed specifically not to sleep in the stockings.    In addition, behavioral modification including several periods of elevation of the lower extremities during the day will be continued. I have demonstrated that proper elevation is a position with the ankles at heart level.  The patient is instructed to begin routine exercise, especially walking on a daily basis  Patient should undergo duplex ultrasound of the venous system to ensure that DVT or reflux is not present.  Following the review of the ultrasound the patient will follow up in 2-3 months to reassess the degree of swelling and the control that graduated compression stockings or compression wraps  is offering.     2. Pain in both lower extremities No surgery or intervention at this point in time.    I have had a long discussion with the patient regarding  venous insufficiency and why it  causes symptoms. I have discussed with the patient the chronic skin changes that accompany venous insufficiency and the long term sequela such as infection and ulceration.  Patient will begin wearing graduated compression stockings class 1 (20-30 mmHg) or compression wraps on a daily basis a prescription was given. The patient will put the stockings on first thing in the morning and removing them in the evening. The patient is instructed specifically not to sleep in the stockings.    In addition, behavioral modification including several periods of elevation of the lower extremities during the day will be continued. I have demonstrated that proper elevation is a position with the ankles at heart level.  The patient is instructed to begin routine exercise, especially walking on a daily basis  Patient should undergo duplex ultrasound of the venous system to ensure that DVT or reflux is not present.  Following the review of the ultrasound the patient will follow up in 2-3 months to reassess the degree of swelling and the control that graduated compression stockings or compression wraps  is offering.     3. Essential hypertension, benign Continue antihypertensive medications as already ordered, these medications have been reviewed and there are no changes at this time.       Hortencia Pilar, MD  09/26/2019 3:15 PM

## 2019-09-26 NOTE — Patient Instructions (Signed)
Add pepcid 20mg - take 30 minutes before your evening meal °

## 2019-09-26 NOTE — Progress Notes (Signed)
Patient ID: Tina Parker, female   DOB: May 09, 1971, 49 y.o.   MRN: [id number]   Subjective:    Patient ID: Tina Parker, female    DOB: Jul 28, 1970, 49 y.o.   MRN: [id number]  HPI This visit occurred during the SARS-CoV-2 public health emergency.  Safety protocols were in place, including screening questions prior to the visit, additional usage of staff PPE, and extensive cleaning of exam room while observing appropriate contact time as indicated for disinfecting solutions.  Patient here for her physical exam.  She reports she is doing relatively well.  Saw endocrinology 09/20/19 for f/u thyroid nodule.  Stable.  Recommended f/u in 6 months. Allergies appear to be under reasonable control.  Taking allegra and using nasal spray in am and taking xyzal, singulair in pm and using her nasal spray.  Some acid reflux.  On dexilant.  Discussed adding pepcid.  No chest pain. Breathing stable.  No abdominal pain.  Followed by Dr Riccardo Dubin for her arthritis.  Has been taking some increased advil.  Needs to limit amount.  Using cpap.  Bowels stable.  Blood pressure elevated. Planning to see vascular surgery today for her legs.     Past Medical History:  Diagnosis Date  . Allergic rhinitis   . Anemia   . Arthritis    RA  . Carpal tunnel syndrome   . Dysphagia   . Esophageal spasm 01/16/2014  . GERD (gastroesophageal reflux disease)   . Graves disease    remission, no ablation, positive medical treatment  . History of hiatal hernia   . Hypertension   . Migraine headache    migraines  . PPD positive    hepatitis secondary to [place]  . Pure hypercholesterolemia   . Rheumatoid arthritis(714.0)    positive anti CCP antibodies, positive RF, oligo-articular, MTX  . Sciatica    Past Surgical History:  Procedure Laterality Date  . CARPAL TUNNEL RELEASE Right   . CESAREAN SECTION  1996  . COLONOSCOPY  03/14/2002, 08/16/2007, 03/07/2013   [facility]-father  . COLONOSCOPY WITH PROPOFOL N/A 06/21/2018   Procedure: COLONOSCOPY WITH PROPOFOL;  Surgeon: Manya Silvas, MD;  Location: [facility] ENDOSCOPY;  Service: Endoscopy;  Laterality: N/A;  . CRANIOTOMY Left 06/25/2017   Procedure: CRANIOTOMY TEMPORAL LEFT FOR TUMOR RESECTION;  Surgeon: Ashok Pall, MD;  Location: [facility];  Service: Neurosurgery;  Laterality: Left;  . ESOPHAGOGASTRODUODENOSCOPY  03/14/2002, 03/07/2013  . ESOPHAGOGASTRODUODENOSCOPY (EGD) WITH PROPOFOL N/A 06/21/2018   Procedure: ESOPHAGOGASTRODUODENOSCOPY (EGD) WITH PROPOFOL;  Surgeon: Manya Silvas, MD;  Location: [facility] ENDOSCOPY;  Service: Endoscopy;  Laterality: N/A;  . ESOPHAGOGASTRODUODENOSCOPY (EGD) WITH PROPOFOL N/A 07/15/2019   Procedure: ESOPHAGOGASTRODUODENOSCOPY (EGD) WITH PROPOFOL;  Surgeon: Jonathon Bellows, MD;  Location: [facility] ENDOSCOPY;  Service: Gastroenterology;  Laterality: N/A;  . Post Septoplasty and turbinate reduction  2007  . TRIGGER FINGER RELEASE     Family History  Problem Relation Age of Onset  . Cancer Mother        Breast Cancer  . Breast cancer Mother 100  . Cancer Father        Colon Cancer  . Heart disease Father        Hx of MI  . Hypertension Father   . Diabetes Father   . Cancer Maternal Grandmother        lung cancer  . Cancer Maternal Uncle        esophageal   Social History   Socioeconomic History  . Marital status: Married  Spouse name: Not on file  . Number of children: Not on file  . Years of education: Not on file  . Highest education level: Not on file  Occupational History  . Not on file  Tobacco Use  . Smoking status: Never Smoker  . Smokeless tobacco: Never Used  Substance and Sexual Activity  . Alcohol use: Yes    Alcohol/week: 0.0 standard drinks    Comment: occasionally  . Drug use: No  . Sexual activity: Not on file  Other Topics Concern  . Not on file  Social History Narrative   Hairdresser    Social Determinants of Health   Financial Resource Strain:   . Difficulty of Paying Living Expenses:   Food  Insecurity:   . Worried About Charity fundraiser in the Last Year:   . Arboriculturist in the Last Year:   Transportation Needs:   . Film/video editor (Medical):   Marland Kitchen Lack of Transportation (Non-Medical):   Physical Activity:   . Days of Exercise per Week:   . Minutes of Exercise per Session:   Stress:   . Feeling of Stress :   Social Connections:   . Frequency of Communication with Friends and Family:   . Frequency of Social Gatherings with Friends and Family:   . Attends Religious Services:   . Active Member of Clubs or Organizations:   . Attends Archivist Meetings:   Marland Kitchen Marital Status:     Outpatient Encounter Medications as of 09/26/2019  Medication Sig  . albuterol (PROVENTIL HFA;VENTOLIN HFA) 108 (90 BASE) MCG/ACT inhaler Inhale 2 puffs into the lungs every 6 (six) hours as needed for wheezing or shortness of breath.  . benzonatate (TESSALON) 100 MG capsule Take 1 capsule (100 mg total) by mouth 3 (three) times daily as needed for cough.  . citalopram (CELEXA) 20 MG tablet Take 1 tablet (20 mg total) by mouth daily.  . cyclobenzaprine (FLEXERIL) 10 MG tablet Take 1 tablet by mouth at bedtime as needed.  Marland Kitchen Dexlansoprazole (DEXILANT) 30 MG capsule Take 1 capsule (30 mg total) by mouth daily.  . folic acid (FOLVITE) 1 MG tablet   . hydrochlorothiazide (HYDRODIURIL) 12.5 MG tablet Take 1 tablet (12.5 mg total) by mouth daily.  Marland Kitchen levocetirizine (XYZAL) 5 MG tablet Take 5 mg by mouth at bedtime.  Marland Kitchen losartan (COZAAR) 100 MG tablet Take 1 tablet (100 mg total) by mouth daily.  . medroxyPROGESTERone (DEPO-PROVERA) 150 MG/ML injection Inject 150 mg into the muscle every 3 (three) months.   . methotrexate (RHEUMATREX) 2.5 MG tablet Take 22.5 mg by mouth every Monday. Caution:Chemotherapy. Protect from light. Take 9 tabs po weekly with meals  . montelukast (SINGULAIR) 10 MG tablet take 1 tablet by mouth at bedtime  . Tocilizumab (ACTEMRA) 162 MG/0.9ML SOSY Inject into the  skin.  . valACYclovir (VALTREX) 500 MG tablet Take one tablet bid x 3 days.  May repeat for flares as directed.  . [DISCONTINUED] hydrochlorothiazide (HYDRODIURIL) 12.5 MG tablet Take 1 tablet (12.5 mg total) by mouth daily.  . [DISCONTINUED] losartan (COZAAR) 50 MG tablet Take 1 tablet (50 mg total) by mouth daily.   Facility-Administered Encounter Medications as of 09/26/2019  Medication  . betamethasone acetate-betamethasone sodium phosphate (CELESTONE) injection 12 mg    Review of Systems  Constitutional: Negative for appetite change and unexpected weight change.  HENT: Negative for congestion and sinus pressure.        Allergies appear to be under  reasonable control.   Eyes: Negative for pain and visual disturbance.  Respiratory: Negative for cough, chest tightness and shortness of breath.   Cardiovascular: Negative for chest pain and palpitations.  Gastrointestinal: Negative for abdominal pain, diarrhea, nausea and vomiting.  Genitourinary: Negative for difficulty urinating and dysuria.  Musculoskeletal: Negative for myalgias.       Joint pain and arthritis followed by Dr Jefm Bryant.  Leg pain as outlined.  Appears to be related to venous insufficiency.   Skin: Negative for color change and rash.  Neurological: Negative for dizziness and headaches.  Hematological: Negative for adenopathy. Does not bruise/bleed easily.  Psychiatric/Behavioral: Negative for agitation and dysphoric mood.       Objective:    Physical Exam Constitutional:      General: She is not in acute distress.    Appearance: Normal appearance. She is well-developed.  HENT:     Head: Normocephalic and atraumatic.     Right Ear: External ear normal.     Left Ear: External ear normal.  Eyes:     General: No scleral icterus.       Right eye: No discharge.        Left eye: No discharge.  Neck:     Thyroid: No thyromegaly.  Cardiovascular:     Rate and Rhythm: Normal rate and regular rhythm.     Pulses:  Normal pulses.  Pulmonary:     Effort: No tachypnea, accessory muscle usage or respiratory distress.     Breath sounds: Normal breath sounds. No decreased breath sounds or wheezing.  Chest:     Breasts:        Right: No inverted nipple, mass, nipple discharge or tenderness (no axillary adenopathy).        Left: No inverted nipple, mass, nipple discharge or tenderness (no axilarry adenopathy).  Abdominal:     General: Bowel sounds are normal.     Palpations: Abdomen is soft.     Tenderness: There is no abdominal tenderness.  Musculoskeletal:        General: No swelling or tenderness.     Cervical back: Neck supple. No tenderness.  Lymphadenopathy:     Cervical: No cervical adenopathy.  Skin:    Findings: No erythema or rash.  Neurological:     Mental Status: She is alert and oriented to person, place, and time.  Psychiatric:        Mood and Affect: Mood normal.        Behavior: Behavior normal.     BP 140/78   Pulse 78   Temp (!) 97.2 F (36.2 C)   Resp 18   Ht 5\' 1"  (1.549 m)   Wt 185 lb (83.9 kg)   SpO2 98%   BMI 34.96 kg/m  Wt Readings from Last 3 Encounters:  09/26/19 185 lb 12.8 oz (84.3 kg)  09/26/19 185 lb (83.9 kg)  09/12/19 185 lb 3.2 oz (84 kg)     Lab Results  Component Value Date   WBC 5.9 09/26/2019   HGB 13.1 09/26/2019   HCT 39.1 09/26/2019   PLT 259.0 09/26/2019   GLUCOSE 91 09/26/2019   CHOL 246 (H) 09/26/2019   TRIG 110.0 09/26/2019   HDL 56.20 09/26/2019   LDLCALC 168 (H) 09/26/2019   ALT 23 09/26/2019   AST 23 09/26/2019   NA 138 09/26/2019   K 4.1 09/26/2019   CL 105 09/26/2019   CREATININE 0.99 09/26/2019   BUN 13 09/26/2019   CO2 26 09/26/2019  TSH 1.97 04/25/2019    DG Tibia/Fibula Left  Result Date: 08/30/2019 CLINICAL DATA:  Leg pain and swelling lower leg. EXAM: LEFT TIBIA AND FIBULA - 2 VIEW COMPARISON:  None. FINDINGS: There are edematous soft tissue changes most pronounced over the lateral leg. No acute bony abnormality.  Specifically, no fracture, subluxation, or dislocation. Alignment at the knee and ankle is grossly preserved on these nondedicated radiographs. Bidirectional calcaneal spurs are noted incidentally. A small patellar enthesophyte is noted superiorly as well. IMPRESSION: Edematous soft tissue changes most pronounced over the lateral leg. No acute bony abnormality. Electronically Signed   By: Lovena Le M.D.   On: 08/30/2019 20:31   US Venous Img Lower Unilateral Left  Result Date: 08/30/2019 CLINICAL DATA:  Leg swelling EXAM: LEFT LOWER EXTREMITY VENOUS DOPPLER ULTRASOUND TECHNIQUE: Gray-scale sonography with compression, as well as color and duplex ultrasound, were performed to evaluate the deep venous system(s) from the level of the common femoral vein through the popliteal and proximal calf veins. COMPARISON:  None. FINDINGS: VENOUS Normal compressibility of the common femoral, superficial femoral, and popliteal veins, as well as the visualized calf veins. Visualized portions of profunda femoral vein and great saphenous vein unremarkable. No filling defects to suggest DVT on grayscale or color Doppler imaging. Doppler waveforms show normal direction of venous flow, normal respiratory phasicity and response to augmentation. Limited views of the contralateral common femoral vein are unremarkable. OTHER None. Limitations: none IMPRESSION: No femoropopliteal DVT nor evidence of DVT within the visualized calf veins. If clinical symptoms are inconsistent or if there are persistent or worsening symptoms, further imaging (possibly involving the iliac veins) may be warranted. Electronically Signed   By: Lovena Le M.D.   On: 08/30/2019 19:52       Assessment & Plan:   Problem List Items Addressed This Visit    Allergic rhinitis    Appears to be controlled on current regimen - xyzal, singulair, allegra and nasal spray.  Follow.       Anemia   Relevant Orders   CBC with Differential/Platelet (Completed)    Ferritin (Completed)   Anxiety    Continue citalopram.  Appears to be stable.  Follow.       Chronic venous insufficiency    Feel the pain in her legs are related to venous insufficiency.  Discussed compression hose.  She has appt with vascular surgery today for further evaluation.  Further treatment to be determined after their assessment.        Relevant Medications   losartan (COZAAR) 100 MG tablet   hydrochlorothiazide (HYDRODIURIL) 12.5 MG tablet   Essential hypertension, benign    Blood pressure elevated.  Increase losartan to 100mg  q day.  Follow pressures.  Follow metabolic panel.        Relevant Medications   losartan (COZAAR) 100 MG tablet   hydrochlorothiazide (HYDRODIURIL) 12.5 MG tablet   Health care maintenance    Physical today 09/26/19.  Mammogram 07/11/19 - Birads I.  Colonoscopy 06/21/18 - internal hemorrhoids.  Recommended f/u colonoscopy 05/2023.        Hypercholesterolemia    Low cholesterol diet and exercise.  Follow lipid panel.       Relevant Medications   losartan (COZAAR) 100 MG tablet   hydrochlorothiazide (HYDRODIURIL) 12.5 MG tablet   Other Relevant Orders   Lipid panel (Completed)   Hypothyroidism    On thyroid replacement.  Follow tsh.       Leg pain    Felt to be  related to venous insufficiency.  Has appt with vascular surgery today.  Further treatment pending their assessment.        Mild sleep apnea    Continue cpap.       Rheumatoid arthritis ([place])    Has been on actemra.  Followed by Dr Jefm Bryant.       Thyroid nodule    Evaluated by endocrinology.  Stable.  F/u 6 months.       Vitamin D deficiency    Follow vitamin D level.        Other Visit Diagnoses    Routine general medical examination at a health care facility    -  Primary   Essential hypertension       Relevant Medications   losartan (COZAAR) 100 MG tablet   hydrochlorothiazide (HYDRODIURIL) 12.5 MG tablet   Other Relevant Orders   Comprehensive metabolic panel  (Completed)   B12 deficiency       Relevant Orders   Vitamin B12 (Completed)       Einar Pheasant, MD

## 2019-09-26 NOTE — Assessment & Plan Note (Signed)
Physical today 09/26/19.  Mammogram 07/11/19 - Birads I.  Colonoscopy 06/21/18 - internal hemorrhoids.  Recommended f/u colonoscopy 05/2023.

## 2019-09-27 ENCOUNTER — Encounter: Payer: Self-pay | Admitting: Internal Medicine

## 2019-09-27 ENCOUNTER — Ambulatory Visit: Payer: 59 | Attending: Internal Medicine

## 2019-09-27 DIAGNOSIS — Z23 Encounter for immunization: Secondary | ICD-10-CM

## 2019-09-27 NOTE — Progress Notes (Signed)
   Covid-19 Vaccination Clinic  Name:  Tina Parker    MRN: [id number] DOB: 1971/02/15  09/27/2019  [name] was observed post Covid-19 immunization for 15 minutes without incident. She was provided with Vaccine Information Sheet and instruction to access the V-Safe system.   [name] was instructed to call 911 with any severe reactions post vaccine: Marland Kitchen Difficulty breathing  . Swelling of face and throat  . A fast heartbeat  . A bad rash all over body  . Dizziness and weakness   Immunizations Administered    Name Date Dose VIS Date Route   Pfizer COVID-19 Vaccine 09/27/2019  9:33 AM 0.3 mL 06/10/2019 Intramuscular   Manufacturer: [place]   Lot: [id number]   [place]: [id number]

## 2019-09-27 NOTE — Telephone Encounter (Signed)
Please forward this to Dr. Delana Meyer as he is the only one that can amend his office note.

## 2019-09-28 ENCOUNTER — Encounter (INDEPENDENT_AMBULATORY_CARE_PROVIDER_SITE_OTHER): Payer: Self-pay | Admitting: Vascular Surgery

## 2019-09-28 DIAGNOSIS — I872 Venous insufficiency (chronic) (peripheral): Secondary | ICD-10-CM | POA: Insufficient documentation

## 2019-09-28 NOTE — Addendum Note (Signed)
Addended by: Katha Cabal on: 09/28/2019 07:41 AM   Modules accepted: Level of Service

## 2019-10-03 ENCOUNTER — Encounter: Payer: Self-pay | Admitting: Internal Medicine

## 2019-10-03 NOTE — Assessment & Plan Note (Signed)
Has been on actemra.  Followed by Dr Jefm Bryant.

## 2019-10-03 NOTE — Assessment & Plan Note (Signed)
Continue cpap.  

## 2019-10-03 NOTE — Assessment & Plan Note (Signed)
Low cholesterol diet and exercise.  Follow lipid panel.   

## 2019-10-03 NOTE — Assessment & Plan Note (Signed)
Felt to be related to venous insufficiency.  Has appt with vascular surgery today.  Further treatment pending their assessment.

## 2019-10-03 NOTE — Assessment & Plan Note (Signed)
Blood pressure elevated.  Increase losartan to 100mg  q day.  Follow pressures.  Follow metabolic panel.

## 2019-10-03 NOTE — Assessment & Plan Note (Signed)
Feel the pain in her legs are related to venous insufficiency.  Discussed compression hose.  She has appt with vascular surgery today for further evaluation.  Further treatment to be determined after their assessment.

## 2019-10-03 NOTE — Assessment & Plan Note (Signed)
Evaluated by endocrinology.  Stable.  F/u 6 months.

## 2019-10-03 NOTE — Assessment & Plan Note (Signed)
Appears to be controlled on current regimen - xyzal, singulair, allegra and nasal spray.  Follow.

## 2019-10-03 NOTE — Assessment & Plan Note (Signed)
Continue citalopram.  Appears to be stable.  Follow.

## 2019-10-03 NOTE — Assessment & Plan Note (Signed)
Follow vitamin D level.  

## 2019-10-03 NOTE — Assessment & Plan Note (Signed)
On thyroid replacement.  Follow tsh.  

## 2019-10-05 MED ORDER — BENZONATATE 100 MG PO CAPS: 100.0000 mg | ORAL_CAPSULE | Freq: Three times a day (TID) | ORAL | 0 refills | Status: DC | PRN

## 2019-10-05 NOTE — Telephone Encounter (Signed)
She previously saw Dr Halford Chessman for sleep apnea.  She now needs f/u for persistent cough.  Can we schedule an appt.  See me about this if questions.

## 2019-10-17 DIAGNOSIS — M79642 Pain in left hand: Secondary | ICD-10-CM | POA: Insufficient documentation

## 2019-10-31 ENCOUNTER — Ambulatory Visit: Payer: 59 | Admitting: Internal Medicine

## 2019-11-01 ENCOUNTER — Ambulatory Visit (INDEPENDENT_AMBULATORY_CARE_PROVIDER_SITE_OTHER): Payer: 59

## 2019-11-01 ENCOUNTER — Other Ambulatory Visit: Payer: Self-pay

## 2019-11-01 DIAGNOSIS — Z3042 Encounter for surveillance of injectable contraceptive: Secondary | ICD-10-CM | POA: Diagnosis not present

## 2019-11-01 MED ORDER — MEDROXYPROGESTERONE ACETATE 150 MG/ML IM SUSP
150.0000 mg | Freq: Once | INTRAMUSCULAR | Status: AC
  Administered 2019-11-01: 150 mg via INTRAMUSCULAR

## 2019-11-01 NOTE — Progress Notes (Addendum)
Patient came in today for depo-provera in left deltoid. Patient tolerated well.   Reviewed.  Dr Nicki Reaper

## 2019-11-09 ENCOUNTER — Other Ambulatory Visit: Payer: Self-pay | Admitting: Internal Medicine

## 2019-11-14 ENCOUNTER — Ambulatory Visit: Payer: 59 | Admitting: Adult Health

## 2019-11-14 ENCOUNTER — Ambulatory Visit: Payer: 59 | Admitting: Internal Medicine

## 2019-11-21 ENCOUNTER — Encounter: Payer: Self-pay | Admitting: Internal Medicine

## 2019-11-21 ENCOUNTER — Ambulatory Visit: Payer: 59 | Admitting: Internal Medicine

## 2019-11-21 ENCOUNTER — Ambulatory Visit: Payer: 59 | Admitting: Adult Health

## 2019-11-21 ENCOUNTER — Other Ambulatory Visit: Payer: Self-pay

## 2019-11-21 DIAGNOSIS — M059 Rheumatoid arthritis with rheumatoid factor, unspecified: Secondary | ICD-10-CM

## 2019-11-21 DIAGNOSIS — R059 Cough, unspecified: Secondary | ICD-10-CM

## 2019-11-21 DIAGNOSIS — Z9109 Other allergy status, other than to drugs and biological substances: Secondary | ICD-10-CM

## 2019-11-21 DIAGNOSIS — G4730 Sleep apnea, unspecified: Secondary | ICD-10-CM | POA: Diagnosis not present

## 2019-11-21 DIAGNOSIS — E041 Nontoxic single thyroid nodule: Secondary | ICD-10-CM

## 2019-11-21 DIAGNOSIS — M79604 Pain in right leg: Secondary | ICD-10-CM

## 2019-11-21 DIAGNOSIS — R05 Cough: Secondary | ICD-10-CM

## 2019-11-21 DIAGNOSIS — E039 Hypothyroidism, unspecified: Secondary | ICD-10-CM

## 2019-11-21 DIAGNOSIS — G5602 Carpal tunnel syndrome, left upper limb: Secondary | ICD-10-CM

## 2019-11-21 DIAGNOSIS — M79605 Pain in left leg: Secondary | ICD-10-CM

## 2019-11-21 DIAGNOSIS — D329 Benign neoplasm of meninges, unspecified: Secondary | ICD-10-CM

## 2019-11-21 DIAGNOSIS — R1310 Dysphagia, unspecified: Secondary | ICD-10-CM

## 2019-11-21 DIAGNOSIS — E7800 Pure hypercholesterolemia, unspecified: Secondary | ICD-10-CM

## 2019-11-21 DIAGNOSIS — I10 Essential (primary) hypertension: Secondary | ICD-10-CM

## 2019-11-21 DIAGNOSIS — F419 Anxiety disorder, unspecified: Secondary | ICD-10-CM

## 2019-11-21 MED ORDER — CITALOPRAM HYDROBROMIDE 40 MG PO TABS: 40.0000 mg | ORAL_TABLET | Freq: Every day | ORAL | 3 refills | Status: DC

## 2019-11-21 MED ORDER — BENZONATATE 100 MG PO CAPS: 100.0000 mg | ORAL_CAPSULE | Freq: Three times a day (TID) | ORAL | 0 refills | Status: DC | PRN

## 2019-11-21 NOTE — Assessment & Plan Note (Signed)
On dexilant as outlined.  Occasional acid reflux - takes pepcid prn.  Does report feeling intermittently food sticking - epigastric region.  Has f/u with GI soon.  Plans to discuss.  They are following.

## 2019-11-21 NOTE — Assessment & Plan Note (Signed)
Evaluated by endocrinology.  Recommended f/u in 6 months.  Has f/u next month per pt.

## 2019-11-21 NOTE — Assessment & Plan Note (Signed)
Using cpap regularly   

## 2019-11-21 NOTE — Assessment & Plan Note (Signed)
Low cholesterol diet and exercise.  Follow lipid panel.   

## 2019-11-21 NOTE — Assessment & Plan Note (Signed)
S/p injection.  Did help some.  Follow.

## 2019-11-21 NOTE — Assessment & Plan Note (Signed)
Increased anxiety as outlined.  Discussed treatment options.  Will increase citalopram to 40mg  q day.  Follow closely.  Notify me if persistent problems.

## 2019-11-21 NOTE — Assessment & Plan Note (Signed)
Saw Dr Delana Meyer.  Advised compression hose.  Has helped some.  Still with some lateral leg discomfort. Has f/u soon.

## 2019-11-21 NOTE — Assessment & Plan Note (Signed)
Overall reasonably controlled on her current regimen.  Follow.

## 2019-11-21 NOTE — Assessment & Plan Note (Signed)
Tessalon perles prn.  On dexilant.  Continue inhalers.  Keep appt with pulmonary.

## 2019-11-21 NOTE — Assessment & Plan Note (Signed)
Blood pressure under good control.  Continue same medication regimen - losartan and hctz.   Follow pressures.  Follow metabolic panel.

## 2019-11-21 NOTE — Progress Notes (Signed)
Patient ID: Tina Parker, female   DOB: [birth year], 49 y.o.   MRN: [id number]   Subjective:    Patient ID: Tina Parker, female    DOB: September 12, 1970, 49 y.o.   MRN: [id number]  HPI This visit occurred during the SARS-CoV-2 public health emergency.  Safety protocols were in place, including screening questions prior to the visit, additional usage of staff PPE, and extensive cleaning of exam room while observing appropriate contact time as indicated for disinfecting solutions.  Patient here for a scheduled follow up.  She reports she is doing relatively well.  Cough is some better currently, but tends to flare at times.  Has f/u with pulmonary next month.  She is using cpap regularly.  No chest pain.  Still has the intermittent sensation of food sitting - epigastric region.  Occurs intermittently.  Sometimes if she bends wrong, will feel protrusion.  Taking dexilant 30mg  daily.  Recently decreased from 60mg .  Doing relatively well on 30mg  if she watches what she eats.  Does have some acid reflux at times and will take pepcid prn.  She did see Dr Delana Meyer for her legs.  Wearing compression hose.  Helps some, but still has discomfort in her left leg - lateral.  Has f/u next month with [facility].  She also saw ortho for CTS and trigger finger.  S/p injection for both.  Helped some.  Uses her hands a lot.  Reports blood pressure has been doing well.  Taking her medication regularly.  Increased stress.  On citalopram.  Feels is helping, but feels may need to adjust the dose.    Past Medical History:  Diagnosis Date  . Allergic rhinitis   . Anemia   . Arthritis    RA  . Carpal tunnel syndrome   . Dysphagia   . Esophageal spasm 01/16/2014  . GERD (gastroesophageal reflux disease)   . Graves disease    remission, no ablation, positive medical treatment  . History of hiatal hernia   . Hypertension   . Migraine headache    migraines  . PPD positive    hepatitis secondary to [place]  . Pure  hypercholesterolemia   . Rheumatoid arthritis(714.0)    positive anti CCP antibodies, positive RF, oligo-articular, MTX  . Sciatica    Past Surgical History:  Procedure Laterality Date  . CARPAL TUNNEL RELEASE Right   . CESAREAN SECTION  1996  . COLONOSCOPY  03/14/2002, 08/16/2007, 03/07/2013   [facility]-father  . COLONOSCOPY WITH PROPOFOL N/A 06/21/2018   Procedure: COLONOSCOPY WITH PROPOFOL;  Surgeon: Manya Silvas, MD;  Location: [facility] ENDOSCOPY;  Service: Endoscopy;  Laterality: N/A;  . CRANIOTOMY Left 06/25/2017   Procedure: CRANIOTOMY TEMPORAL LEFT FOR TUMOR RESECTION;  Surgeon: Ashok Pall, MD;  Location: [facility];  Service: Neurosurgery;  Laterality: Left;  . ESOPHAGOGASTRODUODENOSCOPY  03/14/2002, 03/07/2013  . ESOPHAGOGASTRODUODENOSCOPY (EGD) WITH PROPOFOL N/A 06/21/2018   Procedure: ESOPHAGOGASTRODUODENOSCOPY (EGD) WITH PROPOFOL;  Surgeon: Manya Silvas, MD;  Location: [facility] ENDOSCOPY;  Service: Endoscopy;  Laterality: N/A;  . ESOPHAGOGASTRODUODENOSCOPY (EGD) WITH PROPOFOL N/A 07/15/2019   Procedure: ESOPHAGOGASTRODUODENOSCOPY (EGD) WITH PROPOFOL;  Surgeon: Jonathon Bellows, MD;  Location: [facility] ENDOSCOPY;  Service: Gastroenterology;  Laterality: N/A;  . Post Septoplasty and turbinate reduction  2007  . TRIGGER FINGER RELEASE     Family History  Problem Relation Age of Onset  . Cancer Mother        Breast Cancer  . Breast cancer Mother 55  . Cancer Father  Colon Cancer  . Heart disease Father        Hx of MI  . Hypertension Father   . Diabetes Father   . Cancer Maternal Grandmother        lung cancer  . Cancer Maternal Uncle        esophageal   Social History   Socioeconomic History  . Marital status: Married    Spouse name: Not on file  . Number of children: Not on file  . Years of education: Not on file  . Highest education level: Not on file  Occupational History  . Not on file  Tobacco Use  . Smoking status: Never Smoker  . Smokeless tobacco: Never Used   Substance and Sexual Activity  . Alcohol use: Yes    Alcohol/week: 0.0 standard drinks    Comment: occasionally  . Drug use: No  . Sexual activity: Not on file  Other Topics Concern  . Not on file  Social History Narrative   Hairdresser    Social Determinants of Health   Financial Resource Strain:   . Difficulty of Paying Living Expenses:   Food Insecurity:   . Worried About Charity fundraiser in the Last Year:   . Arboriculturist in the Last Year:   Transportation Needs:   . Film/video editor (Medical):   Marland Kitchen Lack of Transportation (Non-Medical):   Physical Activity:   . Days of Exercise per Week:   . Minutes of Exercise per Session:   Stress:   . Feeling of Stress :   Social Connections:   . Frequency of Communication with Friends and Family:   . Frequency of Social Gatherings with Friends and Family:   . Attends Religious Services:   . Active Member of Clubs or Organizations:   . Attends Archivist Meetings:   Marland Kitchen Marital Status:     Outpatient Encounter Medications as of 11/21/2019  Medication Sig  . albuterol (PROVENTIL HFA;VENTOLIN HFA) 108 (90 BASE) MCG/ACT inhaler Inhale 2 puffs into the lungs every 6 (six) hours as needed for wheezing or shortness of breath.  . benzonatate (TESSALON) 100 MG capsule Take 1 capsule (100 mg total) by mouth 3 (three) times daily as needed for cough.  . cyclobenzaprine (FLEXERIL) 10 MG tablet Take 1 tablet by mouth at bedtime as needed.  Marland Kitchen Dexlansoprazole (DEXILANT) 30 MG capsule Take 1 capsule (30 mg total) by mouth daily.  . folic acid (FOLVITE) 1 MG tablet   . hydrochlorothiazide (HYDRODIURIL) 12.5 MG tablet Take 1 tablet (12.5 mg total) by mouth daily.  Marland Kitchen levocetirizine (XYZAL) 5 MG tablet Take 5 mg by mouth at bedtime.  Marland Kitchen losartan (COZAAR) 100 MG tablet Take 1 tablet (100 mg total) by mouth daily.  . medroxyPROGESTERone (DEPO-PROVERA) 150 MG/ML injection Inject 150 mg into the muscle every 3 (three) months.   .  methotrexate (RHEUMATREX) 2.5 MG tablet Take 22.5 mg by mouth every Monday. Caution:Chemotherapy. Protect from light. Take 9 tabs po weekly with meals  . montelukast (SINGULAIR) 10 MG tablet take 1 tablet by mouth at bedtime  . Tocilizumab (ACTEMRA) 162 MG/0.9ML SOSY Inject into the skin.  . [DISCONTINUED] benzonatate (TESSALON) 100 MG capsule Take 1 capsule (100 mg total) by mouth 3 (three) times daily as needed for cough.  . [DISCONTINUED] citalopram (CELEXA) 20 MG tablet TAKE 1 TABLET(20 MG) BY MOUTH DAILY  . citalopram (CELEXA) 40 MG tablet Take 1 tablet (40 mg total) by mouth daily.  Marland Kitchen  valACYclovir (VALTREX) 500 MG tablet Take one tablet bid x 3 days.  May repeat for flares as directed. (Patient not taking: Reported on 11/21/2019)   Facility-Administered Encounter Medications as of 11/21/2019  Medication  . betamethasone acetate-betamethasone sodium phosphate (CELESTONE) injection 12 mg    Review of Systems  Constitutional: Negative for appetite change and unexpected weight change.  HENT: Negative for sinus pressure.        Overall allergy symptoms doing better at this time.    Respiratory: Negative for chest tightness and shortness of breath.        Cough is better currently.    Cardiovascular: Negative for chest pain, palpitations and leg swelling.  Gastrointestinal: Negative for abdominal pain, diarrhea, nausea and vomiting.  Genitourinary: Negative for difficulty urinating and dysuria.  Musculoskeletal:       RA - affecting hands.  S/p trigger finger injection.    Skin: Negative for color change and rash.  Neurological: Negative for dizziness, light-headedness and headaches.  Psychiatric/Behavioral: Negative for agitation and dysphoric mood.       Objective:    Physical Exam Vitals reviewed.  Constitutional:      General: She is not in acute distress.    Appearance: Normal appearance.  HENT:     Head: Normocephalic and atraumatic.     Right Ear: External ear normal.      Left Ear: External ear normal.  Eyes:     General: No scleral icterus.       Right eye: No discharge.        Left eye: No discharge.     Conjunctiva/sclera: Conjunctivae normal.  Neck:     Thyroid: No thyromegaly.  Cardiovascular:     Rate and Rhythm: Normal rate and regular rhythm.  Pulmonary:     Effort: No respiratory distress.     Breath sounds: Normal breath sounds. No wheezing.  Abdominal:     General: Bowel sounds are normal.     Palpations: Abdomen is soft.     Tenderness: There is no abdominal tenderness.  Musculoskeletal:        General: No swelling or tenderness.     Cervical back: Neck supple. No tenderness.  Lymphadenopathy:     Cervical: No cervical adenopathy.  Skin:    Findings: No erythema or rash.  Neurological:     Mental Status: She is alert.  Psychiatric:        Mood and Affect: Mood normal.        Behavior: Behavior normal.     BP 124/80   Pulse 95   Temp (!) 97.4 F (36.3 C) (Temporal)   Ht 5\' 1"  (1.549 m)   Wt 184 lb 12.8 oz (83.8 kg)   LMP  (LMP Unknown)   SpO2 99%   BMI 34.92 kg/m  Wt Readings from Last 3 Encounters:  11/21/19 184 lb 12.8 oz (83.8 kg)  09/26/19 185 lb 12.8 oz (84.3 kg)  09/26/19 185 lb (83.9 kg)     Lab Results  Component Value Date   WBC 5.9 09/26/2019   HGB 13.1 09/26/2019   HCT 39.1 09/26/2019   PLT 259.0 09/26/2019   GLUCOSE 91 09/26/2019   CHOL 246 (H) 09/26/2019   TRIG 110.0 09/26/2019   HDL 56.20 09/26/2019   LDLCALC 168 (H) 09/26/2019   ALT 23 09/26/2019   AST 23 09/26/2019   NA 138 09/26/2019   K 4.1 09/26/2019   CL 105 09/26/2019   CREATININE 0.99 09/26/2019   BUN  13 09/26/2019   CO2 26 09/26/2019   TSH 1.97 04/25/2019    DG Tibia/Fibula Left  Result Date: 08/30/2019 CLINICAL DATA:  Leg pain and swelling lower leg. EXAM: LEFT TIBIA AND FIBULA - 2 VIEW COMPARISON:  None. FINDINGS: There are edematous soft tissue changes most pronounced over the lateral leg. No acute bony abnormality.  Specifically, no fracture, subluxation, or dislocation. Alignment at the knee and ankle is grossly preserved on these nondedicated radiographs. Bidirectional calcaneal spurs are noted incidentally. A small patellar enthesophyte is noted superiorly as well. IMPRESSION: Edematous soft tissue changes most pronounced over the lateral leg. No acute bony abnormality. Electronically Signed   By: Lovena Le M.D.   On: 08/30/2019 20:31   US Venous Img Lower Unilateral Left  Result Date: 08/30/2019 CLINICAL DATA:  Leg swelling EXAM: LEFT LOWER EXTREMITY VENOUS DOPPLER ULTRASOUND TECHNIQUE: Gray-scale sonography with compression, as well as color and duplex ultrasound, were performed to evaluate the deep venous system(s) from the level of the common femoral vein through the popliteal and proximal calf veins. COMPARISON:  None. FINDINGS: VENOUS Normal compressibility of the common femoral, superficial femoral, and popliteal veins, as well as the visualized calf veins. Visualized portions of profunda femoral vein and great saphenous vein unremarkable. No filling defects to suggest DVT on grayscale or color Doppler imaging. Doppler waveforms show normal direction of venous flow, normal respiratory phasicity and response to augmentation. Limited views of the contralateral common femoral vein are unremarkable. OTHER None. Limitations: none IMPRESSION: No femoropopliteal DVT nor evidence of DVT within the visualized calf veins. If clinical symptoms are inconsistent or if there are persistent or worsening symptoms, further imaging (possibly involving the iliac veins) may be warranted. Electronically Signed   By: Lovena Le M.D.   On: 08/30/2019 19:52       Assessment & Plan:   Problem List Items Addressed This Visit    Anxiety    Increased anxiety as outlined.  Discussed treatment options.  Will increase citalopram to 40mg  q day.  Follow closely.  Notify me if persistent problems.        Relevant Medications    citalopram (CELEXA) 40 MG tablet   Carpal tunnel syndrome    S/p injection.  Did help some.  Follow.        Relevant Medications   citalopram (CELEXA) 40 MG tablet   Cough    Tessalon perles prn.  On dexilant.  Continue inhalers.  Keep appt with pulmonary.        Dysphagia    On dexilant as outlined.  Occasional acid reflux - takes pepcid prn.  Does report feeling intermittently food sticking - epigastric region.  Has f/u with GI soon.  Plans to discuss.  They are following.        Environmental allergies    Overall reasonably controlled on her current regimen.  Follow.        Essential hypertension, benign    Blood pressure under good control.  Continue same medication regimen - losartan and hctz.   Follow pressures.  Follow metabolic panel.        Relevant Orders   Basic metabolic panel   Hypercholesterolemia    Low cholesterol diet and exercise.  Follow lipid panel.        Relevant Orders   Hepatic function panel   Lipid panel   Hypothyroidism    On thyroid replacement.  Follow tsh.        Leg pain    Saw  Dr Delana Meyer.  Advised compression hose.  Has helped some.  Still with some lateral leg discomfort. Has f/u soon.        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        Mild sleep apnea    Using cpap regularly.        Rheumatoid arthritis ([place])    Followed by Dr Jefm Bryant.  On MTX.  Stable.        Thyroid nodule    Evaluated by endocrinology.  Recommended f/u in 6 months.  Has f/u next month per pt.            Einar Pheasant, MD

## 2019-11-21 NOTE — Assessment & Plan Note (Signed)
Followed by Dr Kernodle.  On MTX.  Stable.  

## 2019-11-21 NOTE — Assessment & Plan Note (Signed)
Followed by neurosurgery (Dr Christella Noa).

## 2019-11-21 NOTE — Assessment & Plan Note (Signed)
On thyroid replacement.  Follow tsh.  

## 2019-12-02 ENCOUNTER — Ambulatory Visit: Payer: 59 | Admitting: Pulmonary Disease

## 2019-12-07 ENCOUNTER — Encounter: Payer: Self-pay | Admitting: Critical Care Medicine

## 2019-12-07 ENCOUNTER — Ambulatory Visit: Payer: 59 | Admitting: Critical Care Medicine

## 2019-12-07 ENCOUNTER — Ambulatory Visit (INDEPENDENT_AMBULATORY_CARE_PROVIDER_SITE_OTHER): Payer: 59

## 2019-12-07 ENCOUNTER — Other Ambulatory Visit: Payer: Self-pay

## 2019-12-07 VITALS — BP 124/82 | HR 78 | Ht 62.4 in | Wt 185.8 lb

## 2019-12-07 DIAGNOSIS — D849 Immunodeficiency, unspecified: Secondary | ICD-10-CM

## 2019-12-07 DIAGNOSIS — Z227 Latent tuberculosis: Secondary | ICD-10-CM | POA: Diagnosis not present

## 2019-12-07 DIAGNOSIS — R05 Cough: Secondary | ICD-10-CM | POA: Diagnosis not present

## 2019-12-07 DIAGNOSIS — R059 Cough, unspecified: Secondary | ICD-10-CM

## 2019-12-07 DIAGNOSIS — J309 Allergic rhinitis, unspecified: Secondary | ICD-10-CM | POA: Diagnosis not present

## 2019-12-07 MED ORDER — AZELASTINE HCL 0.1 % NA SOLN: 2.0000 | Freq: Two times a day (BID) | NASAL | 12 refills | Status: DC

## 2019-12-07 NOTE — Progress Notes (Signed)
Synopsis: Referred in 2020 for cough by Einar Pheasant, MD> Tina Parker is a patient of Dr. Halford Chessman.  Subjective:   PATIENT ID: Tina Parker GENDER: female DOB: Aug 06, 1970, MRN: [id number]  Chief Complaint  Patient presents with  . Consult    Productive/dry cough for years with thick white sputum.     Tina Parker is a 49 year old woman who presents for evaluation of cough.  She has previously been evaluated by Dr. Halford Chessman for obstructive sleep apnea.  She has had a chronic cough intermittently for years.  Her cough is dry.  She has shortness of breath only with coughing episodes, never with exertion or at rest.  She has recurrent episodes of bronchitis.  She had an episode of severe bronchitis in December 2019 and has had more recurrent coughing since then.  At that time her cough is productive, but has remained dry.  Her cough improves with using albuterol, cough drops, drinking water, and taking Tessalon Perles.  She has more symptoms at night.  She follows with Dr. Remus Blake in allergy and immunology, who was prescribed Qvar, rescue inhaler, Singulair, Xyzal, Flonase.  She was recently seen in urgent care on 11/28/2019 in [place] for an acute infection with fevers, throat irritation, myalgias, headache.  Her dog was also sick at the same time.  She was given doxycycline, sinus rinses, Mucinex with resolution of her symptoms.  At that time Covid and flu tests were negative.  Today she denies fever, chills, sweats, weight loss, change in appetite, chest pain, leg edema.  She has postnasal drip and uncontrolled GERD.  She has previously had side effects with many acid suppressing medications, but is doing well with esomeprazole.  She has breakthrough symptoms about 2 days/week.  She does not think her cough is always attributed to this because she knows when her reflux is worse because she can feel the acid in her throat.  No previous history of tobacco use or vaping.  Sometimes at night her cough is  bothersome enough that she has to discontinue using her CPAP.  Past medical history is notable for rheumatoid arthritis for which she takes methotrexate plus folic acid and Tocilizumab per rheumatologist Dr. Jefm Bryant [facility] clinic in [place]).  She started immunomodulating medications in 2013.  At that time she had a chest x-ray which demonstrated scarring.  This was performed due to history of positive PPD.  She has never tried treatment for latent TB.  No follow-up imaging has been done since developing her cough.    Past Medical History:  Diagnosis Date  . Allergic rhinitis   . Anemia   . Arthritis    RA  . Carpal tunnel syndrome   . Dysphagia   . Esophageal spasm 01/16/2014  . GERD (gastroesophageal reflux disease)   . Graves disease    remission, no ablation, positive medical treatment  . History of hiatal hernia   . Hypertension   . Migraine headache    migraines  . PPD positive    hepatitis secondary to [place]  . Pure hypercholesterolemia   . Rheumatoid arthritis(714.0)    positive anti CCP antibodies, positive RF, oligo-articular, MTX  . Sciatica      Family History  Problem Relation Age of Onset  . Cancer Mother        Breast Cancer  . Breast cancer Mother 68  . Cancer Father        Colon Cancer  . Heart disease Father  Hx of MI  . Hypertension Father   . Diabetes Father   . Cancer Maternal Grandmother        lung cancer  . Cancer Maternal Uncle        esophageal     Past Surgical History:  Procedure Laterality Date  . CARPAL TUNNEL RELEASE Right   . CESAREAN SECTION  1996  . COLONOSCOPY  03/14/2002, 08/16/2007, 03/07/2013   [facility]-father  . COLONOSCOPY WITH PROPOFOL N/A 06/21/2018   Procedure: COLONOSCOPY WITH PROPOFOL;  Surgeon: Manya Silvas, MD;  Location: [facility] ENDOSCOPY;  Service: Endoscopy;  Laterality: N/A;  . CRANIOTOMY Left 06/25/2017   Procedure: CRANIOTOMY TEMPORAL LEFT FOR TUMOR RESECTION;  Surgeon: Ashok Pall, MD;  Location: [place];  Service: Neurosurgery;  Laterality: Left;  . ESOPHAGOGASTRODUODENOSCOPY  03/14/2002, 03/07/2013  . ESOPHAGOGASTRODUODENOSCOPY (EGD) WITH PROPOFOL N/A 06/21/2018   Procedure: ESOPHAGOGASTRODUODENOSCOPY (EGD) WITH PROPOFOL;  Surgeon: Manya Silvas, MD;  Location: [facility] ENDOSCOPY;  Service: Endoscopy;  Laterality: N/A;  . ESOPHAGOGASTRODUODENOSCOPY (EGD) WITH PROPOFOL N/A 07/15/2019   Procedure: ESOPHAGOGASTRODUODENOSCOPY (EGD) WITH PROPOFOL;  Surgeon: Jonathon Bellows, MD;  Location: [facility] ENDOSCOPY;  Service: Gastroenterology;  Laterality: N/A;  . Post Septoplasty and turbinate reduction  2007  . TRIGGER FINGER RELEASE      Social History   Socioeconomic History  . Marital status: Married    Spouse name: Not on file  . Number of children: Not on file  . Years of education: Not on file  . Highest education level: Not on file  Occupational History  . Not on file  Tobacco Use  . Smoking status: Never Smoker  . Smokeless tobacco: Never Used  Substance and Sexual Activity  . Alcohol use: Yes    Alcohol/week: 0.0 standard drinks    Comment: occasionally  . Drug use: No  . Sexual activity: Not on file  Other Topics Concern  . Not on file  Social History Narrative   Hairdresser    Social Determinants of Health   Financial Resource Strain:   . Difficulty of Paying Living Expenses:   Food Insecurity:   . Worried About Charity fundraiser in the Last Year:   . Arboriculturist in the Last Year:   Transportation Needs:   . Film/video editor (Medical):   Marland Kitchen Lack of Transportation (Non-Medical):   Physical Activity:   . Days of Exercise per Week:   . Minutes of Exercise per Session:   Stress:   . Feeling of Stress :   Social Connections:   . Frequency of Communication with Friends and Family:   . Frequency of Social Gatherings with Friends and Family:   . Attends Religious Services:   . Active Member of Clubs or Organizations:   . Attends Archivist Meetings:   Marland Kitchen  Marital Status:   Intimate Partner Violence:   . Fear of Current or Ex-Partner:   . Emotionally Abused:   Marland Kitchen Physically Abused:   . Sexually Abused:      Allergies  Allergen Reactions  . Inh [Isoniazid] Other (See Comments)    Causes hepatitis     Immunization History  Administered Date(s) Administered  . Influenza,inj,Quad PF,6+ Mos 04/11/2013, 03/14/2014, 02/27/2015, 03/17/2016, 03/16/2017, 05/05/2018, 03/25/2019  . PFIZER SARS-COV-2 Vaccination 09/04/2019, 09/27/2019  . Zoster Recombinat (Shingrix) 04/18/2019    Outpatient Medications Prior to Visit  Medication Sig Dispense Refill  . albuterol (PROVENTIL HFA;VENTOLIN HFA) 108 (90 BASE) MCG/ACT inhaler Inhale 2 puffs into the lungs every  6 (six) hours as needed for wheezing or shortness of breath. 1 Inhaler 2  . benzonatate (TESSALON) 100 MG capsule Take 1 capsule (100 mg total) by mouth 3 (three) times daily as needed for cough. 21 capsule 0  . citalopram (CELEXA) 40 MG tablet Take 1 tablet (40 mg total) by mouth daily. 30 tablet 3  . cyclobenzaprine (FLEXERIL) 10 MG tablet Take 1 tablet by mouth at bedtime as needed.    Marland Kitchen Dexlansoprazole (DEXILANT) 30 MG capsule Take 1 capsule (30 mg total) by mouth daily. 90 capsule 1  . folic acid (FOLVITE) 1 MG tablet     . hydrochlorothiazide (HYDRODIURIL) 12.5 MG tablet Take 1 tablet (12.5 mg total) by mouth daily. 90 tablet 1  . levocetirizine (XYZAL) 5 MG tablet Take 5 mg by mouth at bedtime.    Marland Kitchen losartan (COZAAR) 100 MG tablet Take 1 tablet (100 mg total) by mouth daily. 30 tablet 2  . medroxyPROGESTERone (DEPO-PROVERA) 150 MG/ML injection Inject 150 mg into the muscle every 3 (three) months.     . methotrexate (RHEUMATREX) 2.5 MG tablet Take 22.5 mg by mouth every Monday. Caution:Chemotherapy. Protect from light. Take 9 tabs po weekly with meals    . montelukast (SINGULAIR) 10 MG tablet take 1 tablet by mouth at bedtime 30 tablet 3  . Tocilizumab (ACTEMRA) 162 MG/0.9ML SOSY Inject into  the skin.    . valACYclovir (VALTREX) 500 MG tablet Take one tablet bid x 3 days.  May repeat for flares as directed. 30 tablet 0   Facility-Administered Medications Prior to Visit  Medication Dose Route Frequency Provider Last Rate Last Admin  . betamethasone acetate-betamethasone sodium phosphate (CELESTONE) injection 12 mg  12 mg Other Once Magnus Sinning, MD        Review of Systems  Constitutional: Negative for chills, fever and weight loss.  HENT: Negative for congestion.        Post nasal drip  Respiratory: Positive for cough. Negative for sputum production, shortness of breath and wheezing.   Cardiovascular: Positive for leg swelling. Negative for chest pain.  Gastrointestinal: Positive for heartburn.  Musculoskeletal: Negative for joint pain and myalgias.  Skin: Negative for rash.  Neurological: Negative.   Endo/Heme/Allergies: Positive for environmental allergies.     Objective:   Vitals:   12/07/19 0858  BP: 124/82  Pulse: 78  SpO2: 97%  Weight: 185 lb 12.8 oz (84.3 kg)  Height: 5' 2.4" (1.585 m)   97% on   RA BMI Readings from Last 3 Encounters:  12/07/19 33.55 kg/m  11/21/19 34.92 kg/m  09/26/19 35.11 kg/m   Wt Readings from Last 3 Encounters:  12/07/19 185 lb 12.8 oz (84.3 kg)  11/21/19 184 lb 12.8 oz (83.8 kg)  09/26/19 185 lb 12.8 oz (84.3 kg)    Physical Exam Vitals reviewed.  Constitutional:      Appearance: Normal appearance. She is obese. She is not ill-appearing.  HENT:     Head: Normocephalic and atraumatic.     Mouth/Throat:     Comments: Hoarseness, frequent throat clearing Eyes:     General: No scleral icterus. Cardiovascular:     Rate and Rhythm: Normal rate and regular rhythm.     Heart sounds: No murmur.     Comments: Gastric sounds over precordium Pulmonary:     Comments: CTAB, breathing comfortably on RA. Speaking in full sentences. Occasional dry cough. Abdominal:     General: There is no distension.     Palpations:  Abdomen is  soft.  Musculoskeletal:        General: No swelling or deformity.     Cervical back: Neck supple.  Lymphadenopathy:     Cervical: No cervical adenopathy.  Skin:    General: Skin is warm and dry.     Findings: No rash.  Neurological:     General: No focal deficit present.     Mental Status: She is alert.     Coordination: Coordination normal.  Psychiatric:        Mood and Affect: Mood normal.        Behavior: Behavior normal.      CBC    Component Value Date/Time   WBC 5.9 09/26/2019 1042   RBC 4.09 09/26/2019 1042   HGB 13.1 09/26/2019 1042   HCT 39.1 09/26/2019 1042   PLT 259.0 09/26/2019 1042   MCV 95.6 09/26/2019 1042   MCH 30.8 06/22/2017 0816   MCHC 33.6 09/26/2019 1042   RDW 14.2 09/26/2019 1042   LYMPHSABS 1.3 09/26/2019 1042   MONOABS 0.6 09/26/2019 1042   EOSABS 0.1 09/26/2019 1042   BASOSABS 0.0 09/26/2019 1042    CHEMISTRY No results for input(s): NA, K, CL, CO2, GLUCOSE, BUN, CREATININE, CALCIUM, MG, PHOS in the last 168 hours. CrCl cannot be calculated (Patient's most recent lab result is older than the maximum 21 days allowed.).   Chest Imaging- films reviewed: CXR, 2 view  12/07/2019- increased basilar opacities, no mediastinal or hilar adenopathy. No significant upper lobe scars are abnormalities.  Pulmonary Functions Testing Results: No flowsheet data found.   Assessment & Plan:     ICD-10-CM   1. Cough  R05 DG Chest 2 View    Pulmonary function test  2. Allergic rhinitis, unspecified seasonality, unspecified trigger  J30.9   3. Immunocompromised state ([place])  D84.9 DG Chest 2 View    Pulmonary function test  4. LTBI (latent tuberculosis infection)  Z22.7 DG Chest 2 View    Chronic cough-most likely due to uncontrolled GERD or allergic rhinosinusitis.  Of concern is her history of chronic rheumatoid arthritis on immunosuppression and history of latent TB not previously treated. Cannot rule out infectious or RA-associated ILD. -CXR  today -Continue Qvar and albuterol as needed.  May need to escalate to ICS-LABA, PFT results. -Continues omeprazole for GERD -PFTs  GERD -Continues omeprazole for GERD  Allergic rhinosinusitis -Continue montelukast, Xyzal, Flonase. -Start azelastine nasal spray 2 sprays bilaterally twice daily  History of LTBI -CXR today--no evidence of previous upper lobe fibrotic disease or cavities.   Follow up with Dr. Halford Chessman or Derl Barrow after PFTs.   Current Outpatient Medications:  .  albuterol (PROVENTIL HFA;VENTOLIN HFA) 108 (90 BASE) MCG/ACT inhaler, Inhale 2 puffs into the lungs every 6 (six) hours as needed for wheezing or shortness of breath., Disp: 1 Inhaler, Rfl: 2 .  benzonatate (TESSALON) 100 MG capsule, Take 1 capsule (100 mg total) by mouth 3 (three) times daily as needed for cough., Disp: 21 capsule, Rfl: 0 .  citalopram (CELEXA) 40 MG tablet, Take 1 tablet (40 mg total) by mouth daily., Disp: 30 tablet, Rfl: 3 .  cyclobenzaprine (FLEXERIL) 10 MG tablet, Take 1 tablet by mouth at bedtime as needed., Disp: , Rfl:  .  Dexlansoprazole (DEXILANT) 30 MG capsule, Take 1 capsule (30 mg total) by mouth daily., Disp: 90 capsule, Rfl: 1 .  folic acid (FOLVITE) 1 MG tablet, , Disp: , Rfl:  .  hydrochlorothiazide (HYDRODIURIL) 12.5 MG tablet, Take 1 tablet (12.5 mg  total) by mouth daily., Disp: 90 tablet, Rfl: 1 .  levocetirizine (XYZAL) 5 MG tablet, Take 5 mg by mouth at bedtime., Disp: , Rfl:  .  losartan (COZAAR) 100 MG tablet, Take 1 tablet (100 mg total) by mouth daily., Disp: 30 tablet, Rfl: 2 .  medroxyPROGESTERone (DEPO-PROVERA) 150 MG/ML injection, Inject 150 mg into the muscle every 3 (three) months. , Disp: , Rfl:  .  methotrexate (RHEUMATREX) 2.5 MG tablet, Take 22.5 mg by mouth every Monday. Caution:Chemotherapy. Protect from light. Take 9 tabs po weekly with meals, Disp: , Rfl:  .  montelukast (SINGULAIR) 10 MG tablet, take 1 tablet by mouth at bedtime, Disp: 30 tablet, Rfl: 3 .   Tocilizumab (ACTEMRA) 162 MG/0.9ML SOSY, Inject into the skin., Disp: , Rfl:  .  valACYclovir (VALTREX) 500 MG tablet, Take one tablet bid x 3 days.  May repeat for flares as directed., Disp: 30 tablet, Rfl: 0 .  azelastine (ASTELIN) 0.1 % nasal spray, Place 2 sprays into both nostrils 2 (two) times daily. Use in each nostril as directed, Disp: 30 mL, Rfl: 12  Current Facility-Administered Medications:  .  betamethasone acetate-betamethasone sodium phosphate (CELESTONE) injection 12 mg, 12 mg, Other, Once, Magnus Sinning, MD      Julian Hy, DO [facility] 12/07/2019 10:32 AM

## 2019-12-07 NOTE — Patient Instructions (Addendum)
Thank you for visiting Dr. Carlis Abbott at [facility]. We recommend the following: Orders Placed This Encounter  Procedures  . DG Chest 2 View  . Pulmonary function test   Orders Placed This Encounter  Procedures  . DG Chest 2 View    Standing Status:   Future    Standing Expiration Date:   12/06/2020    Order Specific Question:   Reason for Exam (SYMPTOM  OR DIAGNOSIS REQUIRED)    Answer:   cough, immunosuppressed    Order Specific Question:   Is patient pregnant?    Answer:   Unknown (Please Explain)    Order Specific Question:   Preferred imaging location?    Answer:   Internal    Order Specific Question:   Radiology Contrast Protocol - do NOT remove file path    Answer:   [email or web address]  . Pulmonary function test    Standing Status:   Future    Standing Expiration Date:   12/06/2020    Order Specific Question:   Where should this test be performed?    Answer:   [facility]    Meds ordered this encounter  Medications  . azelastine (ASTELIN) 0.1 % nasal spray    Sig: Place 2 sprays into both nostrils 2 (two) times daily. Use in each nostril as directed    Dispense:  30 mL    Refill:  12    Return in about 4 weeks (around 01/04/2020). with Dr. Halford Chessman or APP after PFTs.    Please do your part to reduce the spread of COVID-19.   Food Choices for Gastroesophageal Reflux Disease, Adult When you have gastroesophageal reflux disease (GERD), the foods you eat and your eating habits are very important. Choosing the right foods can help ease your discomfort. Think about working with a nutrition specialist (dietitian) to help you make good choices. What are tips for following this plan?  Meals  Choose healthy foods that are low in fat, such as fruits, vegetables, whole grains, low-fat dairy products, and lean meat, fish, and poultry.  Eat small meals often instead of 3 large meals a day. Eat your meals slowly, and in a place where you  are relaxed. Avoid bending over or lying down until 2-3 hours after eating.  Avoid eating meals 2-3 hours before bed.  Avoid drinking a lot of liquid with meals.  Cook foods using methods other than frying. Bake, grill, or broil food instead.  Avoid or limit: ? Chocolate. ? Peppermint or spearmint. ? Alcohol. ? Pepper. ? Black and decaffeinated coffee. ? Black and decaffeinated tea. ? Bubbly (carbonated) soft drinks. ? Caffeinated energy drinks and soft drinks.  Limit high-fat foods such as: ? Fatty meat or fried foods. ? Whole milk, cream, butter, or ice cream. ? Nuts and nut butters. ? Pastries, donuts, and sweets made with butter or shortening.  Avoid foods that cause symptoms. These foods may be different for everyone. Common foods that cause symptoms include: ? Tomatoes. ? Oranges, lemons, and limes. ? Peppers. ? Spicy food. ? Onions and garlic. ? Vinegar. Lifestyle  Maintain a healthy weight. Ask your doctor what weight is healthy for you. If you need to lose weight, work with your doctor to do so safely.  Exercise for at least 30 minutes for 5 or more days each week, or as told by your doctor.  Wear loose-fitting clothes.  Do not smoke. If you need help quitting, ask your doctor.  Sleep with the head of  your bed higher than your feet. Use a wedge under the mattress or blocks under the bed frame to raise the head of the bed. Summary  When you have gastroesophageal reflux disease (GERD), food and lifestyle choices are very important in easing your symptoms.  Eat small meals often instead of 3 large meals a day. Eat your meals slowly, and in a place where you are relaxed.  Limit high-fat foods such as fatty meat or fried foods.  Avoid bending over or lying down until 2-3 hours after eating.  Avoid peppermint and spearmint, caffeine, alcohol, and chocolate. This information is not intended to replace advice given to you by your health care provider. Make sure  you discuss any questions you have with your health care provider. Document Revised: 10/07/2018 Document Reviewed: 07/22/2016 Elsevier Patient Education  [place].

## 2019-12-12 DIAGNOSIS — M65352 Trigger finger, left little finger: Secondary | ICD-10-CM | POA: Insufficient documentation

## 2019-12-23 ENCOUNTER — Other Ambulatory Visit (INDEPENDENT_AMBULATORY_CARE_PROVIDER_SITE_OTHER): Payer: Self-pay | Admitting: Vascular Surgery

## 2019-12-23 DIAGNOSIS — M79605 Pain in left leg: Secondary | ICD-10-CM

## 2019-12-23 DIAGNOSIS — M79604 Pain in right leg: Secondary | ICD-10-CM

## 2019-12-23 DIAGNOSIS — I872 Venous insufficiency (chronic) (peripheral): Secondary | ICD-10-CM

## 2019-12-26 ENCOUNTER — Encounter (INDEPENDENT_AMBULATORY_CARE_PROVIDER_SITE_OTHER): Payer: Self-pay | Admitting: Nurse Practitioner

## 2019-12-26 ENCOUNTER — Ambulatory Visit (INDEPENDENT_AMBULATORY_CARE_PROVIDER_SITE_OTHER): Payer: 59

## 2019-12-26 ENCOUNTER — Other Ambulatory Visit: Payer: Self-pay

## 2019-12-26 ENCOUNTER — Ambulatory Visit (INDEPENDENT_AMBULATORY_CARE_PROVIDER_SITE_OTHER): Payer: 59 | Admitting: Nurse Practitioner

## 2019-12-26 VITALS — BP 119/83 | HR 102 | Ht 61.0 in | Wt 186.0 lb

## 2019-12-26 DIAGNOSIS — I872 Venous insufficiency (chronic) (peripheral): Secondary | ICD-10-CM

## 2019-12-26 DIAGNOSIS — M79605 Pain in left leg: Secondary | ICD-10-CM | POA: Diagnosis not present

## 2019-12-26 DIAGNOSIS — M79604 Pain in right leg: Secondary | ICD-10-CM

## 2019-12-26 DIAGNOSIS — I10 Essential (primary) hypertension: Secondary | ICD-10-CM

## 2019-12-27 ENCOUNTER — Encounter (INDEPENDENT_AMBULATORY_CARE_PROVIDER_SITE_OTHER): Payer: Self-pay | Admitting: Nurse Practitioner

## 2019-12-27 ENCOUNTER — Other Ambulatory Visit: Payer: Self-pay | Admitting: Internal Medicine

## 2019-12-27 NOTE — Progress Notes (Signed)
Subjective:    Patient ID: Tina Parker, female    DOB: 12/11/1970, 49 y.o.   MRN: [id number] Chief Complaint  Patient presents with  . Follow-up    U/S follow up    The patient presents today for follow-up in regards to varicosities in addition to lower extremity leg swelling.  The patient notes that she has some uncomfortable feelings with a stinging sensation in the legs.  She does note that this stinging sensation is worse as the day progresses.  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  The patient does wear medical grade 1 compression stockings however she does not wear them consistently on days that she works due to the fact that they are uncomfortable and very hot.  She does estimate she wears them approximately 2 or so days a week.  The patient notes that she has had spider veins bilaterally for years however the swelling and the stinging has recently become much more unbearable.  She denies any fever, chills, nausea, vomiting or diarrhea.  Today noninvasive studies show no evidence of DVT or superficial venous thrombosis bilaterally.  No evidence of deep venous insufficiency seen bilaterally.  No evidence of superficial venous reflux seen in the great or short saphenous veins bilaterally.   Review of Systems  Cardiovascular: Positive for leg swelling.  All other systems reviewed and are negative.      Objective:   Physical Exam Vitals reviewed.  HENT:     Head: Normocephalic.  Cardiovascular:     Rate and Rhythm: Normal rate and regular rhythm.  Pulmonary:     Effort: Pulmonary effort is normal.     Breath sounds: Normal breath sounds.  Musculoskeletal:     Right lower leg: 1+ Edema present.     Left lower leg: 1+ Edema present.  Skin:    General: Skin is warm and dry.  Neurological:     Mental Status: She is alert and oriented to person, place, and time.    Psychiatric:        Mood and Affect: Mood normal.        Behavior: Behavior normal.        Thought Content: Thought content normal.        Judgment: Judgment normal.     BP 119/83   Pulse (!) 102   Ht 5\' 1"  (1.549 m)   Wt 186 lb (84.4 kg)   BMI 35.14 kg/m   Past Medical History:  Diagnosis Date  . Allergic rhinitis   . Anemia   . Arthritis    RA  . Carpal tunnel syndrome   . Dysphagia   . Esophageal spasm 01/16/2014  . GERD (gastroesophageal reflux disease)   . Graves disease    remission, no ablation, positive medical treatment  . History of hiatal hernia   . Hypertension   . Migraine headache    migraines  . PPD positive    hepatitis secondary to [place]  . Pure hypercholesterolemia   . Rheumatoid arthritis(714.0)    positive anti CCP antibodies, positive RF, oligo-articular, MTX  . Sciatica     Social History   Socioeconomic History  . Marital status: Married    Spouse name: Not on file  . Number of children: Not on file  . Years of education: Not on file  . Highest education level: Not on file  Occupational History  .  Not on file  Tobacco Use  . Smoking status: Never Smoker  . Smokeless tobacco: Never Used  Vaping Use  . Vaping Use: Never used  Substance and Sexual Activity  . Alcohol use: Yes    Alcohol/week: 0.0 standard drinks    Comment: occasionally  . Drug use: No  . Sexual activity: Not on file  Other Topics Concern  . Not on file  Social History Narrative   Hairdresser    Social Determinants of Health   Financial Resource Strain:   . Difficulty of Paying Living Expenses:   Food Insecurity:   . Worried About Charity fundraiser in the Last Year:   . Arboriculturist in the Last Year:   Transportation Needs:   . Film/video editor (Medical):   Marland Kitchen Lack of Transportation (Non-Medical):   Physical Activity:   . Days of Exercise per Week:   . Minutes of Exercise per Session:   Stress:   . Feeling of Stress :   Social Connections:    . Frequency of Communication with Friends and Family:   . Frequency of Social Gatherings with Friends and Family:   . Attends Religious Services:   . Active Member of Clubs or Organizations:   . Attends Archivist Meetings:   Marland Kitchen Marital Status:   Intimate Partner Violence:   . Fear of Current or Ex-Partner:   . Emotionally Abused:   Marland Kitchen Physically Abused:   . Sexually Abused:     Past Surgical History:  Procedure Laterality Date  . CARPAL TUNNEL RELEASE Right   . CESAREAN SECTION  1996  . COLONOSCOPY  03/14/2002, 08/16/2007, 03/07/2013   [facility]-father  . COLONOSCOPY WITH PROPOFOL N/A 06/21/2018   Procedure: COLONOSCOPY WITH PROPOFOL;  Surgeon: Manya Silvas, MD;  Location: [facility] ENDOSCOPY;  Service: Endoscopy;  Laterality: N/A;  . CRANIOTOMY Left 06/25/2017   Procedure: CRANIOTOMY TEMPORAL LEFT FOR TUMOR RESECTION;  Surgeon: Ashok Pall, MD;  Location: [place];  Service: Neurosurgery;  Laterality: Left;  . ESOPHAGOGASTRODUODENOSCOPY  03/14/2002, 03/07/2013  . ESOPHAGOGASTRODUODENOSCOPY (EGD) WITH PROPOFOL N/A 06/21/2018   Procedure: ESOPHAGOGASTRODUODENOSCOPY (EGD) WITH PROPOFOL;  Surgeon: Manya Silvas, MD;  Location: [facility] ENDOSCOPY;  Service: Endoscopy;  Laterality: N/A;  . ESOPHAGOGASTRODUODENOSCOPY (EGD) WITH PROPOFOL N/A 07/15/2019   Procedure: ESOPHAGOGASTRODUODENOSCOPY (EGD) WITH PROPOFOL;  Surgeon: Jonathon Bellows, MD;  Location: [facility] ENDOSCOPY;  Service: Gastroenterology;  Laterality: N/A;  . Post Septoplasty and turbinate reduction  2007  . TRIGGER FINGER RELEASE      Family History  Problem Relation Age of Onset  . Cancer Mother        Breast Cancer  . Breast cancer Mother 44  . Cancer Father        Colon Cancer  . Heart disease Father        Hx of MI  . Hypertension Father   . Diabetes Father   . Cancer Maternal Grandmother        lung cancer  . Cancer Maternal Uncle        esophageal    Allergies  Allergen Reactions  . Inh [Isoniazid] Other (See  Comments)    Causes hepatitis       Assessment & Plan:   1. Chronic venous insufficiency No surgery or intervention at this point in time.  I have reviewed my discussion with the patient regarding venous insufficiency and why it causes symptoms. I have discussed with the patient the chronic skin changes that accompany venous insufficiency  and the long term sequela such as ulceration. Patient will contnue wearing graduated compression stockings on a daily basis, as this has provided excellent control of his edema. The patient will put the stockings on first thing in the morning and removing them in the evening. The patient is reminded not to sleep in the stockings.  In addition, behavioral modification including elevation during the day will be initiated. Exercise is strongly encouraged.   The patient will follow up with me PRN should anything change.  The patient voices agreement with this plan.   2. Essential hypertension, benign Patient has good blood pressure control today.  Patient is on appropriate medications.  No changes made today.   Current Outpatient Medications on File Prior to Visit  Medication Sig Dispense Refill  . albuterol (PROVENTIL HFA;VENTOLIN HFA) 108 (90 BASE) MCG/ACT inhaler Inhale 2 puffs into the lungs every 6 (six) hours as needed for wheezing or shortness of breath. 1 Inhaler 2  . azelastine (ASTELIN) 0.1 % nasal spray Place 2 sprays into both nostrils 2 (two) times daily. Use in each nostril as directed 30 mL 12  . benzonatate (TESSALON) 100 MG capsule Take 1 capsule (100 mg total) by mouth 3 (three) times daily as needed for cough. 21 capsule 0  . citalopram (CELEXA) 40 MG tablet Take 1 tablet (40 mg total) by mouth daily. 30 tablet 3  . cyclobenzaprine (FLEXERIL) 10 MG tablet Take 1 tablet by mouth at bedtime as needed.    Marland Kitchen Dexlansoprazole (DEXILANT) 30 MG capsule Take 1 capsule (30 mg total) by mouth daily. 90 capsule 1  . folic acid (FOLVITE) 1 MG tablet      . hydrochlorothiazide (HYDRODIURIL) 12.5 MG tablet Take 1 tablet (12.5 mg total) by mouth daily. 90 tablet 1  . levocetirizine (XYZAL) 5 MG tablet Take 5 mg by mouth at bedtime.    Marland Kitchen losartan (COZAAR) 100 MG tablet Take 1 tablet (100 mg total) by mouth daily. 30 tablet 2  . medroxyPROGESTERone (DEPO-PROVERA) 150 MG/ML injection Inject 150 mg into the muscle every 3 (three) months.     . methocarbamol (ROBAXIN) 750 MG tablet methocarbamol 750 mg tablet    . methotrexate (RHEUMATREX) 2.5 MG tablet Take 22.5 mg by mouth every Monday. Caution:Chemotherapy. Protect from light. Take 9 tabs po weekly with meals    . montelukast (SINGULAIR) 10 MG tablet take 1 tablet by mouth at bedtime 30 tablet 3  . QVAR REDIHALER 80 MCG/ACT inhaler SMARTSIG:2 Puff(s) By Mouth Twice Daily    . Tocilizumab (ACTEMRA) 162 MG/0.9ML SOSY Inject into the skin.    . valACYclovir (VALTREX) 500 MG tablet Take one tablet bid x 3 days.  May repeat for flares as directed. 30 tablet 0   Current Facility-Administered Medications on File Prior to Visit  Medication Dose Route Frequency Provider Last Rate Last Admin  . betamethasone acetate-betamethasone sodium phosphate (CELESTONE) injection 12 mg  12 mg Other Once Magnus Sinning, MD        There are no Patient Instructions on file for this visit. No follow-ups on file.   Kris Hartmann, NP

## 2020-01-10 ENCOUNTER — Ambulatory Visit (INDEPENDENT_AMBULATORY_CARE_PROVIDER_SITE_OTHER): Payer: 59 | Admitting: Orthopaedic Surgery

## 2020-01-10 ENCOUNTER — Ambulatory Visit: Payer: Self-pay

## 2020-01-10 ENCOUNTER — Encounter: Payer: Self-pay | Admitting: Orthopaedic Surgery

## 2020-01-10 VITALS — Ht 62.0 in | Wt 187.0 lb

## 2020-01-10 DIAGNOSIS — M1611 Unilateral primary osteoarthritis, right hip: Secondary | ICD-10-CM | POA: Diagnosis not present

## 2020-01-10 NOTE — Progress Notes (Signed)
Office Visit Note   Patient: Tina Parker           Date of Birth: [birth year]           MRN: [id number] Visit Date: 01/10/2020              Requested by: Einar Pheasant, MD [address] [place],  [place] [id number] PCP: Einar Pheasant, MD   Assessment & Plan: Visit Diagnoses:  1. Primary osteoarthritis of right hip     Plan: Impression is right hip arthritis flareup.  We have referred the patient back to Dr. Junius Roads for repeat cortisone injection.  She will follow up with [place] as needed.  Follow-Up Instructions: Return for with Dr. Junius Roads for right hip injection.   Orders:  Orders Placed This Encounter  Procedures  . XR HIP UNILAT W OR W/O PELVIS 2-3 VIEWS RIGHT   No orders of the defined types were placed in this encounter.     Procedures: No procedures performed   Clinical Data: No additional findings.   Subjective: Chief Complaint  Patient presents with  . Right Hip - Pain    HPI patient is a pleasant 49 year old female who comes in today with recurrent right hip pain.  She has experienced several years of right hip pain without any specific injury.  She does have a history of rheumatoid arthritis and is on methotrexate.  She underwent right hip cortisone injection back in 2019 with great relief of symptoms until recently.  She comes in today with recurrent pain.  The majority of her pain is to the groin.  Pain is worse with walking and jumping.  She has tried Tylenol without relief of symptoms.  Review of Systems as detailed in HPI.  All others reviewed and are negative.   Objective: Vital Signs: Ht 5\' 2"  (1.575 m)   Wt 187 lb (84.8 kg)   BMI 34.20 kg/m   Physical Exam well-developed well-nourished female no acute distress.  Alert oriented x3.  Ortho Exam examination of her right hip reveals a markedly positive logroll and FADIR.  She is neurovascularly intact distally.  Specialty Comments:  No specialty comments available.   Imaging: XR HIP UNILAT W OR W/O PELVIS 2-3 VIEWS RIGHT  Result Date: 01/10/2020 Mild to moderate degenerative changes.    PMFS History: Patient Active Problem List   Diagnosis Date Noted  . Acquired trigger finger of left little finger 12/12/2019  . Pain of left hand 10/17/2019  . Chronic venous insufficiency 09/28/2019  . Allergic rhinitis 09/26/2019  . Carpal tunnel syndrome 09/26/2019  . Hiatal hernia 09/26/2019  . Hx of migraine headaches 09/26/2019  . Positive PPD 09/26/2019  . Leg pain 09/12/2019  . Mild sleep apnea 08/08/2019  . Thyroid nodule 05/18/2019  . Thyroid fullness 05/01/2019  . Bilateral wrist pain 02/21/2019  . Radial styloid tenosynovitis 10/27/2018  . Anxiety 06/08/2018  . Sinusitis 06/08/2018  . History of meningioma of the brain 12/21/2017  . Herpes 07/23/2017  . 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 ([place]) 06/25/2017  . Pain of both hip joints 05/18/2017  . Vitamin D deficiency 09/21/2016  . Cough 01/07/2015  . Difficulty sleeping 01/07/2015  . Health care maintenance 08/02/2014  . Snoring 03/19/2014  . Dysphagia 01/15/2014  . BMI 32.0-32.9,adult 12/11/2013  . Skin lesion 12/11/2013  . Family history of breast cancer 10/22/2013  . Environmental allergies 10/22/2013  . Essential hypertension, benign 09/15/2012  . Hypercholesterolemia 09/15/2012  . Rheumatoid arthritis (  [place]) 09/15/2012  . Family history of colon cancer 09/15/2012  . Anemia 09/15/2012  . Hypothyroidism 09/15/2012   Past Medical History:  Diagnosis Date  . Allergic rhinitis   . Anemia   . Arthritis    RA  . Carpal tunnel syndrome   . Dysphagia   . Esophageal spasm 01/16/2014  . GERD (gastroesophageal reflux disease)   . Graves disease    remission, no ablation, positive medical treatment  . History of hiatal hernia   . Hypertension   . Migraine headache    migraines  . PPD positive    hepatitis secondary to [place]  . Pure hypercholesterolemia   .  Rheumatoid arthritis(714.0)    positive anti CCP antibodies, positive RF, oligo-articular, MTX  . Sciatica     Family History  Problem Relation Age of Onset  . Cancer Mother        Breast Cancer  . Breast cancer Mother 35  . Cancer Father        Colon Cancer  . Heart disease Father        Hx of MI  . Hypertension Father   . Diabetes Father   . Cancer Maternal Grandmother        lung cancer  . Cancer Maternal Uncle        esophageal    Past Surgical History:  Procedure Laterality Date  . CARPAL TUNNEL RELEASE Right   . CESAREAN SECTION  1996  . COLONOSCOPY  03/14/2002, 08/16/2007, 03/07/2013   [facility]-father  . COLONOSCOPY WITH PROPOFOL N/A 06/21/2018   Procedure: COLONOSCOPY WITH PROPOFOL;  Surgeon: Manya Silvas, MD;  Location: [facility] ENDOSCOPY;  Service: Endoscopy;  Laterality: N/A;  . CRANIOTOMY Left 06/25/2017   Procedure: CRANIOTOMY TEMPORAL LEFT FOR TUMOR RESECTION;  Surgeon: Ashok Pall, MD;  Location: [place];  Service: Neurosurgery;  Laterality: Left;  . ESOPHAGOGASTRODUODENOSCOPY  03/14/2002, 03/07/2013  . ESOPHAGOGASTRODUODENOSCOPY (EGD) WITH PROPOFOL N/A 06/21/2018   Procedure: ESOPHAGOGASTRODUODENOSCOPY (EGD) WITH PROPOFOL;  Surgeon: Manya Silvas, MD;  Location: [facility] ENDOSCOPY;  Service: Endoscopy;  Laterality: N/A;  . ESOPHAGOGASTRODUODENOSCOPY (EGD) WITH PROPOFOL N/A 07/15/2019   Procedure: ESOPHAGOGASTRODUODENOSCOPY (EGD) WITH PROPOFOL;  Surgeon: Jonathon Bellows, MD;  Location: [facility] ENDOSCOPY;  Service: Gastroenterology;  Laterality: N/A;  . Post Septoplasty and turbinate reduction  2007  . TRIGGER FINGER RELEASE     Social History   Occupational History  . Not on file  Tobacco Use  . Smoking status: Never Smoker  . Smokeless tobacco: Never Used  Vaping Use  . Vaping Use: Never used  Substance and Sexual Activity  . Alcohol use: Yes    Alcohol/week: 0.0 standard drinks    Comment: occasionally  . Drug use: No  . Sexual activity: Not on file

## 2020-01-16 ENCOUNTER — Encounter: Payer: Self-pay | Admitting: Family Medicine

## 2020-01-16 ENCOUNTER — Other Ambulatory Visit: Payer: Self-pay

## 2020-01-16 ENCOUNTER — Ambulatory Visit (INDEPENDENT_AMBULATORY_CARE_PROVIDER_SITE_OTHER): Payer: 59 | Admitting: Family Medicine

## 2020-01-16 ENCOUNTER — Ambulatory Visit: Payer: Self-pay

## 2020-01-16 DIAGNOSIS — M1611 Unilateral primary osteoarthritis, right hip: Secondary | ICD-10-CM

## 2020-01-16 NOTE — Progress Notes (Signed)
ultr

## 2020-01-16 NOTE — Progress Notes (Signed)
Subjective: Patient is here for ultrasound-guided intra-articular right hip injection.   Good relief from injection 2 years ago.  Objective:  Pain with passive IR.  Procedure: Ultrasound-guided right hip injection: After sterile prep with Betadine, injected 8 cc 1% lidocaine without epinephrine and 40 mg methylprednisolone using a 22-gauge spinal needle, passing the needle through the iliofemoral ligament into the femoral head/neck junction.  Injectate seen filling joint capsule.  Return as directed.

## 2020-01-18 ENCOUNTER — Ambulatory Visit: Payer: 59 | Admitting: Primary Care

## 2020-02-01 ENCOUNTER — Other Ambulatory Visit: Payer: Self-pay

## 2020-02-01 ENCOUNTER — Other Ambulatory Visit (INDEPENDENT_AMBULATORY_CARE_PROVIDER_SITE_OTHER): Payer: 59

## 2020-02-01 DIAGNOSIS — I10 Essential (primary) hypertension: Secondary | ICD-10-CM

## 2020-02-01 DIAGNOSIS — E7800 Pure hypercholesterolemia, unspecified: Secondary | ICD-10-CM | POA: Diagnosis not present

## 2020-02-01 DIAGNOSIS — D649 Anemia, unspecified: Secondary | ICD-10-CM

## 2020-02-01 LAB — CBC WITH DIFFERENTIAL/PLATELET
Basophils Absolute: 0.1 10*3/uL (ref 0.0–0.1)
Basophils Relative: 2.4 % (ref 0.0–3.0)
Eosinophils Absolute: 0.2 10*3/uL (ref 0.0–0.7)
Eosinophils Relative: 4.9 % (ref 0.0–5.0)
HCT: 36.8 % (ref 36.0–46.0)
Hemoglobin: 12.3 g/dL (ref 12.0–15.0)
Lymphocytes Relative: 32.5 % (ref 12.0–46.0)
Lymphs Abs: 1.1 10*3/uL (ref 0.7–4.0)
MCHC: 33.4 g/dL (ref 30.0–36.0)
MCV: 99.5 fl (ref 78.0–100.0)
Monocytes Absolute: 0.6 10*3/uL (ref 0.1–1.0)
Monocytes Relative: 17.8 % — ABNORMAL HIGH (ref 3.0–12.0)
Neutro Abs: 1.4 10*3/uL (ref 1.4–7.7)
Neutrophils Relative %: 42.4 % — ABNORMAL LOW (ref 43.0–77.0)
Platelets: 269 10*3/uL (ref 150.0–400.0)
RBC: 3.7 Mil/uL — ABNORMAL LOW (ref 3.87–5.11)
RDW: 14.2 % (ref 11.5–15.5)
WBC: 3.4 10*3/uL — ABNORMAL LOW (ref 4.0–10.5)

## 2020-02-01 LAB — BASIC METABOLIC PANEL
BUN: 11 mg/dL (ref 6–23)
CO2: 25 mEq/L (ref 19–32)
Calcium: 9.5 mg/dL (ref 8.4–10.5)
Chloride: 108 mEq/L (ref 96–112)
Creatinine, Ser: 0.95 mg/dL (ref 0.40–1.20)
GFR: 75.08 mL/min (ref >60.00)
Glucose, Bld: 104 mg/dL — ABNORMAL HIGH (ref 70–99)
Potassium: 4.4 mEq/L (ref 3.5–5.1)
Sodium: 138 mEq/L (ref 135–145)

## 2020-02-01 LAB — COMPREHENSIVE METABOLIC PANEL
ALT: 25 U/L (ref 0–35)
AST: 23 U/L (ref 0–37)
Albumin: 3.9 g/dL (ref 3.5–5.2)
Alkaline Phosphatase: 57 U/L (ref 39–117)
BUN: 11 mg/dL (ref 6–23)
CO2: 25 mEq/L (ref 19–32)
Calcium: 9.5 mg/dL (ref 8.4–10.5)
Chloride: 108 mEq/L (ref 96–112)
Creatinine, Ser: 0.95 mg/dL (ref 0.40–1.20)
GFR: 75.08 mL/min (ref >60.00)
Glucose, Bld: 104 mg/dL — ABNORMAL HIGH (ref 70–99)
Potassium: 4.4 mEq/L (ref 3.5–5.1)
Sodium: 138 mEq/L (ref 135–145)
Total Bilirubin: 0.6 mg/dL (ref 0.2–1.2)
Total Protein: 6.5 g/dL (ref 6.0–8.3)

## 2020-02-01 LAB — HEPATIC FUNCTION PANEL
ALT: 25 U/L (ref 0–35)
AST: 23 U/L (ref 0–37)
Albumin: 3.9 g/dL (ref 3.5–5.2)
Alkaline Phosphatase: 57 U/L (ref 39–117)
Bilirubin, Direct: 0.1 mg/dL (ref 0.0–0.3)
Total Bilirubin: 0.6 mg/dL (ref 0.2–1.2)
Total Protein: 6.5 g/dL (ref 6.0–8.3)

## 2020-02-01 LAB — LIPID PANEL
Cholesterol: 242 mg/dL — ABNORMAL HIGH (ref 0–200)
HDL: 50.7 mg/dL (ref >39.00)
LDL Cholesterol: 168 mg/dL — ABNORMAL HIGH (ref 0–99)
NonHDL: 191.44
Total CHOL/HDL Ratio: 5
Triglycerides: 119 mg/dL (ref 0.0–149.0)
VLDL: 23.8 mg/dL (ref 0.0–40.0)

## 2020-02-02 ENCOUNTER — Other Ambulatory Visit: Payer: 59

## 2020-02-06 ENCOUNTER — Ambulatory Visit (INDEPENDENT_AMBULATORY_CARE_PROVIDER_SITE_OTHER): Payer: 59 | Admitting: Internal Medicine

## 2020-02-06 ENCOUNTER — Other Ambulatory Visit: Payer: Self-pay

## 2020-02-06 ENCOUNTER — Encounter: Payer: Self-pay | Admitting: Internal Medicine

## 2020-02-06 VITALS — BP 110/76 | HR 89 | Temp 98.7°F | Resp 15 | Ht 62.0 in | Wt 184.8 lb

## 2020-02-06 DIAGNOSIS — I10 Essential (primary) hypertension: Secondary | ICD-10-CM

## 2020-02-06 DIAGNOSIS — Z9109 Other allergy status, other than to drugs and biological substances: Secondary | ICD-10-CM

## 2020-02-06 DIAGNOSIS — F419 Anxiety disorder, unspecified: Secondary | ICD-10-CM

## 2020-02-06 DIAGNOSIS — E559 Vitamin D deficiency, unspecified: Secondary | ICD-10-CM | POA: Diagnosis not present

## 2020-02-06 DIAGNOSIS — I872 Venous insufficiency (chronic) (peripheral): Secondary | ICD-10-CM

## 2020-02-06 DIAGNOSIS — D72819 Decreased white blood cell count, unspecified: Secondary | ICD-10-CM

## 2020-02-06 DIAGNOSIS — R739 Hyperglycemia, unspecified: Secondary | ICD-10-CM

## 2020-02-06 DIAGNOSIS — Z800 Family history of malignant neoplasm of digestive organs: Secondary | ICD-10-CM

## 2020-02-06 DIAGNOSIS — E7800 Pure hypercholesterolemia, unspecified: Secondary | ICD-10-CM

## 2020-02-06 DIAGNOSIS — E039 Hypothyroidism, unspecified: Secondary | ICD-10-CM

## 2020-02-06 DIAGNOSIS — D329 Benign neoplasm of meninges, unspecified: Secondary | ICD-10-CM

## 2020-02-06 DIAGNOSIS — G4730 Sleep apnea, unspecified: Secondary | ICD-10-CM

## 2020-02-06 DIAGNOSIS — E041 Nontoxic single thyroid nodule: Secondary | ICD-10-CM

## 2020-02-06 DIAGNOSIS — M059 Rheumatoid arthritis with rheumatoid factor, unspecified: Secondary | ICD-10-CM

## 2020-02-06 MED ORDER — HYDROCHLOROTHIAZIDE 12.5 MG PO TABS: 12.5000 mg | ORAL_TABLET | Freq: Every day | ORAL | 1 refills | Status: DC

## 2020-02-06 MED ORDER — DEXILANT 30 MG PO CPDR: 30.0000 mg | DELAYED_RELEASE_CAPSULE | Freq: Every day | ORAL | 1 refills | Status: DC

## 2020-02-06 NOTE — Progress Notes (Signed)
Patient ID: Tina Parker, female   DOB: 06/16/1971, 49 y.o.   MRN: [id number]   Subjective:    Patient ID: Tina Parker, female    DOB: Jul 01, 1970, 49 y.o.   MRN: [id number]  HPI This visit occurred during the SARS-CoV-2 public health emergency.  Safety protocols were in place, including screening questions prior to the visit, additional usage of staff PPE, and extensive cleaning of exam room while observing appropriate contact time as indicated for disinfecting solutions.  Patient here for a scheduled follow up.  She has been having increased pain in her right hip.  Saw ortho.  S/p ultrasound guided right hip injection.  Helped some.  Still with increased joint pains.  Is followed by rheumatology for RA.  Tries to stay active.  Working.  Evaluated by [facility] for chronic venous insufficiency.  Compression hose.  No increased swelling.  No chest pain.  Sees pulmonary.  Breathing stable.  No increased cough or congestion.  Also followed by Dr Remus Blake - allergy.  On Qvar, singulair, xyzal and flonase.  Also taking PPI to help control acid reflux.  Due to f/u with Dr Vicente Males in 02/2020.  No abdominal pain.  Bowels moving.  Has been evaluated - thyroid nodule.  S/p FNA left thyroid nodule 07/11/18 - benign.  Recommended continued f/u.  She request to see an endocrinologist in [place].    Past Medical History:  Diagnosis Date  . Allergic rhinitis   . Anemia   . Arthritis    RA  . Carpal tunnel syndrome   . Dysphagia   . Esophageal spasm 01/16/2014  . GERD (gastroesophageal reflux disease)   . Graves disease    remission, no ablation, positive medical treatment  . History of hiatal hernia   . Hypertension   . Migraine headache    migraines  . 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 Pure hypercholesterolemia   . Rheumatoid arthritis(714.0)    positive anti CCP antibodies, positive RF, oligo-articular, MTX  . Sciatica    Past Surgical History:  Procedure Laterality Date  . CARPAL TUNNEL  RELEASE Right   . CESAREAN SECTION  1996  . COLONOSCOPY  03/14/2002, 08/16/2007, 03/07/2013   [facility]-father  . COLONOSCOPY WITH PROPOFOL N/A 06/21/2018   Procedure: COLONOSCOPY WITH PROPOFOL;  Surgeon: Manya Silvas, MD;  Location: [facility] ENDOSCOPY;  Service: Endoscopy;  Laterality: N/A;  . CRANIOTOMY Left 06/25/2017   Procedure: CRANIOTOMY TEMPORAL LEFT FOR TUMOR RESECTION;  Surgeon: Ashok Pall, MD;  Location: [place];  Service: Neurosurgery;  Laterality: Left;  . ESOPHAGOGASTRODUODENOSCOPY  03/14/2002, 03/07/2013  . ESOPHAGOGASTRODUODENOSCOPY (EGD) WITH PROPOFOL N/A 06/21/2018   Procedure: ESOPHAGOGASTRODUODENOSCOPY (EGD) WITH PROPOFOL;  Surgeon: Manya Silvas, MD;  Location: [facility] ENDOSCOPY;  Service: Endoscopy;  Laterality: N/A;  . ESOPHAGOGASTRODUODENOSCOPY (EGD) WITH PROPOFOL N/A 07/15/2019   Procedure: ESOPHAGOGASTRODUODENOSCOPY (EGD) WITH PROPOFOL;  Surgeon: Jonathon Bellows, MD;  Location: [facility] ENDOSCOPY;  Service: Gastroenterology;  Laterality: N/A;  . Post Septoplasty and turbinate reduction  2007  . TRIGGER FINGER RELEASE     Family History  Problem Relation Age of Onset  . Cancer Mother        Breast Cancer  . Breast cancer Mother 18  . Cancer Father        Colon Cancer  . Heart disease Father        Hx of MI  . Hypertension Father   . Diabetes Father   . Cancer Maternal Grandmother  lung cancer  . Cancer Maternal Uncle        esophageal   Social History   Socioeconomic History  . Marital status: Married    Spouse name: Not on file  . Number of children: Not on file  . Years of education: Not on file  . Highest education level: Not on file  Occupational History  . Not on file  Tobacco Use  . Smoking status: Never Smoker  . Smokeless tobacco: Never Used  Vaping Use  . Vaping Use: Never used  Substance and Sexual Activity  . Alcohol use: Yes    Alcohol/week: 0.0 standard drinks    Comment: occasionally  . Drug use: No  . Sexual activity: Not on file  Other  Topics Concern  . Not on file  Social History Narrative   Hairdresser    Social Determinants of Health   Financial Resource Strain:   . Difficulty of Paying Living Expenses:   Food Insecurity:   . Worried About Charity fundraiser in the Last Year:   . Arboriculturist in the Last Year:   Transportation Needs:   . Film/video editor (Medical):   Marland Kitchen Lack of Transportation (Non-Medical):   Physical Activity:   . Days of Exercise per Week:   . Minutes of Exercise per Session:   Stress:   . Feeling of Stress :   Social Connections:   . Frequency of Communication with Friends and Family:   . Frequency of Social Gatherings with Friends and Family:   . Attends Religious Services:   . Active Member of Clubs or Organizations:   . Attends Archivist Meetings:   Marland Kitchen Marital Status:     Outpatient Encounter Medications as of 02/06/2020  Medication Sig  . albuterol (PROVENTIL HFA;VENTOLIN HFA) 108 (90 BASE) MCG/ACT inhaler Inhale 2 puffs into the lungs every 6 (six) hours as needed for wheezing or shortness of breath.  Marland Kitchen azelastine (ASTELIN) 0.1 % nasal spray Place 2 sprays into both nostrils 2 (two) times daily. Use in each nostril as directed  . citalopram (CELEXA) 40 MG tablet Take 1 tablet (40 mg total) by mouth daily.  Marland Kitchen Dexlansoprazole (DEXILANT) 30 MG capsule Take 1 capsule (30 mg total) by mouth daily.  . folic acid (FOLVITE) 1 MG tablet   . hydrochlorothiazide (HYDRODIURIL) 12.5 MG tablet Take 1 tablet (12.5 mg total) by mouth daily.  Marland Kitchen levocetirizine (XYZAL) 5 MG tablet Take 5 mg by mouth at bedtime.  Marland Kitchen losartan (COZAAR) 100 MG tablet TAKE 1 TABLET(100 MG) BY MOUTH DAILY  . medroxyPROGESTERone (DEPO-PROVERA) 150 MG/ML injection Inject 150 mg into the muscle every 3 (three) months.   . methotrexate (RHEUMATREX) 2.5 MG tablet Take 22.5 mg by mouth every Monday. Caution:Chemotherapy. Protect from light. Take 9 tabs po weekly with meals  . montelukast (SINGULAIR) 10 MG  tablet take 1 tablet by mouth at bedtime  . QVAR REDIHALER 80 MCG/ACT inhaler SMARTSIG:2 Puff(s) By Mouth Twice Daily  . Tocilizumab (ACTEMRA) 162 MG/0.9ML SOSY Inject into the skin.  . valACYclovir (VALTREX) 500 MG tablet Take one tablet bid x 3 days.  May repeat for flares as directed.  . [DISCONTINUED] Dexlansoprazole (DEXILANT) 30 MG capsule Take 1 capsule (30 mg total) by mouth daily.  . [DISCONTINUED] hydrochlorothiazide (HYDRODIURIL) 12.5 MG tablet Take 1 tablet (12.5 mg total) by mouth daily.  . benzonatate (TESSALON) 100 MG capsule Take 1 capsule (100 mg total) by mouth 3 (three) times daily as  needed for cough. (Patient not taking: Reported on 02/06/2020)  . [DISCONTINUED] cyclobenzaprine (FLEXERIL) 10 MG tablet Take 1 tablet by mouth at bedtime as needed. (Patient not taking: Reported on 02/06/2020)  . [DISCONTINUED] methocarbamol (ROBAXIN) 750 MG tablet methocarbamol 750 mg tablet (Patient not taking: Reported on 02/06/2020)   Facility-Administered Encounter Medications as of 02/06/2020  Medication  . betamethasone acetate-betamethasone sodium phosphate (CELESTONE) injection 12 mg    Review of Systems  Constitutional: Negative for appetite change and unexpected weight change.  HENT: Negative for congestion and sinus pressure.   Respiratory: Negative for cough, chest tightness and shortness of breath.   Cardiovascular: Negative for chest pain, palpitations and leg swelling.  Gastrointestinal: Negative for abdominal pain, diarrhea, nausea and vomiting.  Genitourinary: Negative for difficulty urinating and dysuria.  Musculoskeletal:       Hip pain as outlined.  S/p injection.  Joint stiffness - followed by Dr Jefm Bryant.   Skin: Negative for color change and rash.  Neurological: Negative for dizziness, light-headedness and headaches.  Psychiatric/Behavioral: Negative for agitation and dysphoric mood.       Objective:    Physical Exam Vitals reviewed.  Constitutional:      General:  She is not in acute distress.    Appearance: Normal appearance.  HENT:     Head: Normocephalic and atraumatic.     Right Ear: External ear normal.     Left Ear: External ear normal.  Eyes:     General: No scleral icterus.       Right eye: No discharge.        Left eye: No discharge.     Conjunctiva/sclera: Conjunctivae normal.  Neck:     Thyroid: No thyromegaly.  Cardiovascular:     Rate and Rhythm: Normal rate and regular rhythm.  Pulmonary:     Effort: No respiratory distress.     Breath sounds: Normal breath sounds. No wheezing.  Abdominal:     General: Bowel sounds are normal.     Palpations: Abdomen is soft.     Tenderness: There is no abdominal tenderness.  Musculoskeletal:        General: No swelling or tenderness.     Cervical back: Neck supple. No tenderness.  Lymphadenopathy:     Cervical: No cervical adenopathy.  Skin:    Findings: No erythema or rash.  Neurological:     Mental Status: She is alert.  Psychiatric:        Mood and Affect: Mood normal.        Behavior: Behavior normal.     BP 110/76 (BP Location: Left Arm, Patient Position: Sitting, Cuff Size: Large)   Pulse 89   Temp 98.7 F (37.1 C) (Oral)   Resp 15   Ht 5\' 2"  (1.575 m)   Wt 184 lb 12.8 oz (83.8 kg)   SpO2 98%   BMI 33.80 kg/m  Wt Readings from Last 3 Encounters:  02/06/20 184 lb 12.8 oz (83.8 kg)  01/10/20 187 lb (84.8 kg)  12/26/19 186 lb (84.4 kg)     Lab Results  Component Value Date   WBC 3.4 (L) 02/01/2020   HGB 12.3 02/01/2020   HCT 36.8 02/01/2020   PLT 269.0 02/01/2020   GLUCOSE 104 (H) 02/01/2020   GLUCOSE 104 (H) 02/01/2020   CHOL 242 (H) 02/01/2020   TRIG 119.0 02/01/2020   HDL 50.70 02/01/2020   LDLCALC 168 (H) 02/01/2020   ALT 25 02/01/2020   ALT 25 02/01/2020   AST 23 02/01/2020  AST 23 02/01/2020   NA 138 02/01/2020   NA 138 02/01/2020   K 4.4 02/01/2020   K 4.4 02/01/2020   CL 108 02/01/2020   CL 108 02/01/2020   CREATININE 0.95 02/01/2020    CREATININE 0.95 02/01/2020   BUN 11 02/01/2020   BUN 11 02/01/2020   CO2 25 02/01/2020   CO2 25 02/01/2020   TSH 1.97 04/25/2019    DG Tibia/Fibula Left  Result Date: 08/30/2019 CLINICAL DATA:  Leg pain and swelling lower leg. EXAM: LEFT TIBIA AND FIBULA - 2 VIEW COMPARISON:  None. FINDINGS: There are edematous soft tissue changes most pronounced over the lateral leg. No acute bony abnormality. Specifically, no fracture, subluxation, or dislocation. Alignment at the knee and ankle is grossly preserved on these nondedicated radiographs. Bidirectional calcaneal spurs are noted incidentally. A small patellar enthesophyte is noted superiorly as well. IMPRESSION: Edematous soft tissue changes most pronounced over the lateral leg. No acute bony abnormality. Electronically Signed   By: Lovena Le M.D.   On: 08/30/2019 20:31   US Venous Img Lower Unilateral Left  Result Date: 08/30/2019 CLINICAL DATA:  Leg swelling EXAM: LEFT LOWER EXTREMITY VENOUS DOPPLER ULTRASOUND TECHNIQUE: Gray-scale sonography with compression, as well as color and duplex ultrasound, were performed to evaluate the deep venous system(s) from the level of the common femoral vein through the popliteal and proximal calf veins. COMPARISON:  None. FINDINGS: VENOUS Normal compressibility of the common femoral, superficial femoral, and popliteal veins, as well as the visualized calf veins. Visualized portions of profunda femoral vein and great saphenous vein unremarkable. No filling defects to suggest DVT on grayscale or color Doppler imaging. Doppler waveforms show normal direction of venous flow, normal respiratory phasicity and response to augmentation. Limited views of the contralateral common femoral vein are unremarkable. OTHER None. Limitations: none IMPRESSION: No femoropopliteal DVT nor evidence of DVT within the visualized calf veins. If clinical symptoms are inconsistent or if there are persistent or worsening symptoms, further  imaging (possibly involving the iliac veins) may be warranted. Electronically Signed   By: Lovena Le M.D.   On: 08/30/2019 19:52       Assessment & Plan:   Problem List Items Addressed This Visit    Vitamin D deficiency    Follow vitamin D level.       Thyroid nodule    Has been evaluated by [facility] endocrinology.  S/p FNA - Dr Gabriel Carina 07/11/18 - benign.  Reports noticing some increased fullness.  Can feel when swallowing.  Request f/u with endocrinology in [place].        Relevant Orders   Ambulatory referral to Endocrinology   Rheumatoid arthritis ([place])    Followed by Dr Jefm Bryant.  On MTX.  Stable.       Mild sleep apnea    CPAP.       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        Hypothyroidism    On thyroid replacement.  Follow tsh.       Hypercholesterolemia    The 10-year ASCVD risk score Mikey Bussing DC Jr., et al., 2013) is: 2.5%   Values used to calculate the score:     Age: 45 years     Sex: Female     Is Non-Hispanic African American: Yes     Diabetic: No     Tobacco smoker: No     Systolic Blood Pressure: 277 mmHg  Is BP treated: Yes     HDL Cholesterol: 50.7 mg/dL     Total Cholesterol: 242 mg/dL  Low cholesterol diet and exercise.  Follow lipid panel.       Relevant Medications   hydrochlorothiazide (HYDRODIURIL) 12.5 MG tablet   Family history of colon cancer    Colonoscopy 05/2018 - internal hemorrhoids.       Essential hypertension, benign    Blood pressure has been under good control.  Continue losartan and hctz.  Follow pressures.  Follow metabolic panel.       Relevant Medications   hydrochlorothiazide (HYDRODIURIL) 12.5 MG tablet   Environmental allergies    Seeing an allergist.  Continue singulair, flonase and xyzal.  Follow.       Chronic venous insufficiency    Compression hose.       Relevant Medications   hydrochlorothiazide (HYDRODIURIL) 12.5 MG tablet    Anxiety    Continue citalopram.        Other Visit Diagnoses    Leukopenia, unspecified type    -  Primary   Relevant Orders   CBC with Differential/Platelet   Essential hypertension       Relevant Medications   hydrochlorothiazide (HYDRODIURIL) 12.5 MG tablet   Hyperglycemia       Relevant Orders   Hemoglobin A1c       Einar Pheasant, MD

## 2020-02-06 NOTE — Assessment & Plan Note (Addendum)
The 10-year ASCVD risk score Mikey Bussing DC Brooke Bonito., et al., 2013) is: 2.5%   Values used to calculate the score:     Age: 49 years     Sex: Female     Is Non-Hispanic African American: Yes     Diabetic: No     Tobacco smoker: No     Systolic Blood Pressure: 553 mmHg     Is BP treated: Yes     HDL Cholesterol: 50.7 mg/dL     Total Cholesterol: 242 mg/dL  Low cholesterol diet and exercise.  Follow lipid panel.

## 2020-02-07 ENCOUNTER — Ambulatory Visit: Payer: 59

## 2020-02-09 ENCOUNTER — Ambulatory Visit (INDEPENDENT_AMBULATORY_CARE_PROVIDER_SITE_OTHER): Payer: 59

## 2020-02-09 ENCOUNTER — Other Ambulatory Visit: Payer: Self-pay

## 2020-02-09 DIAGNOSIS — Z3042 Encounter for surveillance of injectable contraceptive: Secondary | ICD-10-CM | POA: Diagnosis not present

## 2020-02-09 LAB — POCT URINE PREGNANCY: Preg Test, Ur: NEGATIVE

## 2020-02-09 MED ORDER — MEDROXYPROGESTERONE ACETATE 150 MG/ML IM SUSP
150.0000 mg | Freq: Once | INTRAMUSCULAR | Status: AC
  Administered 2020-02-09: 150 mg via INTRAMUSCULAR

## 2020-02-09 NOTE — Progress Notes (Signed)
Patient presented for Depo-Provera injection to left/right deltoid/glute, patient voiced no concerns nor showed any signs of distress during injection. Next injection due :Oct 28-Nov 11.

## 2020-02-12 ENCOUNTER — Encounter: Payer: Self-pay | Admitting: Internal Medicine

## 2020-02-12 NOTE — Assessment & Plan Note (Signed)
Compression hose.   

## 2020-02-12 NOTE — Assessment & Plan Note (Signed)
Seeing an allergist.  Continue singulair, flonase and xyzal.  Follow.

## 2020-02-12 NOTE — Assessment & Plan Note (Signed)
Colonoscopy 05/2018 - internal hemorrhoids.  

## 2020-02-12 NOTE — Assessment & Plan Note (Signed)
Blood pressure has been under good control.  Continue losartan and hctz.  Follow pressures.  Follow metabolic panel.

## 2020-02-12 NOTE — Assessment & Plan Note (Signed)
-

## 2020-02-12 NOTE — Assessment & Plan Note (Signed)
Followed by Dr Jefm Bryant.  On MTX.  Stable.

## 2020-02-12 NOTE — Assessment & Plan Note (Signed)
Followed by Dr Christella Noa - neurosurgery.

## 2020-02-12 NOTE — Assessment & Plan Note (Signed)
CPAP.  

## 2020-02-12 NOTE — Assessment & Plan Note (Signed)
Follow vitamin D level.  

## 2020-02-12 NOTE — Assessment & Plan Note (Signed)
Has been evaluated by [facility] endocrinology.  S/p FNA - Dr Gabriel Carina 07/11/18 - benign.  Reports noticing some increased fullness.  Can feel when swallowing.  Request f/u with endocrinology in [place].

## 2020-02-12 NOTE — Assessment & Plan Note (Signed)
On thyroid replacement.  Follow tsh.  

## 2020-02-19 NOTE — Patient Instructions (Addendum)
Nice seeing you today Ms [facility]  Pulmonary function testing today were normal  Recommendations: -Stop Qvar -Start low dose ICS/LABA two puffs twice daily (rinse mouth after use) -Discuss either increasing Dexilant or adding something in evening such as famotidine for Gerd symptoms likely contributing to cough  Follow-up: 3 months with Dr. Carlis Abbott

## 2020-02-19 NOTE — Progress Notes (Signed)
@Patient  ID: Tina Parker, female    DOB: 1971-04-01, 49 y.o.   MRN: [id number]  Chief Complaint  Patient presents with  . Follow-up    PFT performed today.  Pt states the nasal spray that was provided has helped some but pt states she is still coughing  alot at night.     Referring provider: Einar Pheasant, MD  HPI: 49 year old female. PMH significant for mild sleep apnea, allergic rhinitis, sinusitis, HTN, dysphagia, hypothyroidism. Patient is followed by Dr. Halford Chessman for sleep apanea and recent saw Dr. Carlis Abbott for cough.   Previous [facility] pulmonary encounter: 12/07/19- Dr. Carlis Abbott, consult - cough  Tina Parker is a 49 year old woman who presents for evaluation of cough.  She has previously been evaluated by Dr. Halford Chessman for obstructive sleep apnea.  She has had a chronic cough intermittently for years.  Her cough is dry.  She has shortness of breath only with coughing episodes, never with exertion or at rest.  She has recurrent episodes of bronchitis.  She had an episode of severe bronchitis in December 2019 and has had more recurrent coughing since then.  At that time her cough is productive, but has remained dry.  Her cough improves with using albuterol, cough drops, drinking water, and taking Tessalon Perles.  She has more symptoms at night.  She follows with Dr. Remus Blake in allergy and immunology, who was prescribed Qvar, rescue inhaler, Singulair, Xyzal, Flonase.  She was recently seen in urgent care on 11/28/2019 in [place] for an acute infection with fevers, throat irritation, myalgias, headache.  Her dog was also sick at the same time.  She was given doxycycline, sinus rinses, Mucinex with resolution of her symptoms.  At that time Covid and flu tests were negative.  Today she denies fever, chills, sweats, weight loss, change in appetite, chest pain, leg edema.  She has postnasal drip and uncontrolled GERD.  She has previously had side effects with many acid suppressing medications, but is doing  well with esomeprazole.  She has breakthrough symptoms about 2 days/week.  She does not think her cough is always attributed to this because she knows when her reflux is worse because she can feel the acid in her throat.  No previous history of tobacco use or vaping.  Sometimes at night her cough is bothersome enough that she has to discontinue using her CPAP.  Past medical history is notable for rheumatoid arthritis for which she takes methotrexate plus folic acid and Tocilizumab per rheumatologist Dr. Jefm Bryant [facility] in [place]).  She started immunomodulating medications in 2013.  At that time she had a chest x-ray which demonstrated scarring.  This was performed due to history of positive PPD.  She has never tried treatment for latent TB.  No follow-up imaging has been done since developing her cough.  02/20/2020 Patient presents today for 2 month follow-up with PFTs. Cough felt to be related to uncontrolled GERD or allergic rhinosinusitis. Concern possible underlying RA-associated ILD. CXR showed some mild coarsened interstitium and bronchitic features which could reflect bronchitis or reactive airway disease.   Still coughing at night. Nasal spray has helped some.  Albuterol also helps. She is on dexilant once daily, she will occasionally need to take something additional in the evening to help with reflux symptoms. Seeing GI doctor is a couple of weeks. May be having hernia repair  PFTs 02/20/2020 FVC 2.29 (93%), FEV1 2.01 (101%), ratio 88, TLC 85%, DLCOcor 15.94 (87%)  Imaging: 12/07/19 CXR- some  mild coarsened interstitium and bronchitis features. Could reflect bronchitis or reactive airway disease    Allergies  Allergen Reactions  . Inh [Isoniazid] Other (See Comments)    Causes hepatitis    Immunization History  Administered Date(s) Administered  . Influenza,inj,Quad PF,6+ Mos 04/11/2013, 03/14/2014, 02/27/2015, 03/17/2016, 03/16/2017, 05/05/2018, 03/25/2019  . PFIZER  SARS-COV-2 Vaccination 09/04/2019, 09/27/2019  . Zoster Recombinat (Shingrix) 04/18/2019    Past Medical History:  Diagnosis Date  . Allergic rhinitis   . Anemia   . Arthritis    RA  . Carpal tunnel syndrome   . Dysphagia   . Esophageal spasm 01/16/2014  . GERD (gastroesophageal reflux disease)   . Graves disease    remission, no ablation, positive medical treatment  . History of hiatal hernia   . Hypertension   . Migraine headache    migraines  . PPD positive    hepatitis secondary to [place]  . Pure hypercholesterolemia   . Rheumatoid arthritis(714.0)    positive anti CCP antibodies, positive RF, oligo-articular, MTX  . Sciatica     Tobacco History: Social History   Tobacco Use  Smoking Status Never Smoker  Smokeless Tobacco Never Used   Counseling given: Not Answered   Outpatient Medications Prior to Visit  Medication Sig Dispense Refill  . albuterol (PROVENTIL HFA;VENTOLIN HFA) 108 (90 BASE) MCG/ACT inhaler Inhale 2 puffs into the lungs every 6 (six) hours as needed for wheezing or shortness of breath. 1 Inhaler 2  . azelastine (ASTELIN) 0.1 % nasal spray Place 2 sprays into both nostrils 2 (two) times daily. Use in each nostril as directed 30 mL 12  . benzonatate (TESSALON) 100 MG capsule Take 1 capsule (100 mg total) by mouth 3 (three) times daily as needed for cough. 21 capsule 0  . citalopram (CELEXA) 40 MG tablet Take 1 tablet (40 mg total) by mouth daily. 30 tablet 3  . Dexlansoprazole (DEXILANT) 30 MG capsule Take 1 capsule (30 mg total) by mouth daily. 90 capsule 1  . folic acid (FOLVITE) 1 MG tablet     . hydrochlorothiazide (HYDRODIURIL) 12.5 MG tablet Take 1 tablet (12.5 mg total) by mouth daily. 90 tablet 1  . levocetirizine (XYZAL) 5 MG tablet Take 5 mg by mouth at bedtime.    Marland Kitchen losartan (COZAAR) 100 MG tablet TAKE 1 TABLET(100 MG) BY MOUTH DAILY 30 tablet 2  . medroxyPROGESTERone (DEPO-PROVERA) 150 MG/ML injection Inject 150 mg into the muscle every 3  (three) months.     . methotrexate (RHEUMATREX) 2.5 MG tablet Take 22.5 mg by mouth every Monday. Caution:Chemotherapy. Protect from light. Take 9 tabs po weekly with meals    . montelukast (SINGULAIR) 10 MG tablet take 1 tablet by mouth at bedtime 30 tablet 3  . QVAR REDIHALER 80 MCG/ACT inhaler SMARTSIG:2 Puff(s) By Mouth Twice Daily    . Tocilizumab (ACTEMRA) 162 MG/0.9ML SOSY Inject into the skin.    . valACYclovir (VALTREX) 500 MG tablet Take one tablet bid x 3 days.  May repeat for flares as directed. 30 tablet 0   Facility-Administered Medications Prior to Visit  Medication Dose Route Frequency Provider Last Rate Last Admin  . betamethasone acetate-betamethasone sodium phosphate (CELESTONE) injection 12 mg  12 mg Other Once Magnus Sinning, MD        Review of Systems  Review of Systems  Constitutional: Negative.   Respiratory: Positive for cough. Negative for chest tightness, shortness of breath and wheezing.     Physical Exam  BP 134/88 (BP  Location: Left Arm, Cuff Size: Large)   Pulse 80   Ht 5' (1.524 m)   Wt 189 lb (85.7 kg)   SpO2 100%   BMI 36.91 kg/m  Physical Exam Constitutional:      Appearance: Normal appearance.  HENT:     Mouth/Throat:     Mouth: Mucous membranes are moist.     Pharynx: Oropharynx is clear.  Cardiovascular:     Rate and Rhythm: Normal rate and regular rhythm.  Pulmonary:     Effort: Pulmonary effort is normal.     Breath sounds: Normal breath sounds. No wheezing or rhonchi.  Musculoskeletal:        General: Normal range of motion.  Skin:    General: Skin is warm and dry.  Neurological:     General: No focal deficit present.     Mental Status: She is alert and oriented to person, place, and time. Mental status is at baseline.  Psychiatric:        Mood and Affect: Mood normal.        Behavior: Behavior normal.        Thought Content: Thought content normal.        Judgment: Judgment normal.      Lab Results:  CBC      Component Value Date/Time   WBC 3.4 (L) 02/01/2020 0856   RBC 3.70 (L) 02/01/2020 0856   HGB 12.3 02/01/2020 0856   HCT 36.8 02/01/2020 0856   PLT 269.0 02/01/2020 0856   MCV 99.5 02/01/2020 0856   MCH 30.8 06/22/2017 0816   MCHC 33.4 02/01/2020 0856   RDW 14.2 02/01/2020 0856   LYMPHSABS 1.1 02/01/2020 0856   MONOABS 0.6 02/01/2020 0856   EOSABS 0.2 02/01/2020 0856   BASOSABS 0.1 02/01/2020 0856    BMET    Component Value Date/Time   NA 138 02/01/2020 0856   NA 138 02/01/2020 0856   K 4.4 02/01/2020 0856   K 4.4 02/01/2020 0856   CL 108 02/01/2020 0856   CL 108 02/01/2020 0856   CO2 25 02/01/2020 0856   CO2 25 02/01/2020 0856   GLUCOSE 104 (H) 02/01/2020 0856   GLUCOSE 104 (H) 02/01/2020 0856   BUN 11 02/01/2020 0856   BUN 11 02/01/2020 0856   BUN 8 08/14/2011 0000   CREATININE 0.95 02/01/2020 0856   CREATININE 0.95 02/01/2020 0856   CALCIUM 9.5 02/01/2020 0856   CALCIUM 9.5 02/01/2020 0856   GFRNONAA >60 06/22/2017 0816   GFRAA >60 06/22/2017 0816    BNP No results found for: BNP  ProBNP No results found for: PROBNP  Imaging: No results found.   Assessment & Plan:   Allergic rhinitis - Continue Montelukast, Xyzal and flonase/ azelastine nasal spray as directed   GERD (gastroesophageal reflux disease) - GERD is likely contributing to nocturnal cough - She is compliant with Dexilant, often needing to take something additional in the evening to help with reflux. Recommend discussing with GI, consider adding famotide at bedtime   Cough - PFTs were normal. Continues to have cough. CXR showed some mild coarsened interstitium and bronchitis features. Could reflect bronchitis or reactive airway disease. I do not suspect underlying fibrosis at this time as her diffusion capacity was normal.  - Trial escalate therapy to low ICS-LAMA - RX Symbicort 80 one-two puffs twice a day. If no improvement can stop.  - FU in 6 months with Dr. Carlis Abbott, if not better with max  GERD treatment and low dose ICS/LABA  consider HRCT      Martyn Ehrich, NP 02/20/2020

## 2020-02-20 ENCOUNTER — Ambulatory Visit (INDEPENDENT_AMBULATORY_CARE_PROVIDER_SITE_OTHER): Payer: 59 | Admitting: Critical Care Medicine

## 2020-02-20 ENCOUNTER — Other Ambulatory Visit: Payer: Self-pay

## 2020-02-20 ENCOUNTER — Ambulatory Visit (INDEPENDENT_AMBULATORY_CARE_PROVIDER_SITE_OTHER): Payer: 59 | Admitting: Primary Care

## 2020-02-20 ENCOUNTER — Encounter: Payer: Self-pay | Admitting: Primary Care

## 2020-02-20 DIAGNOSIS — R059 Cough, unspecified: Secondary | ICD-10-CM

## 2020-02-20 DIAGNOSIS — J309 Allergic rhinitis, unspecified: Secondary | ICD-10-CM | POA: Diagnosis not present

## 2020-02-20 DIAGNOSIS — R05 Cough: Secondary | ICD-10-CM

## 2020-02-20 DIAGNOSIS — D849 Immunodeficiency, unspecified: Secondary | ICD-10-CM

## 2020-02-20 DIAGNOSIS — K219 Gastro-esophageal reflux disease without esophagitis: Secondary | ICD-10-CM

## 2020-02-20 LAB — PULMONARY FUNCTION TEST
DL/VA % pred: 100 %
DL/VA: 4.45 ml/min/mmHg/L
DLCO cor % pred: 87 %
DLCO cor: 15.94 ml/min/mmHg
DLCO unc % pred: 84 %
DLCO unc: 15.37 ml/min/mmHg
FEF 25-75 Post: 2.57 L/sec
FEF 25-75 Pre: 2.89 L/sec
FEF2575-%Change-Post: -11 %
FEF2575-%Pred-Post: 118 %
FEF2575-%Pred-Pre: 132 %
FEV1-%Change-Post: -1 %
FEV1-%Pred-Post: 101 %
FEV1-%Pred-Pre: 103 %
FEV1-Post: 2.01 L
FEV1-Pre: 2.05 L
FEV1FVC-%Change-Post: 1 %
FEV1FVC-%Pred-Pre: 106 %
FEV6-%Change-Post: -4 %
FEV6-%Pred-Post: 93 %
FEV6-%Pred-Pre: 98 %
FEV6-Post: 2.24 L
FEV6-Pre: 2.36 L
FEV6FVC-%Pred-Post: 103 %
FEV6FVC-%Pred-Pre: 103 %
FVC-%Change-Post: -2 %
FVC-%Pred-Post: 93 %
FVC-%Pred-Pre: 95 %
FVC-Post: 2.29 L
FVC-Pre: 2.36 L
Post FEV1/FVC ratio: 88 %
Post FEV6/FVC ratio: 100 %
Pre FEV1/FVC ratio: 87 %
Pre FEV6/FVC Ratio: 100 %
RV % pred: 87 %
RV: 1.4 L
TLC % pred: 85 %
TLC: 3.81 L

## 2020-02-20 MED ORDER — BUDESONIDE-FORMOTEROL FUMARATE 80-4.5 MCG/ACT IN AERO: INHALATION_SPRAY | RESPIRATORY_TRACT | 5 refills | Status: DC

## 2020-02-20 NOTE — Assessment & Plan Note (Addendum)
-   PFTs were normal. Continues to have cough. CXR showed some mild coarsened interstitium and bronchitis features. Could reflect bronchitis or reactive airway disease. I do not suspect underlying fibrosis at this time as her diffusion capacity was normal.  - Trial escalate therapy to low ICS-LAMA - RX Symbicort 80 one-two puffs twice a day. If no improvement can stop.  - FU in 6 months with Dr. Carlis Abbott, if not better with max GERD treatment and low dose ICS/LABA consider HRCT

## 2020-02-20 NOTE — Assessment & Plan Note (Signed)
-   Continue Montelukast, Xyzal and flonase/ azelastine nasal spray as directed

## 2020-02-20 NOTE — Progress Notes (Signed)
Full PFT performed today. °

## 2020-02-20 NOTE — Assessment & Plan Note (Signed)
-   GERD is likely contributing to nocturnal cough - She is compliant with Dexilant, often needing to take something additional in the evening to help with reflux. Recommend discussing with GI, consider adding famotide at bedtime

## 2020-03-01 ENCOUNTER — Ambulatory Visit: Payer: 59 | Admitting: Gastroenterology

## 2020-03-02 ENCOUNTER — Encounter: Payer: Self-pay | Admitting: Internal Medicine

## 2020-03-12 ENCOUNTER — Other Ambulatory Visit (INDEPENDENT_AMBULATORY_CARE_PROVIDER_SITE_OTHER): Payer: 59

## 2020-03-12 ENCOUNTER — Other Ambulatory Visit: Payer: Self-pay

## 2020-03-12 DIAGNOSIS — R739 Hyperglycemia, unspecified: Secondary | ICD-10-CM

## 2020-03-12 DIAGNOSIS — D72819 Decreased white blood cell count, unspecified: Secondary | ICD-10-CM | POA: Diagnosis not present

## 2020-03-12 LAB — CBC WITH DIFFERENTIAL/PLATELET
Basophils Absolute: 0 10*3/uL (ref 0.0–0.1)
Basophils Relative: 1 % (ref 0.0–3.0)
Eosinophils Absolute: 0.1 10*3/uL (ref 0.0–0.7)
Eosinophils Relative: 1.6 % (ref 0.0–5.0)
HCT: 38 % (ref 36.0–46.0)
Hemoglobin: 12.7 g/dL (ref 12.0–15.0)
Lymphocytes Relative: 28.4 % (ref 12.0–46.0)
Lymphs Abs: 1.3 10*3/uL (ref 0.7–4.0)
MCHC: 33.6 g/dL (ref 30.0–36.0)
MCV: 98.9 fl (ref 78.0–100.0)
Monocytes Absolute: 0.5 10*3/uL (ref 0.1–1.0)
Monocytes Relative: 10.7 % (ref 3.0–12.0)
Neutro Abs: 2.6 10*3/uL (ref 1.4–7.7)
Neutrophils Relative %: 58.3 % (ref 43.0–77.0)
Platelets: 262 10*3/uL (ref 150.0–400.0)
RBC: 3.84 Mil/uL — ABNORMAL LOW (ref 3.87–5.11)
RDW: 14.2 % (ref 11.5–15.5)
WBC: 4.5 10*3/uL (ref 4.0–10.5)

## 2020-03-12 LAB — HEMOGLOBIN A1C: Hgb A1c MFr Bld: 5.6 % (ref 4.6–6.5)

## 2020-03-15 ENCOUNTER — Other Ambulatory Visit: Payer: Self-pay | Admitting: Internal Medicine

## 2020-04-06 ENCOUNTER — Encounter: Payer: Self-pay | Admitting: Internal Medicine

## 2020-04-09 ENCOUNTER — Other Ambulatory Visit: Payer: Self-pay

## 2020-04-09 ENCOUNTER — Ambulatory Visit (INDEPENDENT_AMBULATORY_CARE_PROVIDER_SITE_OTHER): Payer: 59 | Admitting: Internal Medicine

## 2020-04-09 ENCOUNTER — Encounter: Payer: Self-pay | Admitting: Internal Medicine

## 2020-04-09 VITALS — BP 130/68 | HR 94 | Ht 62.8 in | Wt 189.8 lb

## 2020-04-09 DIAGNOSIS — E041 Nontoxic single thyroid nodule: Secondary | ICD-10-CM

## 2020-04-09 LAB — T3, FREE: T3, Free: 4.4 pg/mL — ABNORMAL HIGH (ref 2.3–4.2)

## 2020-04-09 LAB — TSH: TSH: 1.59 u[IU]/mL (ref 0.35–4.50)

## 2020-04-09 LAB — T4, FREE: Free T4: 0.63 ng/dL (ref 0.60–1.60)

## 2020-04-09 NOTE — Progress Notes (Addendum)
Patient ID: Tina Parker, female   DOB: [birth year], 49 y.o.   MRN: [id number]   This visit occurred during the SARS-CoV-2 public health emergency.  Safety protocols were in place, including screening questions prior to the visit, additional usage of staff PPE, and extensive cleaning of exam room while observing appropriate contact time as indicated for disinfecting solutions.   HPI  Tina Parker is a 49 y.o.-year-old female, referred by her PCP, Dr. Nicki Reaper, for evaluation for a left thyroid nodule.  At 49 y/o, she had Graves ds. >> started on medication >> after she gave birth to her daughter in [birth year] >> developed hypothyroidism >> started LT4 >> continued for several years >> now off for "years".  Pt's PCP felt a thyroid nodule last year >> sent for a thyroid U/S - this showed a L thyroid nodule.  Thyroid U/S (05/16/2019): Left inferior, isoechoic, 1.7 cm,  thyroid nodule      FNA of this nodule (07/11/2018): Benign  After the Bx, she developed dysphagia >> had EGD >> Es stretched.  Pt has all of the below sxs - chronic: - fullness in neck - feeling nodules in neck - hoarseness - dysphagia - choking - cough -improving  She does have GERD, hiatal hernia.  Of note, she had a normal swallowing study in 2015, which only showed a small hiatal hernia.  I reviewed pt's thyroid tests: Lab Results  Component Value Date   TSH 1.97 04/25/2019   TSH 2.91 10/11/2018   TSH 2.76 12/21/2017   TSH 2.48 01/19/2017   TSH 1.71 03/17/2016   TSH 3.06 03/13/2014   TSH 2.86 04/26/2013   TSH 2.69 09/13/2012   FREET4 0.71 12/21/2017    Pt denies: - palpitations - depression, but does have anxiety - hyperdefecation - weight loss/weight gain - dry skin - hair loss  She has: - fatigue - tremors (longstanding) - constipation - heat intolerance/cold intolerance  No FH of thyroid ds. + FH of thyroid cancer in M first cousin. No h/o radiation tx to head or neck.  No seaweed or kelp.  No recent contrast studies. No steroid use. No herbal supplements. No Biotin supplements or Hair, Skin and Nails vitamins.  Pt also has a history of leukopenia, meningioma - surgically removed, HTN, HL, RA - on Actemra and MTX - but not working well, vitamin D deficiency. On B12. Off and on Prednisone - last course this mo - finished 02/2020.  ROS: Constitutional: + See HPI, + poor sleep Eyes: no blurry vision, no xerophthalmia ENT: no sore throat,  + see HPI, + decreased hearing Cardiovascular: no CP/SOB/palpitations/leg swelling Respiratory: + Cough/no SOB Gastrointestinal: no N/V/D/ + C/+ heartburn Musculoskeletal: + both: muscle/joint aches Skin: no rashes, + easy bruising Neurological: + Tremors/no numbness/tingling/dizziness Psychiatric: no depression/+ anxiety  Past Medical History:  Diagnosis Date  . Allergic rhinitis   . Anemia   . Arthritis    RA  . Carpal tunnel syndrome   . Dysphagia   . Esophageal spasm 01/16/2014  . GERD (gastroesophageal reflux disease)   . Graves disease    remission, no ablation, positive medical treatment  . History of hiatal hernia   . Hypertension   . Migraine headache    migraines  . PPD positive    hepatitis secondary to [place]  . Pure hypercholesterolemia   . Rheumatoid arthritis(714.0)    positive anti CCP antibodies, positive RF, oligo-articular, MTX  . Sciatica    Past Surgical History:  Procedure Laterality  Date  . CARPAL TUNNEL RELEASE Right   . CESAREAN SECTION  1996  . COLONOSCOPY  03/14/2002, 08/16/2007, 03/07/2013   [facility]-father  . COLONOSCOPY WITH PROPOFOL N/A 06/21/2018   Procedure: COLONOSCOPY WITH PROPOFOL;  Surgeon: Manya Silvas, MD;  Location[facility] ENDOSCOPY;  Service: Endoscopy;  Laterality: N/A;  . CRANIOTOMY Left 06/25/2017   Procedure: CRANIOTOMY TEMPORAL LEFT FOR TUMOR RESECTION;  Surgeon: Ashok Pall, MD;  Location: [facility];  Service: Neurosurgery;  Laterality: Left;  . ESOPHAGOGASTRODUODENOSCOPY  03/14/2002,  03/07/2013  . ESOPHAGOGASTRODUODENOSCOPY (EGD) WITH PROPOFOL N/A 06/21/2018   Procedure: ESOPHAGOGASTRODUODENOSCOPY (EGD) WITH PROPOFOL;  Surgeon: Manya Silvas, MD;  Location: [facility] ENDOSCOPY;  Service: Endoscopy;  Laterality: N/A;  . ESOPHAGOGASTRODUODENOSCOPY (EGD) WITH PROPOFOL N/A 07/15/2019   Procedure: ESOPHAGOGASTRODUODENOSCOPY (EGD) WITH PROPOFOL;  Surgeon: Jonathon Bellows, MD;  Location[facility] ENDOSCOPY;  Service: Gastroenterology;  Laterality: N/A;  . Post Septoplasty and turbinate reduction  2007  . TRIGGER FINGER RELEASE     Social History   Socioeconomic History  . Marital status: Married    Spouse name: Not on file  . Number of children: 2  . Years of education: Not on file  . Highest education level: Not on file  Occupational History  . Occupation: Hairstylist  Tobacco Use  . Smoking status: Never Smoker  . Smokeless tobacco: Never Used  Vaping Use  . Vaping Use: Never used  Substance and Sexual Activity  . Alcohol use: Yes    Alcohol/week: 0.0 standard drinks    Comment: wine cooler 2 drinks 2x a week  . Drug use: No  . Sexual activity: Not on file  Other Topics Concern  . Not on file  Social History Narrative   Hairdresser    Social Determinants of Health   Financial Resource Strain:   . Difficulty of Paying Living Expenses: Not on file  Food Insecurity:   . Worried About Charity fundraiser in the Last Year: Not on file  . Ran Out of Food in the Last Year: Not on file  Transportation Needs:   . Lack of Transportation (Medical): Not on file  . Lack of Transportation (Non-Medical): Not on file  Physical Activity:   . Days of Exercise per Week: Not on file  . Minutes of Exercise per Session: Not on file  Stress:   . Feeling of Stress : Not on file  Social Connections:   . Frequency of Communication with Friends and Family: Not on file  . Frequency of Social Gatherings with Friends and Family: Not on file  . Attends Religious Services: Not on file  .  Active Member of Clubs or Organizations: Not on file  . Attends Archivist Meetings: Not on file  . Marital Status: Not on file  Intimate Partner Violence:   . Fear of Current or Ex-Partner: Not on file  . Emotionally Abused: Not on file  . Physically Abused: Not on file  . Sexually Abused: Not on file   Current Outpatient Medications on File Prior to Visit  Medication Sig Dispense Refill  . albuterol (PROVENTIL HFA;VENTOLIN HFA) 108 (90 BASE) MCG/ACT inhaler Inhale 2 puffs into the lungs every 6 (six) hours as needed for wheezing or shortness of breath. 1 Inhaler 2  . azelastine (ASTELIN) 0.1 % nasal spray Place 2 sprays into both nostrils 2 (two) times daily. Use in each nostril as directed 30 mL 12  . benzonatate (TESSALON) 100 MG capsule Take 1 capsule (100 mg total) by mouth  3 (three) times daily as needed for cough. 21 capsule 0  . budesonide-formoterol (SYMBICORT) 80-4.5 MCG/ACT inhaler 1-2 puffs twice daily (rinse mouth after use) 1 each 5  . citalopram (CELEXA) 40 MG tablet TAKE 1 TABLET(40 MG) BY MOUTH DAILY 30 tablet 3  . Dexlansoprazole (DEXILANT) 30 MG capsule Take 1 capsule (30 mg total) by mouth daily. 90 capsule 1  . folic acid (FOLVITE) 1 MG tablet     . hydrochlorothiazide (HYDRODIURIL) 12.5 MG tablet Take 1 tablet (12.5 mg total) by mouth daily. 90 tablet 1  . levocetirizine (XYZAL) 5 MG tablet Take 5 mg by mouth at bedtime.    Marland Kitchen losartan (COZAAR) 100 MG tablet TAKE 1 TABLET(100 MG) BY MOUTH DAILY 30 tablet 2  . medroxyPROGESTERone (DEPO-PROVERA) 150 MG/ML injection Inject 150 mg into the muscle every 3 (three) months.     . methotrexate (RHEUMATREX) 2.5 MG tablet Take 22.5 mg by mouth every Monday. Caution:Chemotherapy. Protect from light. Take 9 tabs po weekly with meals    . montelukast (SINGULAIR) 10 MG tablet take 1 tablet by mouth at bedtime 30 tablet 3  . QVAR REDIHALER 80 MCG/ACT inhaler SMARTSIG:2 Puff(s) By Mouth Twice Daily    . Tocilizumab (ACTEMRA)  162 MG/0.9ML SOSY Inject into the skin.    . valACYclovir (VALTREX) 500 MG tablet Take one tablet bid x 3 days.  May repeat for flares as directed. 30 tablet 0   Current Facility-Administered Medications on File Prior to Visit  Medication Dose Route Frequency Provider Last Rate Last Admin  . betamethasone acetate-betamethasone sodium phosphate (CELESTONE) injection 12 mg  12 mg Other Once Magnus Sinning, MD       Allergies  Allergen Reactions  . Inh [Isoniazid] Other (See Comments)    Causes hepatitis   Family History  Problem Relation Age of Onset  . Cancer Mother        Breast Cancer  . Breast cancer Mother 62  . Cancer Father        Colon Cancer  . Heart disease Father        Hx of MI  . Hypertension Father   . Diabetes Father   . Hyperlipidemia Father   . Cancer Maternal Grandmother        lung cancer  . Cancer Maternal Uncle        esophageal    PE: BP 130/68 (BP Location: Right Arm, Patient Position: Sitting)   Pulse 94   Ht 5' 2.8" (1.595 m)   Wt 189 lb 12.8 oz (86.1 kg)   SpO2 99%   BMI 33.84 kg/m  Wt Readings from Last 3 Encounters:  04/09/20 189 lb 12.8 oz (86.1 kg)  02/20/20 189 lb (85.7 kg)  02/06/20 184 lb 12.8 oz (83.8 kg)   Constitutional: overweight, in NAD Eyes: PERRLA, EOMI, no exophthalmos ENT: moist mucous membranes, no thyromegaly, no cervical lymphadenopathy Cardiovascular: RRR, No MRG Respiratory: CTA B Gastrointestinal: abdomen soft, NT, ND, BS+ Musculoskeletal: no deformities, strength intact in all 4;  Skin: moist, warm, no rashes Neurological: no tremor with outstretched hands, DTR normal in all 4  ASSESSMENT: 1.  Left thyroid nodule  PLAN: 1.  Left thyroid nodule - I reviewed the images of her thyroid ultrasound along with the patient. I pointed out that the dominant nodule is not very large.  Also, it does not have concerning features. It is: - not hypoechoic - without microcalcifications - without internal blood flow -  more wide than tall - well  delimited from surrounding tissue - Moreover, this nodule was biopsied with benign results last year. - Also, pt does not have a thyroid cancer family history in more than 2 members or a personal history of RxTx to head/neck. All these would favor benignity.  -At this visit, we discussed about follow-up with annual ultrasounds for 5 years to assure stability.  If the nodule appears to be stable, no further follow-up is needed - However, have neck compression symptoms.  She had cough and pressure in her neck even before the nodule was diagnosed, but she felt more neck compression symptoms after her biopsy.  We discussed that these can happen due to inflammation at the site and may be even a little bit of bleeding. -We did discuss that the nodule is quite superficial and it would be unlikely to compress the esophagus, however, this may happen when she turns her head.  For now, we can proceed with a barium swallow study to see if there is clear compression of the nodule on the esophagus. - If there is compression, we can proceed with lobectomy if the symptoms are very bothersome, but I explained that while thyroid surgery is not a complicated one, it still can have side effects and also she might have a risk of ~25% of becoming hypothyroid after hemithyroidectomy.  - She agrees that for now, we can just watch her symptoms without intervention, but we should continue the investigation for the nodule. - we we will recheck her TFTs today and afterwards we can proceed with a barium swallow.  We also plan to repeat her thyroid ultrasound next month, year from the previous >> if the nodule changed ultrasound characteristics, she may need a new biopsy - OTW, I will see her back in a year and will repeat the ultrasound then  After results are back, need to order the barium swallow, and also the ultrasound.  Component     Latest Ref Rng & Units 04/09/2020  TSH     0.35 - 4.50 uIU/mL  1.59  T4,Free(Direct)     0.60 - 1.60 ng/dL 0.63  Triiodothyronine,Free,Serum     2.3 - 4.2 pg/mL 4.4 (H)   Normal TSH, slightly high free T3, of unknown significance.  We will proceed with barium swallow and thyroid ultrasound.  I am hoping this can be done in the same visit.  Orders Placed This Encounter  Procedures  . DG ESOPHAGUS W SINGLE CM (SOL OR THIN BA)  . US THYROID   Barium swallow (04/16/2020): Small sliding hiatal hernia. No evidence of esophageal stricture or extrinsic compression. Gastroesophageal reflux to level of midthoracic esophagus.  Thyroid U/S (04/18/2020): Parenchymal Echotexture: Normal Isthmus: 0.3 cm, previously 0.2 cm Right lobe: 3.5 x 1.0 x 1.3 cm, previously 3.5 x 0.9 x 1.2 cm Left lobe: 3.9 x 1.1 x 1.4 cm, previously 3.6 x 1.3 x 1.3 cm _________________________________________________________  Nodule # 1: Location: Right; Mid Maximum size: 0.6 cm; Other 2 dimensions: 0.5 x 0.4 cm Composition: solid/almost completely solid (2) Echogenicity: isoechoic (1) *Given size (<1.4 cm) and appearance, this nodule does NOT meet TI-RADS criteria for biopsy or dedicated follow-up. _________________________________________________________  Nodule # 2: Prior biopsy: No Location: Left; Mid Maximum size: 1.6 cm; Other 2 dimensions: 1.1 x 0.9 cm, previously, 1.7 x 1.3 x 0.9 cm Composition: solid/almost completely solid (2) Echogenicity: isoechoic (1) *Given size (>/= 1.5 - 2.4 cm) and appearance, a follow-up ultrasound in 1 year should be considered based on TI-RADS criteria. _________________________________________________________  IMPRESSION: Similar appearing solid left thyroid nodule (labeled 2), again categorized as TI-RADS 3. Recommend annual ultrasound follow-up until 5 years of stability are established. This study marks 1 year stability.  Philemon Kingdom, MD PhD [facility] Endocrinology

## 2020-04-09 NOTE — Patient Instructions (Signed)
Please stop at the lab.  Please come back for a follow-up appointment in 1 year.   Thyroid Nodule  A thyroid nodule is an isolated growth of thyroid cells that forms a lump in your thyroid gland. The thyroid gland is a butterfly-shaped gland. It is found in the lower front of your neck. This gland sends chemical messengers (hormones) through your blood to all parts of your body. These hormones are important in regulating your body temperature and helping your body to use energy. Thyroid nodules are common. Most are not cancerous (benign). You may have one nodule or several nodules. Different types of thyroid nodules include nodules that:  Grow and fill with fluid (thyroid cysts).  Produce too much thyroid hormone (hot nodules or hyperthyroid).  Produce no thyroid hormone (cold nodules or hypothyroid).  Form from cancer cells (thyroid cancers). What are the causes? In most cases, the cause of this condition is not known. What increases the risk? The following factors may make you more likely to develop this condition.  Age. Thyroid nodules become more common in people who are older than 51 years of age.  Gender. ? Benign thyroid nodules are more common in women. ? Cancerous (malignant) thyroid nodules are more common in men.  A family history that includes: ? Thyroid nodules. ? Pheochromocytoma. ? Thyroid carcinoma. ? Hyperparathyroidism.  Certain kinds of thyroid diseases, such as Hashimoto's thyroiditis.  Lack of iodine in your diet.  A history of head and neck radiation, such as from previous cancer treatment. What are the signs or symptoms? In many cases, there are no symptoms. If you have symptoms, they may include:  A lump in your lower neck.  Feeling a lump or tickle in your throat.  Pain in your neck, jaw, or ear.  Having trouble swallowing. Hot nodules may cause symptoms that include:  Weight loss.  Warm, flushed skin.  Feeling hot.  Feeling  nervous.  A racing heartbeat. Cold nodules may cause symptoms that include:  Weight gain.  Dry skin.  Brittle hair. This may also occur with hair loss.  Feeling cold.  Fatigue. Thyroid cancer nodules may cause symptoms that include:  Hard nodules that feel stuck to the thyroid gland.  Hoarseness.  Lumps in the glands near your thyroid (lymph nodes). How is this diagnosed? A thyroid nodule may be felt by your health care provider during a physical exam. This condition may also be diagnosed based on your symptoms. You may also have tests, including:  An ultrasound. This may be done to confirm the diagnosis.  A biopsy. This involves taking a sample from the nodule and looking at it under a microscope.  Blood tests to make sure that your thyroid is working properly.  A thyroid scan. This test uses a radioactive tracer injected into a vein to create an image of the thyroid gland on a computer screen.  Imaging tests such as MRI or CT scan. These may be done if: ? Your nodule is large. ? Your nodule is blocking your airway. ? Cancer is suspected. How is this treated? Treatment depends on the cause and size of your nodule or nodules. If the nodule is benign, treatment may not be necessary. Your health care provider may monitor the nodule to see if it goes away without treatment. If the nodule continues to grow, is cancerous, or does not go away, treatment may be needed. Treatment may include:  Having a cystic nodule drained with a needle.  Ablation therapy. In  this treatment, alcohol is injected into the area of the nodule to destroy the cells. Ablation with heat (thermal ablation) may also be used.  Radioactive iodine. In this treatment, radioactive iodine is given as a pill or liquid that you drink. This substance causes the thyroid nodule to shrink.  Surgery to remove the nodule. Part or all of your thyroid gland may need to be removed as well.  Medicines. Follow these  instructions at home:  Pay attention to any changes in your nodule.  Take over-the-counter and prescription medicines only as told by your health care provider.  Keep all follow-up visits as told by your health care provider. This is important. Contact a health care provider if:  Your voice changes.  You have trouble swallowing.  You have pain in your neck, ear, or jaw that is getting worse.  Your nodule gets bigger.  Your nodule starts to make it harder for you to breathe.  Your muscles look like they are shrinking (muscle wasting). Get help right away if:  You have chest pain.  There is a loss of consciousness.  You have a sudden fever.  You feel confused.  You are seeing or hearing things that other people do not see or hear (having hallucinations).  You feel very weak.  You have mood swings.  You feel very restless.  You feel suddenly nauseous or throw up.  You suddenly have diarrhea. Summary  A thyroid nodule is an isolated growth of thyroid cells that forms a lump in your thyroid gland.  Thyroid nodules are common. Most are not cancerous (benign). You may have one nodule or several nodules.  Treatment depends on the cause and size of your nodule or nodules. If the nodule is benign, treatment may not be necessary.  Your health care provider may monitor the nodule to see if it goes away without treatment. If the nodule continues to grow, is cancerous, or does not go away, treatment may be needed. This information is not intended to replace advice given to you by your health care provider. Make sure you discuss any questions you have with your health care provider. Document Revised: 01/29/2018 Document Reviewed: 02/01/2018 Elsevier Patient Education  [employer].

## 2020-04-16 ENCOUNTER — Ambulatory Visit
Admission: RE | Admit: 2020-04-16 | Discharge: 2020-04-16 | Disposition: A | Discharge status: Home / Self Care | Payer: 59 | Source: Ambulatory Visit | Attending: Internal Medicine | Admitting: Internal Medicine

## 2020-04-16 ENCOUNTER — Other Ambulatory Visit: Payer: Self-pay | Admitting: Internal Medicine

## 2020-04-16 DIAGNOSIS — E041 Nontoxic single thyroid nodule: Secondary | ICD-10-CM

## 2020-05-02 ENCOUNTER — Other Ambulatory Visit: Payer: Self-pay

## 2020-05-02 ENCOUNTER — Ambulatory Visit (INDEPENDENT_AMBULATORY_CARE_PROVIDER_SITE_OTHER): Payer: 59 | Admitting: Gastroenterology

## 2020-05-02 VITALS — BP 146/96 | HR 92 | Temp 98.7°F | Ht 62.0 in | Wt 191.0 lb

## 2020-05-02 DIAGNOSIS — K449 Diaphragmatic hernia without obstruction or gangrene: Secondary | ICD-10-CM

## 2020-05-02 DIAGNOSIS — K219 Gastro-esophageal reflux disease without esophagitis: Secondary | ICD-10-CM

## 2020-05-02 NOTE — Progress Notes (Signed)
Jonathon Bellows MD, MRCP(U.K) [address]  [place]  [place], [place] [address]  Main: [phone]  Fax: [phone]   Primary Care Physician: Einar Pheasant, MD  Primary Gastroenterologist:  Dr. Jonathon Bellows   Dysphagia follow up   HPI: Tina Parker is a 49 y.o. female  Summary of history : Referred for dysphagia  In 07/2019,previously a patient of Dr. Vira Agar at [facility] last seen at their office in 03/19/2018 for acid reflux.   06/18/2018 colonoscopy: Normal except for internal hemorrhoids 06/18/2018 EGD: Schatzki's ring dilated to 16 mm no biopsies of the esophagus taken biopsies of the stomach taken due to suspicion for gastritis. Which were negative for pathology. 07/15/2019: EGD: Gastritis, GE junction stricture noted and dilated to 18 mm. Normal gastric and esophageal biopsies.    On steroids for her rheumatoid arthritis. Occasional use of NSAIDs. Her other main complaint is bloating. Significant abdominal distention. Gas is foul-smelling. Does not have a bowel movement every day. Probably every other day. Takes Dulcolax as needed. Does not feel satisfied after a bowel movement.    Interval history 08/22/2019-05/02/2020  Since her last visit she has been doing reasonably well.  Occasional dysphagia for bread.  No change in weight she states.  Been taking 30 mg of Dexilant at times needs to increase it to 60 mg.  She is on Metamucil for constipation which works pretty well.  No other complaints.  Current Outpatient Medications  Medication Sig Dispense Refill  . albuterol (PROVENTIL HFA;VENTOLIN HFA) 108 (90 BASE) MCG/ACT inhaler Inhale 2 puffs into the lungs every 6 (six) hours as needed for wheezing or shortness of breath. 1 Inhaler 2  . azelastine (ASTELIN) 0.1 % nasal spray Place 2 sprays into both nostrils 2 (two) times daily. Use in each nostril as directed 30 mL 12  . benzonatate (TESSALON) 100 MG capsule Take 1 capsule (100 mg total) by  mouth 3 (three) times daily as needed for cough. 21 capsule 0  . budesonide-formoterol (SYMBICORT) 80-4.5 MCG/ACT inhaler 1-2 puffs twice daily (rinse mouth after use) 1 each 5  . citalopram (CELEXA) 40 MG tablet TAKE 1 TABLET(40 MG) BY MOUTH DAILY 30 tablet 3  . Dexlansoprazole (DEXILANT) 30 MG capsule Take 1 capsule (30 mg total) by mouth daily. 90 capsule 1  . folic acid (FOLVITE) 1 MG tablet     . hydrochlorothiazide (HYDRODIURIL) 12.5 MG tablet Take 1 tablet (12.5 mg total) by mouth daily. 90 tablet 1  . levocetirizine (XYZAL) 5 MG tablet Take 5 mg by mouth at bedtime.    Marland Kitchen losartan (COZAAR) 100 MG tablet TAKE 1 TABLET(100 MG) BY MOUTH DAILY 30 tablet 2  . medroxyPROGESTERone (DEPO-PROVERA) 150 MG/ML injection Inject 150 mg into the muscle every 3 (three) months.     . methotrexate (RHEUMATREX) 2.5 MG tablet Take 22.5 mg by mouth every Monday. Caution:Chemotherapy. Protect from light. Take 9 tabs po weekly with meals    . montelukast (SINGULAIR) 10 MG tablet take 1 tablet by mouth at bedtime 30 tablet 3  . QVAR REDIHALER 80 MCG/ACT inhaler SMARTSIG:2 Puff(s) By Mouth Twice Daily    . Tocilizumab (ACTEMRA) 162 MG/0.9ML SOSY Inject into the skin. (Patient not taking: Reported on 05/02/2020)    . valACYclovir (VALTREX) 500 MG tablet Take one tablet bid x 3 days.  May repeat for flares as directed. 30 tablet 0   Current Facility-Administered Medications  Medication Dose Route Frequency Provider Last Rate Last Admin  . betamethasone acetate-betamethasone sodium  phosphate (CELESTONE) injection 12 mg  12 mg Other Once Magnus Sinning, MD        Allergies as of 05/02/2020 - Review Complete 05/02/2020  Allergen Reaction Noted  . Inh [isoniazid] Other (See Comments) 07/30/2012    ROS:  General: Negative for anorexia, weight loss, fever, chills, fatigue, weakness. ENT: Negative for hoarseness, difficulty swallowing , nasal congestion. CV: Negative for chest pain, angina, palpitations, dyspnea  on exertion, peripheral edema.  Respiratory: Negative for dyspnea at rest, dyspnea on exertion, cough, sputum, wheezing.  GI: See history of present illness. GU:  Negative for dysuria, hematuria, urinary incontinence, urinary frequency, nocturnal urination.  Endo: Negative for unusual weight change.    Physical Examination:   BP (!) 146/96   Pulse 92   Temp 98.7 F (37.1 C)   Ht 5\' 2"  (1.575 m)   Wt 191 lb (86.6 kg)   BMI 34.93 kg/m   General: Well-nourished, well-developed in no acute distress.  Eyes: No icterus. Conjunctivae pink. Neuro: Alert and oriented x 3.  Grossly intact. Skin: Warm and dry, no jaundice.   Psych: Alert and cooperative, normal mood and affect.   Imaging Studies: US THYROID  Result Date: 04/18/2020 CLINICAL DATA:  Prior ultrasound follow-up. EXAM: THYROID ULTRASOUND TECHNIQUE: Ultrasound examination of the thyroid gland and adjacent soft tissues was performed. COMPARISON:  05/16/2019 FINDINGS: Parenchymal Echotexture: Normal Isthmus: 0.3 cm, previously 0.2 cm Right lobe: 3.5 x 1.0 x 1.3 cm, previously 3.5 x 0.9 x 1.2 cm Left lobe: 3.9 x 1.1 x 1.4 cm, previously 3.6 x 1.3 x 1.3 cm _________________________________________________________ Estimated total number of nodules >/= 1 cm: 1 Number of spongiform nodules >/=  2 cm not described below (TR1): 0 Number of mixed cystic and solid nodules >/= 1.5 cm not described below (Leonard): 0 _________________________________________________________ Nodule # 1: Location: Right; Mid Maximum size: 0.6 cm; Other 2 dimensions: 0.5 x 0.4 cm Composition: solid/almost completely solid (2) Echogenicity: isoechoic (1) Shape: not taller-than-wide (0) Margins: smooth (0) Echogenic foci: none (0) ACR TI-RADS total points: 3. ACR TI-RADS risk category: TR3 (3 points). ACR TI-RADS recommendations: Given size (<1.4 cm) and appearance, this nodule does NOT meet TI-RADS criteria for biopsy or dedicated follow-up.  _________________________________________________________ Nodule # 2: Prior biopsy: No Location: Left; Mid Maximum size: 1.6 cm; Other 2 dimensions: 1.1 x 0.9 cm, previously, 1.7 x 1.3 x 0.9 cm Composition: solid/almost completely solid (2) Echogenicity: isoechoic (1) Shape: not taller-than-wide (0) Margins: smooth (0) Echogenic foci: none (0) ACR TI-RADS total points: 3. ACR TI-RADS risk category:  TR3 (3 points). Significant change in size (>/= 20% in two dimensions and minimal increase of 2 mm): No Change in features: No Change in ACR TI-RADS risk category: No ACR TI-RADS recommendations: *Given size (>/= 1.5 - 2.4 cm) and appearance, a follow-up ultrasound in 1 year should be considered based on TI-RADS criteria. _________________________________________________________ IMPRESSION: Similar appearing solid left thyroid nodule (labeled 2), again categorized as TI-RADS 3. Recommend annual ultrasound follow-up until 5 years of stability are established. This study marks 1 year stability. The above is in keeping with the ACR TI-RADS recommendations - J Am Coll Radiol 2017;14:587-595. Ruthann Cancer, MD [facility] Radiology Electronically Signed   By: Ruthann Cancer MD   On: 04/18/2020 08:57   DG ESOPHAGUS W DOUBLE CM (HD)  Result Date: 04/16/2020 CLINICAL DATA:  Dysphagia. Enlarged thyroid. Hiatal hernia and gastroesophageal reflux disease. EXAM: ESOPHOGRAM / BARIUM SWALLOW / BARIUM TABLET STUDY TECHNIQUE: Combined double  contrast and single contrast examination performed using effervescent crystals, thick barium liquid, and thin barium liquid. The patient was observed with fluoroscopy swallowing a 13 mm barium sulphate tablet. FLUOROSCOPY TIME:  Fluoroscopy Time:  2 minutes 0 seconds Radiation Exposure Index (if provided by the fluoroscopic device): 195.8 mGy Number of Acquired Spot Images: 0 COMPARISON:  None. FINDINGS: Swallowing appears normal, without evidence  of vestibular penetration or aspiration. No evidence of esophageal mass or stricture. No evidence of extrinsic mass effect. No findings of esophagitis noted. Esophageal motility is within normal limits. A small sliding hiatal hernia is seen. Gastroesophageal reflux is seen to the level of the midthoracic esophagus. An ingested 52mm barium tablet passed freely through the esophagus, and into the stomach. IMPRESSION: Small sliding hiatal hernia. No evidence of esophageal stricture or extrinsic compression. Gastroesophageal reflux to level of midthoracic esophagus. Electronically Signed   By: Marlaine Hind M.D.   On: 04/16/2020 11:25    Assessment and Plan:   Tina Parker is a 49 y.o. y/o female here to follow up for dysphagia and constipation.  Plan 1.    She is doing reasonably well.  I did explain to her that she would need to stay on long-term PPI therapy since she has a good sized hiatal hernia.  1 option is to discuss with the bariatric surgeon about hiatal hernia repair/weight loss surgery.  I did tell her that short of that she would need to be on PPI for many years if not forever.  I did also suggest to continue trying to lose weight.  2.  She is keen to be referred to a weight loss surgeon/hiatal hernia repair surgeon to have a discussion.  3.  Continue Dexilant  Dr Jonathon Bellows  MD,MRCP [facility]) Follow up in as needed

## 2020-05-05 ENCOUNTER — Encounter: Payer: Self-pay | Admitting: Internal Medicine

## 2020-05-07 NOTE — Telephone Encounter (Signed)
Per the 03/02/20 pt message, hctz is not one of the medications that we normally see causing cough.  If feels needs to change, can schedule an appt to discuss treatment options.  (can schedule virtual visit)

## 2020-05-08 ENCOUNTER — Telehealth (INDEPENDENT_AMBULATORY_CARE_PROVIDER_SITE_OTHER): Payer: 59 | Admitting: Internal Medicine

## 2020-05-08 ENCOUNTER — Other Ambulatory Visit: Payer: Self-pay

## 2020-05-08 DIAGNOSIS — R059 Cough, unspecified: Secondary | ICD-10-CM

## 2020-05-08 DIAGNOSIS — M059 Rheumatoid arthritis with rheumatoid factor, unspecified: Secondary | ICD-10-CM | POA: Diagnosis not present

## 2020-05-08 DIAGNOSIS — K219 Gastro-esophageal reflux disease without esophagitis: Secondary | ICD-10-CM | POA: Diagnosis not present

## 2020-05-08 MED ORDER — CHLORTHALIDONE 25 MG PO TABS: ORAL_TABLET | ORAL | 1 refills | Status: DC

## 2020-05-08 NOTE — Telephone Encounter (Signed)
[place] with virtual visit.  Can do 4:30 or can do 12:00 today if this time is better.

## 2020-05-08 NOTE — Telephone Encounter (Signed)
Pt scheduled  

## 2020-05-08 NOTE — Progress Notes (Signed)
Patient ID: Tina Parker, female   DOB: 07/22/1970, 49 y.o.   MRN: [id number]   Virtual Visit via video Note  This visit type was conducted due to national recommendations for restrictions regarding the COVID-19 pandemic (e.g. social distancing).  This format is felt to be most appropriate for this patient at this time.  All issues noted in this document were discussed and addressed.  No physical exam was performed (except for noted visual exam findings with Video Visits).   I connected with [employer] by a video enabled telemedicine application and verified that I am speaking with the correct person using two identifiers. Location patient: home Location provider: work Persons participating in the virtual visit: patient, provider  The limitations, risks, security and privacy concerns of performing an evaluation and management service by video and the availability of in person appointments have been discussed.  It has also been discussed with the patient that there may be a patient responsible charge related to this service. The patient expressed understanding and agreed to proceed.   Reason for visit: work in appt.    HPI: Work in with concerns regarding cough.  She is concerned that HCTZ contributing to her cough.  States recently stopped - for 4 weeks.  After off 2.5 weeks - cough decreased.  After back on for several weeks, cough returned.  No acid reflux.  Feels this is well controlled.  No increased sinus congestion or drainage.  No chest pain or sob.  No chest tightness.  No abdominal pain.  Has been having increased joint pains.  Recently started on avsola.  Had infusion 04/30/20.  Followed by rheumatology.    ROS: See pertinent positives and negatives per HPI.  Past Medical History:  Diagnosis Date  . Allergic rhinitis   . Anemia   . Arthritis    RA  . Carpal tunnel syndrome   . Dysphagia   . Esophageal spasm 01/16/2014  . GERD (gastroesophageal reflux disease)   .  Graves disease    remission, no ablation, positive medical treatment  . History of hiatal hernia   . Hypertension   . Migraine headache    migraines  . PPD positive    hepatitis secondary to [place]  . Pure hypercholesterolemia   . Rheumatoid arthritis(714.0)    positive anti CCP antibodies, positive RF, oligo-articular, MTX  . Sciatica     Past Surgical History:  Procedure Laterality Date  . CARPAL TUNNEL RELEASE Right   . CESAREAN SECTION  1996  . COLONOSCOPY  03/14/2002, 08/16/2007, 03/07/2013   [facility]-father  . COLONOSCOPY WITH PROPOFOL N/A 06/21/2018   Procedure: COLONOSCOPY WITH PROPOFOL;  Surgeon: Manya Silvas, MD;  Location[facility] ENDOSCOPY;  Service: Endoscopy;  Laterality: N/A;  . CRANIOTOMY Left 06/25/2017   Procedure: CRANIOTOMY TEMPORAL LEFT FOR TUMOR RESECTION;  Surgeon: Ashok Pall, MD;  Location: [facility];  Service: Neurosurgery;  Laterality: Left;  . ESOPHAGOGASTRODUODENOSCOPY  03/14/2002, 03/07/2013  . ESOPHAGOGASTRODUODENOSCOPY (EGD) WITH PROPOFOL N/A 06/21/2018   Procedure: ESOPHAGOGASTRODUODENOSCOPY (EGD) WITH PROPOFOL;  Surgeon: Manya Silvas, MD;  Location: [facility] ENDOSCOPY;  Service: Endoscopy;  Laterality: N/A;  . ESOPHAGOGASTRODUODENOSCOPY (EGD) WITH PROPOFOL N/A 07/15/2019   Procedure: ESOPHAGOGASTRODUODENOSCOPY (EGD) WITH PROPOFOL;  Surgeon: Jonathon Bellows, MD;  Location: [facility] ENDOSCOPY;  Service: Gastroenterology;  Laterality: N/A;  . Post Septoplasty and turbinate reduction  2007  . TRIGGER FINGER RELEASE      Family History  Problem Relation Age of Onset  . Cancer Mother  Breast Cancer  . Breast cancer Mother 21  . Cancer Father        Colon Cancer  . Heart disease Father        Hx of MI  . Hypertension Father   . Diabetes Father   . Hyperlipidemia Father   . Cancer Maternal Grandmother        lung cancer  . Cancer Maternal Uncle        esophageal    SOCIAL HX: reviewed.    Current Outpatient Medications:  .  albuterol (PROVENTIL  HFA;VENTOLIN HFA) 108 (90 BASE) MCG/ACT inhaler, Inhale 2 puffs into the lungs every 6 (six) hours as needed for wheezing or shortness of breath., Disp: 1 Inhaler, Rfl: 2 .  azelastine (ASTELIN) 0.1 % nasal spray, Place 2 sprays into both nostrils 2 (two) times daily. Use in each nostril as directed, Disp: 30 mL, Rfl: 12 .  benzonatate (TESSALON) 100 MG capsule, Take 1 capsule (100 mg total) by mouth 3 (three) times daily as needed for cough., Disp: 21 capsule, Rfl: 0 .  budesonide-formoterol (SYMBICORT) 80-4.5 MCG/ACT inhaler, 1-2 puffs twice daily (rinse mouth after use), Disp: 1 each, Rfl: 5 .  chlorthalidone (HYGROTON) 25 MG tablet, Take 1/2 tablet q day, Disp: 15 tablet, Rfl: 1 .  citalopram (CELEXA) 40 MG tablet, TAKE 1 TABLET(40 MG) BY MOUTH DAILY, Disp: 30 tablet, Rfl: 3 .  Dexlansoprazole (DEXILANT) 30 MG capsule, Take 1 capsule (30 mg total) by mouth daily., Disp: 90 capsule, Rfl: 1 .  folic acid (FOLVITE) 1 MG tablet, , Disp: , Rfl:  .  levocetirizine (XYZAL) 5 MG tablet, Take 5 mg by mouth at bedtime., Disp: , Rfl:  .  losartan (COZAAR) 100 MG tablet, TAKE 1 TABLET(100 MG) BY MOUTH DAILY, Disp: 30 tablet, Rfl: 2 .  medroxyPROGESTERone (DEPO-PROVERA) 150 MG/ML injection, Inject 150 mg into the muscle every 3 (three) months. , Disp: , Rfl:  .  methotrexate (RHEUMATREX) 2.5 MG tablet, Take 22.5 mg by mouth every Monday. Caution:Chemotherapy. Protect from light. Take 9 tabs po weekly with meals, Disp: , Rfl:  .  montelukast (SINGULAIR) 10 MG tablet, take 1 tablet by mouth at bedtime, Disp: 30 tablet, Rfl: 3 .  QVAR REDIHALER 80 MCG/ACT inhaler, SMARTSIG:2 Puff(s) By Mouth Twice Daily, Disp: , Rfl:  .  Tocilizumab (ACTEMRA) 162 MG/0.9ML SOSY, Inject into the skin. (Patient not taking: Reported on 05/02/2020), Disp: , Rfl:  .  valACYclovir (VALTREX) 500 MG tablet, Take one tablet bid x 3 days.  May repeat for flares as directed., Disp: 30 tablet, Rfl: 0  Current Facility-Administered  Medications:  .  betamethasone acetate-betamethasone sodium phosphate (CELESTONE) injection 12 mg, 12 mg, Other, Once, Magnus Sinning, MD  EXAM:  GENERAL: alert, oriented, appears well and in no acute distress  HEENT: atraumatic, conjunttiva clear, no obvious abnormalities on inspection of external nose and ears  NECK: normal movements of the head and neck  LUNGS: on inspection no signs of respiratory distress, breathing rate appears normal, no obvious gross SOB, gasping or wheezing  CV: no obvious cyanosis  PSYCH/NEURO: pleasant and cooperative, no obvious depression or anxiety, speech and thought processing grossly intact  ASSESSMENT AND PLAN:  Discussed the following assessment and plan:  Problem List Items Addressed This Visit    Rheumatoid arthritis [facility])    Being followed by rheumatology.        GERD (gastroesophageal reflux disease)    Acid reflux controlled.  Continue dexilant.  Cough    Recent evaluated.  PFTs normal.  Diffusion capacity normal.  Continue current inhaler regimen.  Saw pulmonary recently and recommended starting on symbicort.  She recently stopped hctz.  Cough improved.  She feels cough related to hctz.  When restarted, cough returned.  Feels needs diuretic given increased fluid retention when off.  Will stop hctz.  Start chlorthalidone 1/2 tablet q day.  Follow pressures.  Follow metabolic panel.            I discussed the assessment and treatment plan with the patient. The patient was provided an opportunity to ask questions and all were answered. The patient agreed with the plan and demonstrated an understanding of the instructions.   The patient was advised to call back or seek an in-person evaluation if the symptoms worsen or if the condition fails to improve as anticipated.    Einar Pheasant, MD

## 2020-05-13 ENCOUNTER — Encounter: Payer: Self-pay | Admitting: Internal Medicine

## 2020-05-13 NOTE — Assessment & Plan Note (Signed)
Being followed by rheumatology.

## 2020-05-13 NOTE — Assessment & Plan Note (Signed)
Recent evaluated.  PFTs normal.  Diffusion capacity normal.  Continue current inhaler regimen.  Saw pulmonary recently and recommended starting on symbicort.  She recently stopped hctz.  Cough improved.  She feels cough related to hctz.  When restarted, cough returned.  Feels needs diuretic given increased fluid retention when off.  Will stop hctz.  Start chlorthalidone 1/2 tablet q day.  Follow pressures.  Follow metabolic panel.

## 2020-05-13 NOTE — Assessment & Plan Note (Signed)
Acid reflux controlled.  Continue dexilant.

## 2020-05-28 ENCOUNTER — Encounter: Payer: Self-pay | Admitting: Internal Medicine

## 2020-06-07 ENCOUNTER — Other Ambulatory Visit: Payer: 59

## 2020-06-11 ENCOUNTER — Ambulatory Visit: Payer: 59 | Admitting: Internal Medicine

## 2020-06-11 DIAGNOSIS — S62009A Unspecified fracture of navicular [scaphoid] bone of unspecified wrist, initial encounter for closed fracture: Secondary | ICD-10-CM | POA: Insufficient documentation

## 2020-06-21 ENCOUNTER — Other Ambulatory Visit: Payer: Self-pay | Admitting: Internal Medicine

## 2020-06-24 ENCOUNTER — Encounter: Payer: Self-pay | Admitting: Internal Medicine

## 2020-06-24 DIAGNOSIS — M25531 Pain in right wrist: Secondary | ICD-10-CM | POA: Insufficient documentation

## 2020-07-07 ENCOUNTER — Other Ambulatory Visit: Payer: Self-pay | Admitting: Internal Medicine

## 2020-07-12 ENCOUNTER — Telehealth: Payer: Self-pay | Admitting: *Deleted

## 2020-07-12 ENCOUNTER — Other Ambulatory Visit: Payer: 59

## 2020-07-12 DIAGNOSIS — E7800 Pure hypercholesterolemia, unspecified: Secondary | ICD-10-CM

## 2020-07-12 DIAGNOSIS — I10 Essential (primary) hypertension: Secondary | ICD-10-CM

## 2020-07-12 DIAGNOSIS — R739 Hyperglycemia, unspecified: Secondary | ICD-10-CM

## 2020-07-12 NOTE — Telephone Encounter (Signed)
Order placed for labs.

## 2020-07-12 NOTE — Telephone Encounter (Signed)
Please place future orders for lab appt.  

## 2020-07-13 ENCOUNTER — Other Ambulatory Visit: Payer: Self-pay

## 2020-07-13 ENCOUNTER — Other Ambulatory Visit (INDEPENDENT_AMBULATORY_CARE_PROVIDER_SITE_OTHER): Payer: 59

## 2020-07-13 DIAGNOSIS — I10 Essential (primary) hypertension: Secondary | ICD-10-CM | POA: Diagnosis not present

## 2020-07-13 DIAGNOSIS — R739 Hyperglycemia, unspecified: Secondary | ICD-10-CM

## 2020-07-13 DIAGNOSIS — E7800 Pure hypercholesterolemia, unspecified: Secondary | ICD-10-CM | POA: Diagnosis not present

## 2020-07-13 LAB — LIPID PANEL
Cholesterol: 176 mg/dL (ref 0–200)
HDL: 51.8 mg/dL (ref >39.00)
LDL Cholesterol: 109 mg/dL — ABNORMAL HIGH (ref 0–99)
NonHDL: 124.17
Total CHOL/HDL Ratio: 3
Triglycerides: 76 mg/dL (ref 0.0–149.0)
VLDL: 15.2 mg/dL (ref 0.0–40.0)

## 2020-07-13 LAB — BASIC METABOLIC PANEL
BUN: 16 mg/dL (ref 6–23)
CO2: 26 mEq/L (ref 19–32)
Calcium: 9.6 mg/dL (ref 8.4–10.5)
Chloride: 104 mEq/L (ref 96–112)
Creatinine, Ser: 1 mg/dL (ref 0.40–1.20)
GFR: 65.32 mL/min (ref >60.00)
Glucose, Bld: 94 mg/dL (ref 70–99)
Potassium: 4.1 mEq/L (ref 3.5–5.1)
Sodium: 137 mEq/L (ref 135–145)

## 2020-07-13 LAB — HEPATIC FUNCTION PANEL
ALT: 13 U/L (ref 0–35)
AST: 18 U/L (ref 0–37)
Albumin: 4 g/dL (ref 3.5–5.2)
Alkaline Phosphatase: 91 U/L (ref 39–117)
Bilirubin, Direct: 0.2 mg/dL (ref 0.0–0.3)
Total Bilirubin: 0.5 mg/dL (ref 0.2–1.2)
Total Protein: 7.7 g/dL (ref 6.0–8.3)

## 2020-07-13 LAB — HEMOGLOBIN A1C: Hgb A1c MFr Bld: 5.7 % (ref 4.6–6.5)

## 2020-07-16 ENCOUNTER — Other Ambulatory Visit: Payer: 59

## 2020-07-19 ENCOUNTER — Ambulatory Visit: Payer: 59 | Admitting: Internal Medicine

## 2020-07-24 ENCOUNTER — Ambulatory Visit: Payer: 59 | Admitting: Internal Medicine

## 2020-07-24 ENCOUNTER — Other Ambulatory Visit: Payer: Self-pay

## 2020-07-24 ENCOUNTER — Encounter: Payer: Self-pay | Admitting: Internal Medicine

## 2020-07-24 VITALS — BP 122/70 | HR 90 | Temp 98.2°F | Resp 16 | Ht 62.0 in | Wt 183.4 lb

## 2020-07-24 DIAGNOSIS — E7800 Pure hypercholesterolemia, unspecified: Secondary | ICD-10-CM

## 2020-07-24 DIAGNOSIS — D329 Benign neoplasm of meninges, unspecified: Secondary | ICD-10-CM

## 2020-07-24 DIAGNOSIS — Z9109 Other allergy status, other than to drugs and biological substances: Secondary | ICD-10-CM

## 2020-07-24 DIAGNOSIS — E041 Nontoxic single thyroid nodule: Secondary | ICD-10-CM | POA: Diagnosis not present

## 2020-07-24 DIAGNOSIS — G4730 Sleep apnea, unspecified: Secondary | ICD-10-CM

## 2020-07-24 DIAGNOSIS — Z1231 Encounter for screening mammogram for malignant neoplasm of breast: Secondary | ICD-10-CM

## 2020-07-24 DIAGNOSIS — E039 Hypothyroidism, unspecified: Secondary | ICD-10-CM

## 2020-07-24 DIAGNOSIS — I10 Essential (primary) hypertension: Secondary | ICD-10-CM

## 2020-07-24 DIAGNOSIS — M059 Rheumatoid arthritis with rheumatoid factor, unspecified: Secondary | ICD-10-CM

## 2020-07-24 DIAGNOSIS — K219 Gastro-esophageal reflux disease without esophagitis: Secondary | ICD-10-CM

## 2020-07-24 DIAGNOSIS — R739 Hyperglycemia, unspecified: Secondary | ICD-10-CM

## 2020-07-24 DIAGNOSIS — D649 Anemia, unspecified: Secondary | ICD-10-CM

## 2020-07-24 DIAGNOSIS — R059 Cough, unspecified: Secondary | ICD-10-CM

## 2020-07-24 DIAGNOSIS — F419 Anxiety disorder, unspecified: Secondary | ICD-10-CM

## 2020-07-24 MED ORDER — CITALOPRAM HYDROBROMIDE 40 MG PO TABS: ORAL_TABLET | ORAL | 5 refills | Status: DC

## 2020-07-24 NOTE — Assessment & Plan Note (Addendum)
The 10-year ASCVD risk score Mikey Bussing DC Brooke Bonito., et al., 2013) is: 3%   Values used to calculate the score:     Age: 50 years     Sex: Female     Is Non-Hispanic African American: Yes     Diabetic: No     Tobacco smoker: No     Systolic Blood Pressure: 182 mmHg     Is BP treated: Yes     HDL Cholesterol: 51.8 mg/dL     Total Cholesterol: 176 mg/dL   Discussed labs.  Discussed continued diet and exercise.  Follow lipid panel.

## 2020-07-24 NOTE — Progress Notes (Signed)
Patient ID: Tina Parker, female   DOB: 10-06-1970, 50 y.o.   MRN: [id number]   Subjective:    Patient ID: Tina Parker, female    DOB: [birth year], 50 y.o.   MRN: [id number]  HPI This visit occurred during the SARS-CoV-2 public health emergency.  Safety protocols were in place, including screening questions prior to the visit, additional usage of staff PPE, and extensive cleaning of exam room while observing appropriate contact time as indicated for disinfecting solutions.  Patient here for a scheduled follow up.  Here to follow up regarding her blood pressure and increased stress.  Recently her hctz was changed to chlorthalidone.  She felt the hctz was contributing to her cough.  Blood pressure has been doing relatively well.  She tries to stay active.  No chest pain or increased sob reported.  Acid reflux has been under reasonable control with dexilant.  Recently increased dose to 60mg  q day.  No abdominal pain.  Bowels moving.  Sees rheumatology for RA.  Recently started on avsola. Joints doing better.  No significant cough now.  [place] recently and broke her right wrist.  Seeing ortho.  Has f/u this week.  Recently carpal tunnel procedure.  Left hand doing well.    Past Medical History:  Diagnosis Date  . Allergic rhinitis   . Anemia   . Arthritis    RA  . Carpal tunnel syndrome   . Dysphagia   . Esophageal spasm 01/16/2014  . GERD (gastroesophageal reflux disease)   . Graves disease    remission, no ablation, positive medical treatment  . History of hiatal hernia   . Hypertension   . Migraine headache    migraines  . PPD positive    hepatitis secondary to [place]  . Pure hypercholesterolemia   . Rheumatoid arthritis(714.0)    positive anti CCP antibodies, positive RF, oligo-articular, MTX  . Sciatica    Past Surgical History:  Procedure Laterality Date  . CARPAL TUNNEL RELEASE Right   . CESAREAN SECTION  1996  . COLONOSCOPY  03/14/2002, 08/16/2007, 03/07/2013   [facility]-father  .  COLONOSCOPY WITH PROPOFOL N/A 06/21/2018   Procedure: COLONOSCOPY WITH PROPOFOL;  Surgeon: Manya Silvas, MD;  Location: [facility] ENDOSCOPY;  Service: Endoscopy;  Laterality: N/A;  . CRANIOTOMY Left 06/25/2017   Procedure: CRANIOTOMY TEMPORAL LEFT FOR TUMOR RESECTION;  Surgeon: Ashok Pall, MD;  Location: [place];  Service: Neurosurgery;  Laterality: Left;  . ESOPHAGOGASTRODUODENOSCOPY  03/14/2002, 03/07/2013  . ESOPHAGOGASTRODUODENOSCOPY (EGD) WITH PROPOFOL N/A 06/21/2018   Procedure: ESOPHAGOGASTRODUODENOSCOPY (EGD) WITH PROPOFOL;  Surgeon: Manya Silvas, MD;  Location: [facility] ENDOSCOPY;  Service: Endoscopy;  Laterality: N/A;  . ESOPHAGOGASTRODUODENOSCOPY (EGD) WITH PROPOFOL N/A 07/15/2019   Procedure: ESOPHAGOGASTRODUODENOSCOPY (EGD) WITH PROPOFOL;  Surgeon: Jonathon Bellows, MD;  Location: [facility] ENDOSCOPY;  Service: Gastroenterology;  Laterality: N/A;  . Post Septoplasty and turbinate reduction  2007  . TRIGGER FINGER RELEASE     Family History  Problem Relation Age of Onset  . Cancer Mother        Breast Cancer  . Breast cancer Mother 3  . Cancer Father        Colon Cancer  . Heart disease Father        Hx of MI  . Hypertension Father   . Diabetes Father   . Hyperlipidemia Father   . Cancer Maternal Grandmother        lung cancer  . Cancer Maternal Uncle        esophageal  Social History   Socioeconomic History  . Marital status: Married    Spouse name: Not on file  . Number of children: 2  . Years of education: Not on file  . Highest education level: Not on file  Occupational History  . Occupation: Hairstylist  Tobacco Use  . Smoking status: Never Smoker  . Smokeless tobacco: Never Used  Vaping Use  . Vaping Use: Never used  Substance and Sexual Activity  . Alcohol use: Yes    Alcohol/week: 0.0 standard drinks    Comment: wine cooler 2 drinks 2x a week  . Drug use: No  . Sexual activity: Not on file  Other Topics Concern  . Not on file  Social History Narrative    Hairdresser    Social Determinants of Health   Financial Resource Strain: Not on file  Food Insecurity: Not on file  Transportation Needs: Not on file  Physical Activity: Not on file  Stress: Not on file  Social Connections: Not on file     Review of Systems  Constitutional: Negative for appetite change and unexpected weight change.  HENT: Negative for congestion and sinus pressure.   Respiratory: Negative for cough, chest tightness and shortness of breath.   Cardiovascular: Negative for chest pain, palpitations and leg swelling.  Gastrointestinal: Negative for abdominal pain, diarrhea, nausea and vomiting.  Genitourinary: Negative for difficulty urinating and dysuria.  Musculoskeletal: Negative for myalgias.       Joints doing better.   Skin: Negative for color change and rash.  Neurological: Negative for dizziness, light-headedness and headaches.  Psychiatric/Behavioral: Negative for agitation and dysphoric mood.       Objective:    Physical Exam Vitals reviewed.  Constitutional:      General: She is not in acute distress.    Appearance: Normal appearance.  HENT:     Head: Normocephalic and atraumatic.     Right Ear: External ear normal.     Left Ear: External ear normal.     Mouth/Throat:     Mouth: Oropharynx is clear and moist.  Eyes:     General: No scleral icterus.       Right eye: No discharge.        Left eye: No discharge.  Neck:     Thyroid: No thyromegaly.  Cardiovascular:     Rate and Rhythm: Normal rate and regular rhythm.  Pulmonary:     Effort: No respiratory distress.     Breath sounds: Normal breath sounds. No wheezing.  Abdominal:     General: Bowel sounds are normal.     Palpations: Abdomen is soft.     Tenderness: There is no abdominal tenderness.  Musculoskeletal:        General: No swelling, tenderness or edema.     Cervical back: Neck supple. No tenderness.  Lymphadenopathy:     Cervical: No cervical adenopathy.  Skin:    Findings:  No erythema or rash.  Neurological:     Mental Status: She is alert.  Psychiatric:        Mood and Affect: Mood normal.        Behavior: Behavior normal.     BP 122/70   Pulse 90   Temp 98.2 F (36.8 C) (Oral)   Resp 16   Ht 5\' 2"  (1.575 m)   Wt 183 lb 6.4 oz (83.2 kg)   SpO2 98%   BMI 33.54 kg/m  Wt Readings from Last 3 Encounters:  07/24/20 183 lb 6.4 oz (  83.2 kg)  05/02/20 191 lb (86.6 kg)  04/09/20 189 lb 12.8 oz (86.1 kg)     Lab Results  Component Value Date   WBC 4.5 03/12/2020   HGB 12.7 03/12/2020   HCT 38.0 03/12/2020   PLT 262.0 03/12/2020   GLUCOSE 94 07/13/2020   CHOL 176 07/13/2020   TRIG 76.0 07/13/2020   HDL 51.80 07/13/2020   LDLCALC 109 (H) 07/13/2020   ALT 13 07/13/2020   AST 18 07/13/2020   NA 137 07/13/2020   K 4.1 07/13/2020   CL 104 07/13/2020   CREATININE 1.00 07/13/2020   BUN 16 07/13/2020   CO2 26 07/13/2020   TSH 1.59 04/09/2020   HGBA1C 5.7 07/13/2020    US THYROID  Result Date: 04/18/2020 CLINICAL DATA:  Prior ultrasound follow-up. EXAM: THYROID ULTRASOUND TECHNIQUE: Ultrasound examination of the thyroid gland and adjacent soft tissues was performed. COMPARISON:  05/16/2019 FINDINGS: Parenchymal Echotexture: Normal Isthmus: 0.3 cm, previously 0.2 cm Right lobe: 3.5 x 1.0 x 1.3 cm, previously 3.5 x 0.9 x 1.2 cm Left lobe: 3.9 x 1.1 x 1.4 cm, previously 3.6 x 1.3 x 1.3 cm _________________________________________________________ Estimated total number of nodules >/= 1 cm: 1 Number of spongiform nodules >/=  2 cm not described below (TR1): 0 Number of mixed cystic and solid nodules >/= 1.5 cm not described below ([place]): 0 _________________________________________________________ Nodule # 1: Location: Right; Mid Maximum size: 0.6 cm; Other 2 dimensions: 0.5 x 0.4 cm Composition: solid/almost completely solid (2) Echogenicity: isoechoic (1) Shape: not taller-than-wide (0) Margins: smooth (0) Echogenic foci: none (0) ACR TI-RADS total points:  3. ACR TI-RADS risk category: TR3 (3 points). ACR TI-RADS recommendations: Given size (<1.4 cm) and appearance, this nodule does NOT meet TI-RADS criteria for biopsy or dedicated follow-up. _________________________________________________________ Nodule # 2: Prior biopsy: No Location: Left; Mid Maximum size: 1.6 cm; Other 2 dimensions: 1.1 x 0.9 cm, previously, 1.7 x 1.3 x 0.9 cm Composition: solid/almost completely solid (2) Echogenicity: isoechoic (1) Shape: not taller-than-wide (0) Margins: smooth (0) Echogenic foci: none (0) ACR TI-RADS total points: 3. ACR TI-RADS risk category:  TR3 (3 points). Significant change in size (>/= 20% in two dimensions and minimal increase of 2 mm): No Change in features: No Change in ACR TI-RADS risk category: No ACR TI-RADS recommendations: *Given size (>/= 1.5 - 2.4 cm) and appearance, a follow-up ultrasound in 1 year should be considered based on TI-RADS criteria. _________________________________________________________ IMPRESSION: Similar appearing solid left thyroid nodule (labeled 2), again categorized as TI-RADS 3. Recommend annual ultrasound follow-up until 5 years of stability are established. This study marks 1 year stability. The above is in keeping with the ACR TI-RADS recommendations - J Am Coll Radiol 2017;14:587-595. Ruthann Cancer, MD [facility] Rad[facility] Electronically Signed   By: Ruthann Cancer MD   On: 04/18/2020 08:57   DG ESOPHAGUS W DOUBLE CM (HD)  Result Date: 04/16/2020 CLINICAL DATA:  Dysphagia. Enlarged thyroid. Hiatal hernia and gastroesophageal reflux disease. EXAM: ESOPHOGRAM / BARIUM SWALLOW / BARIUM TABLET STUDY TECHNIQUE: Combined double contrast and single contrast examination performed using effervescent crystals, thick barium liquid, and thin barium liquid. The patient was observed with fluoroscopy swallowing a 13 mm barium sulphate tablet. FLUOROSCOPY TIME:  Fluoroscopy Time:  2 minutes 0  seconds Radiation Exposure Index (if provided by the fluoroscopic device): 195.8 mGy Number of Acquired Spot Images: 0 COMPARISON:  None. FINDINGS: Swallowing appears normal, without evidence of vestibular penetration or aspiration. No evidence of esophageal  mass or stricture. No evidence of extrinsic mass effect. No findings of esophagitis noted. Esophageal motility is within normal limits. A small sliding hiatal hernia is seen. Gastroesophageal reflux is seen to the level of the midthoracic esophagus. An ingested 65mm barium tablet passed freely through the esophagus, and into the stomach. IMPRESSION: Small sliding hiatal hernia. No evidence of esophageal stricture or extrinsic compression. Gastroesophageal reflux to level of midthoracic esophagus. Electronically Signed   By: Marlaine Hind M.D.   On: 04/16/2020 11:25       Assessment & Plan:   Problem List Items Addressed This Visit    Anemia    Follow cbc.       Anxiety    Doing well on citalopram.  Follow.        Relevant Medications   citalopram (CELEXA) 40 MG tablet   Cough    PFTs normal.  Diffusion capacity normal.  Doing well on current regimen.  No significant cough.  Follow.        Environmental allergies    Seeing an allergist.  Cough is better. Doing well on singulair, flonase and xyzal.  Follow.       Essential hypertension, benign    Continue losartan and chlorthalidone.  Blood pressure doing well.  Follow.       Relevant Orders   Basic metabolic panel   GERD (gastroesophageal reflux disease)    On dexilant.  Recently dose increased.  Controlling.  Follow.       Relevant Medications   inFLIXimab-axxq (AVSOLA) 100 MG SOLR   Hypercholesterolemia    The 10-year ASCVD risk score Mikey Bussing DC Jr., et al., 2013) is: 3%   Values used to calculate the score:     Age: 32 years     Sex: Female     Is Non-Hispanic African American: Yes     Diabetic: No     Tobacco smoker: No     Systolic Blood Pressure: 123XX123 mmHg     Is BP  treated: Yes     HDL Cholesterol: 51.8 mg/dL     Total Cholesterol: 176 mg/dL   Discussed labs.  Discussed continued diet and exercise.  Follow lipid panel.       Relevant Orders   Lipid panel   Hepatic function panel   Hypothyroidism    On thyroid replacement.  Follow tsh.       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       Mild sleep apnea    CPAP.       Rheumatoid arthritis ([place])    Followed by rheumatology.  Joints doing better.       Relevant Orders   CBC with Differential/Platelet   Thyroid nodule    S/p FNA - Dr Gabriel Carina 07/11/18 - benign.  Has f/u planned.        Other Visit Diagnoses    Visit for screening mammogram    -  Primary   Relevant Orders   MM 3D SCREEN BREAST BILATERAL   Hyperglycemia       Relevant Orders   Hemoglobin A1c       Einar Pheasant, MD

## 2020-07-30 ENCOUNTER — Encounter: Payer: Self-pay | Admitting: Internal Medicine

## 2020-07-30 NOTE — Assessment & Plan Note (Signed)
Followed by Dr Cabbell - neurosurgery.   

## 2020-07-30 NOTE — Assessment & Plan Note (Signed)
Doing well on citalopram.  Follow.   

## 2020-07-30 NOTE — Assessment & Plan Note (Signed)
S/p FNA - Dr Gabriel Carina 07/11/18 - benign.  Has f/u planned.

## 2020-07-30 NOTE — Assessment & Plan Note (Signed)
PFTs normal.  Diffusion capacity normal.  Doing well on current regimen.  No significant cough.  Follow.

## 2020-07-30 NOTE — Assessment & Plan Note (Signed)
Followed by rheumatology.  Joints doing better.

## 2020-07-30 NOTE — Assessment & Plan Note (Signed)
On dexilant.  Recently dose increased.  Controlling.  Follow.

## 2020-07-30 NOTE — Assessment & Plan Note (Signed)
On thyroid replacement.  Follow tsh.  

## 2020-07-30 NOTE — Assessment & Plan Note (Signed)
Follow cbc.  

## 2020-07-30 NOTE — Assessment & Plan Note (Signed)
Seeing an allergist.  Cough is better. Doing well on singulair, flonase and xyzal.  Follow.

## 2020-07-30 NOTE — Assessment & Plan Note (Signed)
Continue losartan and chlorthalidone.  Blood pressure doing well.  Follow.

## 2020-07-30 NOTE — Assessment & Plan Note (Signed)
CPAP.  

## 2020-08-01 ENCOUNTER — Encounter: Payer: Self-pay | Admitting: Internal Medicine

## 2020-08-09 ENCOUNTER — Other Ambulatory Visit: Payer: Self-pay

## 2020-08-09 ENCOUNTER — Ambulatory Visit: Payer: 59

## 2020-08-09 ENCOUNTER — Ambulatory Visit (INDEPENDENT_AMBULATORY_CARE_PROVIDER_SITE_OTHER): Payer: 59

## 2020-08-09 DIAGNOSIS — Z3042 Encounter for surveillance of injectable contraceptive: Secondary | ICD-10-CM

## 2020-08-09 DIAGNOSIS — E538 Deficiency of other specified B group vitamins: Secondary | ICD-10-CM

## 2020-08-09 LAB — POCT URINE PREGNANCY: Preg Test, Ur: NEGATIVE

## 2020-08-09 MED ORDER — MEDROXYPROGESTERONE ACETATE 150 MG/ML IM SUSP
150.0000 mg | Freq: Once | INTRAMUSCULAR | Status: AC
  Administered 2020-08-09: 150 mg via INTRAMUSCULAR

## 2020-08-09 NOTE — Progress Notes (Signed)
Patient presented for Depo-Provera injection to right glute, patient voiced no concerns nor showed any signs of distress during injection. Next injection due 10-25-20 through 11-08-20.

## 2020-08-20 ENCOUNTER — Other Ambulatory Visit: Payer: Self-pay | Admitting: Internal Medicine

## 2020-08-20 ENCOUNTER — Other Ambulatory Visit: Payer: Self-pay

## 2020-08-20 ENCOUNTER — Ambulatory Visit
Admission: RE | Admit: 2020-08-20 | Discharge: 2020-08-20 | Disposition: A | Discharge status: Home / Self Care | Payer: 59 | Source: Ambulatory Visit | Attending: Internal Medicine | Admitting: Internal Medicine

## 2020-08-20 DIAGNOSIS — Z1231 Encounter for screening mammogram for malignant neoplasm of breast: Secondary | ICD-10-CM | POA: Insufficient documentation

## 2020-08-29 ENCOUNTER — Other Ambulatory Visit: Payer: Self-pay | Admitting: Internal Medicine

## 2020-09-04 ENCOUNTER — Other Ambulatory Visit: Payer: Self-pay | Admitting: Internal Medicine

## 2020-10-01 ENCOUNTER — Other Ambulatory Visit: Payer: Self-pay | Admitting: Internal Medicine

## 2020-10-02 ENCOUNTER — Ambulatory Visit: Payer: 59 | Admitting: Internal Medicine

## 2020-10-02 ENCOUNTER — Encounter: Payer: Self-pay | Admitting: Internal Medicine

## 2020-10-02 ENCOUNTER — Other Ambulatory Visit: Payer: Self-pay

## 2020-10-02 DIAGNOSIS — K219 Gastro-esophageal reflux disease without esophagitis: Secondary | ICD-10-CM

## 2020-10-02 DIAGNOSIS — E7800 Pure hypercholesterolemia, unspecified: Secondary | ICD-10-CM

## 2020-10-02 DIAGNOSIS — F419 Anxiety disorder, unspecified: Secondary | ICD-10-CM

## 2020-10-02 DIAGNOSIS — D329 Benign neoplasm of meninges, unspecified: Secondary | ICD-10-CM

## 2020-10-02 DIAGNOSIS — E039 Hypothyroidism, unspecified: Secondary | ICD-10-CM

## 2020-10-02 DIAGNOSIS — G4730 Sleep apnea, unspecified: Secondary | ICD-10-CM

## 2020-10-02 DIAGNOSIS — E041 Nontoxic single thyroid nodule: Secondary | ICD-10-CM | POA: Diagnosis not present

## 2020-10-02 DIAGNOSIS — M059 Rheumatoid arthritis with rheumatoid factor, unspecified: Secondary | ICD-10-CM | POA: Diagnosis not present

## 2020-10-02 DIAGNOSIS — K449 Diaphragmatic hernia without obstruction or gangrene: Secondary | ICD-10-CM

## 2020-10-02 DIAGNOSIS — I10 Essential (primary) hypertension: Secondary | ICD-10-CM

## 2020-10-02 DIAGNOSIS — Z6834 Body mass index (BMI) 34.0-34.9, adult: Secondary | ICD-10-CM

## 2020-10-02 DIAGNOSIS — Z9109 Other allergy status, other than to drugs and biological substances: Secondary | ICD-10-CM

## 2020-10-02 MED ORDER — DEXLANSOPRAZOLE 60 MG PO CPDR: 60.0000 mg | DELAYED_RELEASE_CAPSULE | Freq: Every day | ORAL | 2 refills | Status: DC

## 2020-10-02 NOTE — Progress Notes (Signed)
Patient ID: Tina Parker, female   DOB: 04-29-1971, 50 y.o.   MRN: [id number]   Subjective:    Patient ID: Tina Parker, female    DOB: 08/13/1970, 50 y.o.   MRN: [id number]  HPI This visit occurred during the SARS-CoV-2 public health emergency.  Safety protocols were in place, including screening questions prior to the visit, additional usage of staff PPE, and extensive cleaning of exam room while observing appropriate contact time as indicated for disinfecting solutions.  Patient here for a work in appt.  Work in to discuss weight loss and treatment options. She is watching her diet.  Is walking and trying to exercise.  No chest pain or sob with increased activity or exertion.  Taking dexilant. Still has issues with swallowing - breads, thick meats.  Seeing GI.  Recent motility study - distal esophageal acid exposure was abnormal secondary to excessive distal esophageal acid exposure in recumbent posture, abnormal number of gastroesophageal reflux events off PPI.  On dexilant. Change to  29m.  Saw Dr FMargart Sicklesto discuss hiatal hernia repair with nissen fundoplication.  They had tentatively discussed - fall 2022.  On discussion today, she is wanting to hold on surgery.  Eating slowly, taking small bites and chewing food well.  Is interested in weight loss  - weight loss in general, but also wants to see if this helps her symptoms.  Discussed weight loss treatment options.  No chest congestion.  Breathing stable.  No vomiting.  Bowels stable.    Past Medical History:  Diagnosis Date  . Allergic rhinitis   . Anemia   . Arthritis    RA  . Carpal tunnel syndrome   . Dysphagia   . Esophageal spasm 01/16/2014  . GERD (gastroesophageal reflux disease)   . Graves disease    remission, no ablation, positive medical treatment  . History of hiatal hernia   . Hypertension   . Migraine headache    migraines  . 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 Pure hypercholesterolemia   .  Rheumatoid arthritis(714.0)    positive anti CCP antibodies, positive RF, oligo-articular, MTX  . Sciatica    Past Surgical History:  Procedure Laterality Date  . CARPAL TUNNEL RELEASE Right   . CESAREAN SECTION  1996  . COLONOSCOPY  03/14/2002, 08/16/2007, 03/07/2013   [facility]-father  . COLONOSCOPY WITH PROPOFOL N/A 06/21/2018   Procedure: COLONOSCOPY WITH PROPOFOL;  Surgeon: EManya Silvas MD;  [facility];  Service: Endoscopy;  Laterality: N/A;  . CRANIOTOMY Left 06/25/2017   Procedure: CRANIOTOMY TEMPORAL LEFT FOR TUMOR RESECTION;  Surgeon: CAshok Pall MD;  [facility]  Service: Neurosurgery;  Laterality: Left;  . ESOPHAGOGASTRODUODENOSCOPY  03/14/2002, 03/07/2013  . ESOPHAGOGASTRODUODENOSCOPY (EGD) WITH PROPOFOL N/A 06/21/2018   Procedure: ESOPHAGOGASTRODUODENOSCOPY (EGD) WITH PROPOFOL;  Surgeon: EManya Silvas MD;  [facility];  Service: Endoscopy;  Laterality: N/A;  . ESOPHAGOGASTRODUODENOSCOPY (EGD) WITH PROPOFOL N/A 07/15/2019   Procedure: ESOPHAGOGASTRODUODENOSCOPY (EGD) WITH PROPOFOL;  Surgeon: AJonathon Bellows MD;  [facility];  Service: Gastroenterology;  Laterality: N/A;  . Post Septoplasty and turbinate reduction  2007  . TRIGGER FINGER RELEASE     Family History  Problem Relation Age of Onset  . Cancer Mother        Breast Cancer  . Breast cancer Mother 357 . Cancer Father        Colon Cancer  . Heart disease Father  Hx of MI  . Hypertension Father   . Diabetes Father   . Hyperlipidemia Father   . Cancer Maternal Grandmother        lung cancer  . Cancer Maternal Uncle        esophageal   Social History   Socioeconomic History  . Marital status: Married    Spouse name: Not on file  . Number of children: 2  . Years of education: Not on file  . Highest education level: Not on file  Occupational History  . Occupation: Hairstylist  Tobacco Use  . Smoking status: Never Smoker  . Smokeless tobacco: Never Used   Vaping Use  . Vaping Use: Never used  Substance and Sexual Activity  . Alcohol use: Yes    Alcohol/week: 0.0 standard drinks    Comment: wine cooler 2 drinks 2x a week  . Drug use: No  . Sexual activity: Not on file  Other Topics Concern  . Not on file  Social History Narrative   Hairdresser    Social Determinants of Health   Financial Resource Strain: Not on file  Food Insecurity: Not on file  Transportation Needs: Not on file  Physical Activity: Not on file  Stress: Not on file  Social Connections: Not on file    Outpatient Encounter Medications as of 10/02/2020  Medication Sig  . dexlansoprazole (DEXILANT) 60 MG capsule Take 1 capsule (60 mg total) by mouth daily.  Marland Kitchen albuterol (PROVENTIL HFA;VENTOLIN HFA) 108 (90 BASE) MCG/ACT inhaler Inhale 2 puffs into the lungs every 6 (six) hours as needed for wheezing or shortness of breath.  Marland Kitchen azelastine (ASTELIN) 0.1 % nasal spray Place 2 sprays into both nostrils 2 (two) times daily. Use in each nostril as directed  . budesonide-formoterol (SYMBICORT) 80-4.5 MCG/ACT inhaler 1-2 puffs twice daily (rinse mouth after use)  . chlorthalidone (HYGROTON) 25 MG tablet TAKE 1/2 TABLET BY MOUTH EVERY DAY  . citalopram (CELEXA) 40 MG tablet TAKE 1 TABLET(40 MG) BY MOUTH DAILY  . folic acid (FOLVITE) 1 MG tablet   . inFLIXimab-axxq (AVSOLA) 100 MG SOLR Avsola 100 mg intravenous solution  Inject by intravenous route.  Marland Kitchen levocetirizine (XYZAL) 5 MG tablet TAKE 1 TABLET BY MOUTH EVERY DAY IN THE EVENING  . losartan (COZAAR) 100 MG tablet TAKE 1 TABLET(100 MG) BY MOUTH DAILY  . medroxyPROGESTERone (DEPO-PROVERA) 150 MG/ML injection Inject 150 mg into the muscle every 3 (three) months.   . methotrexate (RHEUMATREX) 2.5 MG tablet Take 22.5 mg by mouth every Monday. Caution:Chemotherapy. Protect from light. Take 9 tabs po weekly with meals  . montelukast (SINGULAIR) 10 MG tablet take 1 tablet by mouth at bedtime  . QVAR REDIHALER 80 MCG/ACT inhaler  SMARTSIG:2 Puff(s) By Mouth Twice Daily  . valACYclovir (VALTREX) 500 MG tablet Take one tablet bid x 3 days.  May repeat for flares as directed.  . [DISCONTINUED] DEXILANT 30 MG capsule TAKE 1 CAPSULE(30 MG) BY MOUTH DAILY   Facility-Administered Encounter Medications as of 10/02/2020  Medication  . betamethasone acetate-betamethasone sodium phosphate (CELESTONE) injection 12 mg    Review of Systems  Constitutional: Negative for appetite change and unexpected weight change.       Interested in weight loss.   HENT: Negative for congestion and sinus pressure.   Respiratory: Negative for cough, chest tightness and shortness of breath.   Cardiovascular: Negative for chest pain, palpitations and leg swelling.  Gastrointestinal: Negative for abdominal pain, diarrhea, nausea and vomiting.  Dysphagia as outlined.   Genitourinary: Negative for difficulty urinating and dysuria.  Musculoskeletal: Negative for myalgias.       Joints relatively stable.   Skin: Negative for color change and rash.  Neurological: Negative for dizziness, light-headedness and headaches.  Psychiatric/Behavioral: Negative for agitation and dysphoric mood.       Objective:    Physical Exam Vitals reviewed.  Constitutional:      General: She is not in acute distress.    Appearance: Normal appearance.  HENT:     Head: Normocephalic and atraumatic.     Right Ear: External ear normal.     Left Ear: External ear normal.  Eyes:     General: No scleral icterus.       Right eye: No discharge.        Left eye: No discharge.     Conjunctiva/sclera: Conjunctivae normal.  Neck:     Thyroid: No thyromegaly.  Cardiovascular:     Rate and Rhythm: Normal rate and regular rhythm.  Pulmonary:     Effort: No respiratory distress.     Breath sounds: Normal breath sounds. No wheezing.  Abdominal:     General: Bowel sounds are normal.     Palpations: Abdomen is soft.     Tenderness: There is no abdominal tenderness.   Musculoskeletal:        General: No swelling or tenderness.     Cervical back: Neck supple. No tenderness.  Lymphadenopathy:     Cervical: No cervical adenopathy.  Skin:    Findings: No erythema or rash.  Neurological:     Mental Status: She is alert.  Psychiatric:        Mood and Affect: Mood normal.        Behavior: Behavior normal.     BP 114/70   Pulse 100   Temp (!) 96.4 F (35.8 C) (Temporal)   Resp 16   Ht 5' 1"  (1.549 m)   Wt 183 lb 6.4 oz (83.2 kg)   SpO2 99%   BMI 34.65 kg/m  Wt Readings from Last 3 Encounters:  10/02/20 183 lb 6.4 oz (83.2 kg)  07/24/20 183 lb 6.4 oz (83.2 kg)  05/02/20 191 lb (86.6 kg)     Lab Results  Component Value Date   WBC 4.5 03/12/2020   HGB 12.7 03/12/2020   HCT 38.0 03/12/2020   PLT 262.0 03/12/2020   GLUCOSE 94 07/13/2020   CHOL 176 07/13/2020   TRIG 76.0 07/13/2020   HDL 51.80 07/13/2020   LDLCALC 109 (H) 07/13/2020   ALT 13 07/13/2020   AST 18 07/13/2020   NA 137 07/13/2020   K 4.1 07/13/2020   CL 104 07/13/2020   CREATININE 1.00 07/13/2020   BUN 16 07/13/2020   CO2 26 07/13/2020   TSH 1.59 04/09/2020   HGBA1C 5.7 07/13/2020    MM 3D SCREEN BREAST BILATERAL  Result Date: 08/22/2020 CLINICAL DATA:  Screening. EXAM: DIGITAL SCREENING BILATERAL MAMMOGRAM WITH TOMOSYNTHESIS AND CAD TECHNIQUE: Bilateral screening digital craniocaudal and mediolateral oblique mammograms were obtained. Bilateral screening digital breast tomosynthesis was performed. The images were evaluated with computer-aided detection. COMPARISON:  Previous exam(s). ACR Breast Density Category c: The breast tissue is heterogeneously dense, which may obscure small masses. FINDINGS: There are no findings suspicious for malignancy. IMPRESSION: No mammographic evidence of malignancy. A result letter of this screening mammogram will be mailed directly to the patient. RECOMMENDATION: Screening mammogram in one year. (Code:SM-B-01Y) BI-RADS CATEGORY  1:  Negative. Electronically Signed  By: Fidela Salisbury M.D.   On: 08/22/2020 15:22       Assessment & Plan:   Problem List Items Addressed This Visit    Anxiety    Doing well on citalopram.  Appears to be doing better.  Follow.       BMI 34.0-34.9,adult    Discussed importance of continued diet and exercise.  Discussed weight loss options.  She is interested in looking into wegovy, etc. CCM referral.  Follow.       Relevant Orders   AMB Referral to [facility]   Environmental allergies    Seeing an allergies.  Continue singulair, xyzal and flonase.  Follow.       Essential hypertension, benign    Continue losartan and chlorthalidone.  Pressure doing well.  Follow pressures.  Follow metabolic panel.        GERD (gastroesophageal reflux disease)    Symptoms as outlined.  Change dose - 75m dexilant.  Follow.       Relevant Medications   dexlansoprazole (DEXILANT) 60 MG capsule   Other Relevant Orders   AMB Referral to [facility]   Hiatal hernia    Recent evaluation as outlined.  Met with surgery.  Discussed plans for hiatal hernia surgery and nissen fundoplication.  She desires to hold at this time. Wants to try weight loss and follow.  Continue chewing food well, eating slowly and taking small bites.        Relevant Orders   AMB Referral to [facility]ordin[facility]   Hypercholesterolemia    Calculated cholesterol risk - 3%.  Low cholesterol diet and exercise. Follow lipid panel.       Hypothyroidism    On thyroid replacement.  Follow tsh.       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      Mild sleep apnea    CPAP.       Rheumatoid arthritis ([facility]    Followed by rheumatology.  On MTX.  Also receiving avsola.  Joints appear to be stable.       Thyroid nodule    S/p FNA - Dr SGabriel Carina1/12/20 - benign. Reevaluated 08/2019.  Recommended f/u 6 months to one year.  Will need  to confirm f/u.           CEinar Pheasant MD

## 2020-10-05 ENCOUNTER — Ambulatory Visit: Payer: 59 | Admitting: Internal Medicine

## 2020-10-07 ENCOUNTER — Encounter: Payer: Self-pay | Admitting: Internal Medicine

## 2020-10-07 NOTE — Assessment & Plan Note (Signed)
S/p FNA - Dr Gabriel Carina 07/11/18 - benign. Reevaluated 08/2019.  Recommended f/u 6 months to one year.  Will need to confirm f/u.

## 2020-10-07 NOTE — Assessment & Plan Note (Signed)
Seeing an allergies.  Continue singulair, xyzal and flonase.  Follow.

## 2020-10-07 NOTE — Assessment & Plan Note (Signed)
Calculated cholesterol risk - 3%.  Low cholesterol diet and exercise. Follow lipid panel.

## 2020-10-07 NOTE — Assessment & Plan Note (Signed)
CPAP.  

## 2020-10-07 NOTE — Assessment & Plan Note (Signed)
Discussed importance of continued diet and exercise.  Discussed weight loss options.  She is interested in looking into wegovy, etc. CCM referral.  Follow.

## 2020-10-07 NOTE — Assessment & Plan Note (Signed)
Followed by Dr Cabbell - [facility].   

## 2020-10-07 NOTE — Assessment & Plan Note (Signed)
Doing well on citalopram.  Appears to be doing better.  Follow.

## 2020-10-07 NOTE — Assessment & Plan Note (Signed)
Symptoms as outlined.  Change dose - 60mg  dexilant.  Follow.

## 2020-10-07 NOTE — Assessment & Plan Note (Signed)
Continue losartan and chlorthalidone.  Pressure doing well.  Follow pressures.  Follow metabolic panel.

## 2020-10-07 NOTE — Assessment & Plan Note (Signed)
On thyroid replacement.  Follow tsh.  

## 2020-10-07 NOTE — Assessment & Plan Note (Signed)
Recent evaluation as outlined.  Met with surgery.  Discussed plans for hiatal hernia surgery and nissen fundoplication.  She desires to hold at this time. Wants to try weight loss and follow.  Continue chewing food well, eating slowly and taking small bites.   

## 2020-10-07 NOTE — Assessment & Plan Note (Signed)
Followed by rheumatology.  On MTX.  Also receiving avsola.  Joints appear to be stable.

## 2020-10-09 ENCOUNTER — Telehealth: Payer: Self-pay

## 2020-10-09 NOTE — Chronic Care Management (AMB) (Signed)
  Care Management   Note  10/09/2020 Name: Tina Parker MRN: [id number] DOB: 1971-03-18  Tina Parker is a 50 y.o. year old female who is a primary care patient of Einar Pheasant, MD. I reached out to [name] by phone today in response to a referral sent by Ms. Shaquanda H Stennett's health plan.    Ms. Vazquez was given information about care management services today including:  1. Care management services include personalized support from designated clinical staff supervised by her physician, including individualized plan of care and coordination with other care providers 2. 24/7 contact phone numbers for assistance for urgent and routine care needs. 3. The patient may stop care management services at any time by phone call to the office staff.  Patient agreed to services and verbal consent obtained.   Follow up plan: Telephone appointment with care management team member scheduled for:11/01/2020  Noreene Larsson, [place], [place], [place] [address] Direct Dial: [phone] [email or web address]

## 2020-10-22 ENCOUNTER — Other Ambulatory Visit: Payer: Self-pay | Admitting: Otolaryngology

## 2020-10-22 DIAGNOSIS — R609 Edema, unspecified: Secondary | ICD-10-CM

## 2020-10-22 DIAGNOSIS — R221 Localized swelling, mass and lump, neck: Secondary | ICD-10-CM

## 2020-10-23 ENCOUNTER — Encounter: Payer: Self-pay | Admitting: Internal Medicine

## 2020-10-24 MED ORDER — VALACYCLOVIR HCL 500 MG PO TABS: ORAL_TABLET | ORAL | 0 refills | Status: DC

## 2020-10-24 NOTE — Telephone Encounter (Signed)
rx sent in for valtrex.

## 2020-10-30 ENCOUNTER — Ambulatory Visit (INDEPENDENT_AMBULATORY_CARE_PROVIDER_SITE_OTHER): Payer: 59

## 2020-10-30 ENCOUNTER — Ambulatory Visit: Payer: 59

## 2020-10-30 ENCOUNTER — Other Ambulatory Visit: Payer: Self-pay

## 2020-10-30 DIAGNOSIS — Z3042 Encounter for surveillance of injectable contraceptive: Secondary | ICD-10-CM | POA: Diagnosis not present

## 2020-10-30 MED ORDER — MEDROXYPROGESTERONE ACETATE 150 MG/ML IM SUSP
150.0000 mg | Freq: Once | INTRAMUSCULAR | Status: AC
  Administered 2020-10-30: 150 mg via INTRAMUSCULAR

## 2020-10-30 NOTE — Progress Notes (Addendum)
Patient presented for Depo-Provera injection to left deltoid, patient voiced no concerns nor showed any signs of distress during injection. Next injection due July 19- January 29, 2021

## 2020-10-31 ENCOUNTER — Ambulatory Visit
Admission: RE | Admit: 2020-10-31 | Discharge: 2020-10-31 | Disposition: A | Discharge status: Home / Self Care | Payer: 59 | Source: Ambulatory Visit | Attending: Otolaryngology | Admitting: Otolaryngology

## 2020-10-31 DIAGNOSIS — R221 Localized swelling, mass and lump, neck: Secondary | ICD-10-CM

## 2020-10-31 DIAGNOSIS — R609 Edema, unspecified: Secondary | ICD-10-CM

## 2020-10-31 MED ORDER — IOPAMIDOL (ISOVUE-300) INJECTION 61%
75.0000 mL | Freq: Once | INTRAVENOUS | Status: AC | PRN
  Administered 2020-10-31: 75 mL via INTRAVENOUS

## 2020-11-01 ENCOUNTER — Ambulatory Visit: Payer: 59 | Admitting: Pharmacist

## 2020-11-01 DIAGNOSIS — I10 Essential (primary) hypertension: Secondary | ICD-10-CM

## 2020-11-01 DIAGNOSIS — M059 Rheumatoid arthritis with rheumatoid factor, unspecified: Secondary | ICD-10-CM

## 2020-11-01 DIAGNOSIS — E7800 Pure hypercholesterolemia, unspecified: Secondary | ICD-10-CM

## 2020-11-01 DIAGNOSIS — Z6834 Body mass index (BMI) 34.0-34.9, adult: Secondary | ICD-10-CM

## 2020-11-01 DIAGNOSIS — R7301 Impaired fasting glucose: Secondary | ICD-10-CM

## 2020-11-01 MED ORDER — SEMAGLUTIDE-WEIGHT MANAGEMENT 2.4 MG/0.75ML ~~LOC~~ SOAJ: 2.4000 mg | SUBCUTANEOUS | 1 refills | Status: DC

## 2020-11-01 MED ORDER — OZEMPIC (2 MG/DOSE) 8 MG/3ML ~~LOC~~ SOPN: 0.2500 mg | PEN_INJECTOR | SUBCUTANEOUS | 1 refills | Status: DC

## 2020-11-01 MED ORDER — SEMAGLUTIDE-WEIGHT MANAGEMENT 1 MG/0.5ML ~~LOC~~ SOAJ: 1.0000 mg | SUBCUTANEOUS | 0 refills | Status: DC

## 2020-11-01 MED ORDER — OZEMPIC (0.25 OR 0.5 MG/DOSE) 2 MG/1.5ML ~~LOC~~ SOPN: PEN_INJECTOR | SUBCUTANEOUS | 0 refills | Status: DC

## 2020-11-01 MED ORDER — SEMAGLUTIDE-WEIGHT MANAGEMENT 0.25 MG/0.5ML ~~LOC~~ SOAJ: 0.2500 mg | SUBCUTANEOUS | 0 refills | Status: DC

## 2020-11-01 MED ORDER — SEMAGLUTIDE-WEIGHT MANAGEMENT 0.5 MG/0.5ML ~~LOC~~ SOAJ: 0.5000 mg | SUBCUTANEOUS | 0 refills | Status: DC

## 2020-11-01 MED ORDER — SEMAGLUTIDE-WEIGHT MANAGEMENT 1.7 MG/0.75ML ~~LOC~~ SOAJ: 1.7000 mg | SUBCUTANEOUS | 0 refills | Status: DC

## 2020-11-01 NOTE — Patient Instructions (Addendum)
Eddis,   It was great talking to you today!  We will start with this sample of Ozempic. Inject 0.25 mg weekly for 4 weeks, then increase to 0.5 mg weekly. This will last you just over 2 months. At that time, we can see if we need to prescribe the 1 mg dose of Ozempic, or if the 1 mg dose of Wegovy is available.   You can visit the [facility] or Ozempic websites for information about how to inject and more information about the medication.   Call me with any questions or concerns!  Catie Darnelle Maffucci, PharmD [phone]  Visit Information  Goals Addressed              This Visit's Progress     Patient Stated   .  Disease Management (pt-stated)        Patient Goals/Self-Care Activities . Over the next 90 days, patient will:  - take medications as prescribed target a minimum of 150 minutes of moderate intensity exercise weekly engage in dietary modifications by moderation in carbohydrate portion sizes        Print copy of patient instructions, educational materials, and care plan provided in person. Plan: Telephone follow up appointment with care management team member scheduled for:  ~ 6 weeks  Catie Darnelle Maffucci, PharmD, [place], [place] Clinical Pharmacist [employer] [facility] [employer] [phone]

## 2020-11-01 NOTE — Chronic Care Management (AMB) (Addendum)
Care Management   Pharmacy Note  11/01/2020 Name: Tina Parker MRN: [id number] DOB: Apr 29, 1971  Subjective: Tina Parker is a 50 y.o. year old female who is a primary care patient of Einar Pheasant, MD. The Care Management team was consulted for assistance with care management and care coordination needs.    Engaged with patient by telephone for initial visit in response to provider referral for pharmacy case management and/or care coordination services.   The patient was given information about Care Management services today including:  1. Care Management services includes personalized support from designated clinical staff supervised by the patient's primary care provider, including individualized plan of care and coordination with other care providers. 2. 24/7 contact phone numbers for assistance for urgent and routine care needs. 3. The patient may stop case management services at any time by phone call to the office staff.  Patient agreed to services and consent obtained.  Assessment:  Review of patient status, including review of consultants reports, laboratory and other test data, was performed as part of comprehensive evaluation and provision of chronic care management services.   SDOH (Social Determinants of Health) assessments and interventions performed:  SDOH Interventions   Flowsheet Row Most Recent Value  SDOH Interventions   Food Insecurity Interventions Intervention Not Indicated  Financial Strain Interventions Intervention Not Indicated  Physical Activity Interventions Other (Comments)       Objective:  Lab Results  Component Value Date   CREATININE 1.00 07/13/2020   CREATININE 0.95 02/01/2020   CREATININE 0.95 02/01/2020    Lab Results  Component Value Date   HGBA1C 5.7 07/13/2020       Component Value Date/Time   CHOL 176 07/13/2020 0815   TRIG 76.0 07/13/2020 0815   HDL 51.80 07/13/2020 0815   CHOLHDL 3 07/13/2020 0815   VLDL 15.2  07/13/2020 0815   LDLCALC 109 (H) 07/13/2020 0815    Clinical ASCVD: No  The 10-year ASCVD risk score Mikey Bussing DC Jr., et al., 2013) is: 3.6%   Values used to calculate the score:     Age: 55 years     Sex: Female     Is Non-Hispanic African American: Yes     Diabetic: No     Tobacco smoker: No     Systolic Blood Pressure: 401 mmHg     Is BP treated: Yes     HDL Cholesterol: 51.8 mg/dL     Total Cholesterol: 176 mg/dL      BP Readings from Last 3 Encounters:  10/02/20 114/70  07/24/20 122/70  05/02/20 (!) 146/96    Care Plan  Allergies  Allergen Reactions  . Inh [Isoniazid] Other (See Comments)    Causes hepatitis    Medications Reviewed Today    Reviewed by De Hollingshead, RPH-CPP (Pharmacist) on 11/01/20 at 80  Med List Status: <None>  Medication Order Taking? Sig Documenting Provider Last Dose Status Informant  albuterol (PROVENTIL HFA;VENTOLIN HFA) 108 (90 BASE) MCG/ACT inhaler [id number] No Inhale 2 puffs into the lungs every 6 (six) hours as needed for wheezing or shortness of breath.  Patient not taking: Reported on 11/01/2020   Einar Pheasant, MD Not Taking Active Self  azelastine (ASTELIN) 0.1 % nasal spray [id number] Yes Place 2 sprays into both nostrils 2 (two) times daily. Use in each nostril as directed Julian Hy, DO Taking Active   betamethasone acetate-betamethasone sodium phosphate (CELESTONE) injection 12 mg [id number]   Magnus Sinning, MD  Active   budesonide-formoterol [facility]) 80-4.5  MCG/ACT inhaler [id number] Yes 1-2 puffs twice daily (rinse mouth after use) Martyn Ehrich, NP Taking Active            Med Note Nat Christen Nov 01, 2020  1:04 PM) Taking once daily   chlorthalidone (HYGROTON) 25 MG tablet [id number] Yes TAKE 1/2 TABLET BY MOUTH EVERY DAY Einar Pheasant, MD Taking Active   citalopram (CELEXA) 40 MG tablet [id number] Yes TAKE 1 TABLET(40 MG) BY MOUTH DAILY Einar Pheasant, MD Taking Active   dexlansoprazole  (DEXILANT) 60 MG capsule [id number] Yes Take 1 capsule (60 mg total) by mouth daily. Einar Pheasant, MD Taking Active   folic acid (FOLVITE) 1 MG tablet [id number] Yes Take 1 mg by mouth daily. [provider] Taking Active   inFLIXimab-axxq (AVSOLA) 100 MG SOLR [id number] Yes Avsola 100 mg intravenous solution  Inject by intravenous route. [provider] Taking Active   levocetirizine (XYZAL) 5 MG tablet [id number] Yes TAKE 1 TABLET BY MOUTH EVERY DAY IN THE Donald Prose, Randell Patient, MD Taking Active   losartan (COZAAR) 100 MG tablet [id number] Yes TAKE 1 TABLET(100 MG) BY MOUTH DAILY Einar Pheasant, MD Taking Active   medroxyPROGESTERone (DEPO-PROVERA) 150 MG/ML injection [id number] Yes Inject 150 mg into the muscle every 3 (three) months.  [provider] Taking Active Self           Med Note Nat Christen Nov 01, 2020  1:05 PM)    methotrexate (RHEUMATREX) 2.5 MG tablet [id number] Yes Take 22.5 mg by mouth every Monday. Caution:Chemotherapy. Protect from light. Take 9 tabs po weekly with meals [provider] Taking Active Self  montelukast (SINGULAIR) 10 MG tablet [id number] Yes take 1 tablet by mouth at bedtime Einar Pheasant, MD Taking Active   valACYclovir (VALTREX) 500 MG tablet [id number] No Take one tablet bid x 3 days.  May repeat for flares as directed.  Patient not taking: Reported on 11/01/2020   Einar Pheasant, MD Not Taking Active           Patient Active Problem List   Diagnosis Date Noted  . Wrist pain, right 06/24/2020  . Acquired trigger finger of left little finger 12/12/2019  . Pain of left hand 10/17/2019  . Chronic venous insufficiency 09/28/2019  . Allergic rhinitis 09/26/2019  . Carpal tunnel syndrome 09/26/2019  . Hiatal hernia 09/26/2019  . Hx of migraine headaches 09/26/2019  . Positive PPD 09/26/2019  . Leg pain 09/12/2019  . Mild sleep apnea 08/08/2019  . Thyroid nodule 05/18/2019  . Thyroid fullness 05/01/2019   . Bilateral wrist pain 02/21/2019  . Radial styloid tenosynovitis 10/27/2018  . Anxiety 06/08/2018  . Sinusitis 06/08/2018  . History of meningioma of the brain 12/21/2017  . Herpes 07/23/2017  . 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 ([place]) 06/25/2017  . Pain of both hip joints 05/18/2017  . Vitamin D deficiency 09/21/2016  . Cough 01/07/2015  . Difficulty sleeping 01/07/2015  . Health care maintenance 08/02/2014  . Snoring 03/19/2014  . Dysphagia 01/15/2014  . BMI 34.0-34.9,adult 12/11/2013  . Skin lesion 12/11/2013  . Family history of breast cancer 10/22/2013  . Environmental allergies 10/22/2013  . Essential hypertension, benign 09/15/2012  . Hypercholesterolemia 09/15/2012  . Rheumatoid arthritis ([place]) 09/15/2012  . GERD (gastroesophageal reflux disease) 09/15/2012  . Family history of colon cancer 09/15/2012  . Anemia 09/15/2012  . Hypothyroidism 09/15/2012    Conditions to be addressed/monitored: HTN and Obesity, RA  Care Plan :  Medication Management  Updates made by De Hollingshead, RPH-CPP since 11/01/2020 12:00 AM    Problem: Obesity, HTN, GERD, depression     Long-Range Goal: Disease Progression Prevention   Start Date: 11/01/2020  This Visit's Progress: On track  Priority: High  Note:   Current Barriers:  . Unable to achieve control of weight   Pharmacist Clinical Goal(s):  Marland Kitchen Over the next 90 days, patient will achieve goal weight loss of 5-10% from baseline through collaboration with PharmD and provider.   Interventions: . 1:1 collaboration with Einar Pheasant, MD regarding development and update of comprehensive plan of care as evidenced by provider attestation and co-signature . Inter-disciplinary care team collaboration (see longitudinal plan of care) . Comprehensive medication review performed; medication list updated in electronic medical record  Obesity: . Unable to achieve sustained weight loss with diet and exercise alone;  current treatment: none;  . Current meal patterns: breakfast: yogurt + fruit; oatmeal, occasionally hot cake, bacon, toast; cutting back on biscuits, getting english muffin instead; lunch: variable; sometimes eats snack instead: deli meat w/ whole grain pocket, lays; medium pepsi; drinks: more sodas lately, otherwise drinks water; supper: variable, doesn't cook a lot during the week due to work schedule, trying to meal prep more, but ends up getting quick take out; has been focusing on good choices with her husband and daughter  . Current exercise: limited by RA pain recently, but is doing better with stiffness now that she is on a new regimen; plans to get back into physical activity. Walks a dog.  Marland Kitchen Extensive dietary and lifestyle education provided. Discussed minimization of sodas, sweets, complex carbohydrates, focus on lean proteins, fresh fruits and vegetables, whole grains, increased fiber and hydration.  Marland Kitchen Extensive discussion regarding GLP1. Discussed mechanism of action, benefit, side effects. Patient has a history of a thyroid nodule deemed to be likely benign. No immediate family history of thyroid cancer. Discussed with Dr. Jasmine December, she does not have reservations for the use of GLP1 in this patient. Patient also counseled to stop the medication and contact our office if any GI pain indicative of gallbladder disease . Will pursue insurance authorization for [employer]. PA not needed. Contacted the pharmacy, her copay for [facility] is $18/month. Unfortunately, there is a back order on 0.25, 0.5, and 1 mg doses right now. Contacted patient. PCP and patient both agreeable to off label use of Ozempic. Will start semaglutide therapy using Ozempic. Will provide sample. Start Ozempic 0.25 mg once weekly for 4 weeks, then increase to 0.5 mg weekly. Will evaluate [employer] access at follow up appointments.   Hypertension: . Controlled; current treatment: chlorthalidone 12.5 mg daily, losartan 100 mg  daily . Continue current regimen at this time  Asthma/allergies: . Controlled; current treatment: Albuterol HFA PRN, Symbicort 80/4.5 1-2 puffs PRN, azelastine 0.1 % nasal spray PRN, levocetirizine 5 mg daily, montelukast 10 mg daily . Continue current regimen at this time  Depression: . Controlled per patient report; current treatment: citalopram 40 mg daily . Reviewed that SSRIs can increase risk of weight gain, but appropriate to continue if benefit outweighs risk.  . Continue current regimen at this time  Rheumatoid Arthritis: . Improving per patient report; current regimen: infliximab IV infusion, methotrexate 09.7 mg weekly, folic acid 1 mg daily; follows w/ Dr. Jefm Bryant . Reports improvement in joint pain as she continues on infliximab . Discussed impact of prednisone on weight. Hopefully success on infliximab therapy will allow for prednisone dose reduction moving forward.  Marland Kitchen  Continue collaboration with rheumatology.   Contraception: . Managed; current regimen: medroxyprogesterone IM Q3 months . Discussed that medroxyprogesterone may increase risk of weight gain, though if benefits outweigh risk of weight gain, appropriate to continue therapy . Continue current regimen at this time  GERD/hiatal hernia: . Moderately well managed; current regimen: dexlansoprazole 60 mg daily, referred to [facility] for consideration of weight management surgery vs hiatal hernia surgery. Patient would like to avoid surgery by pharmacologic management of weight.  . Continue current regimen at this time   Patient Goals/Self-Care Activities . Over the next 90 days, patient will:  - take medications as prescribed target a minimum of 150 minutes of moderate intensity exercise weekly engage in dietary modifications by moderation in carbohydrate portion sizes  Follow Up Plan: Telephone follow up appointment with care management team member scheduled for: pending medication access plan     Medication  Assistance:  None required.  Patient affirms current coverage meets needs.  Follow Up:  Patient agrees to Care Plan and Follow-up.  Plan: Telephone follow up appointment with care management team member scheduled for:  ~ 6 weeks  Catie Darnelle Maffucci, PharmD, [place], [place] Clinical Pharmacist [employer] [facility] [employer] [phone]

## 2020-11-01 NOTE — Addendum Note (Signed)
Addended by: De Hollingshead on: 11/01/2020 04:15 PM   Modules accepted: Orders

## 2020-11-02 ENCOUNTER — Other Ambulatory Visit: Payer: 59

## 2020-11-19 ENCOUNTER — Other Ambulatory Visit: Payer: Self-pay

## 2020-11-19 ENCOUNTER — Other Ambulatory Visit (INDEPENDENT_AMBULATORY_CARE_PROVIDER_SITE_OTHER): Payer: 59

## 2020-11-19 DIAGNOSIS — R739 Hyperglycemia, unspecified: Secondary | ICD-10-CM

## 2020-11-19 DIAGNOSIS — E7800 Pure hypercholesterolemia, unspecified: Secondary | ICD-10-CM

## 2020-11-19 DIAGNOSIS — M059 Rheumatoid arthritis with rheumatoid factor, unspecified: Secondary | ICD-10-CM | POA: Diagnosis not present

## 2020-11-19 DIAGNOSIS — I10 Essential (primary) hypertension: Secondary | ICD-10-CM | POA: Diagnosis not present

## 2020-11-19 LAB — CBC WITH DIFFERENTIAL/PLATELET
Basophils Absolute: 0 10*3/uL (ref 0.0–0.1)
Basophils Relative: 0.7 % (ref 0.0–3.0)
Eosinophils Absolute: 0.1 10*3/uL (ref 0.0–0.7)
Eosinophils Relative: 1.6 % (ref 0.0–5.0)
HCT: 34.5 % — ABNORMAL LOW (ref 36.0–46.0)
Hemoglobin: 11.9 g/dL — ABNORMAL LOW (ref 12.0–15.0)
Lymphocytes Relative: 22.7 % (ref 12.0–46.0)
Lymphs Abs: 1.2 10*3/uL (ref 0.7–4.0)
MCHC: 34.5 g/dL (ref 30.0–36.0)
MCV: 93.9 fl (ref 78.0–100.0)
Monocytes Absolute: 0.3 10*3/uL (ref 0.1–1.0)
Monocytes Relative: 6.2 % (ref 3.0–12.0)
Neutro Abs: 3.6 10*3/uL (ref 1.4–7.7)
Neutrophils Relative %: 68.8 % (ref 43.0–77.0)
Platelets: 334 10*3/uL (ref 150.0–400.0)
RBC: 3.67 Mil/uL — ABNORMAL LOW (ref 3.87–5.11)
RDW: 14.9 % (ref 11.5–15.5)
WBC: 5.3 10*3/uL (ref 4.0–10.5)

## 2020-11-19 LAB — BASIC METABOLIC PANEL
BUN: 14 mg/dL (ref 6–23)
CO2: 25 mEq/L (ref 19–32)
Calcium: 9.9 mg/dL (ref 8.4–10.5)
Chloride: 103 mEq/L (ref 96–112)
Creatinine, Ser: 1.02 mg/dL (ref 0.40–1.20)
GFR: 63.63 mL/min (ref >60.00)
Glucose, Bld: 98 mg/dL (ref 70–99)
Potassium: 3.8 mEq/L (ref 3.5–5.1)
Sodium: 137 mEq/L (ref 135–145)

## 2020-11-19 LAB — HEPATIC FUNCTION PANEL
ALT: 16 U/L (ref 0–35)
AST: 22 U/L (ref 0–37)
Albumin: 3.9 g/dL (ref 3.5–5.2)
Alkaline Phosphatase: 93 U/L (ref 39–117)
Bilirubin, Direct: 0.1 mg/dL (ref 0.0–0.3)
Total Bilirubin: 0.6 mg/dL (ref 0.2–1.2)
Total Protein: 8 g/dL (ref 6.0–8.3)

## 2020-11-19 LAB — LIPID PANEL
Cholesterol: 162 mg/dL (ref 0–200)
HDL: 41.6 mg/dL (ref >39.00)
LDL Cholesterol: 101 mg/dL — ABNORMAL HIGH (ref 0–99)
NonHDL: 120.56
Total CHOL/HDL Ratio: 4
Triglycerides: 98 mg/dL (ref 0.0–149.0)
VLDL: 19.6 mg/dL (ref 0.0–40.0)

## 2020-11-19 LAB — HEMOGLOBIN A1C: Hgb A1c MFr Bld: 5.9 % (ref 4.6–6.5)

## 2020-11-22 ENCOUNTER — Other Ambulatory Visit (HOSPITAL_COMMUNITY)
Admission: RE | Admit: 2020-11-22 | Discharge: 2020-11-22 | Disposition: A | Discharge status: Home / Self Care | Payer: 59 | Source: Ambulatory Visit | Attending: Internal Medicine | Admitting: Internal Medicine

## 2020-11-22 ENCOUNTER — Other Ambulatory Visit: Payer: Self-pay

## 2020-11-22 ENCOUNTER — Ambulatory Visit (INDEPENDENT_AMBULATORY_CARE_PROVIDER_SITE_OTHER): Payer: 59 | Admitting: Internal Medicine

## 2020-11-22 VITALS — BP 118/70 | HR 90 | Temp 97.1°F | Resp 16 | Ht 61.0 in | Wt 180.0 lb

## 2020-11-22 DIAGNOSIS — J309 Allergic rhinitis, unspecified: Secondary | ICD-10-CM

## 2020-11-22 DIAGNOSIS — I10 Essential (primary) hypertension: Secondary | ICD-10-CM

## 2020-11-22 DIAGNOSIS — Z124 Encounter for screening for malignant neoplasm of cervix: Secondary | ICD-10-CM | POA: Insufficient documentation

## 2020-11-22 DIAGNOSIS — R1310 Dysphagia, unspecified: Secondary | ICD-10-CM

## 2020-11-22 DIAGNOSIS — E039 Hypothyroidism, unspecified: Secondary | ICD-10-CM

## 2020-11-22 DIAGNOSIS — G4730 Sleep apnea, unspecified: Secondary | ICD-10-CM

## 2020-11-22 DIAGNOSIS — E041 Nontoxic single thyroid nodule: Secondary | ICD-10-CM

## 2020-11-22 DIAGNOSIS — F419 Anxiety disorder, unspecified: Secondary | ICD-10-CM

## 2020-11-22 DIAGNOSIS — D649 Anemia, unspecified: Secondary | ICD-10-CM

## 2020-11-22 DIAGNOSIS — G5602 Carpal tunnel syndrome, left upper limb: Secondary | ICD-10-CM

## 2020-11-22 DIAGNOSIS — K219 Gastro-esophageal reflux disease without esophagitis: Secondary | ICD-10-CM

## 2020-11-22 DIAGNOSIS — Z0000 Encounter for general adult medical examination without abnormal findings: Secondary | ICD-10-CM

## 2020-11-22 DIAGNOSIS — E7800 Pure hypercholesterolemia, unspecified: Secondary | ICD-10-CM

## 2020-11-22 DIAGNOSIS — D329 Benign neoplasm of meninges, unspecified: Secondary | ICD-10-CM

## 2020-11-22 DIAGNOSIS — M059 Rheumatoid arthritis with rheumatoid factor, unspecified: Secondary | ICD-10-CM

## 2020-11-22 MED ORDER — LOSARTAN POTASSIUM 100 MG PO TABS: ORAL_TABLET | ORAL | 2 refills | Status: DC

## 2020-11-22 MED ORDER — CHLORTHALIDONE 25 MG PO TABS: 12.5000 mg | ORAL_TABLET | Freq: Every day | ORAL | 1 refills | Status: DC

## 2020-11-22 NOTE — Assessment & Plan Note (Signed)
Physical today 11/22/20.  Mammogram 08/22/20 - Birads I.  PAP 11/22/20.  Colonoscopy 05/2018 - internal hemorrhoids.  Recommend f/u colonoscopy in 05/2023.

## 2020-11-22 NOTE — Progress Notes (Signed)
Patient ID: Tina Parker, female   DOB: [birth year], 50 y.o.   MRN: [id number]   Subjective:    Patient ID: Tina Parker, female    DOB: 1971-05-08, 50 y.o.   MRN: [id number]  HPI This visit occurred during the SARS-CoV-2 public health emergency.  Safety protocols were in place, including screening questions prior to the visit, additional usage of staff PPE, and extensive cleaning of exam room while observing appropriate contact time as indicated for disinfecting solutions.  Patient here for her physical exam.  She reports she is doing relatively well.  Has been seeing Dr Amedeo Plenty - s/p right wrist scaphoid fracture and f/u left CTS.  Stable. Last evaluated 10/25/20.  Recommended f/u n 3 months.  Also evaluated by GI and is a candidate for laparoscopic fundoplication and hiatal hernia repair.  She prefers to hold on this at this time.  Has had issues with dry mouth and some swelling - side of face.  No stone (ENT).  Some better.  Eating.  No nausea or vomiting reported.  No chest pain or sob reported.  No abdominal pain.  Bowels moving.    Past Medical History:  Diagnosis Date  . Allergic rhinitis   . Anemia   . Arthritis    RA  . Carpal tunnel syndrome   . Dysphagia   . Esophageal spasm 01/16/2014  . GERD (gastroesophageal reflux disease)   . Graves disease    remission, no ablation, positive medical treatment  . History of hiatal hernia   . Hypertension   . Migraine headache    migraines  . PPD positive    hepatitis secondary to [place]  . Pure hypercholesterolemia   . Rheumatoid arthritis(714.0)    positive anti CCP antibodies, positive RF, oligo-articular, MTX  . Sciatica    Past Surgical History:  Procedure Laterality Date  . CARPAL TUNNEL RELEASE Right   . CESAREAN SECTION  1996  . COLONOSCOPY  03/14/2002, 08/16/2007, 03/07/2013   [facility]-father  . COLONOSCOPY WITH PROPOFOL N/A 06/21/2018   Procedure: COLONOSCOPY WITH PROPOFOL;  Surgeon: Manya Silvas, MD;  Location: [facility]  ENDOSCOPY;  Service: Endoscopy;  Laterality: N/A;  . CRANIOTOMY Left 06/25/2017   Procedure: CRANIOTOMY TEMPORAL LEFT FOR TUMOR RESECTION;  Surgeon: Ashok Pall, MD;  Location: [facility];  Service: Neurosurgery;  Laterality: Left;  . ESOPHAGOGASTRODUODENOSCOPY  03/14/2002, 03/07/2013  . ESOPHAGOGASTRODUODENOSCOPY (EGD) WITH PROPOFOL N/A 06/21/2018   Procedure: ESOPHAGOGASTRODUODENOSCOPY (EGD) WITH PROPOFOL;  Surgeon: Manya Silvas, MD;  Location: [facility] ENDOSCOPY;  Service: Endoscopy;  Laterality: N/A;  . ESOPHAGOGASTRODUODENOSCOPY (EGD) WITH PROPOFOL N/A 07/15/2019   Procedure: ESOPHAGOGASTRODUODENOSCOPY (EGD) WITH PROPOFOL;  Surgeon: Jonathon Bellows, MD;  Location: [facility] ENDOSCOPY;  Service: Gastroenterology;  Laterality: N/A;  . Post Septoplasty and turbinate reduction  2007  . TRIGGER FINGER RELEASE     Family History  Problem Relation Age of Onset  . Cancer Mother        Breast Cancer  . Breast cancer Mother 29  . Cancer Father        Colon Cancer  . Heart disease Father        Hx of MI  . Hypertension Father   . Diabetes Father   . Hyperlipidemia Father   . Cancer Maternal Grandmother        lung cancer  . Cancer Maternal Uncle        esophageal   Social History   Socioeconomic History  . Marital status: Married    Spouse name:  Not on file  . Number of children: 2  . Years of education: Not on file  . Highest education level: Not on file  Occupational History  . Occupation: Hairstylist  Tobacco Use  . Smoking status: Never Smoker  . Smokeless tobacco: Never Used  Vaping Use  . Vaping Use: Never used  Substance and Sexual Activity  . Alcohol use: Yes    Alcohol/week: 0.0 standard drinks    Comment: wine cooler 2 drinks 2x a week  . Drug use: No  . Sexual activity: Not on file  Other Topics Concern  . Not on file  Social History Narrative   Hairdresser    Social Determinants of Health   Financial Resource Strain: Low Risk   . Difficulty of Paying Living Expenses:  Not hard at all  Food Insecurity: No Food Insecurity  . Worried About Charity fundraiser in the Last Year: Never true  . Ran Out of Food in the Last Year: Never true  Transportation Needs: Not on file  Physical Activity: Insufficiently Active  . Days of Exercise per Week: 2 days  . Minutes of Exercise per Session: 20 min  Stress: Not on file  Social Connections: Not on file    Outpatient Encounter Medications as of 11/22/2020  Medication Sig  . azelastine (ASTELIN) 0.1 % nasal spray Place 2 sprays into both nostrils 2 (two) times daily. Use in each nostril as directed  . budesonide-formoterol (SYMBICORT) 80-4.5 MCG/ACT inhaler 1-2 puffs twice daily (rinse mouth after use)  . chlorthalidone (HYGROTON) 25 MG tablet Take 0.5 tablets (12.5 mg total) by mouth daily.  . cholecalciferol (VITAMIN D3) 25 MCG (1000 UNIT) tablet Take 1,000 Units by mouth daily.  . citalopram (CELEXA) 40 MG tablet TAKE 1 TABLET(40 MG) BY MOUTH DAILY  . dexlansoprazole (DEXILANT) 60 MG capsule Take 1 capsule (60 mg total) by mouth daily.  . folic acid (FOLVITE) 1 MG tablet Take 1 mg by mouth daily.  Marland Kitchen inFLIXimab-axxq (AVSOLA) 100 MG SOLR Avsola 100 mg intravenous solution  Inject by intravenous route.  Marland Kitchen levocetirizine (XYZAL) 5 MG tablet TAKE 1 TABLET BY MOUTH EVERY DAY IN THE EVENING  . losartan (COZAAR) 100 MG tablet TAKE 1 TABLET(100 MG) BY MOUTH DAILY  . medroxyPROGESTERone (DEPO-PROVERA) 150 MG/ML injection Inject 150 mg into the muscle every 3 (three) months.   . methotrexate (RHEUMATREX) 2.5 MG tablet Take 22.5 mg by mouth every Monday. Caution:Chemotherapy. Protect from light. Take 9 tabs po weekly with meals  . montelukast (SINGULAIR) 10 MG tablet take 1 tablet by mouth at bedtime  . Semaglutide, 2 MG/DOSE, (OZEMPIC, 2 MG/DOSE,) 8 MG/3ML SOPN Inject 0.25-1 mg into the skin once a week. Inject 1.61 mg weekly (10 clicks) for 4 weeks, then increase to 0.5 mg (19 clicks) weekly for 4 weeks. After that, increase  to 1 mg weekly (37 clicks).  Derrill Memo ON 12/29/2020] Semaglutide-Weight Management 1 MG/0.5ML SOAJ Inject 1 mg into the skin once a week for 28 days.  Derrill Memo ON 01/27/2021] Semaglutide-Weight Management 1.7 MG/0.75ML SOAJ Inject 1.7 mg into the skin once a week for 28 days.  Derrill Memo ON 02/25/2021] Semaglutide-Weight Management 2.4 MG/0.75ML SOAJ Inject 2.4 mg into the skin once a week for 28 days.  . vitamin B-12 (CYANOCOBALAMIN) 100 MCG tablet Take 100 mcg by mouth daily.  . [DISCONTINUED] albuterol (PROVENTIL HFA;VENTOLIN HFA) 108 (90 BASE) MCG/ACT inhaler Inhale 2 puffs into the lungs every 6 (six) hours as needed for wheezing or  shortness of breath. (Patient not taking: Reported on 11/01/2020)  . [DISCONTINUED] chlorthalidone (HYGROTON) 25 MG tablet TAKE 1/2 TABLET BY MOUTH EVERY DAY  . [DISCONTINUED] losartan (COZAAR) 100 MG tablet TAKE 1 TABLET(100 MG) BY MOUTH DAILY  . [DISCONTINUED] valACYclovir (VALTREX) 500 MG tablet Take one tablet bid x 3 days.  May repeat for flares as directed. (Patient not taking: Reported on 11/01/2020)   Facility-Administered Encounter Medications as of 11/22/2020  Medication  . betamethasone acetate-betamethasone sodium phosphate (CELESTONE) injection 12 mg    Review of Systems  Constitutional: Negative for appetite change and unexpected weight change.  HENT: Negative for congestion, sinus pressure and sore throat.   Eyes: Negative for pain and visual disturbance.  Respiratory: Negative for cough, chest tightness and shortness of breath.   Cardiovascular: Negative for chest pain, palpitations and leg swelling.  Gastrointestinal: Negative for abdominal pain, diarrhea, nausea and vomiting.  Genitourinary: Negative for difficulty urinating and dysuria.  Musculoskeletal: Negative for joint swelling and myalgias.  Skin: Negative for color change and rash.  Neurological: Negative for dizziness, light-headedness and headaches.  Hematological: Negative for adenopathy.  Does not bruise/bleed easily.  Psychiatric/Behavioral: Negative for agitation and dysphoric mood.       Objective:    Physical Exam Vitals reviewed.  Constitutional:      General: She is not in acute distress.    Appearance: Normal appearance. She is well-developed.  HENT:     Head: Normocephalic and atraumatic.     Right Ear: External ear normal.     Left Ear: External ear normal.  Eyes:     General: No scleral icterus.       Right eye: No discharge.        Left eye: No discharge.     Conjunctiva/sclera: Conjunctivae normal.  Neck:     Thyroid: No thyromegaly.  Cardiovascular:     Rate and Rhythm: Normal rate and regular rhythm.  Pulmonary:     Effort: No tachypnea, accessory muscle usage or respiratory distress.     Breath sounds: Normal breath sounds. No decreased breath sounds or wheezing.  Chest:  Breasts:     Right: No inverted nipple, mass, nipple discharge or tenderness (no axillary adenopathy).     Left: No inverted nipple, mass, nipple discharge or tenderness (no axilarry adenopathy).    Abdominal:     General: Bowel sounds are normal.     Palpations: Abdomen is soft.     Tenderness: There is no abdominal tenderness.  Genitourinary:    Comments: Normal external genitalia.  Vaginal vault without lesions.  Cervix identified.  Pap smear performed.  Could not appreciate any adnexal masses or tenderness.   Musculoskeletal:        General: No swelling or tenderness.     Cervical back: Neck supple. No tenderness.  Lymphadenopathy:     Cervical: No cervical adenopathy.  Skin:    Findings: No erythema or rash.  Neurological:     Mental Status: She is alert and oriented to person, place, and time.  Psychiatric:        Mood and Affect: Mood normal.        Behavior: Behavior normal.     BP 118/70   Pulse 90   Temp (!) 97.1 F (36.2 C)   Resp 16   Ht 5\' 1"  (1.549 m)   Wt 180 lb (81.6 kg)   SpO2 99%   BMI 34.01 kg/m  Wt Readings from Last 3 Encounters:   11/22/20 180  lb (81.6 kg)  10/02/20 183 lb 6.4 oz (83.2 kg)  07/24/20 183 lb 6.4 oz (83.2 kg)     Lab Results  Component Value Date   WBC 5.3 11/19/2020   HGB 11.9 (L) 11/19/2020   HCT 34.5 (L) 11/19/2020   PLT 334.0 11/19/2020   GLUCOSE 98 11/19/2020   CHOL 162 11/19/2020   TRIG 98.0 11/19/2020   HDL 41.60 11/19/2020   LDLCALC 101 (H) 11/19/2020   ALT 16 11/19/2020   AST 22 11/19/2020   NA 137 11/19/2020   K 3.8 11/19/2020   CL 103 11/19/2020   CREATININE 1.02 11/19/2020   BUN 14 11/19/2020   CO2 25 11/19/2020   TSH 1.59 04/09/2020   HGBA1C 5.9 11/19/2020    CT SOFT TISSUE NECK W CONTRAST  Result Date: 11/01/2020 CLINICAL DATA:  Left parotid swelling for 1 month EXAM: CT NECK WITH CONTRAST TECHNIQUE: Multidetector CT imaging of the neck was performed using the standard protocol following the bolus administration of intravenous contrast. CONTRAST:  57mL ISOVUE-300 IOPAMIDOL (ISOVUE-300) INJECTION 61% COMPARISON:  None. FINDINGS: Pharynx and larynx: No evidence of mass or inflammation. Salivary glands: No inflammation, mass, or stone. Thyroid: The left lobe is larger than right. History of thyroid nodules most recently followed by ultrasound in 2021 Lymph nodes: None enlarged or abnormal density. Vascular: Scattered atheromatous calcifications Limited intracranial: Partially covered palpable marker over the left temporal fossa with no underlying asymmetry. There is a minimally visualized left-sided temporal craniotomy, reportedly for meningioma resection. No visualized pseudo meningocele. Encephalomalacia in the left middle cranial fossa. Visualized orbits: Negative Mastoids and visualized paranasal sinuses: Clear Skeleton: As above Upper chest: Negative IMPRESSION: Negative for mass or adenopathy. Symmetric normal appearance of the parotid glands. Electronically Signed   By: Monte Fantasia M.D.   On: 11/01/2020 07:52       Assessment & Plan:   Problem List Items Addressed This  Visit    Allergic rhinitis    Remains on xyzal, singulair and flonase/astelin.  Stable. Discussed antihistamine side effects.  Continue.        Anemia    hgb slightly decreased on recent labs.  Recheck cbc, B12 and iron studies.       Relevant Orders   CBC with Differential/Platelet   Vitamin B12   IBC + Ferritin   Anxiety    Has been doing well on citalopram.  Follow.       Carpal tunnel syndrome    S/p left carpal tunnel surgery - Dr Amedeo Plenty. Stable.       Dysphagia    Stable. Seeing GI as outlined.       Essential hypertension, benign    Continue losartan and chlorthalidone.  Blood pressure doing well.  Follow pressures.  Follow metabolic panel.       Relevant Medications   losartan (COZAAR) 100 MG tablet   chlorthalidone (HYGROTON) 25 MG tablet   GERD (gastroesophageal reflux disease)    Continue dexilant.  Seeing GI as outlined.       Health care maintenance    Physical today 11/22/20.  Mammogram 08/22/20 - Birads I.  PAP 11/22/20.  Colonoscopy 05/2018 - internal hemorrhoids.  Recommend f/u colonoscopy in 05/2023.        Hypercholesterolemia    The 10-year ASCVD risk score Mikey Bussing DC Jr., et al., 2013) is: 3.2%   Values used to calculate the score:     Age: 5 years     Sex: Female     Is  Non-Hispanic African American: Yes     Diabetic: No     Tobacco smoker: No     Systolic Blood Pressure: 664 mmHg     Is BP treated: Yes     HDL Cholesterol: 41.6 mg/dL     Total Cholesterol: 162 mg/dL  Low cholesterol diet and exercise.  Follow lipid panel.       Relevant Medications   losartan (COZAAR) 100 MG tablet   chlorthalidone (HYGROTON) 25 MG tablet   Hypothyroidism    On thyroid replacement.  Follow tsh.       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       Mild sleep apnea    CPAP.       Rheumatoid arthritis ([place])    Followed by rheumatology. On MTX.  Also receiving avsola.  Stable.        Thyroid nodule    S/p FNA - Dr Gabriel Carina 07/11/18 - benign.  Reevaluated 08/2019.  Recommended f/u 6 months to one year.         Other Visit Diagnoses    Routine general medical examination at a health care facility    -  Primary   Cervical cancer screening       Relevant Orders   Cytology - PAP( [place])       Einar Pheasant, MD

## 2020-11-27 ENCOUNTER — Encounter: Payer: Self-pay | Admitting: Internal Medicine

## 2020-11-27 NOTE — Assessment & Plan Note (Signed)
Continue dexilant.  Seeing GI as outlined.

## 2020-11-27 NOTE — Assessment & Plan Note (Signed)
On thyroid replacement.  Follow tsh.  

## 2020-11-27 NOTE — Assessment & Plan Note (Signed)
Followed by rheumatology. On MTX.  Also receiving avsola.  Stable.

## 2020-11-27 NOTE — Assessment & Plan Note (Addendum)
hgb slightly decreased on recent labs.  Recheck cbc, B12 and iron studies.

## 2020-11-27 NOTE — Assessment & Plan Note (Signed)
Followed by Dr Cabbell - [facility].   

## 2020-11-27 NOTE — Assessment & Plan Note (Signed)
Continue losartan and chlorthalidone.  Blood pressure doing well.  Follow pressures.  Follow metabolic panel.

## 2020-11-27 NOTE — Assessment & Plan Note (Signed)
S/p left carpal tunnel surgery - Dr Amedeo Plenty. Stable.

## 2020-11-27 NOTE — Assessment & Plan Note (Signed)
Has been doing well on citalopram.  Follow.

## 2020-11-27 NOTE — Assessment & Plan Note (Signed)
Stable. Seeing GI as outlined.

## 2020-11-27 NOTE — Assessment & Plan Note (Signed)
CPAP.  

## 2020-11-27 NOTE — Assessment & Plan Note (Signed)
S/p FNA - Dr Gabriel Carina 07/11/18 - benign.  Reevaluated 08/2019.  Recommended f/u 6 months to one year.

## 2020-11-27 NOTE — Assessment & Plan Note (Signed)
Remains on xyzal, singulair and flonase/astelin.  Stable. Discussed antihistamine side effects.  Continue.

## 2020-11-27 NOTE — Assessment & Plan Note (Signed)
The 10-year ASCVD risk score Mikey Bussing DC Brooke Bonito., et al., 2013) is: 3.2%   Values used to calculate the score:     Age: 49 years     Sex: Female     Is Non-Hispanic African American: Yes     Diabetic: No     Tobacco smoker: No     Systolic Blood Pressure: 786 mmHg     Is BP treated: Yes     HDL Cholesterol: 41.6 mg/dL     Total Cholesterol: 162 mg/dL  Low cholesterol diet and exercise.  Follow lipid panel.

## 2020-11-28 LAB — CYTOLOGY - PAP
Comment: NEGATIVE
Diagnosis: NEGATIVE
High risk HPV: NEGATIVE

## 2020-12-13 ENCOUNTER — Ambulatory Visit: Payer: 59 | Admitting: Pharmacist

## 2020-12-13 DIAGNOSIS — I10 Essential (primary) hypertension: Secondary | ICD-10-CM

## 2020-12-13 DIAGNOSIS — Z6834 Body mass index (BMI) 34.0-34.9, adult: Secondary | ICD-10-CM

## 2020-12-13 DIAGNOSIS — R7303 Prediabetes: Secondary | ICD-10-CM

## 2020-12-13 DIAGNOSIS — E7800 Pure hypercholesterolemia, unspecified: Secondary | ICD-10-CM

## 2020-12-13 MED ORDER — OZEMPIC (2 MG/DOSE) 8 MG/3ML ~~LOC~~ SOPN: 2.0000 mg | PEN_INJECTOR | SUBCUTANEOUS | 1 refills | Status: DC

## 2020-12-13 MED ORDER — OZEMPIC (2 MG/DOSE) 8 MG/3ML ~~LOC~~ SOPN: 2.0000 mg | PEN_INJECTOR | SUBCUTANEOUS | 2 refills | Status: DC

## 2020-12-13 MED ORDER — OZEMPIC (2 MG/DOSE) 8 MG/3ML ~~LOC~~ SOPN: PEN_INJECTOR | SUBCUTANEOUS | 0 refills | Status: DC

## 2020-12-13 NOTE — Chronic Care Management (AMB) (Signed)
Care Management   Pharmacy Note  12/13/2020 Name: Tina Parker MRN: [id number] DOB: [birth year]  Subjective: Tina Parker is a 50 y.o. year old female who is a primary care patient of Einar Pheasant, MD. The Care Management team was consulted for assistance with care management and care coordination needs.    Engaged with patient by telephone for follow up visit in response to provider referral for pharmacy case management and/or care coordination services.   The patient was given information about Care Management services today including:  Care Management services includes personalized support from designated clinical staff supervised by the patient's primary care provider, including individualized plan of care and coordination with other care providers. 24/7 contact phone numbers for assistance for urgent and routine care needs. The patient may stop case management services at any time by phone call to the office staff.  Patient agreed to services and consent obtained.  Assessment:  Review of patient status, including review of consultants reports, laboratory and other test data, was performed as part of comprehensive evaluation and provision of chronic care management services.   SDOH (Social Determinants of Health) assessments and interventions performed: none   Objective:  Lab Results  Component Value Date   CREATININE 1.02 11/19/2020   CREATININE 1.00 07/13/2020   CREATININE 0.95 02/01/2020   CREATININE 0.95 02/01/2020    Lab Results  Component Value Date   HGBA1C 5.9 11/19/2020       Component Value Date/Time   CHOL 162 11/19/2020 0806   TRIG 98.0 11/19/2020 0806   HDL 41.60 11/19/2020 0806   CHOLHDL 4 11/19/2020 0806   VLDL 19.6 11/19/2020 0806   LDLCALC 101 (H) 11/19/2020 0806    Clinical ASCVD: No  The 10-year ASCVD risk score Mikey Bussing DC Jr., et al., 2013) is: 2.1%   Values used to calculate the score:     Age: 50 years     Sex: Female     Is  Non-Hispanic African American: Yes     Diabetic: No     Tobacco smoker: No     Systolic Blood Pressure: 176 mmHg     Is BP treated: Yes     HDL Cholesterol: 41.6 mg/dL     Total Cholesterol: 162 mg/dL    BP Readings from Last 3 Encounters:  11/22/20 118/70  10/02/20 114/70  07/24/20 122/70    Care Plan  Allergies  Allergen Reactions   Inh [Isoniazid] Other (See Comments)    Causes hepatitis    Medications Reviewed Today     Reviewed by De Hollingshead, RPH-CPP (Pharmacist) on 12/13/20 at 1406  Med List Status: <None>   Medication Order Taking? Sig Documenting Provider Last Dose Status Informant  azelastine (ASTELIN) 0.1 % nasal spray [id number]  Place 2 sprays into both nostrils 2 (two) times daily. Use in each nostril as directed Julian Hy, DO  Active   betamethasone acetate-betamethasone sodium phosphate (CELESTONE) injection 12 mg [id number]   Magnus Sinning, MD  Active   budesonide-formoterol [facility]) 80-4.5 MCG/ACT inhaler [id number]  1-2 puffs twice daily (rinse mouth after use) Martyn Ehrich, NP  Active            Med Note Darnelle Maffucci, Maralyn Sago Nov 01, 2020  1:04 PM) Taking once daily   chlorthalidone (HYGROTON) 25 MG tablet [id number]  Take 0.5 tablets (12.5 mg total) by mouth daily. Einar Pheasant, MD  Active   cholecalciferol (VITAMIN D3) 25 MCG (1000 UNIT) tablet [id number]  Take 1,000 Units by mouth daily. [provider]  Active   citalopram (CELEXA) 40 MG tablet [id number]  TAKE 1 TABLET(40 MG) BY MOUTH DAILY Einar Pheasant, MD  Active   dexlansoprazole (DEXILANT) 60 MG capsule [id number]  Take 1 capsule (60 mg total) by mouth daily. Einar Pheasant, MD  Active   folic acid (FOLVITE) 1 MG tablet [id number]  Take 1 mg by mouth daily. [provider]  Active   inFLIXimab-axxq (AVSOLA) 100 MG SOLR [id number]  Avsola 100 mg intravenous solution  Inject by intravenous route. [provider]  Active   levocetirizine (XYZAL) 5  MG tablet [id number]  TAKE 1 TABLET BY MOUTH EVERY DAY IN THE Donald Prose, Randell Patient, MD  Active   losartan (COZAAR) 100 MG tablet [id number]  TAKE 1 TABLET(100 MG) BY MOUTH DAILY Einar Pheasant, MD  Active   medroxyPROGESTERone (DEPO-PROVERA) 150 MG/ML injection [id number]  Inject 150 mg into the muscle every 3 (three) months.  [provider]  Active Self           Med Note Nat Christen Nov 01, 2020  1:05 PM)    methotrexate (RHEUMATREX) 2.5 MG tablet [id number]  Take 22.5 mg by mouth every Monday. Caution:Chemotherapy. Protect from light. Take 9 tabs po weekly with meals [provider]  Active Self  montelukast (SINGULAIR) 10 MG tablet [id number]  take 1 tablet by mouth at bedtime Einar Pheasant, MD  Active   Semaglutide, 2 MG/DOSE, (OZEMPIC, 2 MG/DOSE,) 8 MG/3ML SOPN [id number] Yes Inject 0.25-1 mg into the skin once a week. Inject 9.73 mg weekly (10 clicks) for 4 weeks, then increase to 0.5 mg (19 clicks) weekly for 4 weeks. After that, increase to 1 mg weekly (37 clicks). Einar Pheasant, MD Taking Active   vitamin B-12 (CYANOCOBALAMIN) 100 MCG tablet [id number]  Take 100 mcg by mouth daily. [provider]  Active             Patient Active Problem List   Diagnosis Date Noted   Wrist pain, right 06/24/2020   Acquired trigger finger of left little finger 12/12/2019   Pain of left hand 10/17/2019   Chronic venous insufficiency 09/28/2019   Allergic rhinitis 09/26/2019   Carpal tunnel syndrome 09/26/2019   Hiatal hernia 09/26/2019   Hx of migraine headaches 09/26/2019   Positive PPD 09/26/2019   Leg pain 09/12/2019   Mild sleep apnea 08/08/2019   Thyroid nodule 05/18/2019   Thyroid fullness 05/01/2019   Bilateral wrist pain 02/21/2019   Radial styloid tenosynovitis 10/27/2018   Anxiety 06/08/2018   Sinusitis 06/08/2018   History of meningioma of the brain 12/21/2017   Herpes 07/23/2017   Meningioma of left sphenoid wing involving cavernous  sinus ([place]) 06/25/2017   Pain of both hip joints 05/18/2017   Vitamin D deficiency 09/21/2016   Cough 01/07/2015   Difficulty sleeping 01/07/2015   Health care maintenance 08/02/2014   Snoring 03/19/2014   Dysphagia 01/15/2014   BMI 34.0-34.9,adult 12/11/2013   Skin lesion 12/11/2013   Family history of breast cancer 10/22/2013   Environmental allergies 10/22/2013   Essential hypertension, benign 09/15/2012   Hypercholesterolemia 09/15/2012   Rheumatoid arthritis ([place]) 09/15/2012   GERD (gastroesophageal reflux disease) 09/15/2012   Family history of colon cancer 09/15/2012   Anemia 09/15/2012   Hypothyroidism 09/15/2012    Conditions to be addressed/monitored: HTN and GERD  Care Plan : Medication Management  Updates made by De Hollingshead, RPH-CPP since 12/13/2020 12:00 AM  Problem: Obesity, HTN, GERD, depression      Long-Range Goal: Disease Progression Prevention   Start Date: 11/01/2020  This Visit's Progress: On track  Recent Progress: On track  Priority: High  Note:   Current Barriers:  Unable to achieve control of weight   Pharmacist Clinical Goal(s):  Over the next 90 days, patient will achieve goal weight loss of 5-10% from baseline through collaboration with PharmD and provider.   Interventions: 1:1 collaboration with Einar Pheasant, MD regarding development and update of comprehensive plan of care as evidenced by provider attestation and co-signature Inter-disciplinary care team collaboration (see longitudinal plan of care) Comprehensive medication review performed; medication list updated in electronic medical record  Prediabetes, Obesity: Unable to achieve sustained weight loss with diet and exercise alone; current treatment: Ozempic 0.5 mg x 2 weeks Notes decreased appetite. No diarrhea, constipation, stomach pain. Baseline weight: 183 lbs (on office scales) Most recent weight: reports a 7 lbs weight loss Current meal patterns: breakfast: yogurt  + fruit; oatmeal, occasionally hot cake, bacon, toast; cutting back on biscuits, getting english muffin instead; lunch: variable; sometimes eats snack instead: deli meat w/ whole grain pocket, lays; medium pepsi; drinks: more sodas lately, otherwise drinks water; supper: variable, doesn't cook a lot during the week due to work schedule, trying to meal prep more, but ends up getting quick take out; has been focusing on good choices with her husband and daughter  Current exercise: limited by RA pain recently Complete total of 4 weeks of Ozempic 0.5 mg weekly. Then, increase to 1 mg weekly for a total of 4 weeks. Then, increase to 1.5 weekly for a total of 4 weeks. Then, increase to 2 mg weekly or we can switch to 2.4 mg Wegovy pen if available. Patient verbalizes understanding  Hypertension: Controlled; current treatment: chlorthalidone 12.5 mg daily, losartan 100 mg daily Previously recommended to continue current regimen at this time  Asthma/allergies: Controlled; current treatment: Albuterol HFA PRN, Symbicort 80/4.5 1-2 puffs PRN, azelastine 0.1 % nasal spray PRN, levocetirizine 5 mg daily, montelukast 10 mg daily Previously recommended to continue current regimen at this time  Depression: Controlled per patient report; current treatment: citalopram 40 mg daily Reviewed that SSRIs can increase risk of weight gain, but appropriate to continue if benefit outweighs risk.  Previously recommended to continue current regimen at this time  Rheumatoid Arthritis: Improving per patient report; current regimen: infliximab IV infusion, methotrexate 74.2 mg weekly, folic acid 1 mg daily; follows w/ Dr. Jefm Bryant Previously recommended to continue current regimen at this time along with collaboration with rheumatology.   Contraception: Managed; current regimen: medroxyprogesterone IM Q3 months Previously discussed that medroxyprogesterone may increase risk of weight gain, though if benefits outweigh risk  of weight gain, appropriate to continue therapy Previously recommended to continue current regimen at this time  GERD/hiatal hernia: Moderately well managed; current regimen: dexlansoprazole 60 mg daily, referred to [facility] for consideration of weight management surgery vs hiatal hernia surgery. Patient would like to avoid surgery by pharmacologic management of weight.  Previously recommended to continue current regimen at this time   Patient Goals/Self-Care Activities Over the next 90 days, patient will:  - take medications as prescribed target a minimum of 150 minutes of moderate intensity exercise weekly engage in dietary modifications by moderation in carbohydrate portion sizes  Follow Up Plan: Telephone follow up appointment with care management team member scheduled for: ~ 10 weeks     Medication Assistance:  None required.  Patient affirms  current coverage meets needs.  Follow Up:  Patient agrees to Care Plan and Follow-up.  Plan: Telephone follow up appointment with care management team member scheduled for:  ~ 10 weeks  Catie Darnelle Maffucci, PharmD, [place], [place] Clinical Pharmacist [employer] [facility] [employer] [phone]

## 2020-12-13 NOTE — Patient Instructions (Signed)
Visit Information  PATIENT GOALS:  Goals Addressed               This Visit's Progress     Patient Stated     Disease Management (pt-stated)        Patient Goals/Self-Care Activities Over the next 90 days, patient will:  - take medications as prescribed target a minimum of 150 minutes of moderate intensity exercise weekly engage in dietary modifications by moderation in carbohydrate portion sizes           Patient verbalizes understanding of instructions provided today and agrees to view in [place].   Plan: Telephone follow up appointment with care management team member scheduled for:  ~ 10 weeks  Catie Darnelle Maffucci, PharmD, [place], [place] Clinical Pharmacist [employer] [facility] [employer] [phone]

## 2020-12-14 ENCOUNTER — Other Ambulatory Visit: Payer: Self-pay | Admitting: Internal Medicine

## 2020-12-17 ENCOUNTER — Other Ambulatory Visit (INDEPENDENT_AMBULATORY_CARE_PROVIDER_SITE_OTHER): Payer: 59

## 2020-12-17 ENCOUNTER — Other Ambulatory Visit: Payer: Self-pay

## 2020-12-17 DIAGNOSIS — D649 Anemia, unspecified: Secondary | ICD-10-CM | POA: Diagnosis not present

## 2020-12-17 LAB — IBC + FERRITIN
Ferritin: 87.4 ng/mL (ref 10.0–291.0)
Iron: 63 ug/dL (ref 42–145)
Saturation Ratios: 19.3 % — ABNORMAL LOW (ref 20.0–50.0)
Transferrin: 233 mg/dL (ref 212.0–360.0)

## 2020-12-17 LAB — CBC WITH DIFFERENTIAL/PLATELET
Basophils Absolute: 0 10*3/uL (ref 0.0–0.1)
Basophils Relative: 0.8 % (ref 0.0–3.0)
Eosinophils Absolute: 0.1 10*3/uL (ref 0.0–0.7)
Eosinophils Relative: 1.2 % (ref 0.0–5.0)
HCT: 33.5 % — ABNORMAL LOW (ref 36.0–46.0)
Hemoglobin: 11.5 g/dL — ABNORMAL LOW (ref 12.0–15.0)
Lymphocytes Relative: 31.7 % (ref 12.0–46.0)
Lymphs Abs: 1.8 10*3/uL (ref 0.7–4.0)
MCHC: 34.5 g/dL (ref 30.0–36.0)
MCV: 92 fl (ref 78.0–100.0)
Monocytes Absolute: 0.5 10*3/uL (ref 0.1–1.0)
Monocytes Relative: 8.1 % (ref 3.0–12.0)
Neutro Abs: 3.3 10*3/uL (ref 1.4–7.7)
Neutrophils Relative %: 58.2 % (ref 43.0–77.0)
Platelets: 330 10*3/uL (ref 150.0–400.0)
RBC: 3.64 Mil/uL — ABNORMAL LOW (ref 3.87–5.11)
RDW: 14.7 % (ref 11.5–15.5)
WBC: 5.6 10*3/uL (ref 4.0–10.5)

## 2020-12-17 LAB — VITAMIN B12: Vitamin B-12: 1259 pg/mL — ABNORMAL HIGH (ref 211–911)

## 2020-12-20 ENCOUNTER — Encounter: Payer: Self-pay | Admitting: Internal Medicine

## 2020-12-23 ENCOUNTER — Other Ambulatory Visit: Payer: Self-pay | Admitting: Internal Medicine

## 2020-12-23 DIAGNOSIS — D649 Anemia, unspecified: Secondary | ICD-10-CM

## 2020-12-23 DIAGNOSIS — R739 Hyperglycemia, unspecified: Secondary | ICD-10-CM

## 2020-12-23 DIAGNOSIS — E039 Hypothyroidism, unspecified: Secondary | ICD-10-CM

## 2020-12-23 DIAGNOSIS — E7800 Pure hypercholesterolemia, unspecified: Secondary | ICD-10-CM

## 2020-12-23 DIAGNOSIS — I10 Essential (primary) hypertension: Secondary | ICD-10-CM

## 2020-12-23 NOTE — Progress Notes (Signed)
Orders placed for f/u labs.  

## 2020-12-26 ENCOUNTER — Encounter: Payer: Self-pay | Admitting: Internal Medicine

## 2021-01-14 ENCOUNTER — Other Ambulatory Visit: Payer: Self-pay | Admitting: Internal Medicine

## 2021-01-14 ENCOUNTER — Telehealth: Payer: Self-pay | Admitting: Internal Medicine

## 2021-01-14 NOTE — Telephone Encounter (Signed)
[facility] [place] Key: [id number] - PA Case ID: [id number] - Rx #: [id number] PA for Dexilant 60 Mg capsule started.

## 2021-01-17 NOTE — Telephone Encounter (Signed)
Checked the prior authorization for Dexilant, It has been denied. Called and spoke to [place], [place] asks that the Prior Authorization be resubmitted or put to appeal so she can receive the medication.  Denied PA Key: [id number]

## 2021-01-21 ENCOUNTER — Telehealth: Payer: Self-pay | Admitting: Gastroenterology

## 2021-01-21 ENCOUNTER — Other Ambulatory Visit: Payer: Self-pay

## 2021-01-21 ENCOUNTER — Ambulatory Visit (INDEPENDENT_AMBULATORY_CARE_PROVIDER_SITE_OTHER): Payer: 59 | Admitting: *Deleted

## 2021-01-21 DIAGNOSIS — Z3042 Encounter for surveillance of injectable contraceptive: Secondary | ICD-10-CM | POA: Diagnosis not present

## 2021-01-21 MED ORDER — MEDROXYPROGESTERONE ACETATE 150 MG/ML IM SUSP: 150.0000 mg | Freq: Once | INTRAMUSCULAR | Status: DC

## 2021-01-21 MED ORDER — MEDROXYPROGESTERONE ACETATE 150 MG/ML IM SUSP
150.0000 mg | Freq: Once | INTRAMUSCULAR | Status: AC
Start: 2021-01-21 — End: 2021-01-21
  Administered 2021-01-21: 150 mg via INTRAMUSCULAR

## 2021-01-21 NOTE — Progress Notes (Addendum)
Patient presented for Depo-Provera injection to left glute, patient voiced no concerns nor showed any signs of distress during injection. Next injection due Oct 10 - Apr 22 2021

## 2021-01-21 NOTE — Telephone Encounter (Signed)
Contacted pt to advise Dr Vicente Males will refill her [place] but pt's PCP refilled on 01/14/21 with 2 refills. Advised pt when she is ready for a refill to have the pharmacy to send a request.

## 2021-01-21 NOTE — Telephone Encounter (Signed)
Yes happy to provide refills on Dexilant

## 2021-01-21 NOTE — Telephone Encounter (Signed)
Spoke with patient. She is going to contact Dr Georgeann Oppenheim office. I used key below to try to appeal and it does not give me the option to appeal or resubmit this PA.

## 2021-01-21 NOTE — Telephone Encounter (Signed)
Patient wants to know if Dr. Vicente Males will continue to refill dexlansoprazole (DEXILANT) 60 MG capsule. Clinical staff will follow up with patient.

## 2021-01-22 ENCOUNTER — Telehealth: Payer: Self-pay

## 2021-01-22 MED ORDER — DEXLANSOPRAZOLE 60 MG PO CPDR: 60.0000 mg | DELAYED_RELEASE_CAPSULE | Freq: Every day | ORAL | 2 refills | Status: DC

## 2021-01-22 NOTE — Telephone Encounter (Signed)
PA has been submitted for Dexlansoprazole '60mg'$  capsules. Waiting on Determination.  Key [id number]

## 2021-01-22 NOTE — Telephone Encounter (Signed)
Patient thinks her Dexilant was sent to the wrong pharmacy. Patient wants to know what pharmacy it was sent to

## 2021-01-22 NOTE — Telephone Encounter (Signed)
A new prescription of Dexlansoprazole has been ordered by Dr. Vicente Males.

## 2021-01-23 ENCOUNTER — Encounter: Payer: Self-pay | Admitting: Internal Medicine

## 2021-01-23 NOTE — Telephone Encounter (Signed)
Patient called me back and I told her that when I called her insurance to ask how I could get her medication approved, they had told me that patient did not have insurance. So then the patient called back and spoke with a representative and was able to fix the problem. Now they are stating that she would need to try one of the following medications first in order for her to get some type of treatment: Lansoprazole, Pantoprazole, Omeprazole or Esomeprazole. Can you please advise Dr. Vicente Males which one I could send to her pharmacy. Then I will call the patient to let her know that her prescription was sent to her [facility] pharmacy.

## 2021-01-24 ENCOUNTER — Other Ambulatory Visit: Payer: Self-pay

## 2021-01-24 MED ORDER — OMEPRAZOLE 40 MG PO CPDR: 40.0000 mg | DELAYED_RELEASE_CAPSULE | Freq: Two times a day (BID) | ORAL | 3 refills | Status: DC

## 2021-01-24 NOTE — Telephone Encounter (Signed)
Pt notified Dr. Vicente Males approved Omeprazole '40mg'$  BID. Rx sent to [facility] per pt request.

## 2021-01-25 NOTE — Telephone Encounter (Signed)
Called patient to let her know that PA was denied. Patient advised that appeal is not needed. Dr Vicente Males sent her in something else and it has already been filled.

## 2021-02-11 ENCOUNTER — Other Ambulatory Visit: Payer: Self-pay | Admitting: Internal Medicine

## 2021-02-27 ENCOUNTER — Other Ambulatory Visit: Payer: Self-pay

## 2021-02-27 ENCOUNTER — Other Ambulatory Visit: Payer: Self-pay | Admitting: Critical Care Medicine

## 2021-02-27 ENCOUNTER — Other Ambulatory Visit (INDEPENDENT_AMBULATORY_CARE_PROVIDER_SITE_OTHER): Payer: 59

## 2021-02-27 DIAGNOSIS — E7800 Pure hypercholesterolemia, unspecified: Secondary | ICD-10-CM

## 2021-02-27 DIAGNOSIS — I10 Essential (primary) hypertension: Secondary | ICD-10-CM | POA: Diagnosis not present

## 2021-02-27 DIAGNOSIS — E039 Hypothyroidism, unspecified: Secondary | ICD-10-CM | POA: Diagnosis not present

## 2021-02-27 DIAGNOSIS — R739 Hyperglycemia, unspecified: Secondary | ICD-10-CM | POA: Diagnosis not present

## 2021-02-27 DIAGNOSIS — D649 Anemia, unspecified: Secondary | ICD-10-CM

## 2021-02-27 LAB — CBC WITH DIFFERENTIAL/PLATELET
Basophils Absolute: 0.1 10*3/uL (ref 0.0–0.1)
Basophils Relative: 0.9 % (ref 0.0–3.0)
Eosinophils Absolute: 0 10*3/uL (ref 0.0–0.7)
Eosinophils Relative: 0.7 % (ref 0.0–5.0)
HCT: 32.3 % — ABNORMAL LOW (ref 36.0–46.0)
Hemoglobin: 11.3 g/dL — ABNORMAL LOW (ref 12.0–15.0)
Lymphocytes Relative: 25.6 % (ref 12.0–46.0)
Lymphs Abs: 1.4 10*3/uL (ref 0.7–4.0)
MCHC: 34.9 g/dL (ref 30.0–36.0)
MCV: 98.7 fl (ref 78.0–100.0)
Monocytes Absolute: 0.8 10*3/uL (ref 0.1–1.0)
Monocytes Relative: 15.1 % — ABNORMAL HIGH (ref 3.0–12.0)
Neutro Abs: 3.1 10*3/uL (ref 1.4–7.7)
Neutrophils Relative %: 57.7 % (ref 43.0–77.0)
Platelets: 378 10*3/uL (ref 150.0–400.0)
RBC: 3.27 Mil/uL — ABNORMAL LOW (ref 3.87–5.11)
RDW: 14.2 % (ref 11.5–15.5)
WBC: 5.4 10*3/uL (ref 4.0–10.5)

## 2021-02-27 LAB — HEPATIC FUNCTION PANEL
ALT: 15 U/L (ref 0–35)
AST: 19 U/L (ref 0–37)
Albumin: 3.7 g/dL (ref 3.5–5.2)
Alkaline Phosphatase: 89 U/L (ref 39–117)
Bilirubin, Direct: 0.1 mg/dL (ref 0.0–0.3)
Total Bilirubin: 0.5 mg/dL (ref 0.2–1.2)
Total Protein: 7 g/dL (ref 6.0–8.3)

## 2021-02-27 LAB — LIPID PANEL
Cholesterol: 168 mg/dL (ref 0–200)
HDL: 41.5 mg/dL (ref >39.00)
LDL Cholesterol: 110 mg/dL — ABNORMAL HIGH (ref 0–99)
NonHDL: 126.89
Total CHOL/HDL Ratio: 4
Triglycerides: 85 mg/dL (ref 0.0–149.0)
VLDL: 17 mg/dL (ref 0.0–40.0)

## 2021-02-27 LAB — BASIC METABOLIC PANEL
BUN: 10 mg/dL (ref 6–23)
CO2: 27 mEq/L (ref 19–32)
Calcium: 9.8 mg/dL (ref 8.4–10.5)
Chloride: 103 mEq/L (ref 96–112)
Creatinine, Ser: 1.24 mg/dL — ABNORMAL HIGH (ref 0.40–1.20)
GFR: 50.24 mL/min — ABNORMAL LOW (ref >60.00)
Glucose, Bld: 81 mg/dL (ref 70–99)
Potassium: 3.8 mEq/L (ref 3.5–5.1)
Sodium: 137 mEq/L (ref 135–145)

## 2021-02-27 LAB — HEMOGLOBIN A1C: Hgb A1c MFr Bld: 5.7 % (ref 4.6–6.5)

## 2021-02-27 LAB — FERRITIN: Ferritin: 141.1 ng/mL (ref 10.0–291.0)

## 2021-02-27 LAB — TSH: TSH: 2.38 u[IU]/mL (ref 0.35–5.50)

## 2021-02-28 ENCOUNTER — Other Ambulatory Visit: Payer: Self-pay | Admitting: Internal Medicine

## 2021-02-28 ENCOUNTER — Telehealth: Payer: Self-pay

## 2021-02-28 DIAGNOSIS — Z6834 Body mass index (BMI) 34.0-34.9, adult: Secondary | ICD-10-CM

## 2021-02-28 DIAGNOSIS — R944 Abnormal results of kidney function studies: Secondary | ICD-10-CM

## 2021-02-28 DIAGNOSIS — R7301 Impaired fasting glucose: Secondary | ICD-10-CM

## 2021-02-28 NOTE — Telephone Encounter (Signed)
Orders placed for BMP and UA for future labs

## 2021-03-01 ENCOUNTER — Ambulatory Visit: Payer: 59 | Admitting: Internal Medicine

## 2021-03-09 ENCOUNTER — Other Ambulatory Visit: Payer: Self-pay | Admitting: Internal Medicine

## 2021-03-10 ENCOUNTER — Other Ambulatory Visit: Payer: Self-pay

## 2021-03-10 ENCOUNTER — Ambulatory Visit
Admission: RE | Admit: 2021-03-10 | Discharge: 2021-03-10 | Disposition: A | Discharge status: Home / Self Care | Payer: 59 | Source: Ambulatory Visit | Attending: Emergency Medicine | Admitting: Emergency Medicine

## 2021-03-10 VITALS — BP 106/77 | HR 100 | Temp 98.9°F | Resp 20

## 2021-03-10 DIAGNOSIS — B349 Viral infection, unspecified: Secondary | ICD-10-CM | POA: Diagnosis not present

## 2021-03-10 LAB — POCT RAPID STREP A (OFFICE): Rapid Strep A Screen: NEGATIVE

## 2021-03-10 NOTE — Discharge Instructions (Addendum)
Your rapid strep test is negative.     Your COVID test is pending.  You should self quarantine until the test result is back.    Take Tylenol or ibuprofen as needed for fever or discomfort.  Rest and keep yourself hydrated.    Follow-up with your primary care provider if your symptoms are not improving.     

## 2021-03-10 NOTE — ED Triage Notes (Signed)
Pt here with post nasal drip, left ear pain and sore throat for a week. Multiple negative at-home covid tests.

## 2021-03-10 NOTE — ED Provider Notes (Signed)
[name]    CSN: [id number] Arrival date & time: 03/10/21  [id number]      History   Chief Complaint Chief Complaint  Patient presents with   Nasal Congestion   Sore Throat   Otalgia    HPI Tina Parker is a 50 y.o. female.  Patient presents with 1 week history of ear pain, sore throat, postnasal drip, occasional mild nonproductive cough.  She denies fever, chills, rash, shortness of breath, vomiting, diarrhea, or other symptoms.  Several OTC treatments attempted without relief.  Her medical history includes rheumatoid arthritis, hypertension, seasonal allergies, migraine headaches, chronic venous insufficiency, Graves' disease.  The history is provided by the patient and medical records.   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   Pure hypercholesterolemia    Rheumatoid arthritis(714.0)    positive anti CCP antibodies, positive RF, oligo-articular, MTX   Sciatica     Patient Active Problem List   Diagnosis Date Noted   Wrist pain, right 06/24/2020   Acquired trigger finger of left little finger 12/12/2019   Pain of left hand 10/17/2019   Chronic venous insufficiency 09/28/2019   Allergic rhinitis 09/26/2019   Carpal tunnel syndrome 09/26/2019   Hiatal hernia 09/26/2019   Hx of migraine headaches 09/26/2019   Positive PPD 09/26/2019   Leg pain 09/12/2019   Mild sleep apnea 08/08/2019   Thyroid nodule 05/18/2019   Thyroid fullness 05/01/2019   Bilateral wrist pain 02/21/2019   Radial styloid tenosynovitis 10/27/2018   Anxiety 06/08/2018   Sinusitis 06/08/2018   History of meningioma of the brain  12/21/2017   Herpes 07/23/2017   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 ([place]) 06/25/2017   Pain of both hip joints 05/18/2017   Vitamin D deficiency 09/21/2016   Cough 01/07/2015   Difficulty sleeping 01/07/2015   Health care maintenance 08/02/2014   Snoring 03/19/2014   Dysphagia 01/15/2014   BMI 34.0-34.9,adult 12/11/2013   Skin lesion 12/11/2013   Family history of breast cancer 10/22/2013   Environmental allergies 10/22/2013   Essential hypertension, benign 09/15/2012   Hypercholesterolemia 09/15/2012   Rheumatoid arthritis ([place]) 09/15/2012   GERD (gastroesophageal reflux disease) 09/15/2012   Family history of colon cancer 09/15/2012   Anemia 09/15/2012   Hypothyroidism 09/15/2012    Past Surgical History:  Procedure Laterality Date   CARPAL TUNNEL RELEASE Right    [place]   COLONOSCOPY  03/14/2002, 08/16/2007, 03/07/2013   [facility]-father   COLONOSCOPY WITH PROPOFOL N/A 06/21/2018   Procedure: COLONOSCOPY WITH PROPOFOL;  Surgeon: Manya Silvas, MD;  Location: [facility] ENDOSCOPY;  Service: Endoscopy;  Laterality: N/A;   CRANIOTOMY Left 06/25/2017   Procedure: CRANIOTOMY TEMPORAL LEFT FOR TUMOR RESECTION;  Surgeon: Ashok Pall, MD;  Location: [place];  Service: Neurosurgery;  Laterality: Left;   ESOPHAGOGASTRODUODENOSCOPY  03/14/2002, 03/07/2013   ESOPHAGOGASTRODUODENOSCOPY (EGD) WITH PROPOFOL N/A 06/21/2018   Procedure: ESOPHAGOGASTRODUODENOSCOPY (EGD) WITH PROPOFOL;  Surgeon: Manya Silvas, MD;  Location[facility] ENDOSCOPY;  Service: Endoscopy;  Laterality: N/A;   ESOPHAGOGASTRODUODENOSCOPY (  EGD) WITH PROPOFOL N/A 07/15/2019   Procedure: ESOPHAGOGASTRODUODENOSCOPY (EGD) WITH PROPOFOL;  Surgeon: Jonathon Bellows, MD;  Location: [facility] ENDOSCOPY;  Service: Gastroenterology;  Laterality: N/A;   Post Septoplasty and turbinate reduction  2007   TRIGGER FINGER RELEASE      OB History   No obstetric history on file.      Home Medications    Prior to  Admission medications   Medication Sig Start Date End Date Taking? Authorizing Provider  azelastine (ASTELIN) 0.1 % nasal spray Parker 2 sprays into both nostrils 2 (two) times daily. Use in each nostril as directed 12/07/19   Julian Hy, DO  budesonide-formoterol [facility]) 80-4.5 MCG/ACT inhaler 1-2 puffs twice daily (rinse mouth after use) 02/20/20   Martyn Ehrich, NP  chlorthalidone (HYGROTON) 25 MG tablet TAKE 1/2 TABLET BY MOUTH EVERY DAY 02/11/21   Einar Pheasant, MD  cholecalciferol (VITAMIN D3) 25 MCG (1000 UNIT) tablet Take 1,000 Units by mouth daily.    [provider]  citalopram (CELEXA) 40 MG tablet TAKE 1 TABLET(40 MG) BY MOUTH DAILY 08/20/20   Einar Pheasant, MD  folic acid (FOLVITE) 1 MG tablet Take 1 mg by mouth daily. 03/10/19   [provider]  inFLIXimab-axxq (AVSOLA) 100 MG SOLR Avsola 100 mg intravenous solution  Inject by intravenous route.    [provider]  levocetirizine (XYZAL) 5 MG tablet TAKE 1 TABLET BY MOUTH EVERY DAY IN THE EVENING 10/01/20   Einar Pheasant, MD  losartan (COZAAR) 100 MG tablet TAKE 1 TABLET(100 MG) BY MOUTH DAILY 11/22/20   Einar Pheasant, MD  medroxyPROGESTERone (DEPO-PROVERA) 150 MG/ML injection Inject 150 mg into the muscle every 3 (three) months.     [provider]  methotrexate (RHEUMATREX) 2.5 MG tablet Take 22.5 mg by mouth every Monday. Caution:Chemotherapy. Protect from light. Take 9 tabs po weekly with meals    [provider]  montelukast (SINGULAIR) 10 MG tablet take 1 tablet by mouth at bedtime 10/08/17   Einar Pheasant, MD  omeprazole (PRILOSEC) 40 MG capsule Take 1 capsule (40 mg total) by mouth 2 (two) times daily. 01/24/21   Jonathon Bellows, MD  Semaglutide, 2 MG/DOSE, (OZEMPIC, 2 MG/DOSE,) 8 MG/3ML SOPN Inject 2 mg into the skin once a week. 12/13/20   Einar Pheasant, MD  vitamin B-12 (CYANOCOBALAMIN) 100 MCG tablet Take 100 mcg by mouth daily.    [provider]    Family  History Family History  Problem Relation Age of Onset   Cancer Mother        Breast Cancer   Breast cancer Mother 39   Cancer Father        Colon Cancer   Heart disease Father        Hx of MI   Hypertension Father    Diabetes Father    Hyperlipidemia Father    Cancer Maternal Grandmother        lung cancer   Cancer Maternal Uncle        esophageal    Social History Social History   Tobacco Use   Smoking status: Never   Smokeless tobacco: Never  Vaping Use   Vaping Use: Never used  Substance Use Topics   Alcohol use: Yes    Alcohol/week: 0.0 standard drinks    Comment: wine cooler 2 drinks 2x a week   Drug use: No     Allergies   Inh [isoniazid]   Review of Systems Review of Systems  Constitutional:  Negative for  chills and fever.  HENT:  Positive for ear pain, postnasal drip and sore throat.   Respiratory:  Positive for cough. Negative for shortness of breath.   Cardiovascular:  Negative for chest pain and palpitations.  Gastrointestinal:  Negative for abdominal pain and vomiting.  Skin:  Negative for color change and rash.  All other systems reviewed and are negative.   Physical Exam Triage Vital Signs ED Triage Vitals  Enc Vitals Group     BP      Pulse      Resp      Temp      Temp src      SpO2      Weight      Height      Head Circumference      Peak Flow      Pain Score      Pain Loc      Pain Edu?      Excl. in [place]?    No data found.  Updated Vital Signs BP 106/77   Pulse 100   Temp 98.9 F (37.2 C)   Resp 20   SpO2 98%   Visual Acuity Right Eye Distance:   Left Eye Distance:   Bilateral Distance:    Right Eye Near:   Left Eye Near:    Bilateral Near:     Physical Exam Vitals and nursing note reviewed.  Constitutional:      General: She is not in acute distress.    Appearance: She is well-developed. She is not ill-appearing.  HENT:     Head: Normocephalic and atraumatic.     Right Ear: Tympanic membrane and ear canal  normal.     Left Ear: Tympanic membrane and ear canal normal.     Nose: Nose normal.     Mouth/Throat:     Mouth: Mucous membranes are moist.     Pharynx: Posterior oropharyngeal erythema present.  Eyes:     Conjunctiva/sclera: Conjunctivae normal.  Cardiovascular:     Rate and Rhythm: Normal rate and regular rhythm.     Heart sounds: Normal heart sounds.  Pulmonary:     Effort: Pulmonary effort is normal. No respiratory distress.     Breath sounds: Normal breath sounds.  Abdominal:     Palpations: Abdomen is soft.     Tenderness: There is no abdominal tenderness.  Musculoskeletal:     Cervical back: Neck supple.  Skin:    General: Skin is warm and dry.  Neurological:     General: No focal deficit present.     Mental Status: She is alert and oriented to person, Parker, and time.     Gait: Gait normal.  Psychiatric:        Mood and Affect: Mood normal.        Behavior: Behavior normal.     UC Treatments / Results  Labs (all labs ordered are listed, but only abnormal results are displayed) Labs Reviewed  NOVEL CORONAVIRUS, NAA  POCT RAPID STREP A (OFFICE)    EKG   Radiology No results found.  Procedures Procedures (including critical care time)  Medications Ordered in UC Medications - No data to display  Initial Impression / Assessment and Plan / UC Course  I have reviewed the triage vital signs and the nursing notes.  Pertinent labs & imaging results that were available during my care of the patient were reviewed by me and considered in my medical decision making (see chart for details).  Viral illness.  Rapid strep negative. COVID pending.  Instructed patient to self quarantine per CDC guidelines.  Discussed symptomatic treatment including Tylenol or ibuprofen, rest, hydration.  Instructed patient to follow up with PCP if symptoms are not improving.  Patient agrees to plan of care.    Final Clinical Impressions(s) / UC Diagnoses   Final diagnoses:  Viral  illness     Discharge Instructions      Your rapid strep test is negative.     Your COVID test is pending.  You should self quarantine until the test result is back.    Take Tylenol or ibuprofen as needed for fever or discomfort.  Rest and keep yourself hydrated.    Follow-up with your primary care provider if your symptoms are not improving.         ED Prescriptions   None    PDMP not reviewed this encounter.   Sharion Balloon, NP 03/10/21 1028

## 2021-03-11 ENCOUNTER — Other Ambulatory Visit: Payer: 59

## 2021-03-11 LAB — NOVEL CORONAVIRUS, NAA: SARS-CoV-2, NAA: NOT DETECTED

## 2021-03-11 LAB — SARS-COV-2, NAA 2 DAY TAT

## 2021-03-12 DIAGNOSIS — Z6829 Body mass index (BMI) 29.0-29.9, adult: Secondary | ICD-10-CM | POA: Insufficient documentation

## 2021-03-13 ENCOUNTER — Ambulatory Visit: Payer: 59 | Admitting: Pharmacist

## 2021-03-13 ENCOUNTER — Telehealth: Payer: Self-pay | Admitting: Pharmacist

## 2021-03-13 DIAGNOSIS — Z6834 Body mass index (BMI) 34.0-34.9, adult: Secondary | ICD-10-CM

## 2021-03-13 DIAGNOSIS — E7800 Pure hypercholesterolemia, unspecified: Secondary | ICD-10-CM

## 2021-03-13 DIAGNOSIS — I10 Essential (primary) hypertension: Secondary | ICD-10-CM

## 2021-03-13 MED ORDER — SEMAGLUTIDE-WEIGHT MANAGEMENT 2.4 MG/0.75ML ~~LOC~~ SOAJ: 2.4000 mg | SUBCUTANEOUS | 1 refills | Status: AC

## 2021-03-13 NOTE — Chronic Care Management (AMB) (Signed)
Care Management   Pharmacy Note  03/13/2021 Name: Tina Parker MRN: [id number] DOB: 07-May-1971  Subjective: Tina Parker is a 50 y.o. year old female who is a primary care patient of Einar Pheasant, MD. The Care Management team was consulted for assistance with care management and care coordination needs.    Engaged with patient by telephone for  medication access  in response to provider referral for pharmacy case management and/or care coordination services.   The patient was given information about Care Management services today including:  Care Management services includes personalized support from designated clinical staff supervised by the patient's primary care provider, including individualized plan of care and coordination with other care providers. 24/7 contact phone numbers for assistance for urgent and routine care needs. The patient may stop case management services at any time by phone call to the office staff.  Patient agreed to services and consent obtained.  Assessment:  Review of patient status, including review of consultants reports, laboratory and other test data, was performed as part of comprehensive evaluation and provision of chronic care management services.   SDOH (Social Determinants of Health) assessments and interventions performed:  SDOH Interventions    Flowsheet Row Most Recent Value  SDOH Interventions   Financial Strain Interventions Intervention Not Indicated        Objective:  Lab Results  Component Value Date   CREATININE 1.24 (H) 02/27/2021   CREATININE 1.02 11/19/2020   CREATININE 1.00 07/13/2020    Lab Results  Component Value Date   HGBA1C 5.7 02/27/2021       Component Value Date/Time   CHOL 168 02/27/2021 0732   TRIG 85.0 02/27/2021 0732   HDL 41.50 02/27/2021 0732   CHOLHDL 4 02/27/2021 0732   VLDL 17.0 02/27/2021 0732   [place] 110 (H) 02/27/2021 0732    Clinical ASCVD: No  The 10-year ASCVD risk score  (Arnett DK, et al., 2019) is: 2.6%   Values used to calculate the score:     Age: 68 years     Sex: Female     Is Non-Hispanic African American: Yes     Diabetic: No     Tobacco smoker: No     Systolic Blood Pressure: 99991111 mmHg     Is BP treated: Yes     HDL Cholesterol: 41.5 mg/dL     Total Cholesterol: 168 mg/dL      BP Readings from Last 3 Encounters:  03/10/21 106/77  11/22/20 118/70  10/02/20 114/70    Care Plan  Allergies  Allergen Reactions   Inh [Isoniazid] Other (See Comments)    Causes hepatitis    Medications Reviewed Today     Reviewed by Toma Copier, RN (Registered Nurse) on 03/10/21 at [phone]  Med List Status: <None>   Medication Order Taking? Sig Documenting Provider Last Dose Status Informant  azelastine (ASTELIN) 0.1 % nasal spray [id number]  Place 2 sprays into both nostrils 2 (two) times daily. Use in each nostril as directed Julian Hy, DO  Active   betamethasone acetate-betamethasone sodium phosphate (CELESTONE) injection 12 mg [id number]   Magnus Sinning, MD  Active   budesonide-formoterol [facility]) 80-4.5 MCG/ACT inhaler [id number]  1-2 puffs twice daily (rinse mouth after use) Martyn Ehrich, NP  Active            Med Note Nat Christen Nov 01, 2020  1:04 PM) Taking once daily   chlorthalidone (HYGROTON) 25 MG tablet [id number]  TAKE  1/2 TABLET BY MOUTH EVERY DAY Einar Pheasant, MD  Active   cholecalciferol (VITAMIN D3) 25 MCG (1000 UNIT) tablet [id number]  Take 1,000 Units by mouth daily. [provider]  Active   citalopram (CELEXA) 40 MG tablet [id number]  TAKE 1 TABLET(40 MG) BY MOUTH DAILY Einar Pheasant, MD  Active   folic acid (FOLVITE) 1 MG tablet [id number]  Take 1 mg by mouth daily. [provider]  Active   inFLIXimab-axxq (AVSOLA) 100 MG SOLR [id number]  Avsola 100 mg intravenous solution  Inject by intravenous route. [provider]  Active   levocetirizine (XYZAL) 5 MG tablet [id number]   TAKE 1 TABLET BY MOUTH EVERY DAY IN THE Donald Prose, Rand[place] Patient, MD  Active   losartan (COZAAR) 100 MG tablet [id number]  TAKE 1 TABLET(100 MG) BY MOUTH DAILY Einar Pheasant, MD  Active   medroxyPROGESTERone (DEPO-PROVERA) 150 MG/ML injection [id number]  Inject 150 mg into the muscle every 3 (three) months.  [provider]  Active Self           Med Note Nat Christen Nov 01, 2020  1:05 PM)    methotrexate (RHEUMATREX) 2.5 MG tablet [id number]  Take 22.5 mg by mouth every Monday. Caution:Chemotherapy. Protect from light. Take 9 tabs po weekly with meals [provider]  Active Self  montelukast (SINGULAIR) 10 MG tablet [id number]  take 1 tablet by mouth at bedtime Einar Pheasant, MD  Active   omeprazole (PRILOSEC) 40 MG capsule [id number]  Take 1 capsule (40 mg total) by mouth 2 (two) times daily. Jonathon Bellows, MD  Active   Semaglutide, 2 MG/DOSE, (OZEMPIC, 2 MG/DOSE,) 8 MG/3ML SOPN [id number]  Inject 2 mg into the skin once a week. Einar Pheasant, MD  Active   vitamin B-12 (CYANOCOBALAMIN) 100 MCG tablet [id number]  Take 100 mcg by mouth daily. [provider]  Active             Patient Active Problem List   Diagnosis Date Noted   Wrist pain, right 06/24/2020   Acquired trigger finger of left little finger 12/12/2019   Pain of left hand 10/17/2019   Chronic venous insufficiency 09/28/2019   Allergic rhinitis 09/26/2019   Carpal tunnel syndrome 09/26/2019   Hiatal hernia 09/26/2019   Hx of migraine headaches 09/26/2019   Positive PPD 09/26/2019   Leg pain 09/12/2019   Mild sleep apnea 08/08/2019   Thyroid nodule 05/18/2019   Thyroid fullness 05/01/2019   Bilateral wrist pain 02/21/2019   Radial styloid tenosynovitis 10/27/2018   Anxiety 06/08/2018   Sinusitis 06/08/2018   History of meningioma of the brain 12/21/2017   Herpes 07/23/2017   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 ([place]) 06/25/2017   Pain of both hip joints  05/18/2017   Vitamin D deficiency 09/21/2016   Cough 01/07/2015   Difficulty sleeping 01/07/2015   Health care maintenance 08/02/2014   Snoring 03/19/2014   Dysphagia 01/15/2014   BMI 34.0-34.9,adult 12/11/2013   Skin lesion 12/11/2013   Family history of breast cancer 10/22/2013   Environmental allergies 10/22/2013   Essential hypertension, benign 09/15/2012   Hypercholesterolemia 09/15/2012   Rheumatoid arthritis ([place]) 09/15/2012   GERD (gastroesophageal reflux disease) 09/15/2012   Family history of colon cancer 09/15/2012   Anemia 09/15/2012   Hypothyroidism 09/15/2012    Conditions to be addressed/monitored:  Obesity, HTN  Care Plan : Medication Management  Updates made by De Hollingshead, RPH-CPP since 03/13/2021 12:00 AM  Problem: Obesity, HTN, GERD, depression      Long-Range Goal: Disease Progression Prevention   Start Date: 11/01/2020  This Visit's Progress: On track  Recent Progress: On track  Priority: High  Note:   Current Barriers:  Unable to achieve control of weight   Pharmacist Clinical Goal(s):  Over the next 90 days, patient will achieve goal weight loss of 5-10% from baseline through collaboration with PharmD and provider.   Interventions: 1:1 collaboration with Einar Pheasant, MD regarding development and update of comprehensive plan of care as evidenced by provider attestation and co-signature Inter-disciplinary care team collaboration (see longitudinal plan of care) Comprehensive medication review performed; medication list updated in electronic medical record  Prediabetes, Obesity: Unable to achieve sustained weight loss with diet and exercise alone; current treatment: Ozempic 2 mg - reports pharmacy cannot order right now Baseline weight: 183 lbs; Most recent weight: 156 lbs; total weight loss 27 lbs (~ 15% weight loss from baseline) Current meal patterns: breakfast: yogurt + fruit; oatmeal, occasionally hot cake, bacon, toast; cutting  back on biscuits, getting english muffin instead; lunch: variable; sometimes eats snack instead: deli meat w/ whole grain pocket, lays; medium pepsi; drinks: more sodas lately, otherwise drinks water; supper: variable, doesn't cook a lot during the week due to work schedule, trying to meal prep more, but ends up getting quick take out; has been focusing on good choices with her husband and daughter  Current exercise: limited by RA pain recently Stop Ozempic. Start Wegovy 2.4 mg weekly. Script sent to her pharmacy. PA was previously not required for this medication on her plan.   Hypertension: Controlled; current treatment: chlorthalidone 12.5 mg daily, losartan 100 mg daily Previously recommended to continue current regimen at this time  Asthma/allergies: Controlled; current treatment: Albuterol HFA PRN, Symbicort 80/4.5 1-2 puffs PRN, azelastine 0.1 % nasal spray PRN, levocetirizine 5 mg daily, montelukast 10 mg daily Previously recommended to continue current regimen at this time  Depression: Controlled per patient report; current treatment: citalopram 40 mg daily Reviewed that SSRIs can increase risk of weight gain, but appropriate to continue if benefit outweighs risk.  Previously recommended to continue current regimen at this time  Rheumatoid Arthritis: Improving per patient report; current regimen: infliximab IV infusion, methotrexate [id number] mg weekly, folic acid 1 mg daily; follows w/ Dr. Jefm Bryant Previously recommended to continue current regimen at this time along with collaboration with rheumatology.   Contraception: Managed; current regimen: medroxyprogesterone IM Q3 months Previously discussed that medroxyprogesterone may increase risk of weight gain, though if benefits outweigh risk of weight gain, appropriate to continue therapy Previously recommended to continue current regimen at this time  GERD/hiatal hernia: Moderately well managed; current regimen: dexlansoprazole 60 mg  daily  Previously recommended to continue current regimen at this time   Patient Goals/Self-Care Activities Over the next 90 days, patient will:  - take medications as prescribed target a minimum of 150 minutes of moderate intensity exercise weekly engage in dietary modifications by moderation in carbohydrate portion sizes  Follow Up Plan: Patient will contact if future questions or concerns     Medication Assistance:  None required.  Patient affirms current coverage meets needs.  Follow Up:  Patient agrees to Care Plan and Follow-up.  Plan: patient will outreach if further questions or concerns  Catie Darnelle Maffucci, PharmD, [place], [place] Clinical Pharmacist [employer] at [place]

## 2021-03-13 NOTE — Telephone Encounter (Signed)
Patient states that her Ozempic is on back order. The Ozempic 2.0 has been on back order for weeks.   Wegovy doses available at the pharmacy are 2.4 and 1.7.   Please advise, Patient would like to speak with Catie

## 2021-03-13 NOTE — Telephone Encounter (Signed)
See CCM documentation 

## 2021-03-13 NOTE — Patient Instructions (Signed)
Visit Information   Goals Addressed               This Visit's Progress     Patient Stated     Disease Management (pt-stated)        Patient Goals/Self-Care Activities Over the next 90 days, patient will:  - take medications as prescribed target a minimum of 150 minutes of moderate intensity exercise weekly engage in dietary modifications by moderation in carbohydrate portion sizes         Patient verbalizes understanding of instructions provided today and agrees to view in [place].   Plan: patient will outreach if further questions or concerns  Catie Darnelle Maffucci, PharmD, [place], [place] Clinical Pharmacist [employer] at [place]

## 2021-03-15 ENCOUNTER — Other Ambulatory Visit: Payer: Self-pay | Admitting: Neurosurgery

## 2021-03-15 ENCOUNTER — Other Ambulatory Visit: Payer: Self-pay

## 2021-03-15 ENCOUNTER — Other Ambulatory Visit (INDEPENDENT_AMBULATORY_CARE_PROVIDER_SITE_OTHER): Payer: 59

## 2021-03-15 DIAGNOSIS — R944 Abnormal results of kidney function studies: Secondary | ICD-10-CM

## 2021-03-15 DIAGNOSIS — D329 Benign neoplasm of meninges, unspecified: Secondary | ICD-10-CM

## 2021-03-15 LAB — URINALYSIS, ROUTINE W REFLEX MICROSCOPIC
Bilirubin Urine: NEGATIVE
Hgb urine dipstick: NEGATIVE
Ketones, ur: NEGATIVE
Leukocytes,Ua: NEGATIVE
Nitrite: NEGATIVE
RBC / HPF: NONE SEEN (ref 0–?)
Specific Gravity, Urine: 1.015 (ref 1.000–1.030)
Total Protein, Urine: NEGATIVE
Urine Glucose: NEGATIVE
Urobilinogen, UA: 0.2 (ref 0.0–1.0)
pH: 6 (ref 5.0–8.0)

## 2021-03-15 LAB — BASIC METABOLIC PANEL
BUN: 15 mg/dL (ref 6–23)
CO2: 27 mEq/L (ref 19–32)
Calcium: 9.6 mg/dL (ref 8.4–10.5)
Chloride: 101 mEq/L (ref 96–112)
Creatinine, Ser: 1.26 mg/dL — ABNORMAL HIGH (ref 0.40–1.20)
GFR: 49.27 mL/min — ABNORMAL LOW (ref >60.00)
Glucose, Bld: 80 mg/dL (ref 70–99)
Potassium: 3.7 mEq/L (ref 3.5–5.1)
Sodium: 137 mEq/L (ref 135–145)

## 2021-03-17 ENCOUNTER — Other Ambulatory Visit: Payer: Self-pay

## 2021-03-17 ENCOUNTER — Ambulatory Visit
Admission: RE | Admit: 2021-03-17 | Discharge: 2021-03-17 | Disposition: A | Discharge status: Home / Self Care | Payer: 59 | Source: Ambulatory Visit | Attending: Neurosurgery | Admitting: Neurosurgery

## 2021-03-17 DIAGNOSIS — D329 Benign neoplasm of meninges, unspecified: Secondary | ICD-10-CM

## 2021-03-17 MED ORDER — GADOBENATE DIMEGLUMINE 529 MG/ML IV SOLN
14.0000 mL | Freq: Once | INTRAVENOUS | Status: AC | PRN
  Administered 2021-03-17: 14 mL via INTRAVENOUS

## 2021-03-20 ENCOUNTER — Other Ambulatory Visit: Payer: 59

## 2021-03-27 ENCOUNTER — Other Ambulatory Visit: Payer: Self-pay

## 2021-03-27 ENCOUNTER — Ambulatory Visit (INDEPENDENT_AMBULATORY_CARE_PROVIDER_SITE_OTHER): Payer: 59 | Admitting: Internal Medicine

## 2021-03-27 VITALS — BP 130/78 | HR 81 | Temp 97.6°F | Resp 16 | Ht 61.0 in | Wt 157.8 lb

## 2021-03-27 DIAGNOSIS — Z9109 Other allergy status, other than to drugs and biological substances: Secondary | ICD-10-CM

## 2021-03-27 DIAGNOSIS — K449 Diaphragmatic hernia without obstruction or gangrene: Secondary | ICD-10-CM

## 2021-03-27 DIAGNOSIS — D649 Anemia, unspecified: Secondary | ICD-10-CM

## 2021-03-27 DIAGNOSIS — I10 Essential (primary) hypertension: Secondary | ICD-10-CM

## 2021-03-27 DIAGNOSIS — M059 Rheumatoid arthritis with rheumatoid factor, unspecified: Secondary | ICD-10-CM

## 2021-03-27 DIAGNOSIS — E7800 Pure hypercholesterolemia, unspecified: Secondary | ICD-10-CM

## 2021-03-27 DIAGNOSIS — K219 Gastro-esophageal reflux disease without esophagitis: Secondary | ICD-10-CM

## 2021-03-27 DIAGNOSIS — D329 Benign neoplasm of meninges, unspecified: Secondary | ICD-10-CM

## 2021-03-27 DIAGNOSIS — E039 Hypothyroidism, unspecified: Secondary | ICD-10-CM | POA: Diagnosis not present

## 2021-03-27 DIAGNOSIS — E041 Nontoxic single thyroid nodule: Secondary | ICD-10-CM

## 2021-03-27 DIAGNOSIS — G4730 Sleep apnea, unspecified: Secondary | ICD-10-CM

## 2021-03-27 DIAGNOSIS — R944 Abnormal results of kidney function studies: Secondary | ICD-10-CM | POA: Diagnosis not present

## 2021-03-27 DIAGNOSIS — F419 Anxiety disorder, unspecified: Secondary | ICD-10-CM

## 2021-03-27 LAB — CBC WITH DIFFERENTIAL/PLATELET
Basophils Absolute: 0 10*3/uL (ref 0.0–0.1)
Basophils Relative: 0.8 % (ref 0.0–3.0)
Eosinophils Absolute: 0.1 10*3/uL (ref 0.0–0.7)
Eosinophils Relative: 1.3 % (ref 0.0–5.0)
HCT: 30.8 % — ABNORMAL LOW (ref 36.0–46.0)
Hemoglobin: 10.9 g/dL — ABNORMAL LOW (ref 12.0–15.0)
Lymphocytes Relative: 31.2 % (ref 12.0–46.0)
Lymphs Abs: 1.5 10*3/uL (ref 0.7–4.0)
MCHC: 35.2 g/dL (ref 30.0–36.0)
MCV: 97.9 fl (ref 78.0–100.0)
Monocytes Absolute: 0.6 10*3/uL (ref 0.1–1.0)
Monocytes Relative: 11.6 % (ref 3.0–12.0)
Neutro Abs: 2.7 10*3/uL (ref 1.4–7.7)
Neutrophils Relative %: 55.1 % (ref 43.0–77.0)
Platelets: 380 10*3/uL (ref 150.0–400.0)
RBC: 3.15 Mil/uL — ABNORMAL LOW (ref 3.87–5.11)
RDW: 13.9 % (ref 11.5–15.5)
WBC: 4.9 10*3/uL (ref 4.0–10.5)

## 2021-03-27 LAB — BASIC METABOLIC PANEL
BUN: 11 mg/dL (ref 6–23)
CO2: 29 mEq/L (ref 19–32)
Calcium: 9.9 mg/dL (ref 8.4–10.5)
Chloride: 101 mEq/L (ref 96–112)
Creatinine, Ser: 0.99 mg/dL (ref 0.40–1.20)
GFR: 65.79 mL/min (ref >60.00)
Glucose, Bld: 75 mg/dL (ref 70–99)
Potassium: 4.2 mEq/L (ref 3.5–5.1)
Sodium: 137 mEq/L (ref 135–145)

## 2021-03-27 NOTE — Progress Notes (Signed)
Patient ID: Tina Parker, female   DOB: 1971/04/21, 50 y.o.   MRN: [id number]   Subjective:    Patient ID: Tina Parker, female    DOB: 06-Apr-1971, 50 y.o.   MRN: [id number]  This visit occurred during the SARS-CoV-2 public health emergency.  Safety protocols were in place, including screening questions prior to the visit, additional usage of staff PPE, and extensive cleaning of exam room while observing appropriate contact time as indicated for disinfecting solutions.   Patient here for scheduled follow up.  Marland Kitchen   HPI Here to follow up regarding her blood pressure, fluid retention and weight loss.  She is doing relatively well.  Has had persistent issues with sore throat.  Has been present for weeks.  Right ear sensation.  Sinus drainage.  No chest pain or sob.  No chest tightness.  Taking xyzal, singulair and astelin.  Also added prn allegra.  Has f/u with her allergist.  No increased acid reflux.  No abdominal pain.  Bowels moving.  Handling stress.  Off chlorthalidone - recently stopped given decreased GFR.    Past Medical History:  Diagnosis Date   Allergic rhinitis    Anemia    Arthritis    RA   Carpal tunnel syndrome    Dysphagia    Esophageal spasm 01/16/2014   GERD (gastroesophageal reflux disease)    Graves disease    remission, no ablation, positive medical treatment   History of hiatal hernia    Hypertension    Migraine headache    migraines   PPD positive    hepatitis secondary to [place]   Pure hypercholesterolemia    Rheumatoid arthritis(714.0)    positive anti CCP antibodies, positive RF, oligo-articular, MTX   Sciatica    Past Surgical History:  Procedure Laterality Date   CARPAL TUNNEL RELEASE Right    [place]   COLONOSCOPY  03/14/2002, 08/16/2007, 03/07/2013   [facility]-father   COLONOSCOPY WITH PROPOFOL N/A 06/21/2018   Procedure: COLONOSCOPY WITH PROPOFOL;  Surgeon: Manya Silvas, MD;  Location: [facility] ENDOSCOPY;  Service: Endoscopy;   Laterality: N/A;   CRANIOTOMY Left 06/25/2017   Procedure: CRANIOTOMY TEMPORAL LEFT FOR TUMOR RESECTION;  Surgeon: Ashok Pall, MD;  Location: [place];  Service: Neurosurgery;  Laterality: Left;   ESOPHAGOGASTRODUODENOSCOPY  03/14/2002, 03/07/2013   ESOPHAGOGASTRODUODENOSCOPY (EGD) WITH PROPOFOL N/A 06/21/2018   Procedure: ESOPHAGOGASTRODUODENOSCOPY (EGD) WITH PROPOFOL;  Surgeon: Manya Silvas, MD;  Location: [facility] ENDOSCOPY;  Service: Endoscopy;  Laterality: N/A;   ESOPHAGOGASTRODUODENOSCOPY (EGD) WITH PROPOFOL N/A 07/15/2019   Procedure: ESOPHAGOGASTRODUODENOSCOPY (EGD) WITH PROPOFOL;  Surgeon: Jonathon Bellows, MD;  Location: [facility] ENDOSCOPY;  Service: Gastroenterology;  Laterality: N/A;   Post Septoplasty and turbinate reduction  2007   TRIGGER FINGER RELEASE     Family History  Problem Relation Age of Onset   Cancer Mother        Breast Cancer   Breast cancer Mother 78   Cancer Father        Colon Cancer   Heart disease Father        Hx of MI   Hypertension Father    Diabetes Father    Hyperlipidemia Father    Cancer Maternal Grandmother        lung cancer   Cancer Maternal Uncle        esophageal   Social History   Socioeconomic History   Marital status: Married    Spouse name: Not on file   Number of children:  2   Years of education: Not on file   Highest education level: Not on file  Occupational History   Occupation: Hairstylist  Tobacco Use   Smoking status: Never   Smokeless tobacco: Never  Vaping Use   Vaping Use: Never used  Substance and Sexual Activity   Alcohol use: Yes    Alcohol/week: 0.0 standard drinks    Comment: wine cooler 2 drinks 2x a week   Drug use: No   Sexual activity: Not on file  Other Topics Concern   Not on file  Social History Narrative   Hairdresser    Social Determinants of Health   Financial Resource Strain: Low Risk    Difficulty of Paying Living Expenses: Not hard at all  Food Insecurity: No Food Insecurity   Worried About  Charity fundraiser in the Last Year: Never true   Arboriculturist in the Last Year: Never true  Transportation Needs: Not on file  Physical Activity: Insufficiently Active   Days of Exercise per Week: 2 days   Minutes of Exercise per Session: 20 min  Stress: Not on file  Social Connections: Not on file     Review of Systems  Constitutional:  Negative for appetite change and unexpected weight change.  HENT:  Positive for congestion and postnasal drip. Negative for sinus pressure.   Respiratory:  Negative for cough and chest tightness.        Breathing stable.    Cardiovascular:  Negative for chest pain, palpitations and leg swelling.  Gastrointestinal:  Negative for abdominal pain, diarrhea, nausea and vomiting.  Genitourinary:  Negative for difficulty urinating and dysuria.  Musculoskeletal:  Negative for joint swelling and myalgias.  Skin:  Negative for color change and rash.  Neurological:  Negative for dizziness, light-headedness and headaches.  Psychiatric/Behavioral:  Negative for agitation and dysphoric mood.       Objective:     BP 130/78   Pulse 81   Temp 97.6 F (36.4 C)   Resp 16   Ht 5\' 1"  (1.549 m)   Wt 157 lb 12.8 oz (71.6 kg)   SpO2 98%   BMI 29.82 kg/m  Wt Readings from Last 3 Encounters:  03/27/21 157 lb 12.8 oz (71.6 kg)  11/22/20 180 lb (81.6 kg)  10/02/20 183 lb 6.4 oz (83.2 kg)    Physical Exam Vitals reviewed.  Constitutional:      General: She is not in acute distress.    Appearance: Normal appearance.  HENT:     Head: Normocephalic and atraumatic.     Right Ear: External ear normal.     Left Ear: External ear normal.  Eyes:     General: No scleral icterus.       Right eye: No discharge.        Left eye: No discharge.     Conjunctiva/sclera: Conjunctivae normal.  Neck:     Thyroid: No thyromegaly.  Cardiovascular:     Rate and Rhythm: Normal rate and regular rhythm.  Pulmonary:     Effort: No respiratory distress.     Breath  sounds: Normal breath sounds. No wheezing.  Abdominal:     General: Bowel sounds are normal.     Palpations: Abdomen is soft.     Tenderness: There is no abdominal tenderness.  Musculoskeletal:        General: No swelling or tenderness.     Cervical back: Neck supple. No tenderness.  Lymphadenopathy:     Cervical:  No cervical adenopathy.  Skin:    Findings: No erythema or rash.  Neurological:     Mental Status: She is alert.  Psychiatric:        Mood and Affect: Mood normal.        Behavior: Behavior normal.     Outpatient Encounter Medications as of 03/27/2021  Medication Sig   azelastine (ASTELIN) 0.1 % nasal spray Place 2 sprays into both nostrils 2 (two) times daily. Use in each nostril as directed   budesonide-formoterol (SYMBICORT) 80-4.5 MCG/ACT inhaler 1-2 puffs twice daily (rinse mouth after use)   chlorthalidone (HYGROTON) 25 MG tablet TAKE 1/2 TABLET BY MOUTH EVERY DAY   cholecalciferol (VITAMIN D3) 25 MCG (1000 UNIT) tablet Take 1,000 Units by mouth daily.   citalopram (CELEXA) 40 MG tablet TAKE 1 TABLET(40 MG) BY MOUTH DAILY   folic acid (FOLVITE) 1 MG tablet Take 1 mg by mouth daily.   inFLIXimab-axxq (AVSOLA) 100 MG SOLR Avsola 100 mg intravenous solution  Inject by intravenous route.   levocetirizine (XYZAL) 5 MG tablet TAKE 1 TABLET BY MOUTH EVERY DAY IN THE EVENING   losartan (COZAAR) 100 MG tablet TAKE 1 TABLET(100 MG) BY MOUTH DAILY   medroxyPROGESTERone (DEPO-PROVERA) 150 MG/ML injection Inject 150 mg into the muscle every 3 (three) months.    methotrexate (RHEUMATREX) 2.5 MG tablet Take 22.5 mg by mouth every Monday. Caution:Chemotherapy. Protect from light. Take 9 tabs po weekly with meals   montelukast (SINGULAIR) 10 MG tablet take 1 tablet by mouth at bedtime   omeprazole (PRILOSEC) 40 MG capsule Take 1 capsule (40 mg total) by mouth 2 (two) times daily.   [START ON 07/07/2021] Semaglutide-Weight Management 2.4 MG/0.75ML SOAJ Inject 2.4 mg into the skin once  a week for 28 days.   vitamin B-12 (CYANOCOBALAMIN) 100 MCG tablet Take 100 mcg by mouth daily.   Facility-Administered Encounter Medications as of 03/27/2021  Medication   betamethasone acetate-betamethasone sodium phosphate (CELESTONE) injection 12 mg     Lab Results  Component Value Date   WBC 4.9 03/27/2021   HGB 10.9 (L) 03/27/2021   HCT 30.8 (L) 03/27/2021   PLT 380.0 03/27/2021   GLUCOSE 75 03/27/2021   CHOL 168 02/27/2021   TRIG 85.0 02/27/2021   HDL 41.50 02/27/2021   LDLCALC 110 (H) 02/27/2021   ALT 15 02/27/2021   AST 19 02/27/2021   NA 137 03/27/2021   K 4.2 03/27/2021   CL 101 03/27/2021   CREATININE 0.99 03/27/2021   BUN 11 03/27/2021   CO2 29 03/27/2021   TSH 2.38 02/27/2021   HGBA1C 5.7 02/27/2021    MR BRAIN W WO CONTRAST  Result Date: 03/17/2021 CLINICAL DATA:  Meningioma, headaches, left-sided facial swelling EXAM: MRI HEAD WITHOUT AND WITH CONTRAST TECHNIQUE: Multiplanar, multiecho pulse sequences of the brain and surrounding structures were obtained without and with intravenous contrast. CONTRAST:  36mL MULTIHANCE GADOBENATE DIMEGLUMINE 529 MG/ML IV SOLN COMPARISON:  07/04/2019. FINDINGS: Brain: Prior left pterional craniotomy, with encephalomalacia along the left temporal pole, unchanged. Minimal dural thickening in this region, without nodularity to suggest recurrent meningioma. Redemonstrated T2 hyperintense, nonenhancing extra-axial mass in the inferior fourth ventricle/foramen of Magendie (series 9, image 7), measuring approximately 7 mm in craniocaudal dimension. No new lesions. No acute infarct, hemorrhage, mass, mass effect, or midline shift. Scattered T2 hyperintense signal in the periventricular white matter, likely the sequela of chronic small vessel ischemic disease. Vascular: Normal flow voids. Skull and upper cervical spine: Status post left pterional craniotomy.  Otherwise normal marrow signal. Sinuses/Orbits: Negative.  The mastoids are well  aerated. Other: No abnormality or swelling is seen in the imaged left facial tissues, which appears symmetric. IMPRESSION: 1. Postoperative changes of left middle cranial fossa meningioma resection, without evidence of residual or recurrent meningioma. 2. 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 Electronically Signed   By: Merilyn Baba M.D.   On: 03/17/2021 23:46       Assessment & Plan:   Problem List Items Addressed This Visit     Anemia    Follow cbc.       Relevant Orders   CBC with Differential/Platelet (Completed)   Anxiety    Continue citalopram.  Follow.       Environmental allergies    With sinus drainage and right ear fullness.  Continue xyzal and singulair.  Continue astelin - add steroid nasal srpay.  Follow.  Keep appt with allergist.       Essential hypertension, benign - Primary    Continue losartan. Off chlorthalidone.   Blood pressure doing well.  Follow pressures.  Follow metabolic panel.       GERD (gastroesophageal reflux disease)    Continue dexilant.        Hiatal hernia    Has adjusted diet.  Lost weight.  Symptoms improved.  Follow.        Hypercholesterolemia    The 10-year ASCVD risk score (Arnett DK, et al., 2019) is: 5.1%   Values used to calculate the score:     Age: 51 years     Sex: Female     Is Non-Hispanic African American: Yes     Diabetic: No     Tobacco smoker: No     Systolic Blood Pressure: 827 mmHg     Is BP treated: Yes     HDL Cholesterol: 41.5 mg/dL     Total Cholesterol: 168 mg/dL  Low cholesterol diet and exercise.  Follow lipid panel.       Hypothyroidism    On thyroid replacement.  Follow tsh.       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       Mild sleep apnea    CPAP.       Rheumatoid arthritis ([place])    Followed by rheumatology. On MTX.  Also receiving avsola.  Stable.        Thyroid nodule    S/p FNA - Dr Gabriel Carina 06/2018 - benign.  Reevaluated 08/2019.  Recommended f/u 6 months - 1 year - per note if symptoms persists.        Other Visit Diagnoses     Decreased GFR       Relevant Orders   Basic metabolic panel (Completed)        Einar Pheasant, MD

## 2021-03-27 NOTE — Patient Instructions (Signed)
Nasacort nasal spray - 2 sprays each nostril one time per day.  Do this in the evening.   

## 2021-03-29 ENCOUNTER — Other Ambulatory Visit: Payer: Self-pay | Admitting: Internal Medicine

## 2021-03-29 DIAGNOSIS — D649 Anemia, unspecified: Secondary | ICD-10-CM

## 2021-03-29 NOTE — Progress Notes (Signed)
Orders placed for f/u labs.  

## 2021-03-31 ENCOUNTER — Encounter: Payer: Self-pay | Admitting: Internal Medicine

## 2021-03-31 NOTE — Assessment & Plan Note (Signed)
Continue dexilant 

## 2021-03-31 NOTE — Assessment & Plan Note (Signed)
Followed by Dr Cabbell - [facility].   

## 2021-03-31 NOTE — Assessment & Plan Note (Signed)
S/p FNA - Dr Gabriel Carina 06/2018 - benign.  Reevaluated 08/2019.  Recommended f/u 6 months - 1 year - per note if symptoms persists.

## 2021-03-31 NOTE — Assessment & Plan Note (Signed)
Has adjusted diet.  Lost weight.  Symptoms improved.  Follow.

## 2021-03-31 NOTE — Assessment & Plan Note (Signed)
On thyroid replacement.  Follow tsh.  

## 2021-03-31 NOTE — Assessment & Plan Note (Signed)
With sinus drainage and right ear fullness.  Continue xyzal and singulair.  Continue astelin - add steroid nasal srpay.  Follow.  Keep appt with allergist.

## 2021-03-31 NOTE — Assessment & Plan Note (Signed)
The 10-year ASCVD risk score (Arnett DK, et al., 2019) is: 5.1%   Values used to calculate the score:     Age: 50 years     Sex: Female     Is Non-Hispanic African American: Yes     Diabetic: No     Tobacco smoker: No     Systolic Blood Pressure: 836 mmHg     Is BP treated: Yes     HDL Cholesterol: 41.5 mg/dL     Total Cholesterol: 168 mg/dL  Low cholesterol diet and exercise.  Follow lipid panel.

## 2021-03-31 NOTE — Assessment & Plan Note (Signed)
Continue citalopram.   Follow.  

## 2021-03-31 NOTE — Assessment & Plan Note (Signed)
CPAP.  

## 2021-03-31 NOTE — Assessment & Plan Note (Signed)
Continue losartan. Off chlorthalidone.   Blood pressure doing well.  Follow pressures.  Follow metabolic panel.

## 2021-03-31 NOTE — Assessment & Plan Note (Signed)
Followed by rheumatology. On MTX.  Also receiving avsola.  Stable.

## 2021-03-31 NOTE — Assessment & Plan Note (Signed)
Follow cbc.  

## 2021-04-08 ENCOUNTER — Ambulatory Visit: Payer: 59

## 2021-04-08 ENCOUNTER — Ambulatory Visit: Payer: 59 | Admitting: Internal Medicine

## 2021-04-15 ENCOUNTER — Other Ambulatory Visit: Payer: 59

## 2021-04-15 ENCOUNTER — Other Ambulatory Visit: Payer: Self-pay

## 2021-04-15 ENCOUNTER — Ambulatory Visit (INDEPENDENT_AMBULATORY_CARE_PROVIDER_SITE_OTHER): Payer: 59 | Admitting: *Deleted

## 2021-04-15 ENCOUNTER — Other Ambulatory Visit (INDEPENDENT_AMBULATORY_CARE_PROVIDER_SITE_OTHER): Payer: 59

## 2021-04-15 DIAGNOSIS — Z3042 Encounter for surveillance of injectable contraceptive: Secondary | ICD-10-CM | POA: Diagnosis not present

## 2021-04-15 DIAGNOSIS — D649 Anemia, unspecified: Secondary | ICD-10-CM | POA: Diagnosis not present

## 2021-04-15 LAB — CBC WITH DIFFERENTIAL/PLATELET
Basophils Absolute: 0 10*3/uL (ref 0.0–0.1)
Basophils Relative: 0.4 % (ref 0.0–3.0)
Eosinophils Absolute: 0 10*3/uL (ref 0.0–0.7)
Eosinophils Relative: 0.2 % (ref 0.0–5.0)
HCT: 32.4 % — ABNORMAL LOW (ref 36.0–46.0)
Hemoglobin: 11.1 g/dL — ABNORMAL LOW (ref 12.0–15.0)
Lymphocytes Relative: 18.1 % (ref 12.0–46.0)
Lymphs Abs: 1 10*3/uL (ref 0.7–4.0)
MCHC: 34.2 g/dL (ref 30.0–36.0)
MCV: 92.3 fl (ref 78.0–100.0)
Monocytes Absolute: 0.5 10*3/uL (ref 0.1–1.0)
Monocytes Relative: 8.3 % (ref 3.0–12.0)
Neutro Abs: 4.2 10*3/uL (ref 1.4–7.7)
Neutrophils Relative %: 73 % (ref 43.0–77.0)
Platelets: 268 10*3/uL (ref 150.0–400.0)
RBC: 3.5 Mil/uL — ABNORMAL LOW (ref 3.87–5.11)
RDW: 14.3 % (ref 11.5–15.5)
WBC: 5.8 10*3/uL (ref 4.0–10.5)

## 2021-04-15 LAB — IBC + FERRITIN
Ferritin: 168.2 ng/mL (ref 10.0–291.0)
Iron: 29 ug/dL — ABNORMAL LOW (ref 42–145)
Saturation Ratios: 10 % — ABNORMAL LOW (ref 20.0–50.0)
TIBC: 289.8 ug/dL (ref 250.0–450.0)
Transferrin: 207 mg/dL — ABNORMAL LOW (ref 212.0–360.0)

## 2021-04-15 MED ORDER — MEDROXYPROGESTERONE ACETATE 150 MG/ML IM SUSP
150.0000 mg | Freq: Once | INTRAMUSCULAR | Status: AC
  Administered 2021-04-15: 150 mg via INTRAMUSCULAR

## 2021-04-15 NOTE — Progress Notes (Signed)
Patient last Dose 01/21/2021 patient in window for 10/7 - 11/9 window. Given in right Upper Quadrant. Patient voiced no concerns or discomfort during or after injection.

## 2021-04-17 ENCOUNTER — Other Ambulatory Visit: Payer: Self-pay | Admitting: Internal Medicine

## 2021-04-17 DIAGNOSIS — D649 Anemia, unspecified: Secondary | ICD-10-CM

## 2021-04-17 NOTE — Progress Notes (Signed)
Order placed for f/u labs.  

## 2021-04-30 ENCOUNTER — Other Ambulatory Visit: Payer: Self-pay | Admitting: Internal Medicine

## 2021-05-06 DIAGNOSIS — M19031 Primary osteoarthritis, right wrist: Secondary | ICD-10-CM | POA: Insufficient documentation

## 2021-05-13 ENCOUNTER — Other Ambulatory Visit: Payer: Self-pay

## 2021-05-13 ENCOUNTER — Ambulatory Visit: Payer: 59 | Admitting: Podiatry

## 2021-05-13 ENCOUNTER — Ambulatory Visit (INDEPENDENT_AMBULATORY_CARE_PROVIDER_SITE_OTHER): Payer: 59

## 2021-05-13 ENCOUNTER — Encounter: Payer: Self-pay | Admitting: Podiatry

## 2021-05-13 DIAGNOSIS — R600 Localized edema: Secondary | ICD-10-CM

## 2021-05-13 DIAGNOSIS — M7672 Peroneal tendinitis, left leg: Secondary | ICD-10-CM

## 2021-05-13 DIAGNOSIS — M2011 Hallux valgus (acquired), right foot: Secondary | ICD-10-CM | POA: Diagnosis not present

## 2021-05-13 DIAGNOSIS — M21612 Bunion of left foot: Secondary | ICD-10-CM

## 2021-05-13 DIAGNOSIS — M2012 Hallux valgus (acquired), left foot: Secondary | ICD-10-CM | POA: Diagnosis not present

## 2021-05-13 DIAGNOSIS — M069 Rheumatoid arthritis, unspecified: Secondary | ICD-10-CM

## 2021-05-13 DIAGNOSIS — M21611 Bunion of right foot: Secondary | ICD-10-CM

## 2021-05-13 NOTE — Progress Notes (Signed)
  Subjective:  Patient ID: Tina Parker, female    DOB: [birth year],  MRN: [id number]  Chief Complaint  Patient presents with   Foot Pain      (xray)she has RA the right foot pain  left ankle swelling    50 y.o. female presents with the above complaint. History confirmed with patient.  She works as a Theme park manager.  She has rheumatoid arthritis.  She takes infliximab infusions as well as methotrexate.  Has been worsening recently and have shorten her infusion interval.  Also has left ankle pain, denies any history of injuries.  Pain is mostly over the bunions on her bilateral foot and the big toe joint  Objective:  Physical Exam: warm, good capillary refill, no trophic changes or ulcerative lesions, normal DP and PT pulses, and normal sensory exam. Left Foot: She has pain over the lateral ankle along the peroneal tendons proximal malleolus, she has a bunion that is painful swollen joint pain with range of motion Right Foot: Bunion painful swollen joint with range of motion mostly dorsal  No images are attached to the encounter.  Radiographs: Multiple views x-ray of both feet: Bilateral hallux valgus deformity with mild lateral joint space narrowing, slight dorsal spurring Assessment:   1. Peroneal tendinitis, left   2. Hallux valgus with bunions, left   3. Hallux valgus with bunions, right   4. Rheumatoid arthritis involving both feet, unspecified whether rheumatoid factor present [name])      Plan:  Patient was evaluated and treated and all questions answered.  Discussed the etiology and treatment options for peroneal tendinitis including stretching, formal physical therapy with an eccentric exercises therapy plan, supportive shoegears such as a running shoe or sneaker, bracing, topical and oral medications.  We also discussed that I do not routinely perform injections in this area because of the risk of an increased damage or rupture of the tendon.  We also discussed the role of  surgical treatment of this for patients who do not improve after exhausting non-surgical treatment options.  -XR reviewed with patient -Educated on stretching and icing of the affected limb.  Home exercise plan given -OTC ibuprofen as needed for this  Discussed etiology and treatment options of bunion deformity including surgical treatment.  This is complicated by her rheumatoid arthritis which I think is causing inflammation of the joint.  Hopefully with changes in her rheumatologic regimen her joint pain overall will improve.  Surgical I think simple bunion correction would likely not alleviate her full symptoms and if she requires surgical intervention then arthrodesis of the first MTPJ is what I would recommend and we discussed this and the rationale behind this plan.  She has a moderate amount of cartilage remaining.  I would hold off on arthrodesis at this point and recommended corticosteroid injection for symptomatic control.  Following sterile prep with Betadine I injected 0.5 cc of lidocaine 2%, 10 mg of Kenalog and 2 mg of dexamethasone phosphate.  She tolerated procedure well.  I will follow-up with her in 6 weeks for reevaluation of her left ankle as well as the steroid joint injections.   Return in about 6 weeks (around 06/24/2021) for re-check peroneal tendinitis and arthritis/bunions.

## 2021-05-13 NOTE — Patient Instructions (Signed)

## 2021-05-16 ENCOUNTER — Ambulatory Visit: Payer: 59 | Admitting: Internal Medicine

## 2021-05-16 ENCOUNTER — Encounter: Payer: Self-pay | Admitting: Internal Medicine

## 2021-05-16 ENCOUNTER — Other Ambulatory Visit: Payer: Self-pay

## 2021-05-16 VITALS — BP 146/98 | HR 102 | Ht 61.0 in | Wt 150.2 lb

## 2021-05-16 DIAGNOSIS — E042 Nontoxic multinodular goiter: Secondary | ICD-10-CM | POA: Insufficient documentation

## 2021-05-16 NOTE — Progress Notes (Addendum)
Patient ID: Tina Parker, female   DOB: 11-21-1970, 50 y.o.   MRN: [id number]   This visit occurred during the SARS-CoV-2 public health emergency.  Safety protocols were in place, including screening questions prior to the visit, additional usage of staff PPE, and extensive cleaning of exam room while observing appropriate contact time as indicated for disinfecting solutions.   HPI  Tina Parker is a 50 y.o.-year-old female, initially referred by her PCP, Dr. Nicki Reaper, returning for follow-up for thyroid nodules.  Last visit 1 year and 1 month ago.  Interim history: Last visit, she mentioned fatigue, but this improved.   She has peroneal tendinitis for which she sees podiatry. At last visit, she had neck pain, dysphagia, choking and hoarseness.  However, since last visit, to avoid hiatal hernia surgery, she lost weight: 39 pounds -on [place] by Dr. Nicki Reaper.  She feels that her neck compression symptoms have improved afterwards.  She now only feels pressure in neck if she lies on her right side.    Reviewed history: At 50 y/o, she had Graves ds. >> started on medication >> after she gave birth to her daughter in 47 >> developed hypothyroidism >> started LT4 >> continued for several years >> now off for "years".  Of note, she had a normal swallowing study in 2015, which only showed a small hiatal hernia.  Pt had a thyroid nodule palpated by PCP in 2020 >> sent for a thyroid U/S - this showed a L thyroid nodule:  Thyroid U/S (05/16/2019): Left inferior, isoechoic, 1.7 cm,  thyroid nodule      FNA of this nodule (07/11/2018): Benign  After the Bx, she developed dysphagia >> had EGD >> Es stretched.  Barium swallow (04/16/2020): Small sliding hiatal hernia. No evidence of esophageal stricture or extrinsic compression. Gastroesophageal reflux to level of midthoracic esophagus.  Thyroid U/S (04/18/2020): Parenchymal Echotexture: Normal Isthmus: 0.3 cm, previously 0.2 cm Right lobe: 3.5  x 1.0 x 1.3 cm, previously 3.5 x 0.9 x 1.2 cm Left lobe: 3.9 x 1.1 x 1.4 cm, previously 3.6 x 1.3 x 1.3 cm _________________________________________________________   Nodule # 1: Location: Right; Mid Maximum size: 0.6 cm; Other 2 dimensions: 0.5 x 0.4 cm Composition: solid/almost completely solid (2) Echogenicity: isoechoic (1) *Given size (<1.4 cm) and appearance, this nodule does NOT meet TI-RADS criteria for biopsy or dedicated follow-up.  _________________________________________________________   Nodule # 2: Prior biopsy: No Location: Left; Mid Maximum size: 1.6 cm; Other 2 dimensions: 1.1 x 0.9 cm, previously, 1.7 x 1.3 x 0.9 cm Composition: solid/almost completely solid (2) Echogenicity: isoechoic (1)  *Given size (>/= 1.5 - 2.4 cm) and appearance, a follow-up ultrasound in 1 year should be considered based on TI-RADS criteria. _________________________________________________________   IMPRESSION: Similar appearing solid left thyroid nodule (labeled 2), again categorized as TI-RADS 3. Recommend annual ultrasound follow-up until 5 years of stability are established. This study marks 1 year stability.  She does have GERD - better after losing weight. Prev. On Dexilant >> had to come off 2/2 insurance coverage, now Omeprazole - but not as good.  I reviewed pt's thyroid tests: Lab Results  Component Value Date   TSH 2.38 02/27/2021   TSH 1.59 04/09/2020   TSH 1.97 04/25/2019   TSH 2.91 10/11/2018   TSH 2.76 12/21/2017   TSH 2.48 01/19/2017   TSH 1.71 03/17/2016   TSH 3.06 03/13/2014   TSH 2.86 04/26/2013   TSH 2.69 09/13/2012   FREET4 0.63 04/09/2020  FREET4 0.71 12/21/2017     No FH of thyroid ds. + FH of thyroid cancer in M first cousin. No h/o radiation tx to head or neck.  No steroid use. No herbal supplements. No Biotin supplements or Hair, Skin and Nails vitamins.  Pt also has a history of leukopenia, meningioma - surgically removed, HTN, HL, RA - on  Actemra and MTX - but not working well, vitamin D deficiency. On B12. Off and on Prednisone - finished 02/2020.   ROS: + see HPI  Past Medical History:  Diagnosis Date   Allergic rhinitis    Anemia    Arthritis    RA   Carpal tunnel syndrome    Dysphagia    Esophageal spasm 01/16/2014   GERD (gastroesophageal reflux disease)    Graves disease    remission, no ablation, positive medical treatment   History of hiatal hernia    Hypertension    Migraine headache    migraines   PPD positive    hepatitis secondary to [place]   Pure hypercholesterolemia    Rheumatoid arthritis(714.0)    positive anti CCP antibodies, positive RF, oligo-articular, MTX   Sciatica    Past Surgical History:  Procedure Laterality Date   CARPAL TUNNEL RELEASE Right    [place]   COLONOSCOPY  03/14/2002, 08/16/2007, 03/07/2013   [facility]-father   COLONOSCOPY WITH PROPOFOL N/A 06/21/2018   Procedure: COLONOSCOPY WITH PROPOFOL;  Surgeon: Manya Silvas, MD;  Location[facility] END[facility]COPY;  Service: Endoscopy;  Laterality: N/A;   CRANIOTOMY Left 06/25/2017   Procedure: CRANIOTOMY TEMPORAL LEFT FOR TUMOR RESECTION;  Surgeon: Ashok Pall, MD;  Location: [facility];  Service: Neurosurgery;  Laterality: Left;   ESOPHAGOGASTRODUODENOSCOPY  03/14/2002, 03/07/2013   ESOPHAGOGASTRODUODENOSCOPY (EGD) WITH PROPOFOL N/A 06/21/2018   Procedure: ESOPHAGOGASTRODUODENOSCOPY (EGD) WITH PROPOFOL;  Surgeon: Manya Silvas, MD;  Location: [facility] ENDOSCOPY;  Service: Endoscopy;  Laterality: N/A;   ESOPHAGOGASTRODUODENOSCOPY (EGD) WITH PROPOFOL N/A 07/15/2019   Procedure: ESOPHAGOGASTRODUODENOSCOPY (EGD) WITH PROPOFOL;  Surgeon: Jonathon Bellows, MD;  Location[facility] END[facility]COPY;  Service: Gastroenterology;  Laterality: N/A;   Post Septoplasty and turbinate reduction  2007   TRIGGER FINGER RELEASE     Social History   Socioeconomic History   Marital status: Married    Spouse name: Not on file   Number of children: 2   Years of  education: Not on file   Highest education level: Not on file  Occupational History   Occupation: Hairstylist  Tobacco Use   Smoking status: Never   Smokeless tobacco: Never  Vaping Use   Vaping Use: Never used  Substance and Sexual Activity   Alcohol use: Yes    Alcohol/week: 0.0 standard drinks    Comment: wine cooler 2 drinks 2x a week   Drug use: No   Sexual activity: Not on file  Other Topics Concern   Not on file  Social History Narrative   Hairdresser    Social Determinants of Health   Financial Resource Strain: Low Risk    Difficulty of Paying Living Expenses: Not hard at all  Food Insecurity: No Food Insecurity   Worried About Charity fundraiser in the Last Year: Never true   Arboriculturist in the Last Year: Never true  Transportation Needs: Not on file  Physical Activity: Insufficiently Active   Days of Exercise per Week: 2 days   Minutes of Exercise per Session: 20 min  Stress: Not on file  Social Connections: Not on  file  Intimate Partner Violence: Not on file   Current Outpatient Medications on File Prior to Visit  Medication Sig Dispense Refill   azelastine (ASTELIN) 0.1 % nasal spray Place 2 sprays into both nostrils 2 (two) times daily. Use in each nostril as directed 30 mL 12   budesonide-formoterol (SYMBICORT) 80-4.5 MCG/ACT inhaler 1-2 puffs twice daily (rinse mouth after use) 1 each 5   chlorthalidone (HYGROTON) 25 MG tablet TAKE 1/2 TABLET BY MOUTH EVERY DAY 15 tablet 1   cholecalciferol (VITAMIN D3) 25 MCG (1000 UNIT) tablet Take 1,000 Units by mouth daily.     citalopram (CELEXA) 40 MG tablet TAKE 1 TABLET(40 MG) BY MOUTH DAILY 30 tablet 5   folic acid (FOLVITE) 1 MG tablet Take 1 mg by mouth daily.     inFLIXimab-axxq (AVSOLA) 100 MG SOLR Avsola 100 mg intravenous solution  Inject by intravenous route.     levocetirizine (XYZAL) 5 MG tablet TAKE 1 TABLET BY MOUTH EVERY DAY IN THE EVENING 30 tablet 1   losartan (COZAAR) 100 MG tablet TAKE 1  TABLET(100 MG) BY MOUTH DAILY 30 tablet 2   medroxyPROGESTERone (DEPO-PROVERA) 150 MG/ML injection Inject 150 mg into the muscle every 3 (three) months.      methotrexate (RHEUMATREX) 2.5 MG tablet Take 22.5 mg by mouth every Monday. Caution:Chemotherapy. Protect from light. Take 9 tabs po weekly with meals     montelukast (SINGULAIR) 10 MG tablet take 1 tablet by mouth at bedtime 30 tablet 3   omeprazole (PRILOSEC) 40 MG capsule Take 1 capsule (40 mg total) by mouth 2 (two) times daily. 60 capsule 3   Semaglutide,0.25 or 0.5MG /DOS, (OZEMPIC, 0.25 OR 0.5 MG/DOSE,) 2 MG/1.5ML SOPN Ozempic 0.25 mg or 0.5 mg (2 mg/1.5 mL) subcutaneous pen injector     [START ON 07/07/2021] Semaglutide-Weight Management 2.4 MG/0.75ML SOAJ Inject 2.4 mg into the skin once a week for 28 days. 9 mL 1   vitamin B-12 (CYANOCOBALAMIN) 100 MCG tablet Take 100 mcg by mouth daily.     Current Facility-Administered Medications on File Prior to Visit  Medication Dose Route Frequency Provider Last Rate Last Admin   betamethasone acetate-betamethasone sodium phosphate (CELESTONE) injection 12 mg  12 mg Other Once Magnus Sinning, MD       Allergies  Allergen Reactions   Cephalexin Shortness Of Breath   Inh [Isoniazid] Other (See Comments)    Causes hepatitis   Oxycodone Shortness Of Breath   Family History  Problem Relation Age of Onset   Cancer Mother        Breast Cancer   Breast cancer Mother 51   Cancer Father        Colon Cancer   Heart disease Father        Hx of MI   Hypertension Father    Diabetes Father    Hyperlipidemia Father    Cancer Maternal Grandmother        lung cancer   Cancer Maternal Uncle        esophageal    PE: BP (!) 146/98 (BP Location: Right Arm, Patient Position: Sitting, Cuff Size: Normal)   Pulse (!) 102   Ht 5\' 1"  (1.549 m)   Wt 150 lb 3.2 oz (68.1 kg)   SpO2 99%   BMI 28.38 kg/m  Wt Readings from Last 3 Encounters:  05/16/21 150 lb 3.2 oz (68.1 kg)  03/27/21 157 lb 12.8 oz  (71.6 kg)  11/22/20 180 lb (81.6 kg)   Constitutional: Slightly overweight,  in NAD Eyes: PERRLA, EOMI, no exophthalmos ENT: moist mucous membranes, no thyromegaly, no cervical lymphadenopathy Cardiovascular: Tachycardia, RR, No MRG Respiratory: CTA B Musculoskeletal: no deformities, strength intact in all 4;  Skin: moist, warm, no rashes Neurological: no tremor with outstretched hands, DTR normal in all 4  ASSESSMENT: 1.  Thyroid nodules  PLAN: 1.  Thyroid nodules -I reviewed the report and images of the thyroid ultrasound from 2021 and her left mid thyroid nodule appears to be stable in size and without worrisome features.  This is isoechoic and does not have microcalcifications, internal blood flow, taller than wide disposition, invaginations in the surrounding tissue.  This nodule was biopsied with benign results in 06/2018.  On the latest ultrasound, she had another, right, thyroid nodule was visible, but this was small, with the largest dimension being 0.6 cm, without concerning features and no follow-up is needed for this nodule. -Of note, she does not have thyroid cancer family history in more than 2 members or personal history of radiation therapy to head or neck, favoring benignity. -We discussed that at today's visit we need to repeat another ultrasound to document 5 years of stability of the left thyroid nodule. -At this visit she mentions that her neck compression symptoms have improved after losing almost 40 pounds.  She felt more pressure in her neck after her nodule biopsy and we discussed that this can happen due to inflammation at the site and a little bleeding.  Indeed, she does not feel any more pain and only feels pressure in her neck when lying on the right side.  As of now, this is not bothersome enough to require surgery. -At this visit we reviewed her most recent barium swallow test results which did not show external compression on the esophagus from the thyroid (but did  show hiatal hernia, which was known).   -For now, we discussed about continuing to follow her expectantly - will check another ultrasound -We did discuss that if her neck compression symptoms become very bothersome, we can attempt thyroid lobectomy, but would not recommend this unless we absolutely have to do it since there is a risk of hypothyroidism and lifelong need for levothyroxine after surgery -TFTs were normal less than 3 months ago.  We will not repeat this today. -Plan to see her back in a year, but I will let her know about the results of her thyroid ultrasound  Orders Placed This Encounter  Procedures   US THYROID   - Total time spent for the visit: 25 minutes, in obtaining medical information from the chart and from the patient, reviewing her  previous labs, imaging evaluations, and treatments, reviewing her symptoms, counseling her about her condition (please see the discussed topics above), and developing a plan to further investigate and treat it.  Thyroid U/S (06/04/2021): Parenchymal Echotexture: Moderately heterogenous  Isthmus: 0.4 cm  Right lobe: 3.4 cm x 1.0 cm x 1.1 cm  Left lobe: 4.2 cm x 1.2 cm x 1.4 cm  _____________________________________________________________________   Nodule labeled 1 in the right mid thyroid, 6 mm, unchanged, and does not meet criteria for further surveillance.   Nodule labeled 2 in the left mid thyroid, previously 1.6 cm, currently measuring 2.0 cm. Nodule remains TR 3 characteristics and meets criteria for further surveillance.     No adenopathy   Recommendations follow those established by the new ACR TI-RADS criteria (J Am Coll Radiol 2017;14:587-595).   IMPRESSION: Left mid thyroid nodule meets criteria for continued surveillance,  as designated by the newly established ACR TI-RADS criteria. Surveillance ultrasound study recommended to be performed annually up to 5 years.   Philemon Kingdom, MD PhD [facility]  Endocrinology

## 2021-05-16 NOTE — Patient Instructions (Addendum)
I ordered a new thyroid U/S for you.  Please come back for a follow-up appointment in 1 year. 

## 2021-05-19 ENCOUNTER — Other Ambulatory Visit: Payer: Self-pay

## 2021-05-19 ENCOUNTER — Ambulatory Visit
Admission: RE | Admit: 2021-05-19 | Discharge: 2021-05-19 | Disposition: A | Discharge status: Home / Self Care | Payer: 59 | Source: Ambulatory Visit | Attending: Emergency Medicine | Admitting: Emergency Medicine

## 2021-05-19 VITALS — BP 166/98 | HR 105 | Temp 99.1°F | Resp 20

## 2021-05-19 DIAGNOSIS — B349 Viral infection, unspecified: Secondary | ICD-10-CM

## 2021-05-19 DIAGNOSIS — I10 Essential (primary) hypertension: Secondary | ICD-10-CM

## 2021-05-19 DIAGNOSIS — Z1152 Encounter for screening for COVID-19: Secondary | ICD-10-CM

## 2021-05-19 NOTE — Discharge Instructions (Addendum)
Your COVID and Flu tests are pending.  You should self quarantine until the test results are back.    Take Tylenol or ibuprofen as needed for fever or discomfort.  Rest and keep yourself hydrated.    Follow-up with your primary care provider if your symptoms are not improving.    Your blood pressure is elevated today at 166/98.  Please have this rechecked by your primary care provider in 2-4 weeks.

## 2021-05-19 NOTE — ED Provider Notes (Signed)
Tina Parker    CSN: [id number] Arrival date & time: 05/19/21  0910      History   Chief Complaint Chief Complaint  Patient presents with   Cough   Fever   Nasal Congestion   Sore Throat    HPI Tina Parker is a 50 y.o. female.  Patient presents with ear fullness, nasal congestion, sore throat, cough x2 to 3 days.  Treatment attempted with OTC cold medication.  No fever, rash, shortness of breath, vomiting, diarrhea, or other symptoms.  Her medical history includes hypertension, rheumatoid arthritis, hypothyroidism, migraine headaches.  The history is provided by the patient and medical records.   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   Pure hypercholesterolemia    Rheumatoid arthritis(714.0)    positive anti CCP antibodies, positive RF, oligo-articular, MTX   Sciatica     Patient Active Problem List   Diagnosis Date Noted   Multiple thyroid nodules 05/16/2021   Arthritis of right wrist 05/06/2021   Body mass index (BMI) 29.0-29.9, adult 03/12/2021   Wrist pain, right 06/24/2020   Fracture of scaphoid bone of wrist 06/11/2020   Acquired trigger finger of left little finger 12/12/2019   Pain of left hand 10/17/2019   Chronic venous insufficiency 09/28/2019   Allergic rhinitis 09/26/2019   Carpal tunnel syndrome 09/26/2019   Hiatal hernia 09/26/2019   Hx of migraine headaches 09/26/2019   Positive PPD 09/26/2019   Leg pain 09/12/2019   Mild sleep apnea 08/08/2019   Thyroid nodule 05/18/2019   Thyroid fullness 05/01/2019   Bilateral wrist pain 02/21/2019   Radial styloid tenosynovitis  10/27/2018   Ependymoma of brain ([place]) 07/15/2018   Anxiety 06/08/2018   Sinusitis 06/08/2018   History of meningioma of the brain 12/21/2017   Herpes 07/23/2017   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 (Tina Parker) 06/25/2017   Pain of both hip joints 05/18/2017   Vitamin D deficiency 09/21/2016   Cough 01/07/2015   Difficulty sleeping 01/07/2015   Health care maintenance 08/02/2014   Snoring 03/19/2014   Dysphagia 01/15/2014   BMI 34.0-34.9,adult 12/11/2013   Skin lesion 12/11/2013   Family history of breast cancer 10/22/2013   Environmental allergies 10/22/2013   Essential hypertension, benign 09/15/2012   Hypercholesterolemia 09/15/2012   Rheumatoid arthritis ([place]) 09/15/2012   GERD (gastroesophageal reflux disease) 09/15/2012   Family history of colon cancer 09/15/2012   Anemia 09/15/2012   Hypothyroidism 09/15/2012    Past Surgical History:  Procedure Laterality Date   CARPAL TUNNEL RELEASE Right    [place]   COLONOSCOPY  03/14/2002, 08/16/2007, 03/07/2013   [facility]-father   COLONOSCOPY WITH PROPOFOL N/A 06/21/2018   Procedure: COLONOSCOPY WITH PROPOFOL;  Surgeon: Manya Silvas, MD;  Location: [facility] ENDOSCOPY;  Service: Endoscopy;  Laterality: N/A;   CRANIOTOMY Left 06/25/2017   Procedure: CRANIOTOMY TEMPORAL LEFT FOR TUMOR RESECTION;  Surgeon: Ashok Pall, MD;  Location: [place];  Service: Neurosurgery;  Laterality: Left;   ESOPHAGOGASTRODUODENOSCOPY  03/14/2002, 03/07/2013   ESOPHAGOGASTRODUODENOSCOPY (EGD)  WITH PROPOFOL N/A 06/21/2018   Procedure: ESOPHAGOGASTRODUODENOSCOPY (EGD) WITH PROPOFOL;  Surgeon: Manya Silvas, MD;  Location: [facility] ENDOSCOPY;  Service: Endoscopy;  Laterality: N/A;   ESOPHAGOGASTRODUODENOSCOPY (EGD) WITH PROPOFOL N/A 07/15/2019   Procedure: ESOPHAGOGASTRODUODENOSCOPY (EGD) WITH PROPOFOL;  Surgeon: Jonathon Bellows, MD;  Location: [facility] ENDOSCOPY;  Service: Gastroenterology;  Laterality: N/A;   Post Septoplasty and turbinate  reduction  2007   TRIGGER FINGER RELEASE      OB History   No obstetric history on file.      Home Medications    Prior to Admission medications   Medication Sig Start Date End Date Taking? Authorizing Provider  azelastine (ASTELIN) 0.1 % nasal spray Place 2 sprays into both nostrils 2 (two) times daily. Use in each nostril as directed 12/07/19   Julian Hy, DO  budesonide-formoterol [facility]) 80-4.5 MCG/ACT inhaler 1-2 puffs twice daily (rinse mouth after use) 02/20/20   Martyn Ehrich, NP  chlorthalidone (HYGROTON) 25 MG tablet TAKE 1/2 TABLET BY MOUTH EVERY DAY 02/11/21   Einar Pheasant, MD  cholecalciferol (VITAMIN D3) 25 MCG (1000 UNIT) tablet Take 1,000 Units by mouth daily.    [provider]  citalopram (CELEXA) 40 MG tablet TAKE 1 TABLET(40 MG) BY MOUTH DAILY 04/30/21   Einar Pheasant, MD  folic acid (FOLVITE) 1 MG tablet Take 1 mg by mouth daily. 03/10/19   [provider]  inFLIXimab-axxq (AVSOLA) 100 MG SOLR Avsola 100 mg intravenous solution  Inject by intravenous route.    [provider]  levocetirizine (XYZAL) 5 MG tablet TAKE 1 TABLET BY MOUTH EVERY DAY IN THE EVENING 10/01/20   Einar Pheasant, MD  losartan (COZAAR) 100 MG tablet TAKE 1 TABLET(100 MG) BY MOUTH DAILY 03/11/21   Einar Pheasant, MD  medroxyPROGESTERone (DEPO-PROVERA) 150 MG/ML injection Inject 150 mg into the muscle every 3 (three) months.     [provider]  methotrexate (RHEUMATREX) 2.5 MG tablet Take 22.5 mg by mouth every Monday. Caution:Chemotherapy. Protect from light. Take 9 tabs po weekly with meals    [provider]  montelukast (SINGULAIR) 10 MG tablet take 1 tablet by mouth at bedtime 10/08/17   Einar Pheasant, MD  omeprazole (PRILOSEC) 40 MG capsule Take 1 capsule (40 mg total) by mouth 2 (two) times daily. 01/24/21   Jonathon Bellows, MD  Semaglutide,0.25 or 0.5MG /DOS, (OZEMPIC, 0.25 OR 0.5 MG/DOSE,) 2 MG/1.5ML SOPN Ozempic 0.25 mg or 0.5 mg (2  mg/1.5 mL) subcutaneous pen injector    [provider]  Semaglutide-Weight Management 2.4 MG/0.75ML SOAJ Inject 2.4 mg into the skin once a week for 28 days. 07/07/21 08/04/21  Einar Pheasant, MD  vitamin B-12 (CYANOCOBALAMIN) 100 MCG tablet Take 100 mcg by mouth daily.    [provider]    Family History Family History  Problem Relation Age of Onset   Cancer Mother        Breast Cancer   Breast cancer Mother 11   Cancer Father        Colon Cancer   Heart disease Father        Hx of MI   Hypertension Father    Diabetes Father    Hyperlipidemia Father    Cancer Maternal Grandmother        lung cancer   Cancer Maternal Uncle        esophageal    Social History Social History   Tobacco Use   Smoking status: Never   Smokeless tobacco: Never  Vaping Use  Vaping Use: Never used  Substance Use Topics   Alcohol use: Yes    Alcohol/week: 0.0 standard drinks    Comment: wine cooler 2 drinks 2x a week   Drug use: No     Allergies   Cephalexin, Inh [isoniazid], and Oxycodone   Review of Systems Review of Systems  Constitutional:  Negative for chills and fever.  HENT:  Positive for congestion, ear pain and sore throat.   Respiratory:  Positive for cough. Negative for shortness of breath.   Cardiovascular:  Negative for chest pain and palpitations.  Gastrointestinal:  Negative for diarrhea and vomiting.  Skin:  Negative for color change and rash.  All other systems reviewed and are negative.   Physical Exam Triage Vital Signs ED Triage Vitals  Enc Vitals Group     BP      Pulse      Resp      Temp      Temp src      SpO2      Weight      Height      Head Circumference      Peak Flow      Pain Score      Pain Loc      Pain Edu?      Excl. in [place]?    No data found.  Updated Vital Signs BP (!) 166/98   Pulse (!) 105   Temp 99.1 F (37.3 C)   Resp 20   SpO2 98%   Visual Acuity Right Eye Distance:   Left Eye Distance:   Bilateral  Distance:    Right Eye Near:   Left Eye Near:    Bilateral Near:     Physical Exam Vitals and nursing note reviewed.  Constitutional:      General: She is not in acute distress.    Appearance: Normal appearance. She is well-developed. She is not ill-appearing.  HENT:     Head: Normocephalic and atraumatic.     Right Ear: Tympanic membrane normal.     Left Ear: Tympanic membrane normal.     Nose: Nose normal.     Mouth/Throat:     Mouth: Mucous membranes are moist.     Pharynx: Oropharynx is clear.  Cardiovascular:     Rate and Rhythm: Normal rate and regular rhythm.     Heart sounds: Normal heart sounds.  Pulmonary:     Effort: Pulmonary effort is normal. No respiratory distress.     Breath sounds: Normal breath sounds.  Abdominal:     Palpations: Abdomen is soft.     Tenderness: There is no abdominal tenderness.  Musculoskeletal:     Cervical back: Neck supple.  Skin:    General: Skin is warm and dry.  Neurological:     Mental Status: She is alert.  Psychiatric:        Mood and Affect: Mood normal.        Behavior: Behavior normal.     UC Treatments / Results  Labs (all labs ordered are listed, but only abnormal results are displayed) Labs Reviewed  COVID-19, FLU A+B NAA    EKG   Radiology No results found.  Procedures Procedures (including critical care time)  Medications Ordered in UC Medications - No data to display  Initial Impression / Assessment and Plan / UC Course  I have reviewed the triage vital signs and the nursing notes.  Pertinent labs & imaging results that were available during my care  of the patient were reviewed by me and considered in my medical decision making (see chart for details).   Viral illness.  Elevated blood pressure with HTN.  COVID and Flu pending.  Instructed patient to self quarantine per CDC guidelines.  Discussed symptomatic treatment including Tylenol or ibuprofen, rest, hydration.  Cautioned patient to avoid cold  medications that can elevate blood pressure.  Instructed patient to follow up with PCP if symptoms are not improving.  Discussed with patient that her blood pressure is elevated today and needs to be rechecked by her PCP in 2 to 4 weeks.  Education provided on managing hypertension.  Patient agrees to plan of care.    Final Clinical Impressions(s) / UC Diagnoses   Final diagnoses:  Encounter for screening for COVID-19  Viral illness  Elevated blood pressure reading in office with diagnosis of hypertension     Discharge Instructions      Your COVID and Flu tests are pending.  You should self quarantine until the test results are back.    Take Tylenol or ibuprofen as needed for fever or discomfort.  Rest and keep yourself hydrated.    Follow-up with your primary care provider if your symptoms are not improving.    Your blood pressure is elevated today at 166/98.  Please have this rechecked by your primary care provider in 2-4 weeks.          ED Prescriptions   None    PDMP not reviewed this encounter.   Sharion Balloon, NP 05/19/21 1019

## 2021-05-19 NOTE — ED Triage Notes (Signed)
Pt here with URI sx since Friday. Low grade fevers.

## 2021-05-20 ENCOUNTER — Ambulatory Visit: Payer: 59 | Admitting: Gastroenterology

## 2021-05-20 LAB — COVID-19, FLU A+B NAA
Influenza A, NAA: NOT DETECTED
Influenza B, NAA: NOT DETECTED
SARS-CoV-2, NAA: NOT DETECTED

## 2021-05-21 ENCOUNTER — Other Ambulatory Visit: Payer: Self-pay | Admitting: Internal Medicine

## 2021-05-27 ENCOUNTER — Other Ambulatory Visit: Payer: Self-pay

## 2021-05-27 ENCOUNTER — Other Ambulatory Visit (INDEPENDENT_AMBULATORY_CARE_PROVIDER_SITE_OTHER): Payer: 59

## 2021-05-27 DIAGNOSIS — D649 Anemia, unspecified: Secondary | ICD-10-CM | POA: Diagnosis not present

## 2021-05-27 LAB — CBC WITH DIFFERENTIAL/PLATELET
Basophils Absolute: 0.1 10*3/uL (ref 0.0–0.1)
Basophils Relative: 0.8 % (ref 0.0–3.0)
Eosinophils Absolute: 0 10*3/uL (ref 0.0–0.7)
Eosinophils Relative: 0.1 % (ref 0.0–5.0)
HCT: 34.8 % — ABNORMAL LOW (ref 36.0–46.0)
Hemoglobin: 11.4 g/dL — ABNORMAL LOW (ref 12.0–15.0)
Lymphocytes Relative: 16 % (ref 12.0–46.0)
Lymphs Abs: 1.8 10*3/uL (ref 0.7–4.0)
MCHC: 32.8 g/dL (ref 30.0–36.0)
MCV: 91.9 fl (ref 78.0–100.0)
Monocytes Absolute: 1 10*3/uL (ref 0.1–1.0)
Monocytes Relative: 8.5 % (ref 3.0–12.0)
Neutro Abs: 8.5 10*3/uL — ABNORMAL HIGH (ref 1.4–7.7)
Neutrophils Relative %: 74.6 % (ref 43.0–77.0)
Platelets: 325 10*3/uL (ref 150.0–400.0)
RBC: 3.79 Mil/uL — ABNORMAL LOW (ref 3.87–5.11)
RDW: 14.9 % (ref 11.5–15.5)
WBC: 11.4 10*3/uL — ABNORMAL HIGH (ref 4.0–10.5)

## 2021-05-27 LAB — IBC + FERRITIN
Ferritin: 261.8 ng/mL (ref 10.0–291.0)
Iron: 56 ug/dL (ref 42–145)
Saturation Ratios: 20.4 % (ref 20.0–50.0)
TIBC: 274.4 ug/dL (ref 250.0–450.0)
Transferrin: 196 mg/dL — ABNORMAL LOW (ref 212.0–360.0)

## 2021-06-03 ENCOUNTER — Ambulatory Visit
Admission: RE | Admit: 2021-06-03 | Discharge: 2021-06-03 | Disposition: A | Discharge status: Home / Self Care | Payer: 59 | Source: Ambulatory Visit | Attending: Internal Medicine | Admitting: Internal Medicine

## 2021-06-03 DIAGNOSIS — E042 Nontoxic multinodular goiter: Secondary | ICD-10-CM

## 2021-06-04 ENCOUNTER — Other Ambulatory Visit: Payer: Self-pay | Admitting: Internal Medicine

## 2021-06-06 ENCOUNTER — Ambulatory Visit: Payer: 59 | Admitting: Gastroenterology

## 2021-06-25 ENCOUNTER — Ambulatory Visit (INDEPENDENT_AMBULATORY_CARE_PROVIDER_SITE_OTHER): Payer: 59 | Admitting: Internal Medicine

## 2021-06-25 ENCOUNTER — Other Ambulatory Visit: Payer: Self-pay

## 2021-06-25 VITALS — BP 136/76 | HR 88 | Temp 97.9°F | Resp 16 | Ht 61.0 in | Wt 149.0 lb

## 2021-06-25 DIAGNOSIS — D329 Benign neoplasm of meninges, unspecified: Secondary | ICD-10-CM

## 2021-06-25 DIAGNOSIS — Z1231 Encounter for screening mammogram for malignant neoplasm of breast: Secondary | ICD-10-CM | POA: Diagnosis not present

## 2021-06-25 DIAGNOSIS — E042 Nontoxic multinodular goiter: Secondary | ICD-10-CM | POA: Diagnosis not present

## 2021-06-25 DIAGNOSIS — G4730 Sleep apnea, unspecified: Secondary | ICD-10-CM

## 2021-06-25 DIAGNOSIS — R1310 Dysphagia, unspecified: Secondary | ICD-10-CM

## 2021-06-25 DIAGNOSIS — K219 Gastro-esophageal reflux disease without esophagitis: Secondary | ICD-10-CM

## 2021-06-25 DIAGNOSIS — Z800 Family history of malignant neoplasm of digestive organs: Secondary | ICD-10-CM

## 2021-06-25 DIAGNOSIS — E7800 Pure hypercholesterolemia, unspecified: Secondary | ICD-10-CM | POA: Diagnosis not present

## 2021-06-25 DIAGNOSIS — D649 Anemia, unspecified: Secondary | ICD-10-CM | POA: Diagnosis not present

## 2021-06-25 DIAGNOSIS — R739 Hyperglycemia, unspecified: Secondary | ICD-10-CM | POA: Diagnosis not present

## 2021-06-25 DIAGNOSIS — I10 Essential (primary) hypertension: Secondary | ICD-10-CM

## 2021-06-25 DIAGNOSIS — F419 Anxiety disorder, unspecified: Secondary | ICD-10-CM

## 2021-06-25 DIAGNOSIS — J309 Allergic rhinitis, unspecified: Secondary | ICD-10-CM

## 2021-06-25 DIAGNOSIS — E039 Hypothyroidism, unspecified: Secondary | ICD-10-CM

## 2021-06-25 DIAGNOSIS — M059 Rheumatoid arthritis with rheumatoid factor, unspecified: Secondary | ICD-10-CM

## 2021-06-25 LAB — IBC + FERRITIN
Ferritin: 219.7 ng/mL (ref 10.0–291.0)
Iron: 72 ug/dL (ref 42–145)
Saturation Ratios: 24.4 % (ref 20.0–50.0)
TIBC: 295.4 ug/dL (ref 250.0–450.0)
Transferrin: 211 mg/dL — ABNORMAL LOW (ref 212.0–360.0)

## 2021-06-25 LAB — HEPATIC FUNCTION PANEL
ALT: 14 U/L (ref 0–35)
AST: 20 U/L (ref 0–37)
Albumin: 3.6 g/dL (ref 3.5–5.2)
Alkaline Phosphatase: 78 U/L (ref 39–117)
Bilirubin, Direct: 0.1 mg/dL (ref 0.0–0.3)
Total Bilirubin: 0.5 mg/dL (ref 0.2–1.2)
Total Protein: 7.3 g/dL (ref 6.0–8.3)

## 2021-06-25 LAB — LIPID PANEL
Cholesterol: 197 mg/dL (ref 0–200)
HDL: 47.5 mg/dL (ref >39.00)
LDL Cholesterol: 126 mg/dL — ABNORMAL HIGH (ref 0–99)
NonHDL: 149.27
Total CHOL/HDL Ratio: 4
Triglycerides: 115 mg/dL (ref 0.0–149.0)
VLDL: 23 mg/dL (ref 0.0–40.0)

## 2021-06-25 LAB — BASIC METABOLIC PANEL
BUN: 10 mg/dL (ref 6–23)
CO2: 26 mEq/L (ref 19–32)
Calcium: 9.3 mg/dL (ref 8.4–10.5)
Chloride: 105 mEq/L (ref 96–112)
Creatinine, Ser: 0.88 mg/dL (ref 0.40–1.20)
GFR: 75.65 mL/min (ref >60.00)
Glucose, Bld: 85 mg/dL (ref 70–99)
Potassium: 4 mEq/L (ref 3.5–5.1)
Sodium: 137 mEq/L (ref 135–145)

## 2021-06-25 LAB — CBC WITH DIFFERENTIAL/PLATELET
Basophils Absolute: 0 10*3/uL (ref 0.0–0.1)
Basophils Relative: 0.5 % (ref 0.0–3.0)
Eosinophils Absolute: 0.1 10*3/uL (ref 0.0–0.7)
Eosinophils Relative: 1 % (ref 0.0–5.0)
HCT: 31 % — ABNORMAL LOW (ref 36.0–46.0)
Hemoglobin: 11.1 g/dL — ABNORMAL LOW (ref 12.0–15.0)
Lymphocytes Relative: 25.7 % (ref 12.0–46.0)
Lymphs Abs: 1.4 10*3/uL (ref 0.7–4.0)
MCHC: 35.8 g/dL (ref 30.0–36.0)
MCV: 100.3 fl — ABNORMAL HIGH (ref 78.0–100.0)
Monocytes Absolute: 0.5 10*3/uL (ref 0.1–1.0)
Monocytes Relative: 10.1 % (ref 3.0–12.0)
Neutro Abs: 3.4 10*3/uL (ref 1.4–7.7)
Neutrophils Relative %: 62.7 % (ref 43.0–77.0)
Platelets: 387 10*3/uL (ref 150.0–400.0)
RBC: 3.09 Mil/uL — ABNORMAL LOW (ref 3.87–5.11)
RDW: 15.4 % (ref 11.5–15.5)
WBC: 5.4 10*3/uL (ref 4.0–10.5)

## 2021-06-25 LAB — HEMOGLOBIN A1C: Hgb A1c MFr Bld: 5.3 % (ref 4.6–6.5)

## 2021-06-25 LAB — TSH: TSH: 3.1 u[IU]/mL (ref 0.35–5.50)

## 2021-06-25 MED ORDER — VALACYCLOVIR HCL 500 MG PO TABS: ORAL_TABLET | ORAL | 0 refills | Status: DC

## 2021-06-25 NOTE — Progress Notes (Signed)
Patient ID: [name], female   DOB: 1971/06/16, 50 y.o.   MRN: [id number]   Subjective:    Patient ID: [name], female    DOB: 1971-04-09, 51 y.o.   MRN: [id number]  This visit occurred during the SARS-CoV-2 public health emergency.  Safety protocols were in place, including screening questions prior to the visit, additional usage of staff PPE, and extensive cleaning of exam room while observing appropriate contact time as indicated for disinfecting solutions.   Patient here for a scheduled follow up.   Chief Complaint  Patient presents with   Hypertension   Hypothyroidism   .   HPI States she is doing relatively well.  Saw endocrinology 05/16/21 for f/u thyroid.  Recent TFTs were normal.  Recommended f/u ultrasound - had 06/03/21.  Continues f/u.  No chest pain reported.  Breathing stable.  No increased cough or congestion.  Eating.  No nausea or vomiting reported.  Taking omeprazole.  Overall appears to be controlling.  Has lost weight - on wegovy.  Tolerating.  Has f/u with Dr Vicente Males - 06/2021.  Discussed scheduling bone density.     Past Medical History:  Diagnosis Date   Allergic rhinitis    Anemia    Arthritis    RA   Carpal tunnel syndrome    Dysphagia    Esophageal spasm 01/16/2014   GERD (gastroesophageal reflux disease)    Graves disease    remission, no ablation, positive medical treatment   History of hiatal hernia    Hypertension    Migraine headache    migraines   PPD positive    hepatitis secondary to [place]   Pure hypercholesterolemia    Rheumatoid arthritis(714.0)    positive anti CCP antibodies, positive RF, oligo-articular, MTX   Sciatica    Past Surgical History:  Procedure Laterality Date   CARPAL TUNNEL RELEASE Right    [place]   COLONOSCOPY  03/14/2002, 08/16/2007, 03/07/2013   [facility]-father   COLONOSCOPY WITH PROPOFOL N/A 06/21/2018   Procedure: COLONOSCOPY WITH PROPOFOL;  Surgeon: Manya Silvas, MD;  [facility]  ENDOSCOPY;  Service: Endoscopy;  Laterality: N/A;   CRANIOTOMY Left 06/25/2017   Procedure: CRANIOTOMY TEMPORAL LEFT FOR TUMOR RESECTION;  Surgeon: Ashok Pall, MD;  Location: [place];  Service: Neurosurgery;  Laterality: Left;   ESOPHAGOGASTRODUODENOSCOPY  03/14/2002, 03/07/2013   ESOPHAGOGASTRODUODENOSCOPY (EGD) WITH PROPOFOL N/A 06/21/2018   Procedure: ESOPHAGOGASTRODUODENOSCOPY (EGD) WITH PROPOFOL;  Surgeon: Manya Silvas, MD;  Location: [facility] ENDOSCOPY;  Service: Endoscopy;  Laterality: N/A;   ESOPHAGOGASTRODUODENOSCOPY (EGD) WITH PROPOFOL N/A 07/15/2019   Procedure: ESOPHAGOGASTRODUODENOSCOPY (EGD) WITH PROPOFOL;  Surgeon: Jonathon Bellows, MD;  Location: [facility] ENDOSCOPY;  Service: Gastroenterology;  Laterality: N/A;   Post Septoplasty and turbinate reduction  2007   TRIGGER FINGER RELEASE     Family History  Problem Relation Age of Onset   Cancer Mother        Breast Cancer   Breast cancer Mother 30   Cancer Father        Colon Cancer   Heart disease Father        Hx of MI   Hypertension Father    Diabetes Father    Hyperlipidemia Father    Cancer Maternal Grandmother        lung cancer   Cancer Maternal Uncle        esophageal   Social History   Socioeconomic History   Marital status: Married    Spouse name: Not  on file   Number of children: 2   Years of education: Not on file   Highest education level: Not on file  Occupational History   Occupation: Hairstylist  Tobacco Use   Smoking status: Never   Smokeless tobacco: Never  Vaping Use   Vaping Use: Never used  Substance and Sexual Activity   Alcohol use: Yes    Alcohol/week: 0.0 standard drinks    Comment: wine cooler 2 drinks 2x a week   Drug use: No   Sexual activity: Not on file  Other Topics Concern   Not on file  Social History Narrative   Hairdresser    Social Determinants of Health   Financial Resource Strain: Low Risk    Difficulty of Paying Living Expenses: Not hard at all  Food Insecurity: No  Food Insecurity   Worried About Charity fundraiser in the Last Year: Never true   Arboriculturist in the Last Year: Never true  Transportation Needs: Not on file  Physical Activity: Insufficiently Active   Days of Exercise per Week: 2 days   Minutes of Exercise per Session: 20 min  Stress: Not on file  Social Connections: Not on file     Review of Systems  Constitutional:  Negative for appetite change and unexpected weight change.  HENT:  Negative for congestion and sinus pressure.   Respiratory:  Negative for cough, chest tightness and shortness of breath.   Cardiovascular:  Negative for chest pain, palpitations and leg swelling.  Gastrointestinal:  Negative for abdominal pain, diarrhea, nausea and vomiting.  Genitourinary:  Negative for difficulty urinating and dysuria.  Musculoskeletal:  Negative for myalgias.       RA stable.   Skin:  Negative for color change and rash.  Neurological:  Negative for dizziness, light-headedness and headaches.  Psychiatric/Behavioral:  Negative for agitation and dysphoric mood.       Objective:     BP 136/76    Pulse 88    Temp 97.9 F (36.6 C)    Resp 16    Ht 5\' 1"  (1.549 m)    Wt 149 lb (67.6 kg)    SpO2 98%    BMI 28.15 kg/m  Wt Readings from Last 3 Encounters:  06/25/21 149 lb (67.6 kg)  05/16/21 150 lb 3.2 oz (68.1 kg)  03/27/21 157 lb 12.8 oz (71.6 kg)    Physical Exam Vitals reviewed.  Constitutional:      General: She is not in acute distress.    Appearance: Normal appearance.  HENT:     Head: Normocephalic and atraumatic.     Right Ear: External ear normal.     Left Ear: External ear normal.  Eyes:     General: No scleral icterus.       Right eye: No discharge.        Left eye: No discharge.     Conjunctiva/sclera: Conjunctivae normal.  Neck:     Thyroid: No thyromegaly.  Cardiovascular:     Rate and Rhythm: Normal rate and regular rhythm.  Pulmonary:     Effort: No respiratory distress.     Breath sounds: Normal  breath sounds. No wheezing.  Abdominal:     General: Bowel sounds are normal.     Palpations: Abdomen is soft.     Tenderness: There is no abdominal tenderness.  Musculoskeletal:        General: No swelling or tenderness.     Cervical back: Neck supple. No tenderness.  Lymphadenopathy:     Cervical: No cervical adenopathy.  Skin:    Findings: No erythema or rash.  Neurological:     Mental Status: She is alert.  Psychiatric:        Mood and Affect: Mood normal.        Behavior: Behavior normal.     Outpatient Encounter Medications as of 06/25/2021  Medication Sig   valACYclovir (VALTREX) 500 MG tablet Take 1 tablet bid x 3 days.  May repeat prn with flares.   azelastine (ASTELIN) 0.1 % nasal spray Place 2 sprays into both nostrils 2 (two) times daily. Use in each nostril as directed   budesonide-formoterol (SYMBICORT) 80-4.5 MCG/ACT inhaler 1-2 puffs twice daily (rinse mouth after use)   cholecalciferol (VITAMIN D3) 25 MCG (1000 UNIT) tablet Take 1,000 Units by mouth daily.   citalopram (CELEXA) 40 MG tablet TAKE 1 TABLET(40 MG) BY MOUTH DAILY   folic acid (FOLVITE) 1 MG tablet Take 1 mg by mouth daily.   inFLIXimab-axxq (AVSOLA) 100 MG SOLR Avsola 100 mg intravenous solution  Inject by intravenous route.   levocetirizine (XYZAL) 5 MG tablet TAKE 1 TABLET BY MOUTH EVERY DAY IN THE EVENING   losartan (COZAAR) 100 MG tablet TAKE 1 TABLET(100 MG) BY MOUTH DAILY   medroxyPROGESTERone (DEPO-PROVERA) 150 MG/ML injection Inject 150 mg into the muscle every 3 (three) months.    methotrexate (RHEUMATREX) 2.5 MG tablet Take 22.5 mg by mouth every Monday. Caution:Chemotherapy. Protect from light. Take 9 tabs po weekly with meals   montelukast (SINGULAIR) 10 MG tablet take 1 tablet by mouth at bedtime   omeprazole (PRILOSEC) 40 MG capsule Take 1 capsule (40 mg total) by mouth 2 (two) times daily.   [START ON 07/07/2021] Semaglutide-Weight Management 2.4 MG/0.75ML SOAJ Inject 2.4 mg into the skin  once a week for 28 days.   vitamin B-12 (CYANOCOBALAMIN) 100 MCG tablet Take 100 mcg by mouth daily.   [DISCONTINUED] chlorthalidone (HYGROTON) 25 MG tablet TAKE 1/2 TABLET BY MOUTH EVERY DAY   [DISCONTINUED] Semaglutide,0.25 or 0.5MG /DOS, (OZEMPIC, 0.25 OR 0.5 MG/DOSE,) 2 MG/1.5ML SOPN Ozempic 0.25 mg or 0.5 mg (2 mg/1.5 mL) subcutaneous pen injector   Facility-Administered Encounter Medications as of 06/25/2021  Medication   betamethasone acetate-betamethasone sodium phosphate (CELESTONE) injection 12 mg     Lab Results  Component Value Date   WBC 5.4 06/25/2021   HGB 11.1 (L) 06/25/2021   HCT 31.0 (L) 06/25/2021   PLT 387.0 06/25/2021   GLUCOSE 85 06/25/2021   CHOL 197 06/25/2021   TRIG 115.0 06/25/2021   HDL 47.50 06/25/2021   LDLCALC 126 (H) 06/25/2021   ALT 14 06/25/2021   AST 20 06/25/2021   NA 137 06/25/2021   K 4.0 06/25/2021   CL 105 06/25/2021   CREATININE 0.88 06/25/2021   BUN 10 06/25/2021   CO2 26 06/25/2021   TSH 3.10 06/25/2021   HGBA1C 5.3 06/25/2021    US THYROID  Result Date: 06/04/2021 CLINICAL DATA:  50 year old female with a history of thyroid nodules EXAM: THYROID ULTRASOUND TECHNIQUE: Ultrasound examination of the thyroid gland and adjacent soft tissues was performed. COMPARISON:  Neck CT 10/31/2020, ultrasound 04/16/2020 FINDINGS: Parenchymal Echotexture: Moderately heterogenous Isthmus: 0.4 cm Right lobe: 3.4 cm x 1.0 cm x 1.1 cm Left lobe: 4.2 cm x 1.2 cm x 1.4 cm _________________________________________________________ Estimated total number of nodules >/= 1 cm: 1 Number of spongiform nodules >/=  2 cm not described below (TR1): 0 Number of mixed cystic and solid  nodules >/= 1.5 cm not described below (TR2): 0 _________________________________________________________ Nodule labeled 1 in the right mid thyroid, 6 mm, unchanged, and does not meet criteria for further surveillance. Nodule labeled 2 in the left mid thyroid, previously 1.6 cm, currently  measuring 2.0 cm. Nodule remains TR 3 characteristics and meets criteria for further surveillance. No adenopathy Recommendations follow those established by the new ACR TI-RADS criteria (J Am Coll Radiol 2017;14:587-595). IMPRESSION: Left mid thyroid nodule meets criteria for continued surveillance, as designated by the newly established ACR TI-RADS criteria. Surveillance ultrasound study recommended to be performed annually up to 5 years. Recommendations follow those established by the new ACR TI-RADS criteria (J Am Coll Radiol 9371;69:678-938). Electronically Signed   By: Corrie Mckusick D.O.   On: 06/04/2021 08:33       Assessment & Plan:   Problem List Items Addressed This Visit     Allergic rhinitis    Remains on xyzal, singulair and flonase/astelin.  Stable.  Continue.  Follow.        Anemia    History of IDA.  Has seen Dr Vicente Males previously.  Had EGD and colonoscopy.  Question of need for capsule study or any further GI testing for history of IDA.  Has f/u appt with Dr Vicente Males - 06/2021.       Relevant Orders   CBC with Differential/Platelet (Completed)   IBC + Ferritin (Completed)   Anxiety    Continue citalopram.  Follow.       Dysphagia    Overall appears to be stable.  Has seen GI.       Essential hypertension, benign - Primary    Continue losartan. Off chlorthalidone.   Blood pressure as outlined.   Follow pressures.  Follow metabolic panel.       Relevant Orders   Basic metabolic panel (Completed)   Family history of colon cancer    Colonoscopy 05/2018 - internal hemorrhoids.       GERD (gastroesophageal reflux disease)    Appears to be stable - on prilosec.       Hypercholesterolemia    The 10-year ASCVD risk score (Arnett DK, et al., 2019) is: 5.9%   Values used to calculate the score:     Age: 89 years     Sex: Female     Is Non-Hispanic African American: Yes     Diabetic: No     Tobacco smoker: No     Systolic Blood Pressure: 101 mmHg     Is BP treated: Yes      HDL Cholesterol: 47.5 mg/dL     Total Cholesterol: 197 mg/dL  Low cholesterol diet and exercise.  Follow lipid panel.       Relevant Orders   Lipid panel (Completed)   Hepatic function panel (Completed)   Hypothyroidism    On thyroid replacement.  Follow tsh.       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       Mild sleep apnea    CPAP.       Multiple thyroid nodules    Seeing endocrinology.  Follow up ultrasound:  Left mid thyroid nodule meets criteria for continued surveillance, as designated by the newly established ACR TI-RADS criteria. Surveillance ultrasound study recommended to be performed annually up to 5 years.  TFTs wnl.  Recommended continued follow up.        Relevant Orders   TSH (Completed)  Rheumatoid arthritis ([place])    Followed by rheumatology. On MTX.  Also receiving avsola.  Stable.       Other Visit Diagnoses     Hyperglycemia       Relevant Orders   Hemoglobin A1c (Completed)   Visit for screening mammogram       Relevant Orders   MM 3D SCREEN BREAST BILATERAL        Einar Pheasant, MD

## 2021-06-26 ENCOUNTER — Ambulatory Visit: Payer: 59 | Admitting: Podiatry

## 2021-06-26 NOTE — Assessment & Plan Note (Signed)
Followed by Dr Cabbell - [facility].   

## 2021-06-26 NOTE — Assessment & Plan Note (Signed)
Followed by rheumatology. On MTX.  Also receiving avsola.  Stable.

## 2021-06-26 NOTE — Assessment & Plan Note (Signed)
Appears to be stable - on prilosec.  

## 2021-06-26 NOTE — Assessment & Plan Note (Signed)
CPAP.  

## 2021-06-26 NOTE — Assessment & Plan Note (Signed)
Remains on xyzal, singulair and flonase/astelin.  Stable.  Continue.  Follow.   

## 2021-06-27 ENCOUNTER — Encounter: Payer: Self-pay | Admitting: Internal Medicine

## 2021-06-27 NOTE — Assessment & Plan Note (Signed)
On thyroid replacement.  Follow tsh.  

## 2021-06-27 NOTE — Assessment & Plan Note (Signed)
History of IDA. Has seen Dr Vicente Males previously. Had EGD and colonoscopy. Question of need for capsule study or any further GI testing for history of IDA.  Has f/u appt with Dr Vicente Males - 06/2021.

## 2021-06-27 NOTE — Assessment & Plan Note (Signed)
Continue citalopram.   Follow.  

## 2021-06-27 NOTE — Assessment & Plan Note (Signed)
Seeing endocrinology.  Follow up ultrasound:  Left mid thyroid nodule meets criteria for continued surveillance, as designated by the newly established ACR TI-RADS criteria. Surveillance ultrasound study recommended to be performed annually up to 5 years.  TFTs wnl.  Recommended continued follow up.   

## 2021-06-27 NOTE — Assessment & Plan Note (Signed)
Continue losartan. Off chlorthalidone.   Blood pressure as outlined.   Follow pressures.  Follow metabolic panel.

## 2021-06-27 NOTE — Assessment & Plan Note (Signed)
Overall appears to be stable.  Has seen GI.

## 2021-06-27 NOTE — Assessment & Plan Note (Signed)
The 10-year ASCVD risk score (Arnett DK, et al., 2019) is: 5.9%   Values used to calculate the score:     Age: 50 years     Sex: Female     Is Non-Hispanic African American: Yes     Diabetic: No     Tobacco smoker: No     Systolic Blood Pressure: 902 mmHg     Is BP treated: Yes     HDL Cholesterol: 47.5 mg/dL     Total Cholesterol: 197 mg/dL  Low cholesterol diet and exercise.  Follow lipid panel.

## 2021-06-27 NOTE — Assessment & Plan Note (Signed)
Colonoscopy 05/2018 - internal hemorrhoids.  

## 2021-07-08 ENCOUNTER — Encounter: Payer: Self-pay | Admitting: Podiatry

## 2021-07-08 ENCOUNTER — Other Ambulatory Visit: Payer: Self-pay

## 2021-07-08 ENCOUNTER — Ambulatory Visit: Payer: 59 | Admitting: Podiatry

## 2021-07-08 DIAGNOSIS — M069 Rheumatoid arthritis, unspecified: Secondary | ICD-10-CM | POA: Diagnosis not present

## 2021-07-08 DIAGNOSIS — M2011 Hallux valgus (acquired), right foot: Secondary | ICD-10-CM | POA: Diagnosis not present

## 2021-07-08 DIAGNOSIS — M21611 Bunion of right foot: Secondary | ICD-10-CM

## 2021-07-08 DIAGNOSIS — M7672 Peroneal tendinitis, left leg: Secondary | ICD-10-CM

## 2021-07-08 DIAGNOSIS — M2012 Hallux valgus (acquired), left foot: Secondary | ICD-10-CM

## 2021-07-08 DIAGNOSIS — M21612 Bunion of left foot: Secondary | ICD-10-CM

## 2021-07-08 NOTE — Progress Notes (Signed)
°  Subjective:  Patient ID: Tina Parker, female    DOB: 12-22-1970,  MRN: [id number]  Chief Complaint  Patient presents with   Foot Pain    "It's slightly better."    51 y.o. female presents with the above complaint. History confirmed with patient.  She works as a Theme park manager.  She has rheumatoid arthritis.  She takes infliximab infusions as well as methotrexate.  Has been worsening recently and have shorten her infusion interval.  Also has left ankle pain, denies any history of injuries.  Pain is mostly over the bunions on her bilateral foot and the big toe joint  Interval history: Doing better after the last injections but not her percent pain-free.  They are still working on getting her rheumatoid regimen where they want to.  The peroneal tendinitis is resolved after home therapy  Objective:  Physical Exam: warm, good capillary refill, no trophic changes or ulcerative lesions, normal DP and PT pulses, and normal sensory exam. Left Foot: No pain lateral ankle, 5 out of 5 strength, bunion present less painful today less edema Right Foot: Bunion present less painful less edema today No images are attached to the encounter.  Radiographs: Multiple views x-ray of both feet: Bilateral hallux valgus deformity with mild lateral joint space narrowing, slight dorsal spurring Assessment:   1. Peroneal tendinitis, left   2. Hallux valgus with bunions, left   3. Hallux valgus with bunions, right   4. Rheumatoid arthritis involving both feet, unspecified whether rheumatoid factor present [facility])      Plan:  Patient was evaluated and treated and all questions answered.   Peroneal tendinitis is resolved with home therapy plan.  Return as needed for this.  Her bunions and joint pain I still think are likely primarily due to her rheumatoid arthritis.  The bunions are a factor as well but until her rheumatoid is more under control all think we can reliably see where her pain is exactly.  She  does not have pain over the dorsal medial eminence more so in the joint.  She will return as needed for further injections, I discussed with her would not want to do them more than every 3 months or so.  Surgery for the joint would likely be a last resort option.   Return if symptoms worsen or fail to improve.

## 2021-07-23 DIAGNOSIS — J301 Allergic rhinitis due to pollen: Secondary | ICD-10-CM | POA: Insufficient documentation

## 2021-07-23 DIAGNOSIS — L209 Atopic dermatitis, unspecified: Secondary | ICD-10-CM | POA: Insufficient documentation

## 2021-07-23 DIAGNOSIS — R062 Wheezing: Secondary | ICD-10-CM | POA: Insufficient documentation

## 2021-07-24 ENCOUNTER — Encounter: Payer: Self-pay | Admitting: Gastroenterology

## 2021-07-24 ENCOUNTER — Ambulatory Visit (INDEPENDENT_AMBULATORY_CARE_PROVIDER_SITE_OTHER): Payer: 59 | Admitting: Gastroenterology

## 2021-07-24 VITALS — BP 159/103 | HR 85 | Temp 99.0°F | Wt 146.8 lb

## 2021-07-24 DIAGNOSIS — D539 Nutritional anemia, unspecified: Secondary | ICD-10-CM

## 2021-07-24 DIAGNOSIS — K219 Gastro-esophageal reflux disease without esophagitis: Secondary | ICD-10-CM | POA: Diagnosis not present

## 2021-07-24 NOTE — Progress Notes (Signed)
Jonathon Bellows MD, MRCP(U.K) [address]  [place], [place] [address]  Main: [phone]  Fax: [phone]   Primary Care Physician: Einar Pheasant, MD  Primary Gastroenterologist:  Dr. Jonathon Bellows   Chief complaint: Discuss blood results   HPI: Tina Parker is a 51 y.o. female  Summary of history : Referred for dysphagia  In 07/2019,previously a patient of Dr. Vira Agar at [place] [facility] last seen at their office in 03/19/2018 for acid reflux.     06/18/2018 colonoscopy: Normal except for internal hemorrhoids 06/18/2018 EGD: Schatzki's ring dilated to 16 mm no biopsies of the esophagus taken biopsies of the stomach taken due to suspicion for gastritis. Which were negative for pathology. 07/15/2019: EGD: Gastritis, GE junction stricture noted and dilated to 18 mm. Normal gastric and esophageal biopsies.        On steroids for her rheumatoid arthritis.  Occasional use of NSAIDs.  Been on Dexilant for reflux and Metamucil for constipation.       Interval history 05/02/2020-07/24/2021 Recent labs in December 2022 demonstrate a hemoglobin of 11.1 g with an MCV of 100.  Iron studies were normal.  No B12 or folate checked.  Since her last visit she states that the [place] was not covered by her insurance and she changed to Prilosec twice a day and presently takes it only once a day.  She states the reason she is here to see me is for the anemia noted on her labs in December.    Current Outpatient Medications  Medication Sig Dispense Refill   Azelastine-Fluticasone 137-50 MCG/ACT SUSP Place 1 spray into both nostrils 2 (two) times daily.     budesonide-formoterol (SYMBICORT) 80-4.5 MCG/ACT inhaler 1-2 puffs twice daily (rinse mouth after use) 1 each 5   cholecalciferol (VITAMIN D3) 25 MCG (1000 UNIT) tablet Take 1,000 Units by mouth daily.     citalopram (CELEXA) 40 MG tablet TAKE 1 TABLET(40 MG) BY MOUTH DAILY 30 tablet 5   desloratadine (CLARINEX) 5 MG tablet  Take 5 mg by mouth daily.     folic acid (FOLVITE) 1 MG tablet Take 1 mg by mouth daily.     inFLIXimab-axxq (AVSOLA) 100 MG SOLR Avsola 100 mg intravenous solution  Inject by intravenous route.     losartan (COZAAR) 100 MG tablet TAKE 1 TABLET(100 MG) BY MOUTH DAILY 30 tablet 2   medroxyPROGESTERone (DEPO-PROVERA) 150 MG/ML injection Inject 150 mg into the muscle every 3 (three) months.     methotrexate (RHEUMATREX) 2.5 MG tablet Take 22.5 mg by mouth every Monday. Caution:Chemotherapy. Protect from light. Take 9 tabs po weekly with meals     montelukast (SINGULAIR) 10 MG tablet take 1 tablet by mouth at bedtime 30 tablet 3   omeprazole (PRILOSEC) 40 MG capsule Take 1 capsule (40 mg total) by mouth 2 (two) times daily. 60 capsule 3   Semaglutide-Weight Management 2.4 MG/0.75ML SOAJ Inject 2.4 mg into the skin once a week for 28 days. 9 mL 1   valACYclovir (VALTREX) 500 MG tablet Take 1 tablet bid x 3 days.  May repeat prn with flares. 30 tablet 0   vitamin B-12 (CYANOCOBALAMIN) 100 MCG tablet Take 100 mcg by mouth daily.     Current Facility-Administered Medications  Medication Dose Route Frequency Provider Last Rate Last Admin   betamethasone acetate-betamethasone sodium phosphate (CELESTONE) injection 12 mg  12 mg Other Once Magnus Sinning, MD        Allergies as of 07/24/2021 -  Review Complete 07/24/2021  Allergen Reaction Noted   Cephalexin Shortness Of Breath 07/30/2020   Inh [isoniazid] Other (See Comments) 07/30/2012   Oxycodone Shortness Of Breath 07/30/2020    ROS:  General: Negative for anorexia, weight loss, fever, chills, fatigue, weakness. ENT: Negative for hoarseness, difficulty swallowing , nasal congestion. CV: Negative for chest pain, angina, palpitations, dyspnea on exertion, peripheral edema.  Respiratory: Negative for dyspnea at rest, dyspnea on exertion, cough, sputum, wheezing.  GI: See history of present illness. GU:  Negative for dysuria, hematuria, urinary  incontinence, urinary frequency, nocturnal urination.  Endo: Negative for unusual weight change.    Physical Examination:   BP (!) 159/103    Pulse 85    Temp 99 F (37.2 C) (Oral)    Wt 146 lb 12.8 oz (66.6 kg)    BMI 27.74 kg/m   General: Well-nourished, well-developed in no acute distress.  Eyes: No icterus. Conjunctivae pink. Neuro: Alert and oriented x 3.  Grossly intact. Skin: Warm and dry, no jaundice.   Psych: Alert and cooperative, normal mood and affect.   Imaging Studies: No results found.  Assessment and Plan:   Tina Parker is a 51 y.o. y/o female here to see me for anemia noted on recent labs.  Her MCV is over 100 with normal iron studies and a ferritin over 200 which makes it very unlikely that this is iron deficiency anemia that this is a macrocytic anemia.  She denies any excess alcohol consumption and has had a positive would be to check her B12 and folate levels.  Long-term PPI use has been associated with low B12 levels.  If the B12 levels are low she will require supplementation.  It is also very likely possible that the macrocytosis is related to methotrexate use.  I explained to her that she would not benefit from an endoscopy or colonoscopy with normal iron levels and its not indicated.  In terms of her acid reflux I suggested that we should perform Prilosec to Pepcid once a day at night.  Explained to her that in to use the lowest dose for the shortest period of time and    Dr Jonathon Bellows  MD,MRCP [facility]) Follow up in 6 to 8 weeks video visit

## 2021-07-25 LAB — B12 AND FOLATE PANEL
Folate: >20 ng/mL (ref >3.0)
Vitamin B-12: 1299 pg/mL — ABNORMAL HIGH (ref 232–1245)

## 2021-08-25 ENCOUNTER — Other Ambulatory Visit: Payer: Self-pay | Admitting: Internal Medicine

## 2021-08-26 ENCOUNTER — Ambulatory Visit
Admission: RE | Admit: 2021-08-26 | Discharge: 2021-08-26 | Disposition: A | Discharge status: Home / Self Care | Payer: 59 | Source: Ambulatory Visit | Attending: Internal Medicine | Admitting: Internal Medicine

## 2021-08-26 ENCOUNTER — Other Ambulatory Visit: Payer: Self-pay

## 2021-08-26 DIAGNOSIS — Z1231 Encounter for screening mammogram for malignant neoplasm of breast: Secondary | ICD-10-CM | POA: Insufficient documentation

## 2021-09-02 ENCOUNTER — Encounter: Payer: Self-pay | Admitting: Internal Medicine

## 2021-09-02 ENCOUNTER — Other Ambulatory Visit: Payer: Self-pay

## 2021-09-02 ENCOUNTER — Ambulatory Visit: Payer: 59 | Admitting: Internal Medicine

## 2021-09-02 DIAGNOSIS — Z9109 Other allergy status, other than to drugs and biological substances: Secondary | ICD-10-CM

## 2021-09-02 DIAGNOSIS — E042 Nontoxic multinodular goiter: Secondary | ICD-10-CM | POA: Diagnosis not present

## 2021-09-02 DIAGNOSIS — I10 Essential (primary) hypertension: Secondary | ICD-10-CM

## 2021-09-02 DIAGNOSIS — Z800 Family history of malignant neoplasm of digestive organs: Secondary | ICD-10-CM

## 2021-09-02 DIAGNOSIS — G4730 Sleep apnea, unspecified: Secondary | ICD-10-CM | POA: Diagnosis not present

## 2021-09-02 DIAGNOSIS — K219 Gastro-esophageal reflux disease without esophagitis: Secondary | ICD-10-CM | POA: Diagnosis not present

## 2021-09-02 DIAGNOSIS — D329 Benign neoplasm of meninges, unspecified: Secondary | ICD-10-CM

## 2021-09-02 DIAGNOSIS — E039 Hypothyroidism, unspecified: Secondary | ICD-10-CM

## 2021-09-02 DIAGNOSIS — D649 Anemia, unspecified: Secondary | ICD-10-CM

## 2021-09-02 DIAGNOSIS — M059 Rheumatoid arthritis with rheumatoid factor, unspecified: Secondary | ICD-10-CM

## 2021-09-02 DIAGNOSIS — F419 Anxiety disorder, unspecified: Secondary | ICD-10-CM

## 2021-09-02 DIAGNOSIS — E7800 Pure hypercholesterolemia, unspecified: Secondary | ICD-10-CM

## 2021-09-02 MED ORDER — WEGOVY 1.7 MG/0.75ML ~~LOC~~ SOAJ
1.7000 mg | SUBCUTANEOUS | 2 refills | Status: DC
Start: 2021-09-02 — End: 2022-01-09

## 2021-09-02 MED ORDER — AMLODIPINE BESYLATE 2.5 MG PO TABS: 2.5000 mg | ORAL_TABLET | Freq: Every day | ORAL | 2 refills | Status: DC

## 2021-09-02 NOTE — Progress Notes (Unsigned)
Patient ID: Tina Parker, female   DOB: 03/14/1971, 51 y.o.   MRN: [id number]   Subjective:    Patient ID: Tina Parker, female    DOB: May 18, 1971, 51 y.o.   MRN: [id number]  This visit occurred during the SARS-CoV-2 public health emergency.  Safety protocols were in place, including screening questions prior to the visit, additional usage of staff PPE, and extensive cleaning of exam room while observing appropriate contact time as indicated for disinfecting solutions.   Patient here for a scheduled follow up.   Chief Complaint  Patient presents with   Follow-up   Hypertension   .   HPI Here to follow up regarding her blood pressure, arthritis and GERD.  Sees rheumatology for RA.  On rinvoq and MTX.  Just evaluated 08/26/21.  MTX lowered.  Recommended f/u in 4 months.  Diagnosed with covid 08/02/21.  Better.  Saw Dr Vicente Males 07/24/21.  B12 and folate levels ok. Felt no further GI w/up warranted at that time.  States she is due a f/u and plans to call to schedule.  No chest pain or sob reported.  Some allergy symptoms.  Uses rescue inhaler this am.  No chest tightness.  Off vitamin D.  Discussed.  She has done well with her diet and weight loss.  Wants to decrease wegovy to 1.7.  blood pressure elevated - 130/93, 143/78  and 117/89.     Past Medical History:  Diagnosis Date   Allergic rhinitis    Anemia    Arthritis    RA   Carpal tunnel syndrome    Dysphagia    Esophageal spasm 01/16/2014   GERD (gastroesophageal reflux disease)    Graves disease    remission, no ablation, positive medical treatment   History of hiatal hernia    Hypertension    Migraine headache    migraines   PPD positive    hepatitis secondary to [place]   Pure hypercholesterolemia    Rheumatoid arthritis(714.0)    positive anti CCP antibodies, positive RF, oligo-articular, MTX   Sciatica    Past Surgical History:  Procedure Laterality Date   CARPAL TUNNEL RELEASE Right    [place]    COLONOSCOPY  03/14/2002, 08/16/2007, 03/07/2013   [facility]-father   COLONOSCOPY WITH PROPOFOL N/A 06/21/2018   Procedure: COLONOSCOPY WITH PROPOFOL;  Surgeon: Manya Silvas, MD;  Location: [facility] ENDOSCOPY;  Service: Endoscopy;  Laterality: N/A;   CRANIOTOMY Left 06/25/2017   Procedure: CRANIOTOMY TEMPORAL LEFT FOR TUMOR RESECTION;  Surgeon: Ashok Pall, MD;  Location: [place];  Service: Neurosurgery;  Laterality: Left;   ESOPHAGOGASTRODUODENOSCOPY  03/14/2002, 03/07/2013   ESOPHAGOGASTRODUODENOSCOPY (EGD) WITH PROPOFOL N/A 06/21/2018   Procedure: ESOPHAGOGASTRODUODENOSCOPY (EGD) WITH PROPOFOL;  Surgeon: Manya Silvas, MD;  Location: [facility] ENDOSCOPY;  Service: Endoscopy;  Laterality: N/A;   ESOPHAGOGASTRODUODENOSCOPY (EGD) WITH PROPOFOL N/A 07/15/2019   Procedure: ESOPHAGOGASTRODUODENOSCOPY (EGD) WITH PROPOFOL;  Surgeon: Jonathon Bellows, MD;  Location[facility] END[facility]COPY;  Service: Gastroenterology;  Laterality: N/A;   Post Septoplasty and turbinate reduction  2007   TRIGGER FINGER RELEASE     Family History  Problem Relation Age of Onset   Cancer Mother        Breast Cancer   Breast cancer Mother 1   Cancer Father        Colon Cancer   Heart disease Father        Hx of MI   Hypertension Father    Diabetes Father    Hyperlipidemia  Father    Cancer Maternal Grandmother        lung cancer   Cancer Maternal Uncle        esophageal   Social History   Socioeconomic History   Marital status: Married    Spouse name: Not on file   Number of children: 2   Years of education: Not on file   Highest education level: Not on file  Occupational History   Occupation: Hairstylist  Tobacco Use   Smoking status: Never   Smokeless tobacco: Never  Vaping Use   Vaping Use: Never used  Substance and Sexual Activity   Alcohol use: Yes    Alcohol/week: 0.0 standard drinks    Comment: wine cooler 2 drinks 2x a week   Drug use: No   Sexual activity: Not on file  Other Topics Concern   Not on file   Social History Narrative   Hairdresser    Social Determinants of Health   Financial Resource Strain: Low Risk    Difficulty of Paying Living Expenses: Not hard at all  Food Insecurity: No Food Insecurity   Worried About Charity fundraiser in the Last Year: Never true   Arboriculturist in the Last Year: Never true  Transportation Needs: Not on file  Physical Activity: Insufficiently Active   Days of Exercise per Week: 2 days   Minutes of Exercise per Session: 20 min  Stress: Not on file  Social Connections: Not on file     Review of Systems  Constitutional:  Positive for fatigue. Negative for appetite change and unexpected weight change.  HENT:  Positive for congestion and postnasal drip.   Respiratory:  Negative for cough, chest tightness and shortness of breath.   Cardiovascular:  Negative for chest pain, palpitations and leg swelling.  Gastrointestinal:  Negative for abdominal pain, diarrhea, nausea and vomiting.  Genitourinary:  Negative for difficulty urinating and dysuria.  Musculoskeletal:  Negative for joint swelling and myalgias.  Skin:  Negative for color change and rash.  Neurological:  Negative for dizziness, light-headedness and headaches.  Psychiatric/Behavioral:  Negative for agitation and dysphoric mood.       Objective:     BP 138/86 (BP Location: Left Arm, Patient Position: Sitting, Cuff Size: Normal)    Pulse 96    Temp 98.2 F (36.8 C) (Oral)    Ht '5\' 1"'$  (1.549 m)    Wt 140 lb 12.8 oz (63.9 kg)    SpO2 98%    BMI 26.60 kg/m  Wt Readings from Last 3 Encounters:  09/02/21 140 lb 12.8 oz (63.9 kg)  07/24/21 146 lb 12.8 oz (66.6 kg)  06/25/21 149 lb (67.6 kg)    Physical Exam Vitals reviewed.  Constitutional:      General: She is not in acute distress.    Appearance: Normal appearance.  HENT:     Head: Normocephalic and atraumatic.     Right Ear: External ear normal.     Left Ear: External ear normal.  Eyes:     General: No scleral icterus.        Right eye: No discharge.        Left eye: No discharge.     Conjunctiva/sclera: Conjunctivae normal.  Neck:     Thyroid: No thyromegaly.     Comments: Thyroid fullness.  Cardiovascular:     Rate and Rhythm: Normal rate and regular rhythm.  Pulmonary:     Effort: No respiratory distress.  Breath sounds: Normal breath sounds. No wheezing.  Abdominal:     General: Bowel sounds are normal.     Palpations: Abdomen is soft.     Tenderness: There is no abdominal tenderness.  Musculoskeletal:        General: No swelling or tenderness.     Cervical back: Neck supple. No tenderness.  Lymphadenopathy:     Cervical: No cervical adenopathy.  Skin:    Findings: No erythema or rash.  Neurological:     Mental Status: She is alert.  Psychiatric:        Mood and Affect: Mood normal.        Behavior: Behavior normal.     Outpatient Encounter Medications as of 09/02/2021  Medication Sig   amLODipine (NORVASC) 2.5 MG tablet Take 1 tablet (2.5 mg total) by mouth daily.   Azelastine-Fluticasone 137-50 MCG/ACT SUSP Place 1 spray into both nostrils 2 (two) times daily.   budesonide-formoterol (SYMBICORT) 80-4.5 MCG/ACT inhaler 1-2 puffs twice daily (rinse mouth after use)   citalopram (CELEXA) 40 MG tablet TAKE 1 TABLET(40 MG) BY MOUTH DAILY   desloratadine (CLARINEX) 5 MG tablet Take 5 mg by mouth daily.   folic acid (FOLVITE) 1 MG tablet Take 1 mg by mouth daily.   losartan (COZAAR) 100 MG tablet TAKE 1 TABLET(100 MG) BY MOUTH DAILY   methotrexate (RHEUMATREX) 2.5 MG tablet Take 22.5 mg by mouth every Monday. Caution:Chemotherapy. Protect from light. Take 9 tabs po weekly with meals   omeprazole (PRILOSEC) 40 MG capsule Take 1 capsule (40 mg total) by mouth 2 (two) times daily.   RINVOQ 15 MG TB24 Take 1 tablet by mouth daily.   valACYclovir (VALTREX) 500 MG tablet Take 1 tablet bid x 3 days.  May repeat prn with flares.   WEGOVY 2.4 MG/0.75ML SOAJ SMARTSIG:2.4 Milligram(s) Topical Once a Week    [DISCONTINUED] inFLIXimab-axxq (AVSOLA) 100 MG SOLR Avsola 100 mg intravenous solution  Inject by intravenous route.   [DISCONTINUED] medroxyPROGESTERone (DEPO-PROVERA) 150 MG/ML injection Inject 150 mg into the muscle every 3 (three) months.   [DISCONTINUED] montelukast (SINGULAIR) 10 MG tablet take 1 tablet by mouth at bedtime   cholecalciferol (VITAMIN D3) 25 MCG (1000 UNIT) tablet Take 1,000 Units by mouth daily. (Patient not taking: Reported on 09/02/2021)   vitamin B-12 (CYANOCOBALAMIN) 100 MCG tablet Take 100 mcg by mouth daily. (Patient not taking: Reported on 09/02/2021)   Facility-Administered Encounter Medications as of 09/02/2021  Medication   betamethasone acetate-betamethasone sodium phosphate (CELESTONE) injection 12 mg     Lab Results  Component Value Date   WBC 5.4 06/25/2021   HGB 11.1 (L) 06/25/2021   HCT 31.0 (L) 06/25/2021   PLT 387.0 06/25/2021   GLUCOSE 85 06/25/2021   CHOL 197 06/25/2021   TRIG 115.0 06/25/2021   HDL 47.50 06/25/2021   LDLCALC 126 (H) 06/25/2021   ALT 14 06/25/2021   AST 20 06/25/2021   NA 137 06/25/2021   K 4.0 06/25/2021   CL 105 06/25/2021   CREATININE 0.88 06/25/2021   BUN 10 06/25/2021   CO2 26 06/25/2021   TSH 3.10 06/25/2021   HGBA1C 5.3 06/25/2021    MM 3D SCREEN BREAST BILATERAL  Result Date: 08/27/2021 CLINICAL DATA:  Screening. EXAM: DIGITAL SCREENING BILATERAL MAMMOGRAM WITH TOMOSYNTHESIS AND CAD TECHNIQUE: Bilateral screening digital craniocaudal and mediolateral oblique mammograms were obtained. Bilateral screening digital breast tomosynthesis was performed. The images were evaluated with computer-aided detection. COMPARISON:  Previous exam(s). ACR Breast Density Category d: The  breast tissue is extremely dense, which lowers the sensitivity of mammography FINDINGS: There are no findings suspicious for malignancy. IMPRESSION: No mammographic evidence of malignancy. A result letter of this screening mammogram will be mailed  directly to the patient. RECOMMENDATION: Screening mammogram in one year. (Code:SM-B-01Y) BI-RADS CATEGORY  1: Negative. Electronically Signed   By: Everlean Alstrom M.D.   On: 08/27/2021 09:35       Assessment & Plan:   Problem List Items Addressed This Visit   None    Einar Pheasant, MD

## 2021-09-03 ENCOUNTER — Ambulatory Visit: Payer: Self-pay

## 2021-09-03 ENCOUNTER — Encounter: Payer: Self-pay | Admitting: Internal Medicine

## 2021-09-03 ENCOUNTER — Ambulatory Visit: Payer: 59 | Admitting: Orthopaedic Surgery

## 2021-09-03 ENCOUNTER — Ambulatory Visit: Payer: Self-pay | Admitting: Pharmacist

## 2021-09-03 ENCOUNTER — Encounter: Payer: Self-pay | Admitting: Orthopaedic Surgery

## 2021-09-03 VITALS — Ht 61.0 in | Wt 142.0 lb

## 2021-09-03 DIAGNOSIS — M25561 Pain in right knee: Secondary | ICD-10-CM

## 2021-09-03 DIAGNOSIS — M25551 Pain in right hip: Secondary | ICD-10-CM | POA: Diagnosis not present

## 2021-09-03 DIAGNOSIS — G8929 Other chronic pain: Secondary | ICD-10-CM

## 2021-09-03 MED ORDER — METHYLPREDNISOLONE ACETATE 40 MG/ML IJ SUSP
40.0000 mg | INTRAMUSCULAR | Status: AC | PRN
  Administered 2021-09-03: 40 mg via INTRA_ARTICULAR

## 2021-09-03 MED ORDER — BUPIVACAINE HCL 0.5 % IJ SOLN
2.0000 mL | INTRAMUSCULAR | Status: AC | PRN
  Administered 2021-09-03: 2 mL via INTRA_ARTICULAR

## 2021-09-03 MED ORDER — LIDOCAINE HCL 1 % IJ SOLN
2.0000 mL | INTRAMUSCULAR | Status: AC | PRN
  Administered 2021-09-03: 2 mL

## 2021-09-03 NOTE — Progress Notes (Signed)
? ?Office Visit Note ?  ?Patient: Tina Parker           ?Date of Birth: 01-20-1971           ?MRN: [id number] ?Visit Date: 09/03/2021 ?             ?Requested by: Einar Pheasant, MD ?[address] ?[address] ?[place],  [place] [address] ?PCP: Einar Pheasant, MD ? ? ?Assessment & Plan: ?Visit Diagnoses:  ?1. Pain in right hip   ?2. Chronic pain of right knee   ? ? ?Plan: Impression is chronic right knee and hip pain.  She has no significant degenerative changes of the right hip or knee joint.  Based on options we performed cortisone injection for the right knee today and we will get her set up with a injection in her right hip with Dr. Ernestina Patches. ? ?Follow-Up Instructions: No follow-ups on file.  ? ?Orders:  ?Orders Placed This Encounter  ?Procedures  ? XR KNEE 3 VIEW RIGHT  ? XR HIP UNILAT W OR W/O PELVIS 2-3 VIEWS RIGHT  ? Ambulatory referral to Physical Medicine Rehab  ? ?No orders of the defined types were placed in this encounter. ? ? ? ? Procedures: ?Large Joint Inj: R knee on 09/03/2021 12:38 PM ?Indications: pain ?Details: 22 G needle ? ?Arthrogram: No ? ?Medications: 40 mg methylPREDNISolone acetate 40 MG/ML; 2 mL lidocaine 1 %; 2 mL bupivacaine 0.5 % ?Consent was given by the patient. Patient was prepped and draped in the usual sterile fashion.  ? ? ? ? ?Clinical Data: ?No additional findings. ? ? ?Subjective: ?Chief Complaint  ?Patient presents with  ? Right Knee - Pain  ? Right Hip - Pain  ? ? ?HPI ? ?Tina Parker returns today for evaluation of right hip and knee pain.  She had a previous cortisone injection of the right hip which helped significantly.  She has pain underneath the patella and worse at night.  She feels swelling and burning pain is worse with using steps.  She has lateral and groin pain of the hip.  She does have rheumatoid arthritis and is on invoke. ? ?Review of Systems ? ? ?Objective: ?Vital Signs: Ht '5\' 1"'$  (1.549 m)   Wt 142 lb (64.4 kg)   BMI 26.83 kg/m?  ? ?Physical  Exam ? ?Ortho Exam ? ?Examination right knee shows no effusion.  1+ patellofemoral crepitus with range of motion.  Collaterals and cruciates are stable.  Range of motion is well-preserved. ? ?Examination of the right hip shows no significant tenderness palpation.  Preserved range of motion.  No sciatic tension signs. ? ?Specialty Comments:  ?No specialty comments available. ? ?Imaging: ?XR HIP UNILAT W OR W/O PELVIS 2-3 VIEWS RIGHT ? ?Result Date: 09/03/2021 ?No acute or structural abnormalities ? ?XR KNEE 3 VIEW RIGHT ? ?Result Date: 09/03/2021 ?No acute or structural abnormalities  ? ? ?PMFS History: ?Patient Active Problem List  ? Diagnosis Date Noted  ? Allergic rhinitis due to pollen 07/23/2021  ? Atopic dermatitis 07/23/2021  ? Wheezing 07/23/2021  ? Multiple thyroid nodules 05/16/2021  ? Arthritis of right wrist 05/06/2021  ? Body mass index (BMI) 29.0-29.9, adult 03/12/2021  ? Wrist pain, right 06/24/2020  ? Fracture of scaphoid bone of wrist 06/11/2020  ? Acquired trigger finger of left little finger 12/12/2019  ? Pain of left hand 10/17/2019  ? Chronic venous insufficiency 09/28/2019  ? Allergic rhinitis 09/26/2019  ? Carpal tunnel syndrome 09/26/2019  ? Hiatal hernia 09/26/2019  ?  Hx of migraine headaches 09/26/2019  ? Positive PPD 09/26/2019  ? Leg pain 09/12/2019  ? Mild sleep apnea 08/08/2019  ? Thyroid nodule 05/18/2019  ? Thyroid fullness 05/01/2019  ? Bilateral wrist pain 02/21/2019  ? Radial styloid tenosynovitis 10/27/2018  ? Ependymoma of brain ([place]) 07/15/2018  ? Anxiety 06/08/2018  ? Sinusitis 06/08/2018  ? History of meningioma of the brain 12/21/2017  ? Herpes 07/23/2017  ? 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 Pain of both hip joints 05/18/2017  ? Vitamin D deficiency 09/21/2016  ? Cough 01/07/2015  ? Difficulty sleeping 01/07/2015  ? Health care maintenance 08/02/2014  ? Snoring 03/19/2014  ? Dysphagia 01/15/2014  ? BMI 34.0-34.9,adult 12/11/2013  ? Skin  lesion 12/11/2013  ? Family history of breast cancer 10/22/2013  ? Environmental allergies 10/22/2013  ? Essential hypertension, benign 09/15/2012  ? Hypercholesterolemia 09/15/2012  ? Rheumatoid arthritis ([place]) 09/15/2012  ? GERD (gastroesophageal reflux disease) 09/15/2012  ? Family history of colon cancer 09/15/2012  ? Anemia 09/15/2012  ? Hypothyroidism 09/15/2012  ? ?Past Medical History:  ?Diagnosis Date  ? Allergic rhinitis   ? Anemia   ? Arthritis   ? RA  ? Carpal tunnel syndrome   ? Dysphagia   ? Esophageal spasm 01/16/2014  ? GERD (gastroesophageal reflux disease)   ? Graves disease   ? remission, no ablation, positive medical treatment  ? History of hiatal hernia   ? Hypertension   ? Migraine headache   ? migraines  ? PPD positive   ? hepatitis secondary to [place]  ? Pure hypercholesterolemia   ? Rheumatoid arthritis(714.0)   ? positive anti CCP antibodies, positive RF, oligo-articular, MTX  ? Sciatica   ?  ?Family History  ?Problem Relation Age of Onset  ? Cancer Mother   ?     Breast Cancer  ? Breast cancer Mother 26  ? Cancer Father   ?     Colon Cancer  ? Heart disease Father   ?     Hx of MI  ? Hypertension Father   ? Diabetes Father   ? Hyperlipidemia Father   ? Cancer Maternal Grandmother   ?     lung cancer  ? Cancer Maternal Uncle   ?     esophageal  ?  ?Past Surgical History:  ?Procedure Laterality Date  ? CARPAL TUNNEL RELEASE Right   ? [place]  ? COLONOSCOPY  03/14/2002, 08/16/2007, 03/07/2013  ? [facility]-father  ? COLONOSCOPY WITH PROPOFOL N/A 06/21/2018  ? Procedure: COLONOSCOPY WITH PROPOFOL;  Surgeon: Manya Silvas, MD;  Location: [facility] ENDOSCOPY;  Service: Endoscopy;  Laterality: N/A;  ? CRANIOTOMY Left 06/25/2017  ? Procedure: CRANIOTOMY TEMPORAL LEFT FOR TUMOR RESECTION;  Surgeon: Ashok Pall, MD;  Location: [facility];  Service: Neurosurgery;  Laterality: Left;  ? ESOPHAGOGASTRODUODENOSCOPY  03/14/2002, 03/07/2013  ? ESOPHAGOGASTRODUODENOSCOPY (EGD) WITH PROPOFOL N/A 06/21/2018  ?  Procedure: ESOPHAGOGASTRODUODENOSCOPY (EGD) WITH PROPOFOL;  Surgeon: Manya Silvas, MD;  Location: [facility] ENDOSCOPY;  Service: Endoscopy;  Laterality: N/A;  ? ESOPHAGOGASTRODUODENOSCOPY (EGD) WITH PROPOFOL N/A 07/15/2019  ? Procedure: ESOPHAGOGASTRODUODENOSCOPY (EGD) WITH PROPOFOL;  Surgeon: Jonathon Bellows, MD;  Location: [facility] ENDOSCOPY;  Service: Gastroenterology;  Laterality: N/A;  ? Post Septoplasty and turbinate reduction  2007  ? TRIGGER FINGER RELEASE    ? ?Social History  ? ?Occupational History  ? Occupation: Hairstylist  ?Tobacco Use  ? Smoking status: Never  ? Smokeless tobacco: Never  ?Vaping Use  ?  Vaping Use: Never used  ?Substance and Sexual Activity  ? Alcohol use: Yes  ?  Alcohol/week: 0.0 standard drinks  ?  Comment: wine cooler 2 drinks 2x a week  ? Drug use: No  ? Sexual activity: Not on file  ? ? ? ? ? ? ?

## 2021-09-03 NOTE — Chronic Care Management (AMB) (Signed)
?  Chronic Care Management  ? ?Note ? ?09/03/2021 ?Name: Tina Parker MRN: [id number] DOB: 06-25-1971 ? ? ? ?Closing pharmacy CCM case at this time. Patient has clinic contact information for future questions or concerns.  ? ?Catie Darnelle Maffucci, PharmD, [place], CPP ?Clinical Pharmacist ?Therapist, music at [employer] ?[phone] ? ?

## 2021-09-05 ENCOUNTER — Telehealth: Payer: 59 | Admitting: Gastroenterology

## 2021-09-10 ENCOUNTER — Encounter: Payer: Self-pay | Admitting: Internal Medicine

## 2021-09-10 NOTE — Assessment & Plan Note (Signed)
History of IDA.  Has seen Dr Anna previously.  Had EGD and colonoscopy.  Question of need for capsule study or any further GI testing for history of IDA.  Had f/u appt with Dr Anna - 06/2021.  Felt no further w/up warranted.   

## 2021-09-10 NOTE — Assessment & Plan Note (Signed)
On thyroid replacement.  Follow tsh.  

## 2021-09-10 NOTE — Assessment & Plan Note (Signed)
Followed by Dr Cabbell - [facility].   

## 2021-09-10 NOTE — Assessment & Plan Note (Signed)
The 10-year ASCVD risk score (Arnett DK, et al., 2019) is: 6.2% ?  Values used to calculate the score: ?    Age: 51 years ?    Sex: Female ?    Is Non-Hispanic African American: Yes ?    Diabetic: No ?    Tobacco smoker: No ?    Systolic Blood Pressure: 638 mmHg ?    Is BP treated: Yes ?    HDL Cholesterol: 47.5 mg/dL ?    Total Cholesterol: 197 mg/dL  ?Low cholesterol diet and exercise.  Follow lipid panel.  ?

## 2021-09-10 NOTE — Assessment & Plan Note (Signed)
Followed by rheumatology. On MTX.  Also receiving rinvoq.  Stable.  

## 2021-09-10 NOTE — Assessment & Plan Note (Signed)
Colonoscopy 05/2018 - internal hemorrhoids.  

## 2021-09-10 NOTE — Assessment & Plan Note (Signed)
Seeing endocrinology.  Follow up ultrasound:  Left mid thyroid nodule meets criteria for continued surveillance, as designated by the newly established ACR TI-RADS criteria. Surveillance ultrasound study recommended to be performed annually up to 5 years.  TFTs wnl.  Recommended continued follow up.   

## 2021-09-10 NOTE — Assessment & Plan Note (Signed)
Blood pressure elevated as outlined.  On losartan.  Add amlodipine 2.'5mg'$  q day.  Follow pressures.  Follow metabolic panel.  ?

## 2021-09-10 NOTE — Assessment & Plan Note (Signed)
Continue citalopram.   Follow.  

## 2021-09-10 NOTE — Assessment & Plan Note (Signed)
CPAP.  

## 2021-09-10 NOTE — Assessment & Plan Note (Signed)
Continue current allergy regimen.  Has rescue inhaler if needed.  Follow.  

## 2021-09-10 NOTE — Assessment & Plan Note (Signed)
Appears to be stable - on prilosec.  

## 2021-09-23 ENCOUNTER — Ambulatory Visit: Payer: Self-pay

## 2021-09-23 ENCOUNTER — Ambulatory Visit: Payer: 59 | Admitting: Physical Medicine and Rehabilitation

## 2021-09-23 ENCOUNTER — Encounter: Payer: Self-pay | Admitting: Physical Medicine and Rehabilitation

## 2021-09-23 ENCOUNTER — Other Ambulatory Visit: Payer: Self-pay

## 2021-09-23 DIAGNOSIS — M25551 Pain in right hip: Secondary | ICD-10-CM

## 2021-09-23 NOTE — Progress Notes (Signed)
Pt state right hip pain. Pt state walking and standing makes the pain worse. Pt state she takes over the counter pain meds to help ease her pain. ? ?Numeric Pain Rating Scale and Functional Assessment ?Average Pain 3 ? ? ?In the last MONTH (on 0-10 scale) has pain interfered with the following? ? ?1. General activity like being  able to carry out your everyday physical activities such as walking, climbing stairs, carrying groceries, or moving a chair?  ?Rating(8) ? ? ? -BT, -Dye Allergies. ? ?

## 2021-09-29 ENCOUNTER — Encounter: Payer: Self-pay | Admitting: Internal Medicine

## 2021-09-29 DIAGNOSIS — M25551 Pain in right hip: Secondary | ICD-10-CM | POA: Insufficient documentation

## 2021-09-29 MED ORDER — TRIAMCINOLONE ACETONIDE 40 MG/ML IJ SUSP
60.0000 mg | INTRAMUSCULAR | Status: AC | PRN
  Administered 2021-09-23: 60 mg via INTRA_ARTICULAR

## 2021-09-29 MED ORDER — BUPIVACAINE HCL 0.25 % IJ SOLN
4.0000 mL | INTRAMUSCULAR | Status: AC | PRN
  Administered 2021-09-23: 4 mL via INTRA_ARTICULAR

## 2021-09-29 NOTE — Progress Notes (Signed)
? ?  Tina Parker - 51 y.o. female MRN [id number]  Date of birth: 05/14/1971 ? ?Office Visit Note: ?Visit Date: 09/23/2021 ?PCP: Einar Pheasant, MD ?Referred by: Leandrew Koyanagi, MD ? ?Subjective: ?Chief Complaint  ?Patient presents with  ? Right Hip - Pain  ? ?HPI:  Tina Parker is a 51 y.o. female who comes in today at the request of Dr. Eduard Roux for planned Right anesthetic hip arthrogram with fluoroscopic guidance.  The patient has failed conservative care including home exercise, medications, time and activity modification.  This injection will be diagnostic and hopefully therapeutic.  Please see requesting physician notes for further details and justification. ? ?ROS Otherwise per HPI. ? ?Assessment & Plan: ?Visit Diagnoses:  ?  ICD-10-CM   ?1. Pain in right hip  M25.551 XR C-ARM NO REPORT  ?  ?  ?Plan: No additional findings.  ? ?Meds & Orders: No orders of the defined types were placed in this encounter. ?  ?Orders Placed This Encounter  ?Procedures  ? Large Joint Inj  ? XR C-ARM NO REPORT  ?  ?Follow-up: Return if symptoms worsen or fail to improve.  ? ?Procedures: ?Large Joint Inj: R hip joint on 09/23/2021 2:15 PM ?Indications: diagnostic evaluation and pain ?Details: 22 G 3.5 in needle, fluoroscopy-guided anterior approach ? ?Arthrogram: No ? ?Medications: 4 mL bupivacaine 0.25 %; 60 mg triamcinolone acetonide 40 MG/ML ?Outcome: tolerated well, no immediate complications ? ?There was excellent flow of contrast producing a partial arthrogram of the hip. The patient did have relief of symptoms during the anesthetic phase of the injection. ?Procedure, treatment alternatives, risks and benefits explained, specific risks discussed. Consent was given by the patient. Immediately prior to procedure a time out was called to verify the correct patient, procedure, equipment, support staff and site/side marked as required. Patient was prepped and draped in the usual sterile fashion.  ? ?  ?   ? ?Clinical  History: ?No specialty comments available.  ? ? ? ?Objective:  VS:  HT:    WT:   BMI:     BP:   HR: bpm  TEMP: ( )  RESP:  ?Physical Exam  ? ?Imaging: ?No results found. ?

## 2021-09-30 ENCOUNTER — Ambulatory Visit: Payer: 59 | Admitting: Internal Medicine

## 2021-09-30 ENCOUNTER — Other Ambulatory Visit (HOSPITAL_COMMUNITY)
Admission: RE | Admit: 2021-09-30 | Discharge: 2021-09-30 | Disposition: A | Discharge status: Home / Self Care | Payer: 59 | Source: Ambulatory Visit | Attending: Internal Medicine | Admitting: Internal Medicine

## 2021-09-30 VITALS — BP 124/80 | HR 90 | Temp 97.9°F | Resp 16 | Ht 61.0 in | Wt 138.8 lb

## 2021-09-30 DIAGNOSIS — R739 Hyperglycemia, unspecified: Secondary | ICD-10-CM | POA: Diagnosis not present

## 2021-09-30 DIAGNOSIS — B009 Herpesviral infection, unspecified: Secondary | ICD-10-CM | POA: Diagnosis not present

## 2021-09-30 DIAGNOSIS — E7800 Pure hypercholesterolemia, unspecified: Secondary | ICD-10-CM

## 2021-09-30 DIAGNOSIS — R102 Pelvic and perineal pain: Secondary | ICD-10-CM | POA: Diagnosis not present

## 2021-09-30 DIAGNOSIS — M059 Rheumatoid arthritis with rheumatoid factor, unspecified: Secondary | ICD-10-CM

## 2021-09-30 DIAGNOSIS — R1024 Suprapubic pain: Secondary | ICD-10-CM | POA: Insufficient documentation

## 2021-09-30 DIAGNOSIS — N898 Other specified noninflammatory disorders of vagina: Secondary | ICD-10-CM | POA: Insufficient documentation

## 2021-09-30 DIAGNOSIS — I10 Essential (primary) hypertension: Secondary | ICD-10-CM

## 2021-09-30 LAB — BASIC METABOLIC PANEL
BUN: 13 mg/dL (ref 6–23)
CO2: 27 mEq/L (ref 19–32)
Calcium: 10.1 mg/dL (ref 8.4–10.5)
Chloride: 103 mEq/L (ref 96–112)
Creatinine, Ser: 0.93 mg/dL (ref 0.40–1.20)
GFR: 70.66 mL/min (ref >60.00)
Glucose, Bld: 85 mg/dL (ref 70–99)
Potassium: 4 mEq/L (ref 3.5–5.1)
Sodium: 137 mEq/L (ref 135–145)

## 2021-09-30 LAB — URINALYSIS, ROUTINE W REFLEX MICROSCOPIC
Bilirubin Urine: NEGATIVE
Hgb urine dipstick: NEGATIVE
Ketones, ur: NEGATIVE
Leukocytes,Ua: NEGATIVE
Nitrite: NEGATIVE
RBC / HPF: NONE SEEN (ref 0–?)
Specific Gravity, Urine: 1.01 (ref 1.000–1.030)
Total Protein, Urine: NEGATIVE
Urine Glucose: NEGATIVE
Urobilinogen, UA: 0.2 (ref 0.0–1.0)
pH: 7.5 (ref 5.0–8.0)

## 2021-09-30 LAB — LIPID PANEL
Cholesterol: 234 mg/dL — ABNORMAL HIGH (ref 0–200)
HDL: 63.5 mg/dL (ref >39.00)
LDL Cholesterol: 158 mg/dL — ABNORMAL HIGH (ref 0–99)
NonHDL: 170.98
Total CHOL/HDL Ratio: 4
Triglycerides: 65 mg/dL (ref 0.0–149.0)
VLDL: 13 mg/dL (ref 0.0–40.0)

## 2021-09-30 LAB — HEMOGLOBIN A1C: Hgb A1c MFr Bld: 5.6 % (ref 4.6–6.5)

## 2021-09-30 MED ORDER — VALACYCLOVIR HCL 500 MG PO TABS: ORAL_TABLET | ORAL | 2 refills | Status: DC

## 2021-09-30 NOTE — Progress Notes (Signed)
? ?Subjective:  ? ? Patient ID: Tina Parker, female    DOB: 07-25-1970, 51 y.o.   MRN: [id number] ? ?This visit occurred during the SARS-CoV-2 public health emergency.  Safety protocols were in place, including screening questions prior to the visit, additional usage of staff PPE, and extensive cleaning of exam room while observing appropriate contact time as indicated for disinfecting solutions.  ? ?Patient here for work in appt.  ? ?Chief Complaint  ?Patient presents with  ? Herpes Zoster  ? .  ? ?HPI ?Has a history of herpes.  Recent flare.  Took a treatment dose of valtrex for flare.  Did not completely resolve.  Persistent symptoms/lesion.  Is better.  Some burning when urine touches the area.  No intravaginal pain.  No abdominal pain.  No nausea or vomiting reported.  Breathing stable.  No increased cough or congestion.  ? ? ?Past Medical History:  ?Diagnosis Date  ? Allergic rhinitis   ? Anemia   ? Arthritis   ? RA  ? Carpal tunnel syndrome   ? Dysphagia   ? Esophageal spasm 01/16/2014  ? GERD (gastroesophageal reflux disease)   ? Graves disease   ? remission, no ablation, positive medical treatment  ? History of hiatal hernia   ? Hypertension   ? Migraine headache   ? migraines  ? PPD positive   ? hepatitis secondary to [place]  ? Pure hypercholesterolemia   ? Rheumatoid arthritis(714.0)   ? positive anti CCP antibodies, positive RF, oligo-articular, MTX  ? Sciatica   ? ?Past Surgical History:  ?Procedure Laterality Date  ? CARPAL TUNNEL RELEASE Right   ? [place]  ? COLONOSCOPY  03/14/2002, 08/16/2007, 03/07/2013  ? [facility]CC-father  ? COLONOSCOPY WITH PROPOFOL N/A 06/21/2018  ? Procedure: COLONOSCOPY WITH PROPOFOL;  Surgeon: Manya Silvas, MD;  Location: [facility] ENDOSCOPY;  Service: Endoscopy;  Laterality: N/A;  ? CRANIOTOMY Left 06/25/2017  ? Procedure: CRANIOTOMY TEMPORAL LEFT FOR TUMOR RESECTION;  Surgeon: Ashok Pall, MD;  Location: [place];  Service: Neurosurgery;  Laterality: Left;  ?  ESOPHAGOGASTRODUODENOSCOPY  03/14/2002, 03/07/2013  ? ESOPHAGOGASTRODUODENOSCOPY (EGD) WITH PROPOFOL N/A 06/21/2018  ? Procedure: ESOPHAGOGASTRODUODENOSCOPY (EGD) WITH PROPOFOL;  Surgeon: Manya Silvas, MD;  Location: [facility] ENDOSCOPY;  Service: Endoscopy;  Laterality: N/A;  ? ESOPHAGOGASTRODUODENOSCOPY (EGD) WITH PROPOFOL N/A 07/15/2019  ? Procedure: ESOPHAGOGASTRODUODENOSCOPY (EGD) WITH PROPOFOL;  Surgeon: Jonathon Bellows, MD;  Location: [facility] ENDOSCOPY;  Service: Gastroenterology;  Laterality: N/A;  ? Post Septoplasty and turbinate reduction  2007  ? TRIGGER FINGER RELEASE    ? ?Family History  ?Problem Relation Age of Onset  ? Cancer Mother   ?     Breast Cancer  ? Breast cancer Mother 56  ? Cancer Father   ?     Colon Cancer  ? Heart disease Father   ?     Hx of MI  ? Hypertension Father   ? Diabetes Father   ? Hyperlipidemia Father   ? Cancer Maternal Grandmother   ?     lung cancer  ? Cancer Maternal Uncle   ?     esophageal  ? ?Social History  ? ?Socioeconomic History  ? Marital status: Married  ?  Spouse name: Not on file  ? Number of children: 2  ? Years of education: Not on file  ? Highest education level: Not on file  ?Occupational History  ? Occupation: Hairstylist  ?Tobacco Use  ? Smoking status: Never  ? Smokeless tobacco:  Never  ?Vaping Use  ? Vaping Use: Never used  ?Substance and Sexual Activity  ? Alcohol use: Yes  ?  Alcohol/week: 0.0 standard drinks  ?  Comment: wine cooler 2 drinks 2x a week  ? Drug use: No  ? Sexual activity: Not on file  ?Other Topics Concern  ? 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 ?Social Determinants of Health  ? ?Financial Resource Strain: Low Risk   ? Difficulty of Paying Living Expenses: Not hard at all  ?Food Insecurity: No Food Insecurity  ? Worried About Charity fundraiser in the Last Year: Never true  ? Ran Out of Food in the Last Year: Never true  ?Transportation Needs: Not on file  ?Physical Activity: Insufficiently Active  ? Days of Exercise per  Week: 2 days  ? Minutes of Exercise per Session: 20 min  ?Stress: Not on file  ?Social Connections: Not on file  ? ? ? ?Review of Systems  ?Constitutional:  Negative for appetite change and fever.  ?HENT:  Negative for sore throat.   ?     No increased congestion.   ?Respiratory:  Negative for cough, chest tightness and shortness of breath.   ?Cardiovascular:  Negative for chest pain and palpitations.  ?Gastrointestinal:  Negative for abdominal pain, diarrhea, nausea and vomiting.  ?Genitourinary:  Positive for genital sores. Negative for difficulty urinating.  ?Musculoskeletal:  Negative for joint swelling and myalgias.  ?Skin:  Negative for color change and rash.  ?Neurological:  Negative for headaches.  ?Psychiatric/Behavioral:  Negative for agitation and dysphoric mood.   ? ?   ?Objective:  ?  ? ?BP 124/80   Pulse 90   Temp 97.9 ?F (36.6 ?C)   Resp 16   Ht '5\' 1"'$  (1.549 m)   Wt 138 lb 12.8 oz (63 kg)   SpO2 98%   BMI 26.23 kg/m?  ?Wt Readings from Last 3 Encounters:  ?09/30/21 138 lb 12.8 oz (63 kg)  ?09/03/21 142 lb (64.4 kg)  ?09/02/21 140 lb 12.8 oz (63.9 kg)  ? ? ?Physical Exam ?Vitals reviewed.  ?Constitutional:   ?   General: She is not in acute distress. ?   Appearance: Normal appearance.  ?HENT:  ?   Head: Normocephalic and atraumatic.  ?   Right Ear: External ear normal.  ?   Left Ear: External ear normal.  ?Eyes:  ?   General: No scleral icterus.    ?   Right eye: No discharge.     ?   Left eye: No discharge.  ?   Conjunctiva/sclera: Conjunctivae normal.  ?Neck:  ?   Thyroid: No thyromegaly.  ?Cardiovascular:  ?   Rate and Rhythm: Normal rate and regular rhythm.  ?Pulmonary:  ?   Effort: No respiratory distress.  ?   Breath sounds: Normal breath sounds. No wheezing.  ?Abdominal:  ?   General: Bowel sounds are normal.  ?   Palpations: Abdomen is soft.  ?   Tenderness: There is no abdominal tenderness.  ?Genitourinary: ?   Comments: Lesion - labia. Viral culture obtained. Vaginal vault without  lesions.  Could not appreciate any adnexal masses or tenderness.  KOH/wet prep obtained.  ?Musculoskeletal:     ?   General: No swelling or tenderness.  ?   Cervical back: Neck supple. No tenderness.  ?Lymphadenopathy:  ?   Cervical: No cervical adenopathy.  ?Skin: ?   Findings: No erythema or rash.  ?Neurological:  ?  Mental Status: She is alert.  ?Psychiatric:     ?   Mood and Affect: Mood normal.     ?   Behavior: Behavior normal.  ? ? ? ?Outpatient Encounter Medications as of 09/30/2021  ?Medication Sig  ? amLODipine (NORVASC) 2.5 MG tablet Take 1 tablet (2.5 mg total) by mouth daily.  ? Azelastine-Fluticasone 137-50 MCG/ACT SUSP Place 1 spray into both nostrils 2 (two) times daily.  ? budesonide-formoterol (SYMBICORT) 80-4.5 MCG/ACT inhaler 1-2 puffs twice daily (rinse mouth after use)  ? cholecalciferol (VITAMIN D3) 25 MCG (1000 UNIT) tablet Take 1,000 Units by mouth daily.  ? citalopram (CELEXA) 40 MG tablet TAKE 1 TABLET(40 MG) BY MOUTH DAILY  ? desloratadine (CLARINEX) 5 MG tablet Take 5 mg by mouth daily.  ? folic acid (FOLVITE) 1 MG tablet Take 1 mg by mouth daily.  ? losartan (COZAAR) 100 MG tablet TAKE 1 TABLET(100 MG) BY MOUTH DAILY  ? methotrexate (RHEUMATREX) 2.5 MG tablet Take 22.5 mg by mouth every Monday. Caution:Chemotherapy. Protect from light. Take 9 tabs po weekly with meals  ? omeprazole (PRILOSEC) 40 MG capsule Take 1 capsule (40 mg total) by mouth 2 (two) times daily.  ? RINVOQ 15 MG TB24 Take 1 tablet by mouth daily.  ? Semaglutide-Weight Management (WEGOVY) 1.7 MG/0.75ML SOAJ Inject 1.7 mg into the skin once a week.  ? vitamin B-12 (CYANOCOBALAMIN) 100 MCG tablet Take 100 mcg by mouth daily.  ? [DISCONTINUED] valACYclovir (VALTREX) 500 MG tablet Take 1 tablet bid x 3 days.  May repeat prn with flares.  ? valACYclovir (VALTREX) 500 MG tablet Take 1 tablet bid x 3 days, then continue one po q day  ? ?Facility-Administered Encounter Medications as of 09/30/2021  ?Medication  ? betamethasone  acetate-betamethasone sodium phosphate (CELESTONE) injection 12 mg  ?  ? ?Lab Results  ?Component Value Date  ? WBC 5.4 06/25/2021  ? HGB 11.1 (L) 06/25/2021  ? HCT 31.0 (L) 06/25/2021  ? PLT 387.0 06/25/2021  ? GLUCOSE 85 04/

## 2021-10-01 LAB — URINE CULTURE
MICRO NUMBER:: 13214350
SPECIMEN QUALITY:: ADEQUATE

## 2021-10-01 LAB — CERVICOVAGINAL ANCILLARY ONLY
Bacterial Vaginitis (gardnerella): NEGATIVE
Candida Glabrata: NEGATIVE
Candida Vaginitis: NEGATIVE
Comment: NEGATIVE
Comment: NEGATIVE
Comment: NEGATIVE

## 2021-10-01 LAB — TIQ-NTM

## 2021-10-03 LAB — HERPES SIMPLEX VIRUS CULTURE: MICRO NUMBER:: 13231177

## 2021-10-04 ENCOUNTER — Encounter: Payer: Self-pay | Admitting: Internal Medicine

## 2021-10-05 ENCOUNTER — Encounter: Payer: Self-pay | Admitting: Internal Medicine

## 2021-10-05 NOTE — Assessment & Plan Note (Signed)
On amlodipine and losartan.  Pressure as outlined.  Follow pressures.  Follow metabolic panel.  ?

## 2021-10-05 NOTE — Assessment & Plan Note (Signed)
Followed by rheumatology. On MTX.  Also receiving rinvoq.  Stable.  

## 2021-10-05 NOTE — Assessment & Plan Note (Signed)
Recent flare.  Treated with valtrex recently.  Persistent discomfort.  Persistent lesion. Valtrex - treatment dose and then will continue valtrex '500mg'$  daily for prophylaxis.  Call with update.  ?

## 2021-10-09 ENCOUNTER — Telehealth: Payer: Self-pay | Admitting: Physical Medicine and Rehabilitation

## 2021-10-09 NOTE — Telephone Encounter (Signed)
Patient called advised she need an additional injection in her right hip. The number to contact patient is [phone] ?

## 2021-10-14 ENCOUNTER — Ambulatory Visit: Payer: 59 | Admitting: Internal Medicine

## 2021-12-04 ENCOUNTER — Other Ambulatory Visit: Payer: Self-pay | Admitting: Internal Medicine

## 2021-12-16 ENCOUNTER — Other Ambulatory Visit: Payer: Self-pay | Admitting: Internal Medicine

## 2021-12-19 ENCOUNTER — Encounter: Payer: Self-pay | Admitting: Internal Medicine

## 2021-12-19 DIAGNOSIS — E2839 Other primary ovarian failure: Secondary | ICD-10-CM

## 2021-12-19 DIAGNOSIS — E7800 Pure hypercholesterolemia, unspecified: Secondary | ICD-10-CM

## 2021-12-19 DIAGNOSIS — D649 Anemia, unspecified: Secondary | ICD-10-CM

## 2021-12-19 DIAGNOSIS — E039 Hypothyroidism, unspecified: Secondary | ICD-10-CM

## 2021-12-19 DIAGNOSIS — E559 Vitamin D deficiency, unspecified: Secondary | ICD-10-CM

## 2021-12-19 DIAGNOSIS — M059 Rheumatoid arthritis with rheumatoid factor, unspecified: Secondary | ICD-10-CM

## 2021-12-19 DIAGNOSIS — R739 Hyperglycemia, unspecified: Secondary | ICD-10-CM

## 2021-12-20 DIAGNOSIS — R739 Hyperglycemia, unspecified: Secondary | ICD-10-CM | POA: Insufficient documentation

## 2021-12-23 ENCOUNTER — Ambulatory Visit: Payer: 59 | Admitting: Podiatry

## 2021-12-23 DIAGNOSIS — M2011 Hallux valgus (acquired), right foot: Secondary | ICD-10-CM | POA: Diagnosis not present

## 2021-12-23 DIAGNOSIS — M21611 Bunion of right foot: Secondary | ICD-10-CM

## 2021-12-23 DIAGNOSIS — M069 Rheumatoid arthritis, unspecified: Secondary | ICD-10-CM

## 2021-12-26 NOTE — Progress Notes (Signed)
  Subjective:  Patient ID: Tina Parker, female    DOB: September 25, 1970,  MRN: [id number]  Chief Complaint  Patient presents with   Tendonitis      right bunion pain/ req injection    51 y.o. female presents with the above complaint. History confirmed with patient.  Last injection was very helpful.  She requested a second 1 today.  Only needs it in the right foot the left foot is doing well  Objective:  Physical Exam: warm, good capillary refill, no trophic changes or ulcerative lesions, normal DP and PT pulses, and normal sensory exam. Left Foot: She has pain over the lateral ankle along the peroneal tendons proximal malleolus, she has a bunion that is painful swollen joint pain with range of motion Right Foot: Bunion painful swollen joint with range of motion mostly dorsal  No images are attached to the encounter.  Radiographs: Multiple views x-ray of both feet: Bilateral hallux valgus deformity with mild lateral joint space narrowing, slight dorsal spurring Assessment:   1. Hallux valgus with bunions, right   2. Rheumatoid arthritis involving both feet, unspecified whether rheumatoid factor present [facility])      Plan:  Patient was evaluated and treated and all questions answered.    Previous injection has lasted nearly 9 months.  She is not ready to pursue surgical intervention.  She would like another injection.  Repeat corticosteroid injection was performed today for symptomatic control.  Following sterile prep with Betadine I injected 0.5 cc of lidocaine 2%, 10 mg of Kenalog and 2 mg of dexamethasone phosphate.  She tolerated procedure well.  She will follow-up as needed for further injections Return if symptoms worsen or fail to improve.

## 2022-01-07 ENCOUNTER — Other Ambulatory Visit: Payer: Self-pay | Admitting: Internal Medicine

## 2022-01-27 ENCOUNTER — Other Ambulatory Visit (INDEPENDENT_AMBULATORY_CARE_PROVIDER_SITE_OTHER): Payer: 59

## 2022-01-27 DIAGNOSIS — R739 Hyperglycemia, unspecified: Secondary | ICD-10-CM | POA: Diagnosis not present

## 2022-01-27 DIAGNOSIS — E039 Hypothyroidism, unspecified: Secondary | ICD-10-CM

## 2022-01-27 DIAGNOSIS — M059 Rheumatoid arthritis with rheumatoid factor, unspecified: Secondary | ICD-10-CM

## 2022-01-27 DIAGNOSIS — E7800 Pure hypercholesterolemia, unspecified: Secondary | ICD-10-CM | POA: Diagnosis not present

## 2022-01-27 DIAGNOSIS — D649 Anemia, unspecified: Secondary | ICD-10-CM

## 2022-01-27 DIAGNOSIS — E559 Vitamin D deficiency, unspecified: Secondary | ICD-10-CM

## 2022-01-27 LAB — CBC WITH DIFFERENTIAL/PLATELET
Basophils Absolute: 0 10*3/uL (ref 0.0–0.1)
Basophils Relative: 0.3 % (ref 0.0–3.0)
Eosinophils Absolute: 0 10*3/uL (ref 0.0–0.7)
Eosinophils Relative: 0.5 % (ref 0.0–5.0)
HCT: 33.3 % — ABNORMAL LOW (ref 36.0–46.0)
Hemoglobin: 11.3 g/dL — ABNORMAL LOW (ref 12.0–15.0)
Lymphocytes Relative: 35.6 % (ref 12.0–46.0)
Lymphs Abs: 1.9 10*3/uL (ref 0.7–4.0)
MCHC: 34 g/dL (ref 30.0–36.0)
MCV: 96.5 fl (ref 78.0–100.0)
Monocytes Absolute: 0.5 10*3/uL (ref 0.1–1.0)
Monocytes Relative: 8.8 % (ref 3.0–12.0)
Neutro Abs: 2.9 10*3/uL (ref 1.4–7.7)
Neutrophils Relative %: 54.8 % (ref 43.0–77.0)
Platelets: 352 10*3/uL (ref 150.0–400.0)
RBC: 3.46 Mil/uL — ABNORMAL LOW (ref 3.87–5.11)
RDW: 14.2 % (ref 11.5–15.5)
WBC: 5.3 10*3/uL (ref 4.0–10.5)

## 2022-01-27 LAB — LIPID PANEL
Cholesterol: 247 mg/dL — ABNORMAL HIGH (ref 0–200)
HDL: 63.1 mg/dL (ref >39.00)
LDL Cholesterol: 171 mg/dL — ABNORMAL HIGH (ref 0–99)
NonHDL: 184.06
Total CHOL/HDL Ratio: 4
Triglycerides: 67 mg/dL (ref 0.0–149.0)
VLDL: 13.4 mg/dL (ref 0.0–40.0)

## 2022-01-27 LAB — BASIC METABOLIC PANEL
BUN: 14 mg/dL (ref 6–23)
CO2: 27 mEq/L (ref 19–32)
Calcium: 9.5 mg/dL (ref 8.4–10.5)
Chloride: 103 mEq/L (ref 96–112)
Creatinine, Ser: 0.92 mg/dL (ref 0.40–1.20)
GFR: 71.42 mL/min (ref >60.00)
Glucose, Bld: 93 mg/dL (ref 70–99)
Potassium: 4.5 mEq/L (ref 3.5–5.1)
Sodium: 136 mEq/L (ref 135–145)

## 2022-01-27 LAB — TSH: TSH: 1.73 u[IU]/mL (ref 0.35–5.50)

## 2022-01-27 LAB — HEPATIC FUNCTION PANEL
ALT: 19 U/L (ref 0–35)
AST: 25 U/L (ref 0–37)
Albumin: 4.1 g/dL (ref 3.5–5.2)
Alkaline Phosphatase: 61 U/L (ref 39–117)
Bilirubin, Direct: 0.1 mg/dL (ref 0.0–0.3)
Total Bilirubin: 0.7 mg/dL (ref 0.2–1.2)
Total Protein: 7.6 g/dL (ref 6.0–8.3)

## 2022-01-27 LAB — VITAMIN D 25 HYDROXY (VIT D DEFICIENCY, FRACTURES): VITD: 31.47 ng/mL (ref 30.00–100.00)

## 2022-01-27 LAB — HEMOGLOBIN A1C: Hgb A1c MFr Bld: 5.6 % (ref 4.6–6.5)

## 2022-02-03 ENCOUNTER — Ambulatory Visit: Payer: 59 | Admitting: Podiatry

## 2022-02-03 DIAGNOSIS — M2011 Hallux valgus (acquired), right foot: Secondary | ICD-10-CM

## 2022-02-03 DIAGNOSIS — B351 Tinea unguium: Secondary | ICD-10-CM

## 2022-02-03 DIAGNOSIS — L600 Ingrowing nail: Secondary | ICD-10-CM | POA: Diagnosis not present

## 2022-02-03 MED ORDER — NEOMYCIN-POLYMYXIN-HC 3.5-10000-1 OT SUSP: OTIC | 0 refills | Status: DC

## 2022-02-03 NOTE — Progress Notes (Signed)
  Subjective:  Patient ID: Tina Parker, female    DOB: [birth year],  MRN: [id number]  Chief Complaint  Patient presents with   Nail Problem    Bilateral hallux toenails not growing and discolored    Bunions    Right foot bunion pain reported by patient    51 y.o. female presents with the above complaint. History confirmed with patient.  Last injection did not help much at all.  The great toe nails are painful as well.  The left hallux nail has not grown much since it was previously removed.  She had ingrown nail removal on the right side many years ago, and is hurting now again in the same area.  The bunion is still quite painful  Objective:  Physical Exam: warm, good capillary refill, no trophic changes or ulcerative lesions, normal DP and PT pulses, and normal sensory exam. Left Foot: She has dystrophic nail with likely onychomycosis, minimal growth proximally, she has a bunion that is painful swollen joint pain with range of motion Right Foot: Bunion painful swollen joint with range of motion mostly dorsal, ingrown nail medial border of the hallux  No images are attached to the encounter.  Radiographs: Multiple views x-ray of both feet: Bilateral hallux valgus deformity with mild lateral joint space narrowing, slight dorsal spurring Assessment:   1. Hallux valgus with bunions, right   2. Ingrowing right great toenail   3. Onychomycosis      Plan:  Patient was evaluated and treated and all questions answered.  Unfortunate the last injection did not quite help with the bunion.  I suspect this is becoming worse and likely is going to need surgical intervention at this point.  She will let me know when she is ready to pursue this.  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    Ingrown Nail,  -She had regrowth of proximal medial edge of her right hallux nail was previously moved.  I recommended repeat partial avulsion or matricectomy.  The left hallux and on the other hand is completely dystrophic.  This is not as bothersome right now.  She will find out if she will be able to attach the full nail to this area if it is removed completely.  I expect at some point she will need total permanent avulsion of this as well  Procedure: Excision of Ingrown Toenail Location: Right 1st toe medial nail borders. Anesthesia: Lidocaine 1% plain; 1.5 mL and Marcaine 0.5% plain; 1.5 mL, digital block. Skin Prep: Betadine. Dressing: Silvadene; telfa; dry, sterile, compression dressing. Technique: Following skin prep, the toe was exsanguinated and a tourniquet was secured at the base of the toe. The affected nail border was freed, split with a nail splitter, and excised. Chemical matrixectomy was then performed with phenol and irrigated out with alcohol. The tourniquet was then removed and sterile dressing applied. Disposition: Patient tolerated procedure well.     Return if symptoms worsen or fail to improve.

## 2022-02-03 NOTE — Patient Instructions (Addendum)

## 2022-02-04 ENCOUNTER — Ambulatory Visit: Payer: 59 | Admitting: Internal Medicine

## 2022-02-04 DIAGNOSIS — Z114 Encounter for screening for human immunodeficiency virus [HIV]: Secondary | ICD-10-CM

## 2022-02-04 DIAGNOSIS — E7800 Pure hypercholesterolemia, unspecified: Secondary | ICD-10-CM

## 2022-02-04 DIAGNOSIS — I10 Essential (primary) hypertension: Secondary | ICD-10-CM

## 2022-02-04 DIAGNOSIS — F419 Anxiety disorder, unspecified: Secondary | ICD-10-CM

## 2022-02-04 DIAGNOSIS — Z1159 Encounter for screening for other viral diseases: Secondary | ICD-10-CM

## 2022-02-04 DIAGNOSIS — D649 Anemia, unspecified: Secondary | ICD-10-CM

## 2022-02-04 DIAGNOSIS — E042 Nontoxic multinodular goiter: Secondary | ICD-10-CM

## 2022-02-04 DIAGNOSIS — D329 Benign neoplasm of meninges, unspecified: Secondary | ICD-10-CM

## 2022-02-04 DIAGNOSIS — R739 Hyperglycemia, unspecified: Secondary | ICD-10-CM

## 2022-02-04 DIAGNOSIS — E039 Hypothyroidism, unspecified: Secondary | ICD-10-CM

## 2022-02-04 DIAGNOSIS — M059 Rheumatoid arthritis with rheumatoid factor, unspecified: Secondary | ICD-10-CM

## 2022-02-04 DIAGNOSIS — Z136 Encounter for screening for cardiovascular disorders: Secondary | ICD-10-CM

## 2022-02-04 DIAGNOSIS — G4730 Sleep apnea, unspecified: Secondary | ICD-10-CM

## 2022-02-04 DIAGNOSIS — Z800 Family history of malignant neoplasm of digestive organs: Secondary | ICD-10-CM

## 2022-02-04 DIAGNOSIS — K219 Gastro-esophageal reflux disease without esophagitis: Secondary | ICD-10-CM

## 2022-02-04 DIAGNOSIS — Z9109 Other allergy status, other than to drugs and biological substances: Secondary | ICD-10-CM

## 2022-02-04 MED ORDER — AMLODIPINE BESYLATE 5 MG PO TABS: 5.0000 mg | ORAL_TABLET | Freq: Every day | ORAL | 1 refills | Status: DC

## 2022-02-04 NOTE — Progress Notes (Signed)
Patient ID: [name], female   DOB: October 23, 1970, 51 y.o.   MRN: [id number]   Subjective:    Patient ID: [name], female    DOB: May 07, 1971, 51 y.o.   MRN: [id number]   Patient here for a scheduled follow up.   Chief Complaint  Patient presents with   Hyperlipidemia   Hypertension   .   HPI States she is doing relatively well.  Exercising.  Lost more weight.  Tolerating wegovy.  No increased acid reflux.  No dysphagia.  No chest pain or sob.  Some allergy issues - increased with weather change, etc.  Overall feels is controlled on her current regimen.  No abdominal pain.  Bowels stable.  Increased stress.  Does not feel needs any further intervention.  Has good support.  Blood pressure remaining elevated - 140-145/90.  Discussed calcium score - testing.     Past Medical History:  Diagnosis Date   Allergic rhinitis    Anemia    Arthritis    RA   Carpal tunnel syndrome    Dysphagia    Esophageal spasm 01/16/2014   GERD (gastroesophageal reflux disease)    Graves disease    remission, no ablation, positive medical treatment   History of hiatal hernia    Hypertension    Migraine headache    migraines   PPD positive    hepatitis secondary to [place]   Pure hypercholesterolemia    Rheumatoid arthritis(714.0)    positive anti CCP antibodies, positive RF, oligo-articular, MTX   Sciatica    Past Surgical History:  Procedure Laterality Date   CARPAL TUNNEL RELEASE Right    [place]   COLONOSCOPY  03/14/2002, 08/16/2007, 03/07/2013   [facility]-father   COLONOSCOPY WITH PROPOFOL N/A 06/21/2018   Procedure: COLONOSCOPY WITH PROPOFOL;  Surgeon: Manya Silvas, MD;  Location: [facility] ENDOSCOPY;  Service: Endoscopy;  Laterality: N/A;   CRANIOTOMY Left 06/25/2017   Procedure: CRANIOTOMY TEMPORAL LEFT FOR TUMOR RESECTION;  Surgeon: Ashok Pall, MD;  Location: [facility];  Service: Neurosurgery;  Laterality: Left;   ESOPHAGOGASTRODUODENOSCOPY  03/14/2002, 03/07/2013    ESOPHAGOGASTRODUODENOSCOPY (EGD) WITH PROPOFOL N/A 06/21/2018   Procedure: ESOPHAGOGASTRODUODENOSCOPY (EGD) WITH PROPOFOL;  Surgeon: Manya Silvas, MD;  Location[facility] ENDOSCOPY;  Service: Endoscopy;  Laterality: N/A;   ESOPHAGOGASTRODUODENOSCOPY (EGD) WITH PROPOFOL N/A 07/15/2019   Procedure: ESOPHAGOGASTRODUODENOSCOPY (EGD) WITH PROPOFOL;  Surgeon: Jonathon Bellows, MD;  Location: [facility] ENDOSCOPY;  Service: Gastroenterology;  Laterality: N/A;   Post Septoplasty and turbinate reduction  2007   TRIGGER FINGER RELEASE     Family History  Problem Relation Age of Onset   Cancer Mother        Breast Cancer   Breast cancer Mother 86   Cancer Father        Colon Cancer   Heart disease Father        Hx of MI   Hypertension Father    Diabetes Father    Hyperlipidemia Father    Cancer Maternal Grandmother        lung cancer   Cancer Maternal Uncle        esophageal   Social History   Socioeconomic History   Marital status: Married    Spouse name: Not on file   Number of children: 2   Years of education: Not on file   Highest education level: Not on file  Occupational History   Occupation: Hairstylist  Tobacco Use   Smoking status: Never   Smokeless  tobacco: Never  Vaping Use   Vaping Use: Never used  Substance and Sexual Activity   Alcohol use: Yes    Alcohol/week: 0.0 standard drinks of alcohol    Comment: wine cooler 2 drinks 2x a week   Drug use: No   Sexual activity: Not on file  Other Topics Concern   Not on file  Social History Narrative   Hairdresser    Social Determinants of Health   Financial Resource Strain: Low Risk  (03/13/2021)   Overall Financial Resource Strain (CARDIA)    Difficulty of Paying Living Expenses: Not hard at all  Food Insecurity: No Food Insecurity (11/01/2020)   Hunger Vital Sign    Worried About Running Out of Food in the Last Year: Never true    Ran Out of Food in the Last Year: Never true  Transportation Needs: Not on file  Physical  Activity: Insufficiently Active (11/01/2020)   Exercise Vital Sign    Days of Exercise per Week: 2 days    Minutes of Exercise per Session: 20 min  Stress: Not on file  Social Connections: Not on file     Review of Systems  Constitutional:  Negative for appetite change and unexpected weight change.  HENT:  Negative for congestion and sinus pressure.   Respiratory:  Negative for cough, chest tightness and shortness of breath.   Cardiovascular:  Negative for chest pain, palpitations and leg swelling.  Gastrointestinal:  Negative for abdominal pain, diarrhea, nausea and vomiting.  Genitourinary:  Negative for difficulty urinating and dysuria.  Musculoskeletal:  Negative for joint swelling and myalgias.  Skin:  Negative for color change and rash.  Neurological:  Negative for dizziness, light-headedness and headaches.  Psychiatric/Behavioral:  Negative for agitation and dysphoric mood.        Objective:     BP 134/88   Pulse 84   Temp 98.1 F (36.7 C) (Temporal)   Resp 17   Ht 5' 1" (1.549 m)   Wt 135 lb 3.2 oz (61.3 kg)   SpO2 99%   BMI 25.55 kg/m  Wt Readings from Last 3 Encounters:  02/04/22 135 lb 3.2 oz (61.3 kg)  09/30/21 138 lb 12.8 oz (63 kg)  09/03/21 142 lb (64.4 kg)    Physical Exam Vitals reviewed.  Constitutional:      General: She is not in acute distress.    Appearance: Normal appearance.  HENT:     Head: Normocephalic and atraumatic.     Right Ear: External ear normal.     Left Ear: External ear normal.  Eyes:     General: No scleral icterus.       Right eye: No discharge.        Left eye: No discharge.     Conjunctiva/sclera: Conjunctivae normal.  Neck:     Thyroid: No thyromegaly.  Cardiovascular:     Rate and Rhythm: Normal rate and regular rhythm.  Pulmonary:     Effort: No respiratory distress.     Breath sounds: Normal breath sounds. No wheezing.  Abdominal:     General: Bowel sounds are normal.     Palpations: Abdomen is soft.      Tenderness: There is no abdominal tenderness.  Musculoskeletal:        General: No swelling or tenderness.     Cervical back: Neck supple. No tenderness.  Lymphadenopathy:     Cervical: No cervical adenopathy.  Skin:    Findings: No erythema or rash.  Neurological:  Mental Status: She is alert.  Psychiatric:        Mood and Affect: Mood normal.        Behavior: Behavior normal.      Outpatient Encounter Medications as of 02/04/2022  Medication Sig   amLODipine (NORVASC) 5 MG tablet Take 1 tablet (5 mg total) by mouth daily.   Azelastine-Fluticasone 137-50 MCG/ACT SUSP Place 1 spray into both nostrils 2 (two) times daily.   budesonide-formoterol (SYMBICORT) 80-4.5 MCG/ACT inhaler 1-2 puffs twice daily (rinse mouth after use)   cholecalciferol (VITAMIN D3) 25 MCG (1000 UNIT) tablet Take 1,000 Units by mouth daily.   citalopram (CELEXA) 40 MG tablet TAKE 1 TABLET(40 MG) BY MOUTH DAILY   desloratadine (CLARINEX) 5 MG tablet Take 5 mg by mouth daily.   folic acid (FOLVITE) 1 MG tablet Take 1 mg by mouth daily.   losartan (COZAAR) 100 MG tablet TAKE 1 TABLET(100 MG) BY MOUTH DAILY   methotrexate (RHEUMATREX) 2.5 MG tablet Take 22.5 mg by mouth every Monday. Caution:Chemotherapy. Protect from light. Take 9 tabs po weekly with meals   neomycin-polymyxin-hydrocortisone (CORTISPORIN) 3.5-10000-1 OTIC suspension Apply 1-2 drops daily after soaking and cover with bandaid   omeprazole (PRILOSEC) 40 MG capsule Take 1 capsule (40 mg total) by mouth 2 (two) times daily.   RINVOQ 15 MG TB24 Take 1 tablet by mouth daily.   valACYclovir (VALTREX) 500 MG tablet Take 1 tablet bid x 3 days, then continue one po q day   vitamin B-12 (CYANOCOBALAMIN) 100 MCG tablet Take 100 mcg by mouth daily.   WEGOVY 1.7 MG/0.75ML SOAJ INJECT 1.7 MG IN THE MUSCLE ONCE A WEEK   [DISCONTINUED] amLODipine (NORVASC) 2.5 MG tablet TAKE 1 TABLET(2.5 MG) BY MOUTH DAILY   Facility-Administered Encounter Medications as of  02/04/2022  Medication   betamethasone acetate-betamethasone sodium phosphate (CELESTONE) injection 12 mg     Lab Results  Component Value Date   WBC 5.3 01/27/2022   HGB 11.3 (L) 01/27/2022   HCT 33.3 (L) 01/27/2022   PLT 352.0 01/27/2022   GLUCOSE 93 01/27/2022   CHOL 247 (H) 01/27/2022   TRIG 67.0 01/27/2022   HDL 63.10 01/27/2022   LDLCALC 171 (H) 01/27/2022   ALT 19 01/27/2022   AST 25 01/27/2022   NA 136 01/27/2022   K 4.5 01/27/2022   CL 103 01/27/2022   CREATININE 0.92 01/27/2022   BUN 14 01/27/2022   CO2 27 01/27/2022   TSH 1.73 01/27/2022   HGBA1C 5.6 01/27/2022       Assessment & Plan:   Problem List Items Addressed This Visit     Anemia    History of IDA.  Has seen Dr Vicente Males previously.  Had EGD and colonoscopy.  Question of need for capsule study or any further GI testing for history of IDA.  Had f/u appt with Dr Vicente Males - 06/2021.  Felt no further w/up warranted.        Relevant Orders   CBC w/Diff   Anxiety    Continue citalopram.  Follow.       Environmental allergies    Continue current allergy regimen.  Has rescue inhaler if needed.  Follow.       Essential hypertension, benign    On amlodipine and losartan.  Pressure as outlined.  Amlodipine - to 46m q day. Follow pressures.  Follow metabolic panel.       Relevant Medications   amLODipine (NORVASC) 5 MG tablet   Other Relevant Orders   Basic  Metabolic Panel (BMET)   Family history of colon cancer    Colonoscopy 05/2018 - internal hemorrhoids.       GERD (gastroesophageal reflux disease)    Appears to be stable - on prilosec.       Hypercholesterolemia    The 10-year ASCVD risk score (Arnett DK, et al., 2019) is: 5.8%   Values used to calculate the score:     Age: 75 years     Sex: Female     Is Non-Hispanic African American: Yes     Diabetic: No     Tobacco smoker: No     Systolic Blood Pressure: 767 mmHg     Is BP treated: Yes     HDL Cholesterol: 63.1 mg/dL     Total Cholesterol:  247 mg/dL  Low cholesterol diet and exercise.  Check calcium score.  Follow lipid panel.       Relevant Medications   amLODipine (NORVASC) 5 MG tablet   Other Relevant Orders   Lipid Profile   Hepatic function panel   Hyperglycemia    Low carb diet and exercise.  Follow met b and a1c.       Relevant Orders   HgB A1c   Hypothyroidism    On thyroid replacement.  Follow tsh.       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        Mild sleep apnea    CPAP.       Multiple thyroid nodules    Seeing endocrinology.  Follow up ultrasound:  Left mid thyroid nodule meets criteria for continued surveillance, as designated by the newly established ACR TI-RADS criteria. Surveillance ultrasound study recommended to be performed annually up to 5 years.  TFTs wnl.  Recommended continued follow up.        Rheumatoid arthritis ([place])    Followed by rheumatology. On MTX.  Also receiving rinvoq.  Stable.       Screening for heart disease    Discussed.  Check calcium score.       Relevant Orders   CT CARDIAC SCORING   Other Visit Diagnoses     Screening for HIV (human immunodeficiency virus)       Relevant Orders   HIV antibody (with reflex)   Encounter for hepatitis C screening test for low risk patient       Relevant Orders   Hepatitis C Antibody        Einar Pheasant, MD

## 2022-02-04 NOTE — Assessment & Plan Note (Addendum)
The 10-year ASCVD risk score (Arnett DK, et al., 2019) is: 5.8%   Values used to calculate the score:     Age: 51 years     Sex: Female     Is Non-Hispanic African American: Yes     Diabetic: No     Tobacco smoker: No     Systolic Blood Pressure: 922 mmHg     Is BP treated: Yes     HDL Cholesterol: 63.1 mg/dL     Total Cholesterol: 247 mg/dL  Low cholesterol diet and exercise.  Check calcium score.  Follow lipid panel.

## 2022-02-07 ENCOUNTER — Other Ambulatory Visit: Payer: Self-pay | Admitting: Neurosurgery

## 2022-02-07 DIAGNOSIS — D329 Benign neoplasm of meninges, unspecified: Secondary | ICD-10-CM

## 2022-02-10 ENCOUNTER — Ambulatory Visit
Admission: RE | Admit: 2022-02-10 | Discharge: 2022-02-10 | Disposition: A | Discharge status: Home / Self Care | Payer: 59 | Source: Ambulatory Visit | Attending: Internal Medicine | Admitting: Internal Medicine

## 2022-02-10 DIAGNOSIS — E2839 Other primary ovarian failure: Secondary | ICD-10-CM | POA: Insufficient documentation

## 2022-02-15 ENCOUNTER — Encounter: Payer: Self-pay | Admitting: Internal Medicine

## 2022-02-15 DIAGNOSIS — Z136 Encounter for screening for cardiovascular disorders: Secondary | ICD-10-CM | POA: Insufficient documentation

## 2022-02-15 NOTE — Assessment & Plan Note (Signed)
On thyroid replacement.  Follow tsh.  

## 2022-02-15 NOTE — Assessment & Plan Note (Signed)
Appears to be stable - on prilosec.  

## 2022-02-15 NOTE — Assessment & Plan Note (Signed)
On amlodipine and losartan.  Pressure as outlined.  Amlodipine - to '5mg'$  q day. Follow pressures.  Follow metabolic panel.

## 2022-02-15 NOTE — Assessment & Plan Note (Signed)
History of IDA.  Has seen Dr Anna previously.  Had EGD and colonoscopy.  Question of need for capsule study or any further GI testing for history of IDA.  Had f/u appt with Dr Anna - 06/2021.  Felt no further w/up warranted.   

## 2022-02-15 NOTE — Assessment & Plan Note (Signed)
Continue current allergy regimen.  Has rescue inhaler if needed.  Follow.

## 2022-02-15 NOTE — Assessment & Plan Note (Signed)
Followed by Dr Cabbell - [facility].   

## 2022-02-15 NOTE — Assessment & Plan Note (Signed)
Colonoscopy 05/2018 - internal hemorrhoids.

## 2022-02-15 NOTE — Assessment & Plan Note (Signed)
Low carb diet and exercise.  Follow met b and a1c.  

## 2022-02-15 NOTE — Assessment & Plan Note (Signed)
Continue citalopram.   Follow.  

## 2022-02-15 NOTE — Assessment & Plan Note (Signed)
CPAP.  

## 2022-02-15 NOTE — Assessment & Plan Note (Signed)
Seeing endocrinology.  Follow up ultrasound:  Left mid thyroid nodule meets criteria for continued surveillance, as designated by the newly established ACR TI-RADS criteria. Surveillance ultrasound study recommended to be performed annually up to 5 years.  TFTs wnl.  Recommended continued follow up.   

## 2022-02-15 NOTE — Assessment & Plan Note (Signed)
Followed by rheumatology. On MTX.  Also receiving rinvoq.  Stable.

## 2022-02-15 NOTE — Assessment & Plan Note (Signed)
Discussed.  Check calcium score.

## 2022-02-24 ENCOUNTER — Encounter: Payer: Self-pay | Admitting: Internal Medicine

## 2022-02-24 ENCOUNTER — Ambulatory Visit
Admission: RE | Admit: 2022-02-24 | Discharge: 2022-02-24 | Disposition: A | Discharge status: Home / Self Care | Payer: 59 | Source: Ambulatory Visit | Attending: Internal Medicine | Admitting: Internal Medicine

## 2022-02-24 ENCOUNTER — Ambulatory Visit
Admission: RE | Admit: 2022-02-24 | Discharge: 2022-02-24 | Disposition: A | Discharge status: Home / Self Care | Payer: 59 | Source: Ambulatory Visit | Attending: Neurosurgery | Admitting: Neurosurgery

## 2022-02-24 DIAGNOSIS — D329 Benign neoplasm of meninges, unspecified: Secondary | ICD-10-CM

## 2022-02-24 DIAGNOSIS — Z136 Encounter for screening for cardiovascular disorders: Secondary | ICD-10-CM | POA: Insufficient documentation

## 2022-02-24 MED ORDER — GADOBENATE DIMEGLUMINE 529 MG/ML IV SOLN
12.0000 mL | Freq: Once | INTRAVENOUS | Status: AC | PRN
  Administered 2022-02-24: 12 mL via INTRAVENOUS

## 2022-02-25 ENCOUNTER — Other Ambulatory Visit: Payer: 59

## 2022-03-05 ENCOUNTER — Ambulatory Visit: Payer: 59 | Admitting: Podiatry

## 2022-03-07 ENCOUNTER — Ambulatory Visit
Admission: RE | Admit: 2022-03-07 | Discharge: 2022-03-07 | Disposition: A | Discharge status: Home / Self Care | Payer: 59 | Source: Ambulatory Visit | Attending: Neurosurgery | Admitting: Neurosurgery

## 2022-03-07 ENCOUNTER — Other Ambulatory Visit: Payer: Self-pay | Admitting: Neurosurgery

## 2022-03-07 DIAGNOSIS — D329 Benign neoplasm of meninges, unspecified: Secondary | ICD-10-CM

## 2022-03-07 MED ORDER — GADOBENATE DIMEGLUMINE 529 MG/ML IV SOLN
15.0000 mL | Freq: Once | INTRAVENOUS | Status: AC | PRN
  Administered 2022-03-07: 15 mL via INTRAVENOUS

## 2022-03-07 MED ORDER — GADOBENATE DIMEGLUMINE 529 MG/ML IV SOLN
12.0000 mL | Freq: Once | INTRAVENOUS | Status: AC | PRN
  Administered 2022-03-07: 12 mL via INTRAVENOUS

## 2022-03-16 NOTE — Progress Notes (Unsigned)
$'@Patient'V$  ID: Tina Parker, female    DOB: [birth year], 51 y.o.   MRN: [id number]  No chief complaint on file.   Referring provider: Einar Pheasant, MD  HPI:  51 year old female. PMH significant for mild sleep apnea, allergic rhinitis, sinusitis, HTN, dysphagia, hypothyroidism. Patient is followed by Dr. Halford Chessman for sleep apanea and recent saw Dr. Carlis Abbott for cough. HST in 2018 showed AHI 3.5 (weight 160lbs).     Previous [facility] pulmonary encounter: 08/08/2019 Patient contacted today for virtual video visit to go over home sleep test results.  HST on 07/25/2019 showed an average AHI 6.2/hour with minimum SpO2 of 88%.  Discussed treatment options for mild obstructive sleep apnea including weight loss, CPAP, oral appliance and ENT/surgical evaluation. Her sleep is disruptive enough that she would like to try CPAP therapy at this time while also working to lose weight.     03/17/2022- Interim hx Patient presents today for OSA follow-up/ on CPAP.  Started on CPAP back in February 2021 for symptomatic mild sleep apnea  PFTs 02/20/2020 FVC 2.29 (93%), FEV1 2.01 (101%), ratio 88, TLC 85%, DLCOcor 15.94 (87%)  Imaging: 12/07/19 CXR- some mild coarsened interstitium and bronchitis features. Could reflect bronchitis or reactive airway disease         Allergies  Allergen Reactions   Cephalexin Shortness Of Breath   Inh [Isoniazid] Other (See Comments)    Causes hepatitis   Oxycodone Shortness Of Breath    Immunization History  Administered Date(s) Administered   Influenza,inj,Quad PF,6+ Mos 04/11/2013, 03/14/2014, 02/27/2015, 03/17/2016, 03/16/2017, 05/05/2018, 03/25/2019   Moderna Covid-19 Vaccine Bivalent Booster 61yr & up 04/14/2021   PFIZER(Purple Top)SARS-COV-2 Vaccination 09/04/2019, 09/27/2019, 07/05/2020, 01/14/2021   Zoster Recombinat (Shingrix) 04/18/2019    Past Medical History:  Diagnosis Date   Allergic rhinitis    Anemia    Arthritis    RA   Carpal tunnel syndrome     Dysphagia    Esophageal spasm 01/16/2014   GERD (gastroesophageal reflux disease)    Graves disease    remission, no ablation, positive medical treatment   History of hiatal hernia    Hypertension    Migraine headache    migraines   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 90 tablet 1   Azelastine-Fluticasone 137-50 MCG/ACT SUSP Place 1 spray into both nostrils 2 (two) times daily.     budesonide-formoterol (SYMBICORT) 80-4.5 MCG/ACT inhaler 1-2 puffs twice daily (rinse mouth after use) 1 each 5   cholecalciferol (VITAMIN D3) 25 MCG (1000 UNIT) tablet Take 1,000 Units by mouth daily.     citalopram (CELEXA) 40 MG tablet TAKE 1 TABLET(40 MG) BY MOUTH DAILY 30 tablet 5   desloratadine (CLARINEX) 5 MG tablet Take 5 mg by mouth daily.     folic acid (FOLVITE) 1 MG tablet Take 1 mg by mouth daily.     losartan (COZAAR) 100 MG tablet TAKE 1 TABLET(100 MG) BY MOUTH DAILY 30 tablet 2   methotrexate (RHEUMATREX) 2.5 MG tablet Take 22.5 mg by mouth every Monday.  Caution:Chemotherapy. Protect from light. Take 9 tabs po weekly with meals     neomycin-polymyxin-hydrocortisone (CORTISPORIN) 3.5-10000-1 OTIC suspension Apply 1-2 drops daily after soaking and cover with bandaid 10 mL 0   omeprazole (PRILOSEC) 40 MG capsule Take 1 capsule (40 mg total) by mouth 2 (two) times daily. 60 capsule 3   RINVOQ 15 MG TB24 Take 1 tablet by mouth daily.     valACYclovir (VALTREX) 500 MG tablet Take 1 tablet bid x 3 days, then continue one po q day 30 tablet 2   vitamin B-12 (CYANOCOBALAMIN)  100 MCG tablet Take 100 mcg by mouth daily.     WEGOVY 1.7 MG/0.75ML SOAJ INJECT 1.7 MG IN THE MUSCLE ONCE A WEEK 3 mL 2   Facility-Administered Medications Prior to Visit  Medication Dose Route Frequency Provider Last Rate Last Admin   betamethasone acetate-betamethasone sodium phosphate (CELESTONE) injection 12 mg  12 mg Other Once Magnus Sinning, MD          Review of Systems  Review of Systems   Physical Exam  There were no vitals taken for this visit. Physical Exam   Lab Results:  CBC    Component Value Date/Time   WBC 5.3 01/27/2022 0750   RBC 3.46 (L) 01/27/2022 0750   HGB 11.3 (L) 01/27/2022 0750   HCT 33.3 (L) 01/27/2022 0750   PLT 352.0 01/27/2022 0750   MCV 96.5 01/27/2022 0750   MCH 30.8 06/22/2017 0816   MCHC 34.0 01/27/2022 0750   RDW 14.2 01/27/2022 0750   LYMPHSABS 1.9 01/27/2022 0750   MONOABS 0.5 01/27/2022 0750   EOSABS 0.0 01/27/2022 0750   BASOSABS 0.0 01/27/2022 0750    BMET    Component Value Date/Time   NA 136 01/27/2022 0750   K 4.5 01/27/2022 0750   CL 103 01/27/2022 0750   CO2 27 01/27/2022 0750   GLUCOSE 93 01/27/2022 0750   BUN 14 01/27/2022 0750   BUN 8 08/14/2011 0000   CREATININE 0.92 01/27/2022 0750   CALCIUM 9.5 01/27/2022 0750   GFRNONAA >60 06/22/2017 0816   GFRAA >60 06/22/2017 0816    BNP No results found for: "BNP"  ProBNP No results found for: "PROBNP"  Imaging: MR BRAIN W WO CONTRAST  Result Date: 03/07/2022 CLINICAL DATA:  Follow-up meningioma, previously resected. EXAM: MRI HEAD WITHOUT AND WITH CONTRAST TECHNIQUE: Multiplanar, multiecho pulse sequences of the brain and surrounding structures were obtained without and with intravenous contrast. CONTRAST:  88m MULTIHANCE GADOBENATE DIMEGLUMINE 529 MG/ML IV SOLN COMPARISON:  Head MRI 03/17/2021 FINDINGS: Brain: There is no evidence of an acute infarct, intracranial hemorrhage, midline shift, or extra-axial fluid collection. The ventricles are normal in size.  Small T2 hyperintensities in the cerebral white matter bilaterally are unchanged and nonspecific but compatible with mild chronic small vessel ischemic disease. Postsurgical changes are again seen in the left middle cranial fossa with unchanged encephalomalacia in the anterior left temporal lobe. No suspicious enhancement is present in this region to indicate recurrent tumor. A 7 mm nonenhancing extra-axial mass at the inferior aspect of the fourth ventricle is unchanged. Apparent mild enhancement along the ventral, inferior margin of the pons on the axial T1 postcontrast sequence is not confirmed on sagittal or coronal acquisitions. Signal has been variably present in this location on multiple prior studies, and this is favored to be artifact or vascular. Vascular: Major intracranial vascular flow voids are preserved. Skull and upper cervical spine: Left pterional craniotomy. Sinuses/Orbits: Unremarkable orbits. Paranasal sinuses and mastoid  air cells are clear. Other: None. IMPRESSION: 1. Stable postoperative changes in the left middle cranial fossa without evidence of recurrent meningioma. 2. 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 Electronically Signed   By: Logan Bores M.D.   On: 03/07/2022 11:39   CT CARDIAC SCORING  Result Date: 02/25/2022 EXAM: OVER-READ INTERPRETATION  CT CHEST The following report is a limited chest CT over-read performed by radiologist Dr. Rolm Baptise of [facility], [place] on 02/24/2022. This over-read does not include interpretation of cardiac or coronary anatomy or pathology. The coronary calcium score interpretation by the cardiologist is attached. COMPARISON:  None Available. FINDINGS: Vascular: Heart is normal size.  Aorta normal caliber. Mediastinum/Nodes: No adenopathy.  Small hiatal hernia. Lungs/Pleura: Lungs clear.  No effusions. Upper Abdomen: No acute findings Musculoskeletal: Chest wall soft tissues  are unremarkable. No acute bony abnormality. IMPRESSION: No acute extra cardiac abnormality. Small hiatal hernia. Electronically Signed   By: Rolm Baptise M.D.   On: 02/25/2022 08:37   MR BRAIN W WO CONTRAST  Result Date: 02/24/2022 CLINICAL DATA:  Risk stratification EXAM: Coronary Calcium Score TECHNIQUE: The patient was scanned on a Siemens Somatom go.Top Scanner. Axial non-contrast 3 mm slices were carried out through the heart. The data set was analyzed on a dedicated work station and scored using the [place]. FINDINGS: Non-cardiac: See separate report from [facility]. Ascending Aorta: Normal size Pericardium: Normal Coronary arteries: Normal origin of left and right coronary arteries. Distribution of arterial calcifications if present, as noted below; LM 0 LAD 0 LCx 0 RCA 0 Total 0 IMPRESSION AND RECOMMENDATION: 1. Normal coronary calcium score of 0. Patient is low risk for coronary events. 2.  CAC 0, CAC-DRS A0. 3.  Continue heart healthy lifestyle and risk factor modification. Electronically Signed   By: Kate Sable M.D.   On: 02/24/2022 13:32     Assessment & Plan:   No problem-specific Assessment & Plan notes found for this encounter.     Martyn Ehrich, NP 03/16/2022

## 2022-03-17 ENCOUNTER — Ambulatory Visit: Payer: 59 | Admitting: Primary Care

## 2022-03-17 ENCOUNTER — Encounter: Payer: Self-pay | Admitting: Primary Care

## 2022-03-17 VITALS — BP 118/80 | HR 92 | Ht 61.0 in | Wt 136.4 lb

## 2022-03-17 DIAGNOSIS — G4730 Sleep apnea, unspecified: Secondary | ICD-10-CM | POA: Diagnosis not present

## 2022-03-17 NOTE — Assessment & Plan Note (Addendum)
-   History of mild sleep apnea, HST 07/25/2019 showed AHI 6.2 an hour (weight 179lbs).  She was tolerating CPAP until recently.  She has lost 40-50 pounds since original sleep study.  She is having issues with mask fit and pressure settings.  Recommend repeating home sleep study to reevaluate degree of OSA due to weight loss.  If patient still has sleep apnea we can try changing CPAP mask or can consider oral appliance.  Follow-up 1 to 2 weeks after sleep study to review results and treatment options if needed.

## 2022-03-17 NOTE — Patient Instructions (Addendum)
Due to weight loss recommend repeating sleep study For now focus on side sleeping position or elevate head 30 degrees with wedge pillow  Orders: HST  Follow-up Please call to schedule follow-up 1-2 weeks after completing home sleep study to review results and treatment if needed (can be virtual)  Sleep Apnea Sleep apnea affects breathing during sleep. It causes breathing to stop for 10 seconds or more, or to become shallow. People with sleep apnea usually snore loudly. It can also increase the risk of: Heart attack. Stroke. Being very overweight (obese). Diabetes. Heart failure. Irregular heartbeat. High blood pressure. The goal of treatment is to help you breathe normally again. What are the causes?  The most common cause of this condition is a collapsed or blocked airway. There are three kinds of sleep apnea: Obstructive sleep apnea. This is caused by a blocked or collapsed airway. Central sleep apnea. This happens when the brain does not send the right signals to the muscles that control breathing. Mixed sleep apnea. This is a combination of obstructive and central sleep apnea. What increases the risk? Being overweight. Smoking. Having a small airway. Being older. Being female. Drinking alcohol. Taking medicines to calm yourself (sedatives or tranquilizers). Having family members with the condition. Having a tongue or tonsils that are larger than normal. What are the signs or symptoms? Trouble staying asleep. Loud snoring. Headaches in the morning. Waking up gasping. Dry mouth or sore throat in the morning. Being sleepy or tired during the day. If you are sleepy or tired during the day, you may also: Not be able to focus your mind (concentrate). Forget things. Get angry a lot and have mood swings. Feel sad (depressed). Have changes in your personality. Have less interest in sex, if you are female. Be unable to have an erection, if you are female. How is this  treated?  Sleeping on your side. Using a medicine to get rid of mucus in your nose (decongestant). Avoiding the use of alcohol, medicines to help you relax, or certain pain medicines (narcotics). Losing weight, if needed. Changing your diet. Quitting smoking. Using a machine to open your airway while you sleep, such as: An oral appliance. This is a mouthpiece that shifts your lower jaw forward. A CPAP device. This device blows air through a mask when you breathe out (exhale). An EPAP device. This has valves that you put in each nostril. A BIPAP device. This device blows air through a mask when you breathe in (inhale) and breathe out. Having surgery if other treatments do not work. Follow these instructions at home: Lifestyle Make changes that your doctor recommends. Eat a healthy diet. Lose weight if needed. Avoid alcohol, medicines to help you relax, and some pain medicines. Do not smoke or use any products that contain nicotine or tobacco. If you need help quitting, ask your doctor. General instructions Take over-the-counter and prescription medicines only as told by your doctor. If you were given a machine to use while you sleep, use it only as told by your doctor. If you are having surgery, make sure to tell your doctor you have sleep apnea. You may need to bring your device with you. Keep all follow-up visits. Contact a doctor if: The machine that you were given to use during sleep bothers you or does not seem to be working. You do not get better. You get worse. Get help right away if: Your chest hurts. You have trouble breathing in enough air. You have an uncomfortable feeling  in your back, arms, or stomach. You have trouble talking. One side of your body feels weak. A part of your face is hanging down. These symptoms may be an emergency. Get help right away. Call your local emergency services (911 in the [place] Do not wait to see if the symptoms will go away. Do not  drive yourself to the hospital. Summary This condition affects breathing during sleep. The most common cause is a collapsed or blocked airway. The goal of treatment is to help you breathe normally while you sleep. This information is not intended to replace advice given to you by your health care provider. Make sure you discuss any questions you have with your health care provider. Document Revised: 01/23/2021 Document Reviewed: 05/25/2020 Elsevier Patient Education  [place].

## 2022-03-19 ENCOUNTER — Other Ambulatory Visit: Payer: Self-pay | Admitting: Family

## 2022-03-31 ENCOUNTER — Encounter: Payer: Self-pay | Admitting: Podiatry

## 2022-03-31 ENCOUNTER — Ambulatory Visit: Payer: 59 | Admitting: Podiatry

## 2022-03-31 DIAGNOSIS — M21611 Bunion of right foot: Secondary | ICD-10-CM | POA: Diagnosis not present

## 2022-03-31 DIAGNOSIS — M2011 Hallux valgus (acquired), right foot: Secondary | ICD-10-CM

## 2022-03-31 NOTE — Patient Instructions (Signed)
Look for Morton's extension carbon fiber insole on [place]. The extension goes under your big toe. Place this under the insert in your shoe   Posterior Tibial Tendinitis  Posterior tibial tendinitis is irritation of a tendon called the posterior tibial tendon. Your posterior tibial tendon is a cord-like tissue that connects bones of your lower leg and foot to a muscle that: Supports your arch. Helps you raise up on your toes. Helps you turn your foot down and in. This condition causes foot and ankle pain. It can also lead to a flat foot. What are the causes? This condition is most often caused by repeated stress to the tendon (overuse injury). It can also be caused by a sudden injury that stresses the tendon, such as landing on your foot after jumping or falling. What increases the risk? This condition is more likely to develop in: People who play a sport that involves putting a lot of pressure on the feet, such as: Basketball. Tennis. Soccer. Hockey. Runners. Females who are older than 53 years of age and are overweight. People with diabetes. People with decreased foot stability. People with flat feet. What are the signs or symptoms? Symptoms include: Pain in the inner ankle. Pain at the arch of your foot. Pain that gets worse with running, walking, or standing. Swelling on the inside of your ankle and foot. Weakness in your ankle or foot. Inability to stand up on tiptoe. Flattening of the arch of your foot. How is this diagnosed? This condition may be diagnosed based on: Your symptoms. Your medical history. A physical exam. Tests, such as: X-ray. MRI. Ultrasound. How is this treated? This condition may be treated by: Putting ice to the injured area. Taking NSAIDs, such as ibuprofen, to reduce pain and swelling. Wearing a special shoe or shoe insert to support your arch (orthotic). Having physical therapy. Replacing high-impact exercise with low-impact exercise, such  as swimming or cycling. If your symptoms do not improve with these treatments, you may need to wear a splint, removable walking boot, or short leg cast for 6-8 weeks to keep your foot and ankle still (immobilized). Follow these instructions at home: If you have a cast, splint, or boot: Keep it clean and dry. Check the skin around it every day. Tell your health care provider about any concerns. If you have a cast: Do not stick anything inside it to scratch your skin. Doing that increases your risk of infection. You may put lotion on dry skin around the edges of the cast. Do not put lotion on the skin underneath the cast. If you have a splint or boot: Wear it as told by your health care provider. Remove it only as told by your health care provider. Loosen it if your toes tingle, become numb, or turn cold and blue. Bathing Do not take baths, swim, or use a hot tub until your health care provider approves. Ask your health care provider if you may take showers. If your cast, splint, or boot is not waterproof: Do not let it get wet. Cover it with a waterproof covering while you take a bath or a shower. Managing pain and swelling   If directed, put ice on the injured area. If you have a removable splint or boot, remove it as told by your health care provider. Put ice in a plastic bag. Place a towel between your skin and the bag or between your cast and the bag. Leave the ice on for 20 minutes, 2-3 times  a day. Move your toes often to reduce stiffness and swelling. Raise (elevate) the injured area above the level of your heart while you are sitting or lying down. Activity Do not use the injured foot to support your body weight until your health care provider says that you can. Use crutches as told by your health care provider. Do not do activities that make pain or swelling worse. Ask your health care provider when it is safe to drive if you have a cast, splint, or boot on your foot. Return to  your normal activities as told by your health care provider. Ask your health care provider what activities are safe for you. Do exercises as told by your health care provider. General instructions Take over-the-counter and prescription medicines only as told by your health care provider. If you have an orthotic, use it as told by your health care provider. Keep all follow-up visits as told by your health care provider. This is important. How is this prevented? Wear footwear that is appropriate to your athletic activity. Avoid athletic activities that cause pain or swelling in your ankle or foot. Before being active, do range-of-motion and stretching exercises. If you develop pain or swelling while training, stop training. If you have pain or swelling that does not improve after a few days of rest, see your health care provider. If you start a new athletic activity, start gradually so you can build up your strength and flexibility. Contact a health care provider if: Your symptoms get worse. Your symptoms do not improve in 6-8 weeks. You develop new, unexplained symptoms. Your splint, boot, or cast gets damaged. Summary Posterior tibial tendinitis is irritation of a tendon called the posterior tibial tendon. This condition is most often caused by repeated stress to the tendon (overuse injury). This condition causes foot pain and ankle pain. It can also lead to a flat foot. This condition may be treated by not doing high-impact activities, applying ice, having physical therapy, wearing orthotics, and wearing a cast, splint, or boot if needed. This information is not intended to replace advice given to you by your health care provider. Make sure you discuss any questions you have with your health care provider. Document Revised: 10/12/2018 Document Reviewed: 08/19/2018 Elsevier Patient Education  [place].  Posterior Tibial Tendinitis Rehab Ask your health care provider which  exercises are safe for you. Do exercises exactly as told by your health care provider and adjust them as directed. It is normal to feel mild stretching, pulling, tightness, or discomfort as you do these exercises. Stop right away if you feel sudden pain or your pain gets worse. Do not begin these exercises until told by your health care provider. Stretching and range-of-motion exercises These exercises warm up your muscles and joints and improve the movement and flexibility in your ankle and foot. These exercises may also help to relieve pain. Standing wall calf stretch, knee straight   Stand with your hands against a wall. Extend your left / right leg behind you, and bend your front knee slightly. If directed, place a folded washcloth under the arch of your foot for support. Point the toes of your back foot slightly inward. Keeping your heels on the floor and your back knee straight, shift your weight toward the wall. Do not allow your back to arch. You should feel a gentle stretch in your upper left / right calf. Hold this position for 10 seconds. Repeat 10 times. Complete this exercise 2 times a  day. Standing wall calf stretch, knee bent Stand with your hands against a wall. Extend your left / right leg behind you, and bend your front knee slightly. If directed, place a folded washcloth under the arch of your foot for support. Point the toes of your back foot slightly inward. Unlock your back knee so it is bent. Keep your heels on the floor. You should feel a gentle stretch deep in your lower left / right calf. Hold this position for 10 seconds. Repeat 10 times. Complete this exercise 2 times a day. Strengthening exercises These exercises build strength and endurance in your ankle and foot. Endurance is the ability to use your muscles for a long time, even after they get tired. Ankle inversion with band Secure one end of a rubber exercise band or tubing to a fixed object, such as a table leg  or a pole, that will stay still when the band is pulled. Loop the other end of the band around the middle of your left / right foot. Sit on the floor facing the object with your left / right leg extended. The band or tube should be slightly tense when your foot is relaxed. Leading with your big toe, slowly bring your left / right foot and ankle inward, toward your other foot (inversion). Hold this position for 10 seconds. Slowly return your foot to the starting position. Repeat 10 times. Complete this exercise 2 times a day. Towel curls   Sit in a chair on a non-carpeted surface, and put your feet on the floor. Place a towel in front of your feet. Keeping your heel on the floor, put your left / right foot on the towel. Pull the towel toward you by grabbing the towel with your toes and curling them under. Keep your heel on the floor while you do this. Let your toes relax. Grab the towel with your toes again. Keep going until the towel is completely underneath your foot. Repeat 10 times. Complete this exercise 2 times a day. Balance exercise This exercise improves or maintains your balance. Balance is important in preventing falls. Single leg stand Without wearing shoes, stand near a railing or in a doorway. You may hold on to the railing or door frame as needed for balance. Stand on your left / right foot. Keep your big toe down on the floor and try to keep your arch lifted. If balancing in this position is too easy, try the exercise with your eyes closed or while standing on a pillow. Hold this position for 10 seconds. Repeat 10 times. Complete this exercise 2 times a day. This information is not intended to replace advice given to you by your health care provider. Make sure you discuss any questions you have with your health care provider.

## 2022-04-01 ENCOUNTER — Other Ambulatory Visit: Payer: Self-pay

## 2022-04-01 ENCOUNTER — Telehealth: Payer: Self-pay | Admitting: *Deleted

## 2022-04-01 MED ORDER — LOSARTAN POTASSIUM 100 MG PO TABS: ORAL_TABLET | ORAL | 2 refills | Status: DC

## 2022-04-01 NOTE — Telephone Encounter (Signed)
I called and informed the patient that I forgot to give her the air fracture walker that she's going to need for surgery.  She stated she would come by early Friday morning to pick it up.

## 2022-04-02 NOTE — Progress Notes (Signed)
  Subjective:  Patient ID: Tina Parker, female    DOB: July 11, 1970,  MRN: [id number]  Chief Complaint  Patient presents with   Foot Pain    "It's still not doing good."    51 y.o. female presents with the above complaint. History confirmed with patient.  Still very painful.  She is ready to schedule surgery  Objective:  Physical Exam: warm, good capillary refill, no trophic changes or ulcerative lesions, normal DP and PT pulses, and normal sensory exam. Left Foot: She has dystrophic nail with likely onychomycosis, minimal growth proximally, she has a bunion that is painful swollen joint pain with range of motion Right Foot: Bunion painful swollen joint with range of motion mostly dorsal,  No images are attached to the encounter.  Radiographs: Multiple views x-ray of both feet: Bilateral hallux valgus deformity with mild lateral joint space narrowing, slight dorsal spurring Assessment:   No diagnosis found.    Plan:  Patient was evaluated and treated and all questions answered.  Discussed the etiology and treatment including surgical and non surgical treatment for painful bunions.  She has exhausted all non surgical treatment prior to this visit including shoe gear changes and padding.  She desires surgical intervention. We discussed all risks including but not limited to: pain, swelling, infection, scar, numbness which may be temporary or permanent, chronic pain, stiffness, nerve pain or damage, wound healing problems, bone healing problems including delayed or non-union and recurrence. Specifically we discussed the following procedures: Arthrodesis of the right first metatarsal phalangeal joint with bone graft from the heel, we discussed the rationale for this to correct the bunion and also address the arthritis that expect long-term will continue to worsen for her. Informed consent was signed today. Surgery will be scheduled at a mutually agreeable date. Information regarding this  will be forwarded to our surgery scheduler.   Surgical plan:  Procedure: -Right foot MPJ fusion with bone graft from heel  Location: -[facility]  Anesthesia plan: -IV sedation with regional block  Postoperative pain plan: - Tylenol 1000 mg every 6 hours, ibuprofen 600 mg every 6 hours, gabapentin 300 mg every 8 hours x5 days, oxycodone 5 mg 1-2 tabs every 6 hours only as needed  DVT prophylaxis: -None required  WB Restrictions / DME needs: -NWB in CAM boot postop with knee scooter  Return for after surgery.

## 2022-04-03 ENCOUNTER — Encounter: Payer: Self-pay | Admitting: Internal Medicine

## 2022-04-03 ENCOUNTER — Encounter: Payer: Self-pay | Admitting: Podiatry

## 2022-04-03 MED ORDER — VITAMIN D (ERGOCALCIFEROL) 1.25 MG (50000 UNIT) PO CAPS: 50000.0000 [IU] | ORAL_CAPSULE | ORAL | 0 refills | Status: DC

## 2022-04-28 ENCOUNTER — Encounter (INDEPENDENT_AMBULATORY_CARE_PROVIDER_SITE_OTHER): Payer: Self-pay

## 2022-04-29 ENCOUNTER — Other Ambulatory Visit: Payer: Self-pay | Admitting: Internal Medicine

## 2022-05-13 ENCOUNTER — Ambulatory Visit: Payer: 59 | Admitting: Internal Medicine

## 2022-05-19 ENCOUNTER — Encounter: Payer: Self-pay | Admitting: Internal Medicine

## 2022-05-19 ENCOUNTER — Ambulatory Visit: Payer: 59 | Admitting: Internal Medicine

## 2022-05-19 VITALS — BP 128/88 | HR 98 | Ht 61.0 in | Wt 136.6 lb

## 2022-05-19 DIAGNOSIS — E042 Nontoxic multinodular goiter: Secondary | ICD-10-CM | POA: Diagnosis not present

## 2022-05-19 DIAGNOSIS — Z8639 Personal history of other endocrine, nutritional and metabolic disease: Secondary | ICD-10-CM

## 2022-05-19 NOTE — Patient Instructions (Signed)
I ordered a new thyroid U/S for you.  Please come back for a follow-up appointment in 1 year.

## 2022-05-19 NOTE — Progress Notes (Signed)
Patient ID: [name], female   DOB: [birth year], 51 y.o.   MRN: [id number]  HPI  CAMMI CONSALVO is a 51 y.o.-year-old female, initially referred by her PCP, Dr. Nicki Reaper, returning for follow-up for thyroid nodules.  Last visit 1 year ago.  Interim history: She previously had neck pain, dysphagia, hoarseness.  However, the symptoms improved after losing almost 40 pounds on Wegovy.  She still has some chocking (hiatal hernia) and discomfort laying on the R side. She has cough, no congestion, fever, sore throat.  Reviewed and addended history: At 51 y/o, she had Graves ds. >> started on medication >> after she gave birth to her daughter in 35 >> developed hypothyroidism >> started LT4 >> continued for several years >> now off for "years".  Of note, she had a normal swallowing study in 2015, which only showed a small hiatal hernia.  Pt had a thyroid nodule palpated by PCP in 2020 >> sent for a thyroid U/S - this showed a L thyroid nodule:  Thyroid U/S (05/16/2019): Left inferior, isoechoic, 1.7 cm,  thyroid nodule      FNA of this nodule (07/11/2018): Benign  After the Bx, she developed dysphagia >> had EGD >> Es stretched.  Barium swallow (04/16/2020): Small sliding hiatal hernia. No evidence of esophageal stricture or extrinsic compression. Gastroesophageal reflux to level of midthoracic esophagus.  Thyroid U/S (04/18/2020): Parenchymal Echotexture: Normal Isthmus: 0.3 cm, previously 0.2 cm Right lobe: 3.5 x 1.0 x 1.3 cm, previously 3.5 x 0.9 x 1.2 cm Left lobe: 3.9 x 1.1 x 1.4 cm, previously 3.6 x 1.3 x 1.3 cm _________________________________________________________   Nodule # 1: Location: Right; Mid Maximum size: 0.6 cm; Other 2 dimensions: 0.5 x 0.4 cm Composition: solid/almost completely solid (2) Echogenicity: isoechoic (1) *Given size (<1.4 cm) and appearance, this nodule does NOT meet TI-RADS criteria for biopsy or dedicated follow-up.   _________________________________________________________   Nodule # 2: Prior biopsy: No Location: Left; Mid Maximum size: 1.6 cm; Other 2 dimensions: 1.1 x 0.9 cm, previously, 1.7 x 1.3 x 0.9 cm Composition: solid/almost completely solid (2) Echogenicity: isoechoic (1)  *Given size (>/= 1.5 - 2.4 cm) and appearance, a follow-up ultrasound in 1 year should be considered based on TI-RADS criteria. _________________________________________________________   IMPRESSION: Similar appearing solid left thyroid nodule (labeled 2), again categorized as TI-RADS 3. Recommend annual ultrasound follow-up until 5 years of stability are established. This study marks 1 year stability.  Thyroid U/S (06/04/2021): Parenchymal Echotexture: Moderately heterogenous  Isthmus: 0.4 cm  Right lobe: 3.4 cm x 1.0 cm x 1.1 cm  Left lobe: 4.2 cm x 1.2 cm x 1.4 cm  _____________________________________________________________________   Nodule labeled 1 in the right mid thyroid, 6 mm, unchanged, and does not meet criteria for further surveillance.   Nodule labeled 2 in the left mid thyroid, previously 1.6 cm, currently measuring 2.0 cm. Nodule remains TR 3 characteristics and meets criteria for further surveillance.     No adenopathy   Recommendations follow those established by the new ACR TI-RADS criteria (J Am Coll Radiol 2017;14:587-595).   IMPRESSION: Left mid thyroid nodule meets criteria for continued surveillance, as designated by the newly established ACR TI-RADS criteria. Surveillance ultrasound study recommended to be performed annually up to 5 years.  She does have GERD - better after losing weight. Prev. On Dexilant >> had to come off 2/2 insurance coverage, now Omeprazole - but not as good.  I reviewed pt's thyroid tests: Lab Results  Component Value  Date   TSH 1.73 01/27/2022   TSH 3.10 06/25/2021   TSH 2.38 02/27/2021   TSH 1.59 04/09/2020   TSH 1.97 04/25/2019   TSH 2.91  10/11/2018   TSH 2.76 12/21/2017   TSH 2.48 01/19/2017   TSH 1.71 03/17/2016   TSH 3.06 03/13/2014   FREET4 0.63 04/09/2020   FREET4 0.71 12/21/2017     No FH of thyroid ds. + FH of thyroid cancer in M first cousin. No h/o radiation tx to head or neck. No steroid use. No herbal supplements. No Biotin supplements or Hair, Skin and Nails vitamins.  Pt also has a history of leukopenia, meningioma - surgically removed, HTN, HL, RA - on Actemra and MTX - but not working well, vitamin D deficiency. On B12. Off and on Prednisone - finished 02/2020.  ROS: + see HPI  Past Medical History:  Diagnosis Date   Allergic rhinitis    Anemia    Arthritis    RA   Carpal tunnel syndrome    Dysphagia    Esophageal spasm 01/16/2014   GERD (gastroesophageal reflux disease)    Graves disease    remission, no ablation, positive medical treatment   History of hiatal hernia    Hypertension    Migraine headache    migraines   PPD positive    hepatitis secondary to [place]   Pure hypercholesterolemia    Rheumatoid arthritis(714.0)    positive anti CCP antibodies, positive RF, oligo-articular, MTX   Sciatica    Past Surgical History:  Procedure Laterality Date   CARPAL TUNNEL RELEASE Right    [place]   COLONOSCOPY  03/14/2002, 08/16/2007, 03/07/2013   [facility]-father   COLONOSCOPY WITH PROPOFOL N/A 06/21/2018   Procedure: COLONOSCOPY WITH PROPOFOL;  Surgeon: Manya Silvas, MD;  Location: [facility] ENDOSCOPY;  Service: Endoscopy;  Laterality: N/A;   CRANIOTOMY Left 06/25/2017   Procedure: CRANIOTOMY TEMPORAL LEFT FOR TUMOR RESECTION;  Surgeon: Ashok Pall, MD;  Location: [facility];  Service: Neurosurgery;  Laterality: Left;   ESOPHAGOGASTRODUODENOSCOPY  03/14/2002, 03/07/2013   ESOPHAGOGASTRODUODENOSCOPY (EGD) WITH PROPOFOL N/A 06/21/2018   Procedure: ESOPHAGOGASTRODUODENOSCOPY (EGD) WITH PROPOFOL;  Surgeon: Manya Silvas, MD;  [facility];  Service: Endoscopy;  Laterality: N/A;    ESOPHAGOGASTRODUODENOSCOPY (EGD) WITH PROPOFOL N/A 07/15/2019   Procedure: ESOPHAGOGASTRODUODENOSCOPY (EGD) WITH PROPOFOL;  Surgeon: Jonathon Bellows, MD;  Location: [facility] ENDOSCOPY;  Service: Gastroenterology;  Laterality: N/A;   Post Septoplasty and turbinate reduction  2007   TRIGGER FINGER RELEASE     Social History   Socioeconomic History   Marital status: Married    Spouse name: Not on file   Number of children: 2   Years of education: Not on file   Highest education level: Not on file  Occupational History   Occupation: Hairstylist  Tobacco Use   Smoking status: Never   Smokeless tobacco: Never  Vaping Use   Vaping Use: Never used  Substance and Sexual Activity   Alcohol use: Yes    Comment: wine cooler 2 drinks 2x a week/ OCCASIONALLY   Drug use: No   Sexual activity: Not on file  Other Topics Concern   Not on file  Social History Narrative   Hairdresser    Social Determinants of Health   Financial Resource Strain: Low Risk  (03/13/2021)   Overall Financial Resource Strain (CARDIA)    Difficulty of Paying Living Expenses: Not hard at all  Food Insecurity: No Food Insecurity (11/01/2020)   Hunger Vital Sign  Worried About Charity fundraiser in the Last Year: Never true    [place] in the Last Year: Never true  Transportation Needs: Not on file  Physical Activity: Insufficiently Active (11/01/2020)   Exercise Vital Sign    Days of Exercise per Week: 2 days    Minutes of Exercise per Session: 20 min  Stress: Not on file  Social Connections: Not on file  Intimate Partner Violence: Not on file   Current Outpatient Medications on File Prior to Visit  Medication Sig Dispense Refill   amLODipine (NORVASC) 5 MG tablet Take 1 tablet (5 mg total) by mouth daily. 90 tablet 1   Azelastine-Fluticasone 137-50 MCG/ACT SUSP Place 1 spray into both nostrils 2 (two) times daily.     budesonide-formoterol (SYMBICORT) 80-4.5 MCG/ACT inhaler 1-2 puffs twice daily (rinse mouth  after use) 1 each 5   cholecalciferol (VITAMIN D3) 25 MCG (1000 UNIT) tablet Take 1,000 Units by mouth daily.     citalopram (CELEXA) 40 MG tablet TAKE 1 TABLET(40 MG) BY MOUTH DAILY 30 tablet 5   desloratadine (CLARINEX) 5 MG tablet Take 5 mg by mouth daily.     folic acid (FOLVITE) 1 MG tablet Take 1 mg by mouth daily.     losartan (COZAAR) 100 MG tablet TAKE ONE TABLET BY MOUTH DAILY. 30 tablet 2   methotrexate (RHEUMATREX) 2.5 MG tablet Take 22.5 mg by mouth every Monday. Caution:Chemotherapy. Protect from light. Take 9 tabs po weekly with meals     neomycin-polymyxin-hydrocortisone (CORTISPORIN) 3.5-10000-1 OTIC suspension Apply 1-2 drops daily after soaking and cover with bandaid 10 mL 0   omeprazole (PRILOSEC) 40 MG capsule Take 1 capsule (40 mg total) by mouth 2 (two) times daily. 60 capsule 3   RINVOQ 15 MG TB24 Take 1 tablet by mouth daily.     valACYclovir (VALTREX) 500 MG tablet TAKE 1 TABLET BY MOUTH TWICE DAILY FOR 3 DAYS. CONTINUE 1 BY MOUTH EVERY DAY 30 tablet 2   vitamin B-12 (CYANOCOBALAMIN) 100 MCG tablet Take 100 mcg by mouth daily.     Vitamin D, Ergocalciferol, (DRISDOL) 1.25 MG (50000 UNIT) CAPS capsule Take 1 capsule (50,000 Units total) by mouth every 7 (seven) days. 8 capsule 0   WEGOVY 1.7 MG/0.75ML SOAJ INJECT 1.7 MG IN THE MUSCLE ONCE A WEEK 3 mL 2   Current Facility-Administered Medications on File Prior to Visit  Medication Dose Route Frequency Provider Last Rate Last Admin   betamethasone acetate-betamethasone sodium phosphate (CELESTONE) injection 12 mg  12 mg Other Once Magnus Sinning, MD       Allergies  Allergen Reactions   Cephalexin Shortness Of Breath   Inh [Isoniazid] Other (See Comments)    Causes hepatitis   Oxycodone Shortness Of Breath   Family History  Problem Relation Age of Onset   Cancer Mother        Breast Cancer   Breast cancer Mother 16   Cancer Father        Colon Cancer   Heart disease Father        Hx of MI   Hypertension  Father    Diabetes Father    Hyperlipidemia Father    Cancer Maternal Grandmother        lung cancer   Cancer Maternal Uncle        esophageal    PE: BP 128/88 (BP Location: Left Arm, Patient Position: Sitting, Cuff Size: Normal)   Pulse 98   Ht '5\' 1"'$  (  1.549 m)   Wt 136 lb 9.6 oz (62 kg)   SpO2 96%   BMI 25.81 kg/m  Wt Readings from Last 3 Encounters:  05/19/22 136 lb 9.6 oz (62 kg)  03/17/22 136 lb 6.4 oz (61.9 kg)  02/04/22 135 lb 3.2 oz (61.3 kg)   Constitutional: normal weight, in NAD Eyes:  EOMI, no exophthalmos ENT: no neck masses, no cervical lymphadenopathy Cardiovascular: tachycardia, RR, No MRG Respiratory: CTA B Musculoskeletal: no deformities Skin:no rashes Neurological: L tremor with outstretched hand  ASSESSMENT: 1.  Thyroid nodules  2.  History of Graves' disease  PLAN: 1.  Thyroid nodules -Patient with a history of 2 thyroid nodules, 1 subcentimeter, in the right thyroid lobe, not worrisome, and 1 larger, in the left thyroid lobe, which was previously biopsied with benign results in 06/2018.  This nodule appears to be isoechoic and without microcalcifications, internal blood flow, taller than wide disposition, irregular margins.  On the latest ultrasound, the nodule appears to be measuring 2 cm, slightly larger than on the last ultrasound, on which it was measured at 1.6 cm.  The recommendation was for continuing follow-up. -Patient had a barium swallow study that did not show external compression of the esophagus from the thyroid nodule -Of note, patient does not have a family history of thyroid cancer in more than 2 members or personal history of radiation therapy to head and neck, favoring benignity -Also, the patient has no neck compression symptoms except pressure and discomfort when laying on the R side.  She did have more neck compression sxs before, but they improved after losing weight before last visit.  At this visit, she also describes a cough,  this does not appear to be related to her thyroid. -Due to the increase in size of the left thyroid nodule, we discussed about repeating a thyroid ultrasound now -We did discuss that if her neck compression symptoms become very bothersome, we can attempt thyroid lobectomy, but would not recommend this unless we absolutely have to do it since there is a risk of hypothyroidism and lifelong need for levothyroxine after surgery -I plan to see her back in a year  2.  History of Graves' disease - resolved -We reviewed together her latest TSH which was normal in 12/2021 -She still has left hand tremor as a sequela of her Graves' disease -She has tachycardia and higher blood pressure, but she mentions that she has whitecoat hypertension.  Blood pressure was much better on repeat. -No signs of active Graves' ophthalmopathy  Orders Placed This Encounter  Procedures   US THYROID   Thyroid U/S (06/02/2022): Parenchymal Echotexture: Mildly heterogeneous  Isthmus: 0.3 cm  Right lobe: 4.0 x 0.8 x 0.9 cm  Left lobe: 4.0 x 1.1 x 1.6 cm  _________________________________________________________   Estimated total number of nodules >/= 1 cm: 1 _________________________________________________________   Nodule 1: 0.6 x 0.5 x 0.4 cm isoechoic solid right inferior thyroid nodule is unchanged from prior examination and does not meet criteria for imaging surveillance or FNA.  _________________________________________________________   Nodule # 2:  Prior biopsy: No  Location: Left; mid  Maximum size: 2.1 cm; Other 2 dimensions: 1.2 x 1.2 cm, previously, 2.0 x 1.4 x 0.8 cm  Composition: solid/almost completely solid (2)  Echogenicity: isoechoic (1) *Given size (>/= 1.5 - 2.4 cm) and appearance, a follow-up ultrasound in 1 year should be considered based on TI-RADS criteria.  _________________________________________________________   IMPRESSION: Nodule 2 (TI-RADS 3), measuring 2.1 cm, located in  the  mid left thyroid lobe is not significantly changed in size since prior examination from 04/16/2020 and still meets criteria for imaging follow-up. Annual ultrasound surveillance is recommended until 5 years of stability is documented.  Philemon Kingdom, MD PhD [facility] Endocrinology

## 2022-05-23 ENCOUNTER — Other Ambulatory Visit: Payer: Self-pay | Admitting: Internal Medicine

## 2022-05-24 ENCOUNTER — Encounter: Payer: Self-pay | Admitting: Podiatry

## 2022-05-28 ENCOUNTER — Telehealth: Payer: Self-pay | Admitting: Podiatry

## 2022-05-28 NOTE — Telephone Encounter (Signed)
DOS: 06/27/2022  [employer] Effective 11/28/2021  Hallux MPJ Fusion Rt (62863) Bone Graft Rt (20900)  Deductible: $0 Out-of-Pocket: $500 with $211 remaining CoInsurance: 0%  Notification or Prior Authorization is not required for the requested services  This [employer] plan does not currently require a prior authorization for these services. If you have general questions about the prior authorization requirements, please call us at [phone] or visit [email or web address] > Clinician Resources > Advance and Admission Notification Requirements. The number above acknowledges your notification. Please write this number down for future reference. Notification is not a guarantee of coverage or payment.  Decision ID #:[id number]  The number above acknowledges your inquiry and our response. Please write this number down and refer to it for future inquiries. Coverage and payment for an item or service is governed by the member's benefit plan document, and, if applicable, the provider's participation agreement with the Health Plan.

## 2022-05-31 ENCOUNTER — Other Ambulatory Visit: Payer: Self-pay | Admitting: Podiatry

## 2022-06-02 ENCOUNTER — Other Ambulatory Visit (INDEPENDENT_AMBULATORY_CARE_PROVIDER_SITE_OTHER): Payer: 59

## 2022-06-02 ENCOUNTER — Ambulatory Visit
Admission: RE | Admit: 2022-06-02 | Discharge: 2022-06-02 | Disposition: A | Discharge status: Home / Self Care | Payer: 59 | Source: Ambulatory Visit | Attending: Internal Medicine | Admitting: Internal Medicine

## 2022-06-02 DIAGNOSIS — I10 Essential (primary) hypertension: Secondary | ICD-10-CM

## 2022-06-02 DIAGNOSIS — E7800 Pure hypercholesterolemia, unspecified: Secondary | ICD-10-CM | POA: Diagnosis not present

## 2022-06-02 DIAGNOSIS — R739 Hyperglycemia, unspecified: Secondary | ICD-10-CM | POA: Diagnosis not present

## 2022-06-02 DIAGNOSIS — D649 Anemia, unspecified: Secondary | ICD-10-CM

## 2022-06-02 DIAGNOSIS — Z114 Encounter for screening for human immunodeficiency virus [HIV]: Secondary | ICD-10-CM

## 2022-06-02 DIAGNOSIS — Z1159 Encounter for screening for other viral diseases: Secondary | ICD-10-CM

## 2022-06-02 DIAGNOSIS — E042 Nontoxic multinodular goiter: Secondary | ICD-10-CM

## 2022-06-02 LAB — CBC WITH DIFFERENTIAL/PLATELET
Basophils Absolute: 0 10*3/uL (ref 0.0–0.1)
Basophils Relative: 0.5 % (ref 0.0–3.0)
Eosinophils Absolute: 0 10*3/uL (ref 0.0–0.7)
Eosinophils Relative: 0.8 % (ref 0.0–5.0)
HCT: 31.7 % — ABNORMAL LOW (ref 36.0–46.0)
Hemoglobin: 10.8 g/dL — ABNORMAL LOW (ref 12.0–15.0)
Lymphocytes Relative: 21.1 % (ref 12.0–46.0)
Lymphs Abs: 1.2 10*3/uL (ref 0.7–4.0)
MCHC: 34 g/dL (ref 30.0–36.0)
MCV: 96.7 fl (ref 78.0–100.0)
Monocytes Absolute: 0.7 10*3/uL (ref 0.1–1.0)
Monocytes Relative: 11.5 % (ref 3.0–12.0)
Neutro Abs: 3.8 10*3/uL (ref 1.4–7.7)
Neutrophils Relative %: 66.1 % (ref 43.0–77.0)
Platelets: 359 10*3/uL (ref 150.0–400.0)
RBC: 3.28 Mil/uL — ABNORMAL LOW (ref 3.87–5.11)
RDW: 13.7 % (ref 11.5–15.5)
WBC: 5.8 10*3/uL (ref 4.0–10.5)

## 2022-06-02 LAB — LIPID PANEL
Cholesterol: 248 mg/dL — ABNORMAL HIGH (ref 0–200)
HDL: 72.2 mg/dL (ref >39.00)
LDL Cholesterol: 166 mg/dL — ABNORMAL HIGH (ref 0–99)
NonHDL: 175.4
Total CHOL/HDL Ratio: 3
Triglycerides: 46 mg/dL (ref 0.0–149.0)
VLDL: 9.2 mg/dL (ref 0.0–40.0)

## 2022-06-02 LAB — BASIC METABOLIC PANEL
BUN: 16 mg/dL (ref 6–23)
CO2: 27 mEq/L (ref 19–32)
Calcium: 9.3 mg/dL (ref 8.4–10.5)
Chloride: 104 mEq/L (ref 96–112)
Creatinine, Ser: 1.01 mg/dL (ref 0.40–1.20)
GFR: 63.7 mL/min (ref >60.00)
Glucose, Bld: 77 mg/dL (ref 70–99)
Potassium: 4.7 mEq/L (ref 3.5–5.1)
Sodium: 136 mEq/L (ref 135–145)

## 2022-06-02 LAB — HEPATIC FUNCTION PANEL
ALT: 16 U/L (ref 0–35)
AST: 23 U/L (ref 0–37)
Albumin: 3.9 g/dL (ref 3.5–5.2)
Alkaline Phosphatase: 67 U/L (ref 39–117)
Bilirubin, Direct: 0.1 mg/dL (ref 0.0–0.3)
Total Bilirubin: 0.6 mg/dL (ref 0.2–1.2)
Total Protein: 6.7 g/dL (ref 6.0–8.3)

## 2022-06-02 LAB — HEMOGLOBIN A1C: Hgb A1c MFr Bld: 5.4 % (ref 4.6–6.5)

## 2022-06-03 ENCOUNTER — Other Ambulatory Visit (INDEPENDENT_AMBULATORY_CARE_PROVIDER_SITE_OTHER): Payer: 59

## 2022-06-03 ENCOUNTER — Other Ambulatory Visit: Payer: Self-pay | Admitting: Internal Medicine

## 2022-06-03 DIAGNOSIS — D649 Anemia, unspecified: Secondary | ICD-10-CM | POA: Diagnosis not present

## 2022-06-03 LAB — VITAMIN B12: Vitamin B-12: 392 pg/mL (ref 211–911)

## 2022-06-03 LAB — HIV ANTIBODY (ROUTINE TESTING W REFLEX): HIV 1&2 Ab, 4th Generation: NONREACTIVE

## 2022-06-03 LAB — IBC + FERRITIN
Ferritin: 141.9 ng/mL (ref 10.0–291.0)
Iron: 105 ug/dL (ref 42–145)
Saturation Ratios: 32.6 % (ref 20.0–50.0)
TIBC: 322 ug/dL (ref 250.0–450.0)
Transferrin: 230 mg/dL (ref 212.0–360.0)

## 2022-06-03 LAB — HEPATITIS C ANTIBODY: Hepatitis C Ab: NONREACTIVE

## 2022-06-03 NOTE — Progress Notes (Signed)
Add on labs ordered.  Iron studies and B12

## 2022-06-09 ENCOUNTER — Ambulatory Visit: Payer: 59

## 2022-06-09 ENCOUNTER — Telehealth: Payer: Self-pay | Admitting: Internal Medicine

## 2022-06-09 ENCOUNTER — Encounter: Payer: Self-pay | Admitting: Internal Medicine

## 2022-06-09 ENCOUNTER — Ambulatory Visit: Payer: 59 | Admitting: Internal Medicine

## 2022-06-09 DIAGNOSIS — G4730 Sleep apnea, unspecified: Secondary | ICD-10-CM

## 2022-06-09 DIAGNOSIS — G4733 Obstructive sleep apnea (adult) (pediatric): Secondary | ICD-10-CM

## 2022-06-09 NOTE — Telephone Encounter (Signed)
Patients appointment today at 1pm, 06/09/22 needs to be reschedule before 06/27/22, she is having surgery.

## 2022-06-09 NOTE — Telephone Encounter (Signed)
Will hold for cancellation

## 2022-06-09 NOTE — Telephone Encounter (Signed)
Pt aware.

## 2022-06-10 ENCOUNTER — Telehealth: Payer: Self-pay | Admitting: Primary Care

## 2022-06-10 NOTE — Telephone Encounter (Signed)
Office visit changed to video visit per patient request. NFN att.

## 2022-06-10 NOTE — Telephone Encounter (Signed)
She had it yesterday and its waiting to be read

## 2022-06-10 NOTE — Telephone Encounter (Signed)
Please advise if pt has home sleep study as of today? Pt is requesting to change OV to video visit.

## 2022-06-11 ENCOUNTER — Encounter: Payer: Self-pay | Admitting: Podiatry

## 2022-06-13 DIAGNOSIS — G4733 Obstructive sleep apnea (adult) (pediatric): Secondary | ICD-10-CM | POA: Diagnosis not present

## 2022-06-16 ENCOUNTER — Encounter: Payer: Self-pay | Admitting: Internal Medicine

## 2022-06-16 ENCOUNTER — Ambulatory Visit: Payer: 59 | Admitting: Internal Medicine

## 2022-06-16 VITALS — BP 128/82 | HR 110 | Temp 98.3°F | Ht 61.0 in | Wt 138.0 lb

## 2022-06-16 DIAGNOSIS — Z01818 Encounter for other preprocedural examination: Secondary | ICD-10-CM | POA: Diagnosis not present

## 2022-06-16 DIAGNOSIS — E559 Vitamin D deficiency, unspecified: Secondary | ICD-10-CM

## 2022-06-16 DIAGNOSIS — E7800 Pure hypercholesterolemia, unspecified: Secondary | ICD-10-CM

## 2022-06-16 DIAGNOSIS — R739 Hyperglycemia, unspecified: Secondary | ICD-10-CM

## 2022-06-16 DIAGNOSIS — D329 Benign neoplasm of meninges, unspecified: Secondary | ICD-10-CM

## 2022-06-16 DIAGNOSIS — D649 Anemia, unspecified: Secondary | ICD-10-CM | POA: Diagnosis not present

## 2022-06-16 DIAGNOSIS — G4730 Sleep apnea, unspecified: Secondary | ICD-10-CM

## 2022-06-16 DIAGNOSIS — E042 Nontoxic multinodular goiter: Secondary | ICD-10-CM

## 2022-06-16 DIAGNOSIS — J309 Allergic rhinitis, unspecified: Secondary | ICD-10-CM

## 2022-06-16 DIAGNOSIS — E039 Hypothyroidism, unspecified: Secondary | ICD-10-CM

## 2022-06-16 DIAGNOSIS — K219 Gastro-esophageal reflux disease without esophagitis: Secondary | ICD-10-CM

## 2022-06-16 DIAGNOSIS — F419 Anxiety disorder, unspecified: Secondary | ICD-10-CM | POA: Diagnosis not present

## 2022-06-16 DIAGNOSIS — I10 Essential (primary) hypertension: Secondary | ICD-10-CM

## 2022-06-16 DIAGNOSIS — M059 Rheumatoid arthritis with rheumatoid factor, unspecified: Secondary | ICD-10-CM

## 2022-06-16 DIAGNOSIS — R1310 Dysphagia, unspecified: Secondary | ICD-10-CM

## 2022-06-16 NOTE — Progress Notes (Signed)
Subjective:    Patient ID: Tina Parker, female    DOB: 07/30/1970, 51 y.o.   MRN: [id number]  Patient here for  Chief Complaint  Patient presents with   Pre-op Exam    HPI Here for pre op evaluation.  Planning for foot surgery 06/27/22.  Reports she is doing well.  Increased foot pain.  Limiting activity.  No chest pain or sob.  No increased cough or congestion.  Allergy symptoms stable.  No acid reflux.  No nausea or vomiting.  No bowel change reported.     Past Medical History:  Diagnosis Date   Allergic rhinitis    Anemia    Arthritis    RA   Carpal tunnel syndrome    Dysphagia    Esophageal spasm 01/16/2014   GERD (gastroesophageal reflux disease)    Graves disease    remission, no ablation, positive medical treatment   History of hiatal hernia    Hypertension    Migraine headache    migraines   PPD positive    hepatitis secondary to [place]   Pure hypercholesterolemia    Rheumatoid arthritis(714.0)    positive anti CCP antibodies, positive RF, oligo-articular, MTX   Sciatica    Past Surgical History:  Procedure Laterality Date   CARPAL TUNNEL RELEASE Right    [place]   COLONOSCOPY  03/14/2002, 08/16/2007, 03/07/2013   [facility]-father   COLONOSCOPY WITH PROPOFOL N/A 06/21/2018   Procedure: COLONOSCOPY WITH PROPOFOL;  Surgeon: Manya Silvas, MD;  Location: [facility] ENDOSCOPY;  Service: Endoscopy;  Laterality: N/A;   CRANIOTOMY Left 06/25/2017   Procedure: CRANIOTOMY TEMPORAL LEFT FOR TUMOR RESECTION;  Surgeon: Ashok Pall, MD;  Location: [place];  Service: Neurosurgery;  Laterality: Left;   ESOPHAGOGASTRODUODENOSCOPY  03/14/2002, 03/07/2013   ESOPHAGOGASTRODUODENOSCOPY (EGD) WITH PROPOFOL N/A 06/21/2018   Procedure: ESOPHAGOGASTRODUODENOSCOPY (EGD) WITH PROPOFOL;  Surgeon: Manya Silvas, MD;  Location: [facility] ENDOSCOPY;  Service: Endoscopy;  Laterality: N/A;   ESOPHAGOGASTRODUODENOSCOPY (EGD) WITH PROPOFOL N/A 07/15/2019   Procedure:  ESOPHAGOGASTRODUODENOSCOPY (EGD) WITH PROPOFOL;  Surgeon: Jonathon Bellows, MD;  Location: [facility] ENDOSCOPY;  Service: Gastroenterology;  Laterality: N/A;   Post Septoplasty and turbinate reduction  2007   TRIGGER FINGER RELEASE     Family History  Problem Relation Age of Onset   Cancer Mother        Breast Cancer   Breast cancer Mother 24   Cancer Father        Colon Cancer   Heart disease Father        Hx of MI   Hypertension Father    Diabetes Father    Hyperlipidemia Father    Cancer Maternal Grandmother        lung cancer   Cancer Maternal Uncle        esophageal   Social History   Socioeconomic History   Marital status: Married    Spouse name: Not on file   Number of children: 2   Years of education: Not on file   Highest education level: Not on file  Occupational History   Occupation: Hairstylist  Tobacco Use   Smoking status: Never   Smokeless tobacco: Never  Vaping Use   Vaping Use: Never used  Substance and Sexual Activity   Alcohol use: Yes    Comment: wine cooler 2 drinks 2x a week/ OCCASIONALLY   Drug use: No   Sexual activity: Not on file  Other Topics Concern   Not on file  Social  History Narrative   Hairdresser    Social Determinants of Health   Financial Resource Strain: Low Risk  (03/13/2021)   Overall Financial Resource Strain (CARDIA)    Difficulty of Paying Living Expenses: Not hard at all  Food Insecurity: No Food Insecurity (11/01/2020)   Hunger Vital Sign    Worried About Running Out of Food in the Last Year: Never true    Ran Out of Food in the Last Year: Never true  Transportation Needs: Not on file  Physical Activity: Insufficiently Active (11/01/2020)   Exercise Vital Sign    Days of Exercise per Week: 2 days    Minutes of Exercise per Session: 20 min  Stress: Not on file  Social Connections: Not on file     Review of Systems  Constitutional:  Negative for appetite change and unexpected weight change.  HENT:  Negative for sinus  pressure.        No increased congestion.    Respiratory:  Negative for cough, chest tightness and shortness of breath.   Cardiovascular:  Negative for chest pain, palpitations and leg swelling.  Gastrointestinal:  Negative for abdominal pain, diarrhea, nausea and vomiting.  Genitourinary:  Negative for difficulty urinating and dysuria.  Musculoskeletal:  Negative for joint swelling and myalgias.  Skin:  Negative for color change and rash.  Neurological:  Negative for dizziness and headaches.  Psychiatric/Behavioral:  Negative for agitation and dysphoric mood.        Objective:     BP 128/82   Pulse (!) 110   Temp 98.3 F (36.8 C)   Ht _0  (1.549 m)   Wt 138 lb (62.6 kg)   SpO2 96%   BMI 26.07 kg/m  Wt Readings from Last 3 Encounters:  06/16/22 138 lb (62.6 kg)  05/19/22 136 lb 9.6 oz (62 kg)  03/17/22 136 lb 6.4 oz (61.9 kg)    Physical Exam Vitals reviewed.  Constitutional:      General: She is not in acute distress.    Appearance: Normal appearance.  HENT:     Head: Normocephalic and atraumatic.     Right Ear: External ear normal.     Left Ear: External ear normal.  Eyes:     General: No scleral icterus.       Right eye: No discharge.        Left eye: No discharge.     Conjunctiva/sclera: Conjunctivae normal.  Neck:     Thyroid: No thyromegaly.  Cardiovascular:     Rate and Rhythm: Normal rate and regular rhythm.  Pulmonary:     Effort: No respiratory distress.     Breath sounds: Normal breath sounds. No wheezing.  Abdominal:     General: Bowel sounds are normal.     Palpations: Abdomen is soft.     Tenderness: There is no abdominal tenderness.  Musculoskeletal:        General: No swelling or tenderness.     Cervical back: Neck supple. No tenderness.  Lymphadenopathy:     Cervical: No cervical adenopathy.  Skin:    Findings: No erythema or rash.  Neurological:     Mental Status: She is alert.  Psychiatric:        Mood and Affect: Mood normal.         Behavior: Behavior normal.      Outpatient Encounter Medications as of 06/16/2022  Medication Sig   amLODipine (NORVASC) 5 MG tablet Take 1 tablet (5 mg total) by mouth daily.  Azelastine-Fluticasone 137-50 MCG/ACT SUSP Place 1 spray into both nostrils 2 (two) times daily.   budesonide-formoterol (SYMBICORT) 80-4.5 MCG/ACT inhaler 1-2 puffs twice daily (rinse mouth after use)   cholecalciferol (VITAMIN D3) 25 MCG (1000 UNIT) tablet Take 1,000 Units by mouth daily.   citalopram (CELEXA) 40 MG tablet TAKE 1 TABLET(40 MG) BY MOUTH DAILY   desloratadine (CLARINEX) 5 MG tablet Take 5 mg by mouth daily.   folic acid (FOLVITE) 1 MG tablet Take 1 mg by mouth daily.   losartan (COZAAR) 100 MG tablet TAKE ONE TABLET BY MOUTH DAILY.   methotrexate (RHEUMATREX) 2.5 MG tablet Take 22.5 mg by mouth every Monday. Caution:Chemotherapy. Protect from light. Take 9 tabs po weekly with meals   neomycin-polymyxin-hydrocortisone (CORTISPORIN) 3.5-10000-1 OTIC suspension Apply 1-2 drops daily after soaking and cover with bandaid   omeprazole (PRILOSEC) 40 MG capsule Take 1 capsule (40 mg total) by mouth 2 (two) times daily.   RINVOQ 15 MG TB24 Take 1 tablet by mouth daily.   valACYclovir (VALTREX) 500 MG tablet TAKE 1 TABLET BY MOUTH TWICE DAILY FOR 3 DAYS. CONTINUE 1 BY MOUTH EVERY DAY   vitamin B-12 (CYANOCOBALAMIN) 100 MCG tablet Take 100 mcg by mouth daily.   Vitamin D, Ergocalciferol, (DRISDOL) 1.25 MG (50000 UNIT) CAPS capsule TAKE 1 CAPSULE BY MOUTH EVERY 7 DAYS   WEGOVY 1.7 MG/0.75ML SOAJ INJECT 1.7 MG INTO SKIN ONCE A WEEK FOR 28 DAYS   Facility-Administered Encounter Medications as of 06/16/2022  Medication   betamethasone acetate-betamethasone sodium phosphate (CELESTONE) injection 12 mg     Lab Results  Component Value Date   WBC 5.8 06/02/2022   HGB 10.8 (L) 06/02/2022   HCT 31.7 (L) 06/02/2022   PLT 359.0 06/02/2022   GLUCOSE 77 06/02/2022   CHOL 248 (H) 06/02/2022   TRIG 46.0  06/02/2022   HDL 72.20 06/02/2022   LDLCALC 166 (H) 06/02/2022   ALT 16 06/02/2022   AST 23 06/02/2022   NA 136 06/02/2022   K 4.7 06/02/2022   CL 104 06/02/2022   CREATININE 1.01 06/02/2022   BUN 16 06/02/2022   CO2 27 06/02/2022   TSH 1.73 01/27/2022   HGBA1C 5.4 06/02/2022    US THYROID  Result Date: 06/02/2022 CLINICAL DATA:  Thyroid nodule follow-up EXAM: THYROID ULTRASOUND TECHNIQUE: Ultrasound examination of the thyroid gland and adjacent soft tissues was performed. COMPARISON:  06/03/2021 04/16/2020 05/16/2019 FINDINGS: Parenchymal Echotexture: Mildly heterogeneous Isthmus: 0.3 cm Right lobe: 4.0 x 0.8 x 0.9 cm Left lobe: 4.0 x 1.1 x 1.6 cm _________________________________________________________ Estimated total number of nodules >/= 1 cm: 1 Number of spongiform nodules >/=  2 cm not described below (TR1): 0 Number of mixed cystic and solid nodules >/= 1.5 cm not described below (TR2): 0 _________________________________________________________ Nodule 1: 0.6 x 0.5 x 0.4 cm isoechoic solid right inferior thyroid nodule is unchanged from prior examination and does not meet criteria for imaging surveillance or FNA. _________________________________________________________ Nodule # 2: Prior biopsy: No Location: Left; mid Maximum size: 2.1 cm; Other 2 dimensions: 1.2 x 1.2 cm, previously, 2.0 x 1.4 x 0.8 cm Composition: solid/almost completely solid (2) Echogenicity: isoechoic (1) Shape: not taller-than-wide (0) Margins: ill-defined (0) Echogenic foci: none (0) ACR TI-RADS total points: 3. ACR TI-RADS risk category:  TR3 (3 points). Significant change in size (>/= 20% in two dimensions and minimal increase of 2 mm): No Change in features: No Change in ACR TI-RADS risk category: No ACR TI-RADS recommendations: *Given size (>/= 1.5 - 2.4  cm) and appearance, a follow-up ultrasound in 1 year should be considered based on TI-RADS criteria. _________________________________________________________  IMPRESSION: Nodule 2 (TI-RADS 3), measuring 2.1 cm, located in the mid left thyroid lobe is not significantly changed in size since prior examination from 04/16/2020 and still meets criteria for imaging follow-up. Annual ultrasound surveillance is recommended until 5 years of stability is documented. The above is in keeping with the ACR TI-RADS recommendations - J Am Coll Radiol 2017;14:587-595. Electronically Signed   By: Miachel Roux M.D.   On: 06/02/2022 14:07       Assessment & Plan:  Pre-op evaluation Assessment & Plan: Planning - foot surgery.  Discussed surgery and risk of surgery with her.  She is currently doing well.  No chest pain or sob reported.  No increased cough or congestion.  Allergy symptoms controlled on current regimen.  Has mild sleep apnea.  History of dysphagia - symptoms have improved with weight loss.  Advised to discuss with anesthesia.  Given above, EKG obtained and revealed SR with no acute ischemic changes.  Given no acute symptoms and no EKG changes, feel she is at low risk from a cardiac standpoint to proceed with planned surgery.  Will need close intra op and post op monitoring of heart rate and blood pressure to avoid extremes.  Need to hold wegovy prior to surgery.    Orders: -     EKG 12-Lead  Allergic rhinitis, unspecified seasonality, unspecified trigger Assessment & Plan: Remains on xyzal, singulair and flonase/astelin.  Stable.  Continue.  Follow.     Anemia, unspecified type Assessment & Plan: History of IDA.  Has seen Dr Vicente Males previously.  Had EGD and colonoscopy.  Question of need for capsule study or any further GI testing for history of IDA.  Had f/u appt with Dr Vicente Males - 06/2021.  Felt no further w/up warranted.     Anxiety Assessment & Plan: Continue citalopram.  Follow.    Dysphagia, unspecified type Assessment & Plan: Overall appears to be stable.  Has seen GI. Discussed notifying anesthesia of history of previous swallowing issues.  Previous  barium swallow 12/2013 - small reducible hiatal hernia.  No evidence of esophagitis.  Swallowing stable.  Follow.    Essential hypertension, benign Assessment & Plan: On amlodipine and losartan.  Pressure as outlined.  Amlodipine -  27m q day. Follow pressures.  Follow metabolic panel.    Gastroesophageal reflux disease, unspecified whether esophagitis present Assessment & Plan: Appears to be stable - on prilosec.    Hypercholesterolemia Assessment & Plan: The 10-year ASCVD risk score (Arnett DK, et al., 2019) is: 3.8%   Values used to calculate the score:     Age: [age]years     Sex: Female     Is Non-Hispanic African American: Yes     Diabetic: No     Tobacco smoker: No     Systolic Blood Pressure: 1258mmHg     Is BP treated: Yes     HDL Cholesterol: 72.2 mg/dL     Total Cholesterol: 248 mg/dL  Low cholesterol diet and exercise.  Follow lipid panel.    Hyperglycemia Assessment & Plan: Low carb diet and exercise.  Follow met b and a1c.    Hypothyroidism, unspecified type Assessment & Plan: On thyroid replacement.  Follow tsh.    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     Mild sleep apnea Assessment & Plan:  Seeing pulmonary.  Home sleep test recently.  Plans f/u with pulmonary to discuss.     Multiple thyroid nodules Assessment & Plan: Seeing endocrinology.  Follow up ultrasound:  Left mid thyroid nodule meets criteria for continued surveillance, as designated by the newly established ACR TI-RADS criteria. Surveillance ultrasound study recommended to be performed annually up to 5 years.  TFTs wnl.  Recommended continued follow up.     Rheumatoid arthritis with positive rheumatoid factor, involving unspecified site [facility]) Assessment & Plan: Followed by rheumatology. On MTX.  Also receiving rinvoq.  Stable. Need to discuss with rheumatology regarding when to hold rinvoq.     Vitamin D  deficiency Assessment & Plan: Continue vitamin D supplements.       Einar Pheasant, MD

## 2022-06-16 NOTE — H&P (View-Only) (Signed)
 Subjective:    Patient ID: Tina Parker, female    DOB: 07/04/1970, 51 y.o.   MRN: [id number]  Patient here for  Chief Complaint  Patient presents with   Pre-op Exam    HPI Here for pre op evaluation.  Planning for foot surgery 06/27/22.  Reports she is doing well.  Increased foot pain.  Limiting activity.  No chest pain or sob.  No increased cough or congestion.  Allergy symptoms stable.  No acid reflux.  No nausea or vomiting.  No bowel change reported.     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   Pure hypercholesterolemia    Rheumatoid arthritis(714.0)    positive anti CCP antibodies, positive RF, oligo-articular, MTX   Sciatica    Past Surgical History:  Procedure Laterality Date   CARPAL TUNNEL RELEASE Right    CESAREAN SECTION  1996   COLONOSCOPY  03/14/2002, 08/16/2007, 03/07/2013   [facility]-father   COLONOSCOPY WITH PROPOFOL N/A 06/21/2018   Procedure: COLONOSCOPY WITH PROPOFOL;  Surgeon: Elliott, Robert T, MD;  Location: [facility];  Service: Endoscopy;  Laterality: N/A;   CRANIOTOMY Left 06/25/2017   Procedure: CRANIOTOMY TEMPORAL LEFT FOR TUMOR RESECTION;  Surgeon: Cabbell, Kyle, MD;  Location: [facility] OR;  Service: Neurosurgery;  Laterality: Left;   ESOPHAGOGASTRODUODENOSCOPY  03/14/2002, 03/07/2013   ESOPHAGOGASTRODUODENOSCOPY (EGD) WITH PROPOFOL N/A 06/21/2018   Procedure: ESOPHAGOGASTRODUODENOSCOPY (EGD) WITH PROPOFOL;  Surgeon: Elliott, Robert T, MD;  Location: [facility] END[facility]COPY;  Service: Endoscopy;  Laterality: N/A;   ESOPHAGOGASTRODUODENOSCOPY (EGD) WITH PROPOFOL N/A 07/15/2019   Procedure:  ESOPHAGOGASTRODUODENOSCOPY (EGD) WITH PROPOFOL;  Surgeon: Anna, Kiran, MD;  Location: [facility] END[facility]COPY;  Service: Gastroenterology;  Laterality: N/A;   Post Septoplasty and turbinate reduction  2007   TRIGGER FINGER RELEASE     Family History  Problem Relation Age of Onset   Cancer Mother        Breast Cancer   Breast cancer Mother 38   Cancer Father        Colon Cancer   Heart disease Father        Hx of MI   Hypertension Father    Diabetes Father    Hyperlipidemia Father    Cancer Maternal Grandmother        lung cancer   Cancer Maternal Uncle        esophageal   Social History   Socioeconomic History   Marital status: Married    Spouse name: Not on file   Number of children: 2   Years of education: Not on file   Highest education level: Not on file  Occupational History   Occupation: Hairstylist  Tobacco Use   Smoking status: Never   Smokeless tobacco: Never  Vaping Use   Vaping Use: Never used  Substance and Sexual Activity   Alcohol use: Yes    Comment: wine cooler 2 drinks 2x a week/ OCCASIONALLY   Drug use: No   Sexual activity: Not on file  Other Topics Concern   Not on file  Social   History Narrative   Hairdresser    Social Determinants of Health   Financial Resource Strain: Low Risk  (03/13/2021)   Overall Financial Resource Strain (CARDIA)    Difficulty of Paying Living Expenses: Not hard at all  Food Insecurity: No Food Insecurity (11/01/2020)   Hunger Vital Sign    Worried About Running Out of Food in the Last Year: Never true    Ran Out of Food in the Last Year: Never true  Transportation Needs: Not on file  Physical Activity: Insufficiently Active (11/01/2020)   Exercise Vital Sign    Days of Exercise per Week: 2 days    Minutes of Exercise per Session: 20 min  Stress: Not on file  Social Connections: Not on file     Review of Systems  Constitutional:  Negative for appetite change and unexpected weight change.  HENT:  Negative for sinus  pressure.        No increased congestion.    Respiratory:  Negative for cough, chest tightness and shortness of breath.   Cardiovascular:  Negative for chest pain, palpitations and leg swelling.  Gastrointestinal:  Negative for abdominal pain, diarrhea, nausea and vomiting.  Genitourinary:  Negative for difficulty urinating and dysuria.  Musculoskeletal:  Negative for joint swelling and myalgias.  Skin:  Negative for color change and rash.  Neurological:  Negative for dizziness and headaches.  Psychiatric/Behavioral:  Negative for agitation and dysphoric mood.        Objective:     BP 128/82   Pulse (!) 110   Temp 98.3 F (36.8 C)   Ht 5' 1" (1.549 m)   Wt 138 lb (62.6 kg)   SpO2 96%   BMI 26.07 kg/m  Wt Readings from Last 3 Encounters:  06/16/22 138 lb (62.6 kg)  05/19/22 136 lb 9.6 oz (62 kg)  03/17/22 136 lb 6.4 oz (61.9 kg)    Physical Exam Vitals reviewed.  Constitutional:      General: She is not in acute distress.    Appearance: Normal appearance.  HENT:     Head: Normocephalic and atraumatic.     Right Ear: External ear normal.     Left Ear: External ear normal.  Eyes:     General: No scleral icterus.       Right eye: No discharge.        Left eye: No discharge.     Conjunctiva/sclera: Conjunctivae normal.  Neck:     Thyroid: No thyromegaly.  Cardiovascular:     Rate and Rhythm: Normal rate and regular rhythm.  Pulmonary:     Effort: No respiratory distress.     Breath sounds: Normal breath sounds. No wheezing.  Abdominal:     General: Bowel sounds are normal.     Palpations: Abdomen is soft.     Tenderness: There is no abdominal tenderness.  Musculoskeletal:        General: No swelling or tenderness.     Cervical back: Neck supple. No tenderness.  Lymphadenopathy:     Cervical: No cervical adenopathy.  Skin:    Findings: No erythema or rash.  Neurological:     Mental Status: She is alert.  Psychiatric:        Mood and Affect: Mood normal.         Behavior: Behavior normal.      Outpatient Encounter Medications as of 06/16/2022  Medication Sig   amLODipine (NORVASC) 5 MG tablet Take 1 tablet (5 mg total) by mouth daily.     Azelastine-Fluticasone 137-50 MCG/ACT SUSP Place 1 spray into both nostrils 2 (two) times daily.   budesonide-formoterol (SYMBICORT) 80-4.5 MCG/ACT inhaler 1-2 puffs twice daily (rinse mouth after use)   cholecalciferol (VITAMIN D3) 25 MCG (1000 UNIT) tablet Take 1,000 Units by mouth daily.   citalopram (CELEXA) 40 MG tablet TAKE 1 TABLET(40 MG) BY MOUTH DAILY   desloratadine (CLARINEX) 5 MG tablet Take 5 mg by mouth daily.   folic acid (FOLVITE) 1 MG tablet Take 1 mg by mouth daily.   losartan (COZAAR) 100 MG tablet TAKE ONE TABLET BY MOUTH DAILY.   methotrexate (RHEUMATREX) 2.5 MG tablet Take 22.5 mg by mouth every Monday. Caution:Chemotherapy. Protect from light. Take 9 tabs po weekly with meals   neomycin-polymyxin-hydrocortisone (CORTISPORIN) 3.5-10000-1 OTIC suspension Apply 1-2 drops daily after soaking and cover with bandaid   omeprazole (PRILOSEC) 40 MG capsule Take 1 capsule (40 mg total) by mouth 2 (two) times daily.   RINVOQ 15 MG TB24 Take 1 tablet by mouth daily.   valACYclovir (VALTREX) 500 MG tablet TAKE 1 TABLET BY MOUTH TWICE DAILY FOR 3 DAYS. CONTINUE 1 BY MOUTH EVERY DAY   vitamin B-12 (CYANOCOBALAMIN) 100 MCG tablet Take 100 mcg by mouth daily.   Vitamin D, Ergocalciferol, (DRISDOL) 1.25 MG (50000 UNIT) CAPS capsule TAKE 1 CAPSULE BY MOUTH EVERY 7 DAYS   WEGOVY 1.7 MG/0.75ML SOAJ INJECT 1.7 MG INTO SKIN ONCE A WEEK FOR 28 DAYS   Facility-Administered Encounter Medications as of 06/16/2022  Medication   betamethasone acetate-betamethasone sodium phosphate (CELESTONE) injection 12 mg     Lab Results  Component Value Date   WBC 5.8 06/02/2022   HGB 10.8 (L) 06/02/2022   HCT 31.7 (L) 06/02/2022   PLT 359.0 06/02/2022   GLUCOSE 77 06/02/2022   CHOL 248 (H) 06/02/2022   TRIG 46.0  06/02/2022   HDL 72.20 06/02/2022   LDLCALC 166 (H) 06/02/2022   ALT 16 06/02/2022   AST 23 06/02/2022   NA 136 06/02/2022   K 4.7 06/02/2022   CL 104 06/02/2022   CREATININE 1.01 06/02/2022   BUN 16 06/02/2022   CO2 27 06/02/2022   TSH 1.73 01/27/2022   HGBA1C 5.4 06/02/2022    US THYROID  Result Date: 06/02/2022 CLINICAL DATA:  Thyroid nodule follow-up EXAM: THYROID ULTRASOUND TECHNIQUE: Ultrasound examination of the thyroid gland and adjacent soft tissues was performed. COMPARISON:  06/03/2021 04/16/2020 05/16/2019 FINDINGS: Parenchymal Echotexture: Mildly heterogeneous Isthmus: 0.3 cm Right lobe: 4.0 x 0.8 x 0.9 cm Left lobe: 4.0 x 1.1 x 1.6 cm _________________________________________________________ Estimated total number of nodules >/= 1 cm: 1 Number of spongiform nodules >/=  2 cm not described below (TR1): 0 Number of mixed cystic and solid nodules >/= 1.5 cm not described below (TR2): 0 _________________________________________________________ Nodule 1: 0.6 x 0.5 x 0.4 cm isoechoic solid right inferior thyroid nodule is unchanged from prior examination and does not meet criteria for imaging surveillance or FNA. _________________________________________________________ Nodule # 2: Prior biopsy: No Location: Left; mid Maximum size: 2.1 cm; Other 2 dimensions: 1.2 x 1.2 cm, previously, 2.0 x 1.4 x 0.8 cm Composition: solid/almost completely solid (2) Echogenicity: isoechoic (1) Shape: not taller-than-wide (0) Margins: ill-defined (0) Echogenic foci: none (0) ACR TI-RADS total points: 3. ACR TI-RADS risk category:  TR3 (3 points). Significant change in size (>/= 20% in two dimensions and minimal increase of 2 mm): No Change in features: No Change in ACR TI-RADS risk category: No ACR TI-RADS recommendations: *Given size (>/= 1.5 - 2.4  cm) and appearance, a follow-up ultrasound in 1 year should be considered based on TI-RADS criteria. _________________________________________________________  IMPRESSION: Nodule 2 (TI-RADS 3), measuring 2.1 cm, located in the mid left thyroid lobe is not significantly changed in size since prior examination from 04/16/2020 and still meets criteria for imaging follow-up. Annual ultrasound surveillance is recommended until 5 years of stability is documented. The above is in keeping with the ACR TI-RADS recommendations - J Am Coll Radiol 2017;14:587-595. Electronically Signed   By: Miachel Roux M.D.   On: 06/02/2022 14:07       Assessment & Plan:  Pre-op evaluation Assessment & Plan: Planning - foot surgery.  Discussed surgery and risk of surgery with her.  She is currently doing well.  No chest pain or sob reported.  No increased cough or congestion.  Allergy symptoms controlled on current regimen.  Has mild sleep apnea.  History of dysphagia - symptoms have improved with weight loss.  Advised to discuss with anesthesia.  Given above, EKG obtained and revealed SR with no acute ischemic changes.  Given no acute symptoms and no EKG changes, feel she is at low risk from a cardiac standpoint to proceed with planned surgery.  Will need close intra op and post op monitoring of heart rate and blood pressure to avoid extremes.  Need to hold wegovy prior to surgery.    Orders: -     EKG 12-Lead  Allergic rhinitis, unspecified seasonality, unspecified trigger Assessment & Plan: Remains on xyzal, singulair and flonase/astelin.  Stable.  Continue.  Follow.     Anemia, unspecified type Assessment & Plan: History of IDA.  Has seen Dr Vicente Males previously.  Had EGD and colonoscopy.  Question of need for capsule study or any further GI testing for history of IDA.  Had f/u appt with Dr Vicente Males - 06/2021.  Felt no further w/up warranted.     Anxiety Assessment & Plan: Continue citalopram.  Follow.    Dysphagia, unspecified type Assessment & Plan: Overall appears to be stable.  Has seen GI. Discussed notifying anesthesia of history of previous swallowing issues.  Previous  barium swallow 12/2013 - small reducible hiatal hernia.  No evidence of esophagitis.  Swallowing stable.  Follow.    Essential hypertension, benign Assessment & Plan: On amlodipine and losartan.  Pressure as outlined.  Amlodipine -  27m q day. Follow pressures.  Follow metabolic panel.    Gastroesophageal reflux disease, unspecified whether esophagitis present Assessment & Plan: Appears to be stable - on prilosec.    Hypercholesterolemia Assessment & Plan: The 10-year ASCVD risk score (Arnett DK, et al., 2019) is: 3.8%   Values used to calculate the score:     Age: [age]years     Sex: Female     Is Non-Hispanic African American: Yes     Diabetic: No     Tobacco smoker: No     Systolic Blood Pressure: 1258mmHg     Is BP treated: Yes     HDL Cholesterol: 72.2 mg/dL     Total Cholesterol: 248 mg/dL  Low cholesterol diet and exercise.  Follow lipid panel.    Hyperglycemia Assessment & Plan: Low carb diet and exercise.  Follow met b and a1c.    Hypothyroidism, unspecified type Assessment & Plan: On thyroid replacement.  Follow tsh.    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     Mild sleep apnea Assessment & Plan:  Seeing pulmonary.  Home sleep test recently.  Plans f/u with pulmonary to discuss.     Multiple thyroid nodules Assessment & Plan: Seeing endocrinology.  Follow up ultrasound:  Left mid thyroid nodule meets criteria for continued surveillance, as designated by the newly established ACR TI-RADS criteria. Surveillance ultrasound study recommended to be performed annually up to 5 years.  TFTs wnl.  Recommended continued follow up.     Rheumatoid arthritis with positive rheumatoid factor, involving unspecified site (HCC) Assessment & Plan: Followed by rheumatology. On MTX.  Also receiving rinvoq.  Stable. Need to discuss with rheumatology regarding when to hold rinvoq.     Vitamin D  deficiency Assessment & Plan: Continue vitamin D supplements.        , MD 

## 2022-06-24 ENCOUNTER — Encounter: Payer: Self-pay | Admitting: Internal Medicine

## 2022-06-24 NOTE — Assessment & Plan Note (Signed)
On thyroid replacement.  Follow tsh.  

## 2022-06-24 NOTE — Assessment & Plan Note (Signed)
Low carb diet and exercise.  Follow met b and a1c.  

## 2022-06-24 NOTE — Assessment & Plan Note (Signed)
Overall appears to be stable.  Has seen GI. Discussed notifying anesthesia of history of previous swallowing issues.  Previous barium swallow 12/2013 - small reducible hiatal hernia.  No evidence of esophagitis.  Swallowing stable.  Follow.

## 2022-06-24 NOTE — Assessment & Plan Note (Signed)
Seeing endocrinology.  Follow up ultrasound:  Left mid thyroid nodule meets criteria for continued surveillance, as designated by the newly established ACR TI-RADS criteria. Surveillance ultrasound study recommended to be performed annually up to 5 years.  TFTs wnl.  Recommended continued follow up.

## 2022-06-24 NOTE — Assessment & Plan Note (Signed)
Followed by rheumatology. On MTX.  Also receiving rinvoq.  Stable. Need to discuss with rheumatology regarding when to hold rinvoq.

## 2022-06-24 NOTE — Assessment & Plan Note (Signed)
Seeing pulmonary.  Home sleep test recently.  Plans f/u with pulmonary to discuss.

## 2022-06-24 NOTE — Assessment & Plan Note (Addendum)
The 10-year ASCVD risk score (Arnett DK, et al., 2019) is: 3.8%   Values used to calculate the score:     Age: 51 years     Sex: Female     Is Non-Hispanic African American: Yes     Diabetic: No     Tobacco smoker: No     Systolic Blood Pressure: 023 mmHg     Is BP treated: Yes     HDL Cholesterol: 72.2 mg/dL     Total Cholesterol: 248 mg/dL  Low cholesterol diet and exercise.  Follow lipid panel.

## 2022-06-24 NOTE — Assessment & Plan Note (Signed)
On amlodipine and losartan.  Pressure as outlined.  Amlodipine -  '5mg'$  q day. Follow pressures.  Follow metabolic panel.

## 2022-06-24 NOTE — Assessment & Plan Note (Signed)
Continue vitamin D supplements.  

## 2022-06-24 NOTE — Telephone Encounter (Signed)
Pt called stating the pre-op form has not been sent to [facility] yet and they need the form ASAP. Pt would like to be called

## 2022-06-24 NOTE — Assessment & Plan Note (Signed)
Followed by Dr Christella Noa - [facility].

## 2022-06-24 NOTE — Assessment & Plan Note (Signed)
History of IDA.  Has seen Dr Vicente Males previously.  Had EGD and colonoscopy.  Question of need for capsule study or any further GI testing for history of IDA.  Had f/u appt with Dr Vicente Males - 06/2021.  Felt no further w/up warranted.

## 2022-06-24 NOTE — Assessment & Plan Note (Signed)
Continue citalopram.   Follow.  

## 2022-06-24 NOTE — Assessment & Plan Note (Signed)
Remains on xyzal, singulair and flonase/astelin.  Stable.  Continue.  Follow.

## 2022-06-24 NOTE — Assessment & Plan Note (Signed)
Planning - foot surgery.  Discussed surgery and risk of surgery with her.  She is currently doing well.  No chest pain or sob reported.  No increased cough or congestion.  Allergy symptoms controlled on current regimen.  Has mild sleep apnea.  History of dysphagia - symptoms have improved with weight loss.  Advised to discuss with anesthesia.  Given above, EKG obtained and revealed SR with no acute ischemic changes.  Given no acute symptoms and no EKG changes, feel she is at low risk from a cardiac standpoint to proceed with planned surgery.  Will need close intra op and post op monitoring of heart rate and blood pressure to avoid extremes.  Need to hold wegovy prior to surgery.

## 2022-06-24 NOTE — Assessment & Plan Note (Signed)
Appears to be stable - on prilosec.

## 2022-06-26 ENCOUNTER — Encounter
Admission: RE | Admit: 2022-06-26 | Discharge: 2022-06-26 | Disposition: A | Discharge status: Home / Self Care | Payer: 59 | Source: Ambulatory Visit | Attending: Podiatry | Admitting: Podiatry

## 2022-06-26 ENCOUNTER — Other Ambulatory Visit: Payer: Self-pay

## 2022-06-26 DIAGNOSIS — Z01818 Encounter for other preprocedural examination: Secondary | ICD-10-CM

## 2022-06-26 HISTORY — DX: Anxiety disorder, unspecified: F41.9

## 2022-06-26 HISTORY — DX: Sleep apnea, unspecified: G47.30

## 2022-06-26 NOTE — Patient Instructions (Addendum)
Your procedure is scheduled on: 06/27/22 - Friday Report to the Registration Desk on the 1st floor of the [facility]. To find out your arrival time, please call [phone] between 1PM - 3PM on: 06/26/22 - Thursday If your arrival time is 6:00 am, do not arrive prior to that time as the [facility] entrance doors do not open until 6:00 am.  REMEMBER: Instructions that are not followed completely may result in serious medical risk, up to and including death; or upon the discretion of your surgeon and anesthesiologist your surgery may need to be rescheduled.  Do not eat food after midnight the night before surgery.  No gum chewing, lozengers or hard candies.  You may however, drink CLEAR liquids up to 2 hours before you are scheduled to arrive for your surgery. Do not drink anything within 2 hours of your scheduled arrival time.  Clear liquids include: - water  - apple juice without pulp - gatorade (not RED colors) - black coffee or tea (Do NOT add milk or creamers to the coffee or tea) Do NOT drink anything that is not on this list.  In addition, your doctor has ordered for you to drink the provided  Ensure Pre-Surgery Clear Carbohydrate Drink  Drinking this carbohydrate drink up to two hours before surgery helps to reduce insulin resistance and improve patient outcomes. Please complete drinking 2 hours prior to scheduled arrival time.  TAKE THESE MEDICATIONS THE MORNING OF SURGERY WITH A SIP OF WATER:  - amLODipine (NORVASC)  - citalopram (CELEXA)  - RINVOQ  - Pepcid take 1 at bedtime tonight and 1 in the am before you arrive for your procedure.  HOLD WEGOVY 7 days prior to your surgery.  One week prior to surgery: Stop Anti-inflammatories (NSAIDS) such as Advil, Aleve, Ibuprofen, Motrin, Naproxen, Naprosyn and Aspirin based products such as Excedrin, Goodys Powder, BC Powder.  Stop ANY OVER THE COUNTER supplements until after surgery.  You may however, continue to take  Tylenol if needed for pain up until the day of surgery.  No Alcohol for 24 hours before or after surgery.  No Smoking including e-cigarettes for 24 hours prior to surgery.  No chewable tobacco products for at least 6 hours prior to surgery.  No nicotine patches on the day of surgery.  Do not use any "recreational" drugs for at least a week prior to your surgery.  Please be advised that the combination of cocaine and anesthesia may have negative outcomes, up to and including death. If you test positive for cocaine, your surgery will be cancelled.  On the morning of surgery brush your teeth with toothpaste and water, you may rinse your mouth with mouthwash if you wish. Do not swallow any toothpaste or mouthwash.  Use CHG Soap or wipes as directed on instruction sheet.  Do not wear jewelry, make-up, hairpins, clips or nail polish.  Do not wear lotions, powders, or perfumes.   Do not shave body from the neck down 48 hours prior to surgery just in case you cut yourself which could leave a site for infection. Also, freshly shaved skin may become irritated if using the CHG soap.  Contact lenses, hearing aids and dentures may not be worn into surgery.  Do not bring valuables to the hospital. [facility] is not responsible for any missing/lost belongings or valuables.   Bring your C-PAP to the hospital with you in case you may have to spend the night.   Notify your doctor if there  is any change in your medical condition (cold, fever, infection).  Wear comfortable clothing (specific to your surgery type) to the hospital.  After surgery, you can help prevent lung complications by doing breathing exercises.  Take deep breaths and cough every 1-2 hours. Your doctor may order a device called an Incentive Spirometer to help you take deep breaths. When coughing or sneezing, hold a pillow firmly against your incision with both hands. This is called "splinting." Doing this helps protect your  incision. It also decreases belly discomfort.  If you are being admitted to the hospital overnight, leave your suitcase in the car. After surgery it may be brought to your room.  If you are being discharged the day of surgery, you will not be allowed to drive home. You will need a responsible adult (18 years or older) to drive you home and stay with you that night.   If you are taking public transportation, you will need to have a responsible adult (18 years or older) with you. Please confirm with your physician that it is acceptable to use public transportation.   Please call the [facility] at [phone] if you have any questions about these instructions.  Surgery Visitation Policy:  Patients undergoing a surgery or procedure may have two family members or support persons with them as long as the person is not COVID-19 positive or experiencing its symptoms.   Inpatient Visitation:    Visiting hours are 7 a.m. to 8 p.m. Up to four visitors are allowed at one time in a patient room. The visitors may rotate out with other people during the day. One designated support person (adult) may remain overnight.  Due to an increase in RSV and influenza rates and associated hospitalizations, children ages 69 and under will not be able to visit patients in [facility]. Masks continue to be strongly recommended.

## 2022-06-27 ENCOUNTER — Other Ambulatory Visit: Payer: Self-pay

## 2022-06-27 ENCOUNTER — Ambulatory Visit: Payer: 59

## 2022-06-27 ENCOUNTER — Encounter: Payer: Self-pay | Admitting: Internal Medicine

## 2022-06-27 ENCOUNTER — Ambulatory Visit: Payer: 59 | Admitting: Anesthesiology

## 2022-06-27 ENCOUNTER — Ambulatory Visit
Admission: RE | Admit: 2022-06-27 | Discharge: 2022-06-27 | Disposition: A | Discharge status: Home / Self Care | Payer: 59 | Attending: Podiatry | Admitting: Podiatry

## 2022-06-27 ENCOUNTER — Encounter
Admission: RE | Disposition: A | Discharge status: Home / Self Care | Payer: Self-pay | Source: Home / Self Care | Attending: Podiatry

## 2022-06-27 ENCOUNTER — Encounter: Payer: Self-pay | Admitting: Podiatry

## 2022-06-27 DIAGNOSIS — M2011 Hallux valgus (acquired), right foot: Secondary | ICD-10-CM

## 2022-06-27 DIAGNOSIS — F419 Anxiety disorder, unspecified: Secondary | ICD-10-CM | POA: Diagnosis not present

## 2022-06-27 DIAGNOSIS — I10 Essential (primary) hypertension: Secondary | ICD-10-CM | POA: Diagnosis not present

## 2022-06-27 DIAGNOSIS — M8970 Major osseous defect, unspecified site: Secondary | ICD-10-CM | POA: Diagnosis not present

## 2022-06-27 DIAGNOSIS — E559 Vitamin D deficiency, unspecified: Secondary | ICD-10-CM | POA: Insufficient documentation

## 2022-06-27 DIAGNOSIS — K449 Diaphragmatic hernia without obstruction or gangrene: Secondary | ICD-10-CM | POA: Insufficient documentation

## 2022-06-27 DIAGNOSIS — Z79899 Other long term (current) drug therapy: Secondary | ICD-10-CM | POA: Diagnosis not present

## 2022-06-27 DIAGNOSIS — E039 Hypothyroidism, unspecified: Secondary | ICD-10-CM | POA: Insufficient documentation

## 2022-06-27 DIAGNOSIS — K219 Gastro-esophageal reflux disease without esophagitis: Secondary | ICD-10-CM | POA: Diagnosis not present

## 2022-06-27 DIAGNOSIS — M059 Rheumatoid arthritis with rheumatoid factor, unspecified: Secondary | ICD-10-CM | POA: Diagnosis not present

## 2022-06-27 DIAGNOSIS — G4730 Sleep apnea, unspecified: Secondary | ICD-10-CM | POA: Insufficient documentation

## 2022-06-27 DIAGNOSIS — Z01818 Encounter for other preprocedural examination: Secondary | ICD-10-CM

## 2022-06-27 DIAGNOSIS — M19071 Primary osteoarthritis, right ankle and foot: Secondary | ICD-10-CM | POA: Insufficient documentation

## 2022-06-27 HISTORY — PX: ARTHRODESIS METATARSALPHALANGEAL JOINT (MTPJ): SHX6566

## 2022-06-27 HISTORY — PX: FOOT ARTHRODESIS: SHX1655

## 2022-06-27 LAB — POCT PREGNANCY, URINE: Preg Test, Ur: NEGATIVE

## 2022-06-27 SURGERY — FUSION, JOINT, GREAT TOE
Anesthesia: General | Site: Toe | Laterality: Right

## 2022-06-27 MED ORDER — MIDAZOLAM HCL 2 MG/2ML IJ SOLN
INTRAMUSCULAR | Status: AC
  Filled 2022-06-27: qty 2

## 2022-06-27 MED ORDER — FENTANYL CITRATE (PF) 100 MCG/2ML IJ SOLN: 25.0000 ug | INTRAMUSCULAR | Status: DC | PRN

## 2022-06-27 MED ORDER — CHLORHEXIDINE GLUCONATE 0.12 % MT SOLN: 15.0000 mL | Freq: Once | OROMUCOSAL | Status: AC

## 2022-06-27 MED ORDER — SODIUM CHLORIDE 0.9 % IR SOLN
Status: DC | PRN
  Administered 2022-06-27: 500 mL

## 2022-06-27 MED ORDER — ONDANSETRON HCL 4 MG/2ML IJ SOLN
INTRAMUSCULAR | Status: DC | PRN
  Administered 2022-06-27: 4 mg via INTRAVENOUS

## 2022-06-27 MED ORDER — IBUPROFEN 600 MG PO TABS: 600.0000 mg | ORAL_TABLET | Freq: Three times a day (TID) | ORAL | 0 refills | Status: AC | PRN

## 2022-06-27 MED ORDER — VANCOMYCIN HCL IN DEXTROSE 1-5 GM/200ML-% IV SOLN
INTRAVENOUS | Status: AC
  Administered 2022-06-27: 1000 mg via INTRAVENOUS
  Filled 2022-06-27: qty 200

## 2022-06-27 MED ORDER — DEXAMETHASONE SODIUM PHOSPHATE 10 MG/ML IJ SOLN
INTRAMUSCULAR | Status: DC | PRN
  Administered 2022-06-27: 10 mg via INTRAVENOUS

## 2022-06-27 MED ORDER — ASPIRIN 325 MG PO TBEC: 325.0000 mg | DELAYED_RELEASE_TABLET | Freq: Two times a day (BID) | ORAL | 0 refills | Status: AC

## 2022-06-27 MED ORDER — BUPIVACAINE LIPOSOME 1.3 % IJ SUSP
INTRAMUSCULAR | Status: AC
  Filled 2022-06-27: qty 20

## 2022-06-27 MED ORDER — MIDAZOLAM HCL 2 MG/2ML IJ SOLN
INTRAMUSCULAR | Status: DC | PRN
  Administered 2022-06-27: 2 mg via INTRAVENOUS

## 2022-06-27 MED ORDER — ORAL CARE MOUTH RINSE: 15.0000 mL | Freq: Once | OROMUCOSAL | Status: AC

## 2022-06-27 MED ORDER — DEXAMETHASONE SODIUM PHOSPHATE 10 MG/ML IJ SOLN
INTRAMUSCULAR | Status: AC
  Filled 2022-06-27: qty 1

## 2022-06-27 MED ORDER — LACTATED RINGERS IV SOLN: INTRAVENOUS | Status: DC

## 2022-06-27 MED ORDER — GABAPENTIN 300 MG PO CAPS: 300.0000 mg | ORAL_CAPSULE | Freq: Three times a day (TID) | ORAL | 0 refills | Status: DC

## 2022-06-27 MED ORDER — BUPIVACAINE HCL (PF) 0.5 % IJ SOLN
INTRAMUSCULAR | Status: AC
  Filled 2022-06-27: qty 30

## 2022-06-27 MED ORDER — HYDROCODONE-ACETAMINOPHEN 5-325 MG PO TABS: 2.0000 | ORAL_TABLET | ORAL | 0 refills | Status: AC | PRN

## 2022-06-27 MED ORDER — VANCOMYCIN HCL IN DEXTROSE 1-5 GM/200ML-% IV SOLN: 1000.0000 mg | INTRAVENOUS | Status: AC

## 2022-06-27 MED ORDER — ONDANSETRON HCL 4 MG/2ML IJ SOLN: 4.0000 mg | Freq: Once | INTRAMUSCULAR | Status: DC | PRN

## 2022-06-27 MED ORDER — CHLORHEXIDINE GLUCONATE 0.12 % MT SOLN
OROMUCOSAL | Status: AC
  Administered 2022-06-27: 15 mL via OROMUCOSAL
  Filled 2022-06-27: qty 15

## 2022-06-27 MED ORDER — BUPIVACAINE LIPOSOME 1.3 % IJ SUSP
INTRAMUSCULAR | Status: DC | PRN
  Administered 2022-06-27: 20 mL

## 2022-06-27 MED ORDER — HYDROCODONE-ACETAMINOPHEN 5-325 MG PO TABS
2.0000 | ORAL_TABLET | Freq: Four times a day (QID) | ORAL | Status: DC | PRN
  Administered 2022-06-27: 2 via ORAL

## 2022-06-27 MED ORDER — ONDANSETRON HCL 4 MG/2ML IJ SOLN
INTRAMUSCULAR | Status: AC
  Filled 2022-06-27: qty 2

## 2022-06-27 MED ORDER — GLYCOPYRROLATE 0.2 MG/ML IJ SOLN
INTRAMUSCULAR | Status: AC
  Filled 2022-06-27: qty 1

## 2022-06-27 MED ORDER — GLYCOPYRROLATE 0.2 MG/ML IJ SOLN
INTRAMUSCULAR | Status: DC | PRN
  Administered 2022-06-27: .2 mg via INTRAVENOUS

## 2022-06-27 MED ORDER — PHENYLEPHRINE HCL (PRESSORS) 10 MG/ML IV SOLN
INTRAVENOUS | Status: DC | PRN
  Administered 2022-06-27: 200 ug via INTRAVENOUS
  Administered 2022-06-27: 300 ug via INTRAVENOUS

## 2022-06-27 MED ORDER — EPHEDRINE SULFATE (PRESSORS) 50 MG/ML IJ SOLN
INTRAMUSCULAR | Status: DC | PRN
  Administered 2022-06-27: 10 mg via INTRAVENOUS
  Administered 2022-06-27: 15 mg via INTRAVENOUS

## 2022-06-27 MED ORDER — LIDOCAINE HCL (PF) 2 % IJ SOLN
INTRAMUSCULAR | Status: AC
  Filled 2022-06-27: qty 5

## 2022-06-27 MED ORDER — HYDROCODONE-ACETAMINOPHEN 5-325 MG PO TABS
ORAL_TABLET | ORAL | Status: AC
  Filled 2022-06-27: qty 2

## 2022-06-27 MED ORDER — PROPOFOL 10 MG/ML IV BOLUS
INTRAVENOUS | Status: DC | PRN
  Administered 2022-06-27: 150 mg via INTRAVENOUS
  Administered 2022-06-27: 50 mg via INTRAVENOUS
  Administered 2022-06-27: 30 ug/kg/min via INTRAVENOUS

## 2022-06-27 MED ORDER — FENTANYL CITRATE (PF) 100 MCG/2ML IJ SOLN
INTRAMUSCULAR | Status: DC | PRN
  Administered 2022-06-27 (×4): 25 ug via INTRAVENOUS

## 2022-06-27 MED ORDER — LIDOCAINE HCL (CARDIAC) PF 100 MG/5ML IV SOSY
PREFILLED_SYRINGE | INTRAVENOUS | Status: DC | PRN
  Administered 2022-06-27: 50 mg via INTRAVENOUS

## 2022-06-27 MED ORDER — PROPOFOL 10 MG/ML IV BOLUS
INTRAVENOUS | Status: AC
  Filled 2022-06-27: qty 20

## 2022-06-27 MED ORDER — PROPOFOL 1000 MG/100ML IV EMUL
INTRAVENOUS | Status: AC
  Filled 2022-06-27: qty 100

## 2022-06-27 MED ORDER — FENTANYL CITRATE (PF) 100 MCG/2ML IJ SOLN
INTRAMUSCULAR | Status: AC
  Filled 2022-06-27: qty 2

## 2022-06-27 SURGICAL SUPPLY — 78 items
APL PRP STRL LF DISP 70% ISPRP (MISCELLANEOUS) ×2
BASIN KIT SINGLE STR (MISCELLANEOUS) IMPLANT
BIT DRILL 2.2 CANN STRGHT (BIT) IMPLANT
BIT DRILL CANN F/COMP 2.2 (BIT) IMPLANT
BLADE OSC/SAGITTAL MD 5.5X18 (BLADE) IMPLANT
BLADE OSCILLATING/SAGITTAL (BLADE) ×2
BLADE SAW SAG MICRO THIN 65D (BLADE) ×2 IMPLANT
BLADE SURG 15 STRL LF DISP TIS (BLADE) ×4 IMPLANT
BLADE SURG 15 STRL SS (BLADE) ×4
BLADE SW THK.38XMED LNG THN (BLADE) IMPLANT
BNDG CMPR 5X4 CHSV STRCH STRL (GAUZE/BANDAGES/DRESSINGS) ×2
BNDG CMPR 5X6 CHSV STRCH STRL (GAUZE/BANDAGES/DRESSINGS) ×2
BNDG COHESIVE 4X5 TAN STRL LF (GAUZE/BANDAGES/DRESSINGS) ×2 IMPLANT
BNDG COHESIVE 6X5 TAN ST LF (GAUZE/BANDAGES/DRESSINGS) ×2 IMPLANT
BNDG ESMARK 4X12 TAN STRL LF (GAUZE/BANDAGES/DRESSINGS) IMPLANT
BNDG ESMARK 6X12 TAN STRL LF (GAUZE/BANDAGES/DRESSINGS) ×2 IMPLANT
BNDG GZE 12X3 1 PLY HI ABS (GAUZE/BANDAGES/DRESSINGS) ×2
BNDG STRETCH GAUZE 3IN X12FT (GAUZE/BANDAGES/DRESSINGS) ×2 IMPLANT
CHLORAPREP W/TINT 26 (MISCELLANEOUS) ×2 IMPLANT
COVER BACK TABLE 60X90IN (DRAPES) ×2 IMPLANT
CUFF TOURN SGL QUICK 18X4 (TOURNIQUET CUFF) ×2 IMPLANT
DRAPE FLUOR MINI C-ARM 54X84 (DRAPES) IMPLANT
DRAPE U-SHAPE 47X51 STRL (DRAPES) ×2 IMPLANT
DRSG MEPITEL 4X7.2 (GAUZE/BANDAGES/DRESSINGS) ×2 IMPLANT
ELECT REM PT RETURN 9FT ADLT (ELECTROSURGICAL) ×2
ELECTRODE REM PT RTRN 9FT ADLT (ELECTROSURGICAL) ×2 IMPLANT
GAUZE PAD ABD 8X10 STRL (GAUZE/BANDAGES/DRESSINGS) ×2 IMPLANT
GAUZE SPONGE 4X4 12PLY STRL (GAUZE/BANDAGES/DRESSINGS) ×2 IMPLANT
GLOVE BIO SURGEON STRL SZ7.5 (GLOVE) ×2 IMPLANT
GLOVE BIOGEL M 7.0 STRL (GLOVE) ×2 IMPLANT
GLOVE BIOGEL PI IND STRL 7.5 (GLOVE) ×2 IMPLANT
GLOVE BIOGEL PI IND STRL 8 (GLOVE) ×2 IMPLANT
GOWN STRL REUS W/ TWL LRG LVL3 (GOWN DISPOSABLE) ×2 IMPLANT
GOWN STRL REUS W/ TWL XL LVL3 (GOWN DISPOSABLE) ×2 IMPLANT
GOWN STRL REUS W/TWL LRG LVL3 (GOWN DISPOSABLE) ×2
GOWN STRL REUS W/TWL XL LVL3 (GOWN DISPOSABLE) ×2
GUIDEWIRE .045XTROC TIP LSR LN (WIRE) IMPLANT
GUIDEWIRE 1.6 (WIRE) ×2
GUIDEWIRE ORTH 157X1.6XTROC (WIRE) IMPLANT
HARVESTER BONE GRAFT 6 (INSTRUMENTS) IMPLANT
K-WIRE .045 CH (WIRE)
K-WIRE .062 (WIRE)
K-WIRE 1.1 (WIRE) ×2
K-WIRE FX6X.062X2 END TROC (WIRE)
KIT TURNOVER KIT A (KITS) ×2 IMPLANT
KWIRE .045 CH (WIRE) IMPLANT
KWIRE FX6X.062X2 END TROC (WIRE) IMPLANT
PACK EXTREMITY ARMC (MISCELLANEOUS) ×2 IMPLANT
PAD CAST 4YDX4 CTTN HI CHSV (CAST SUPPLIES) ×2 IMPLANT
PADDING CAST COTTON 4X4 STRL (CAST SUPPLIES) ×2
PADDING CAST COTTON 6X4 STRL (CAST SUPPLIES) ×2 IMPLANT
PENCIL SMOKE EVACUATOR (MISCELLANEOUS) IMPLANT
PIN BB TAK MTP SU (PIN) IMPLANT
PLATE PETITE 5D ANG RT (Plate) IMPLANT
REAMER METATARSAL 18MM (MISCELLANEOUS) IMPLANT
REAMER PHALANGEAL 18MM (MISCELLANEOUS) IMPLANT
SCREW CORTICAL 3.0X20 (Screw) IMPLANT
SCREW HEADLESS 3.5X26 (Screw) IMPLANT
SCREW LOCK 18X3XVALOPRFL (Screw) IMPLANT
SCREW LOCK TI QF 3X14 (Screw) IMPLANT
SCREW LOCK TI QF 3X20 (Screw) IMPLANT
SCREW LOCKING 3.0X18 (Screw) ×2 IMPLANT
SCREW LOCKING VARIABLE 3.0X16 (Screw) IMPLANT
SLEEVE SCD COMPRESS KNEE MED (STOCKING) ×2 IMPLANT
STOCKINETTE ORTHO 6X25 (MISCELLANEOUS) ×2 IMPLANT
STRIP CLOSURE SKIN 1/4X4 (GAUZE/BANDAGES/DRESSINGS) IMPLANT
SUCTION FRAZIER HANDLE 10FR (MISCELLANEOUS) ×2
SUCTION TUBE FRAZIER 10FR DISP (MISCELLANEOUS) ×2 IMPLANT
SUT DVC V-LOC 4-0 90 CLR P-12 (SUTURE) ×4
SUT ETHILON 3 0 PS 1 (SUTURE) ×2 IMPLANT
SUT MNCRL AB 3-0 PS2 27 (SUTURE) ×2 IMPLANT
SUT MNCRL AB 4-0 PS2 18 (SUTURE) ×2 IMPLANT
SUT VIC AB 2-0 SH 27 (SUTURE) ×2
SUT VIC AB 2-0 SH 27XBRD (SUTURE) ×2 IMPLANT
SUTURE DVC V-LC4-0 90 CLR P-12 (SUTURE) IMPLANT
SYR CONTROL 10ML LL (SYRINGE) ×2 IMPLANT
TUBING CONNECTING 10 (TUBING) ×2 IMPLANT
UNDERPAD 30X36 HEAVY ABSORB (UNDERPADS AND DIAPERS) ×2 IMPLANT

## 2022-06-27 NOTE — Brief Op Note (Signed)
06/27/2022  12:39 PM  PATIENT:  Tina Parker  51 y.o. female  PRE-OPERATIVE DIAGNOSIS:  HALLUX VALGUS, ARTHRITIS  POST-OPERATIVE DIAGNOSIS:  HALLUX VALGUS, ARTHRITIS  PROCEDURE:  Procedure(s): ARTHRODESIS METATARSALPHALANGEAL JOINT (MTPJ) (Right) ARTHRODESIS FOOT (Right)  SURGEON:  Surgeon(s) and Role:    * Chavela Justiniano, Stephan Minister, DPM - Primary    ASSISTANTS: none   ANESTHESIA:   local and general  EBL:  5cc   BLOOD ADMINISTERED:none  DRAINS: none   LOCAL MEDICATIONS USED: 16cc exparel Mayo and field block  SPECIMEN:  No Specimen  DISPOSITION OF SPECIMEN:  N/A  COUNTS:  YES  TOURNIQUET:   Total Tourniquet Time Documented: Calf (Right) - 58 minutes Total: Calf (Right) - 58 minutes   DICTATION: .Note written in EPIC  PLAN OF CARE: Discharge to home after PACU  PATIENT DISPOSITION:  PACU - hemodynamically stable.   Delay start of Pharmacological VTE agent (>24hrs) due to surgical blood loss or risk of bleeding: no  - [facility] LLE - Begin pain meds at home - F/U next week

## 2022-06-27 NOTE — Transfer of Care (Signed)
Immediate Anesthesia Transfer of Care Note  Patient: [place]  Procedure(s) Performed: ARTHRODESIS METATARSALPHALANGEAL JOINT (MTPJ) (Right: Toe) ARTHRODESIS FOOT (Right: Foot)  Patient Location: PACU  Anesthesia Type:General  Level of Consciousness: awake, alert , oriented, and patient cooperative  Airway & Oxygen Therapy: Patient Spontanous Breathing and Patient connected to nasal cannula oxygen  Post-op Assessment: Report given to RN, Post -op Vital signs reviewed and stable, and Patient moving all extremities  Post vital signs: Reviewed and stable  Last Vitals:  Vitals Value Taken Time  BP 113/68 06/27/22 1247  Temp 36.8 C 06/27/22 1245  Pulse 121 06/27/22 1253  Resp 16 06/27/22 1253  SpO2 100 % 06/27/22 1253  Vitals shown include unvalidated device data.  Last Pain:  Vitals:   06/27/22 1245  TempSrc:   PainSc: 0-No pain         Complications: No notable events documented.

## 2022-06-27 NOTE — Op Note (Signed)
Patient Name: Tina Parker DOB: [birth year]  MRN: [id number]   Date of Service: 06/27/2022  Surgeon: Dr. Lanae Crumbly, DPM Assistants: None Pre-operative Diagnosis:  Hallux valgus right foot  Osteoarthritis metatarsophalangeal joint right foot Post-operative Diagnosis:  Hallux valgus right foot  Osteoarthritis metatarsophalangeal joint right foot Procedures:  1) arthrodesis metatarsophalangeal joint right foot  2) bone graft minor from heel Pathology/Specimens: * No specimens in log * Anesthesia: General with local Hemostasis:  Total Tourniquet Time Documented: Calf (Right) - 58 minutes Total: Calf (Right) - 58 minutes  Estimated Blood Loss: 2 mL Materials:  Implant Name Type Inv. Item Serial No. Manufacturer Lot No. LRB No. Used Action  PETITE MTP PLATE      Right 1 Implanted  3.0 X 18 LOCKING      Right 1 Implanted  SCREW LOCKING VARIABLE 3.0X16 - [id number] Screw SCREW LOCKING VARIABLE 3.0X16  [employer]  Right 2 Implanted  3.0 X 14 LOCKING      Right 1 Implanted  3.0 X 20 CORTICAL      Right 1 Implanted  3.0 X 20 LOCKING      Right 1 Implanted  3.5 X 26 HEADLESS SCREW      Right 1 Implanted   Medications: 16 cc Exparel field and Mayo block Complications: No complication noted  Indications for Procedure:  This is a 51 y.o. female with a history of hallux valgus deformity and osteoarthritis of the first metatarsophalangeal joint.  Arthrodesis was recommended. All risks, benefits and potential complications discussed prior to the procedure. All questions addressed. Informed consent signed and reviewed.      Procedure in Detail: Patient was identified in pre-operative holding area. Formal consent was signed and the right lower extremity was marked. Patient was brought back to the operating room. Anesthesia was induced. The extremity was prepped and draped in the usual sterile fashion. Timeout was taken to confirm patient name, laterality, and procedure prior to  incision.   Attention was then directed to the right foot where a longitudinal incision was made just medial to the extensor hallucis longus tendon.  This carried through subcutaneous tissues all bleeders were cauterized as necessary.  Neurovascular bundles and extensor tendon were safely retracted and dissection was carried down to the capsule of the first metatarsal phalangeal joint which was incised and a McGlamry elevator was used to expose the metatarsal head and base of the proximal phalanx.  Soft tissue attachments were freed from the base of the phalanx.  Osteoarthritis of both surfaces was noted involving about 25% of the surface.  A guidewire for the reamer was placed over the metatarsal head and the reamer was used to resect the articular cartilage and some of the subchondral bone plate.  The same was done with the cone reamer using a guidewire on the base of the proximal phalanx.  The prominent medial eminence of the first metatarsal head was then resected using a sagittal saw.  All bony and soft tissue debris was then thoroughly irrigated from the wound.  A 2.5 mm drill bit was used to fenestrate the head of the first metatarsal and base of the proximal phalanx.  I then made an incision of the lateral heel and blunt dissection was used to expose the lateral wall the calcaneus.  A bone graft reamer was used to obtain 1 to 2 cc of autogenous cancellous bone graft.  This was placed into the first metatarsal phalangeal joint arthrodesis site.  The hallux was then placed into  a corrected position using fluoroscopic guidance.  Once satisfactory position had been achieved in the transverse plane it was slightly dorsiflexed using a plate to simulate weightbearing and a folded 4 x 4 sponge.  A guidewire for the 3.5 millimeter screw was then placed.  I then directed my attention dorsally and selected a plate and put it in temporary position using plate tacks.  Locking screws were placed in the distal holes with  good stability of the construct.  Once satisfactory position was achieved locking and nonlocking screws were placed in the proximal screw holes, the compression mechanism as well as the non locking screw was used in the compression slot to achieve further compression.  The 3.5 mm cannulated fully threaded compression screw was then placed with good compression and stability across the fusion site.  Final films were taken and the incision was thoroughly irrigated, closure was initiated in layers using 3-0 Monocryl and 4-0 V-loc.  The foot was then dressed with Steri-Strips Xeroform and dry sterile dressings and an Ace wrap. Patient tolerated the procedure well.   Disposition: Following a period of post-operative monitoring, patient will be transferred to home.

## 2022-06-27 NOTE — Interval H&P Note (Signed)
History and Physical Interval Note:  06/27/2022 10:54 AM  [place]  has presented today for surgery, with the diagnosis of HALLUX VALGUS, ARTHRITIS.  The various methods of treatment have been discussed with the patient and family. After consideration of risks, benefits and other options for treatment, the patient has consented to  Procedure(s): ARTHRODESIS METATARSALPHALANGEAL JOINT (MTPJ) (Right) ARTHRODESIS FOOT (Right) as a surgical intervention.  The patient's history has been reviewed, patient examined, no change in status, stable for surgery.  I have reviewed the patient's chart and labs.  Questions were answered to the patient's satisfaction.     Criselda Peaches

## 2022-06-27 NOTE — Discharge Instructions (Addendum)
Post-Surgery Instructions  1. If you are recuperating from surgery anywhere other than home, please be sure to leave [place] a number where you can be reached. 2. Go directly home and rest. 3. The keep operated foot (or feet) elevated six inches above the hip when sitting or lying down. 4. Support the elevated foot and leg with pillows under the calf. DO NOT PLACE PILLOWS UNDER THE KNEE. 5. DO NOT REMOVE or get your bandages wet. This will increase your chances of getting an infection. 6. Wear your surgical shoe at all times when you are up. 7. A limited amount of pain and swelling may occur. The skin may take on a bruised appearance. This is no cause for alarm. 8. For slight pain and swelling, apply an ice pack directly over the bandage for 15 minutes every hour. Continue icing until seen in the office. DO NOT apply any form of heat to the area. 9. Have prescription(s) filled immediately and take as directed. 10. Drink lots of liquids, water, and juice. 11. CALL THE OFFICE IMMEDIATELY IF: a. Bleeding continues b. Pain increases and/or does not respond to medication c. Bandage or cast appears too tight d. Any liquids (water, coffee, etc.) have spilled on your bandages. e. Tripping, falling, or stubbing the surgical foot f. If your temperature rises above 101 g. If you have ANY questions at all 12. Please use the crutches, knee scooter, or walker you have prescribed, rented, or purchased. If you are non-weight bearing DO NOT put weight on the operated foot   If you are weight-bearing, follow your physician's instructions. You are expected to be:  non-weight bearing 13. Special Instructions: You may undo the straps or remove the boot when resting. Boot must be on while you are up and moving  14. Your next appointment is: 07/02/2022 1:15 PM   If you need to reach the nurse for any reason, please call: [place]: [phone] [place]: [phone] [place]: [phone])  [phone]   AMBULATORY SURGERY  DISCHARGE INSTRUCTIONS   The drugs that you were given will stay in your system until tomorrow so for the next 24 hours you should not:  Drive an automobile Make any legal decisions Drink any alcoholic beverage   You may resume regular meals tomorrow.  Today it is better to start with liquids and gradually work up to solid foods.  You may eat anything you prefer, but it is better to start with liquids, then soup and crackers, and gradually work up to solid foods.   Please notify your doctor immediately if you have any unusual bleeding, trouble breathing, redness and pain at the surgery site, drainage, fever, or pain not relieved by medication.    Additional Instructions:  LEAVE GREEN ARM BAND ON 4 DAYS        Please contact your physician with any problems or Same Day Surgery at [phone], Monday through Friday 6 am to 4 pm, or [place] at [employer] number at [phone].

## 2022-06-27 NOTE — H&P (Signed)
History and Physical Interval Note:  06/27/2022 10:49 AM  [place]  has presented today for surgery, with the diagnosis of right foot bunion.  The various methods of treatment have been discussed with the patient and family. After consideration of risks, benefits and other options for treatment, the patient has consented to   Procedure(s): ARTHRODESIS METATARSALPHALANGEAL JOINT (MTPJ) (Right) ARTHRODESIS FOOT (Right) as a surgical intervention.  The patient's history has been reviewed, patient examined, no change in status, stable for surgery.  I have reviewed the patient's chart and labs.  Questions were answered to the patient's satisfaction.     Criselda Peaches

## 2022-06-27 NOTE — Anesthesia Procedure Notes (Signed)
Procedure Name: LMA Insertion Date/Time: 06/27/2022 11:25 AM  Performed by: Candice Camp, CRNAPre-anesthesia Checklist: Timeout performed, Patient being monitored, Suction available, Emergency Drugs available and Patient identified Patient Re-evaluated:Patient Re-evaluated prior to induction Oxygen Delivery Method: Circle system utilized Preoxygenation: Pre-oxygenation with 100% oxygen Induction Type: IV induction Ventilation: Mask ventilation without difficulty LMA: LMA inserted LMA Size: 3.0 Number of attempts: 1 Tube secured with: Tape Dental Injury: Teeth and Oropharynx as per pre-operative assessment

## 2022-06-27 NOTE — Progress Notes (Signed)
Crutch teaching done with pt.  Crutch fitting done.  Pt demonstrated and verbalized understanding of all teaching.

## 2022-06-27 NOTE — Anesthesia Preprocedure Evaluation (Addendum)
Anesthesia Evaluation  Patient identified by MRN, date of birth, ID band Patient awake    Reviewed: Allergy & Precautions, H&P , NPO status , Patient's Chart, lab work & pertinent test results, reviewed documented beta blocker date and time   Airway Mallampati: II  TM Distance: >3 FB Neck ROM: full    Dental  (+) Teeth Intact   Pulmonary sleep apnea    Pulmonary exam normal        Cardiovascular Exercise Tolerance: Good hypertension, Pt. on medications and On Medications negative cardio ROS Normal cardiovascular exam Rate:Normal     Neuro/Psych  Headaches  Anxiety      Neuromuscular disease  negative psych ROS   GI/Hepatic Neg liver ROS, hiatal hernia,GERD  Medicated,,  Endo/Other  Hypothyroidism    Renal/GU negative Renal ROS  negative genitourinary   Musculoskeletal   Abdominal   Peds  Hematology  (+) Blood dyscrasia, anemia   Anesthesia Other Findings   Reproductive/Obstetrics negative OB ROS                             Anesthesia Physical Anesthesia Plan  ASA: 3  Anesthesia Plan: General LMA   Post-op Pain Management:    Induction:   PONV Risk Score and Plan:   Airway Management Planned:   Additional Equipment:   Intra-op Plan:   Post-operative Plan:   Informed Consent: I have reviewed the patients History and Physical, chart, labs and discussed the procedure including the risks, benefits and alternatives for the proposed anesthesia with the patient or authorized representative who has indicated his/her understanding and acceptance.       Plan Discussed with: CRNA  Anesthesia Plan Comments:        Anesthesia Quick Evaluation

## 2022-07-01 ENCOUNTER — Other Ambulatory Visit: Payer: Self-pay | Admitting: Podiatry

## 2022-07-01 ENCOUNTER — Encounter: Payer: Self-pay | Admitting: Podiatry

## 2022-07-01 NOTE — Anesthesia Postprocedure Evaluation (Signed)
Anesthesia Post Note  Patient: Tina Parker  Procedure(s) Performed: ARTHRODESIS METATARSALPHALANGEAL JOINT (MTPJ) (Right: Toe) ARTHRODESIS FOOT (Right: Foot)  Patient location during evaluation: PACU Anesthesia Type: General Level of consciousness: awake and alert Pain management: pain level controlled Vital Signs Assessment: post-procedure vital signs reviewed and stable Respiratory status: spontaneous breathing, nonlabored ventilation, respiratory function stable and patient connected to nasal cannula oxygen Cardiovascular status: blood pressure returned to baseline and stable Postop Assessment: no apparent nausea or vomiting Anesthetic complications: no   No notable events documented.   Last Vitals:  Vitals:   06/27/22 1324 06/27/22 1355  BP: 118/74 119/76  Pulse: (!) 109 (!) 101  Resp: 19 16  Temp: 37.1 C   SpO2: 98% 100%    Last Pain:  Vitals:   06/30/22 1026  TempSrc:   PainSc: [name]

## 2022-07-02 ENCOUNTER — Ambulatory Visit (INDEPENDENT_AMBULATORY_CARE_PROVIDER_SITE_OTHER): Payer: 59

## 2022-07-02 ENCOUNTER — Ambulatory Visit (INDEPENDENT_AMBULATORY_CARE_PROVIDER_SITE_OTHER): Payer: 59 | Admitting: Podiatry

## 2022-07-02 DIAGNOSIS — M21611 Bunion of right foot: Secondary | ICD-10-CM | POA: Diagnosis not present

## 2022-07-02 DIAGNOSIS — M2011 Hallux valgus (acquired), right foot: Secondary | ICD-10-CM

## 2022-07-02 NOTE — Patient Instructions (Signed)
On 1/15 (Monday) you can start putting weight on the foot in the boot gradually

## 2022-07-02 NOTE — Progress Notes (Signed)
  Subjective:  Patient ID: Tina Parker, female    DOB: Oct 22, 1970,  MRN: [id number]  Chief Complaint  Patient presents with   Routine Post Op    POV #1 DOS 06/27/2022 FUSION OF RIGHT GREAT TOE JOINT W/BONE GRAFT FROM HEEL      52 y.o. female returns for post-op check.  She is doing well not having much pain denies fever chills nausea vomiting chest pain or shortness of breath  Review of Systems: Negative except as noted in the HPI. Denies N/V/F/Ch.   Objective:  There were no vitals filed for this visit. There is no height or weight on file to calculate BMI. Constitutional Well developed. Well nourished.  Vascular Foot warm and well perfused. Capillary refill normal to all digits.  Calf is soft and supple, no posterior calf or knee pain, negative Homans' sign  Neurologic Normal speech. Oriented to person, place, and time. Epicritic sensation to light touch grossly present bilaterally.  Dermatologic Skin healing well without signs of infection. Skin edges well coapted without signs of infection.  Orthopedic: Mild edema.  Tenderness to palpation noted about the surgical site.   Multiple view plain film radiographs: Good correction hallux valgus deformity noted all hardware intact and equivalent to immediate postoperative films no complication noted Assessment:   1. Hallux valgus with bunions, right    Plan:  Patient was evaluated and treated and all questions answered.  S/p foot surgery right -Progressing as expected post-operatively. -XR: Noted above -WB Status: She may begin WBAT in cam walker boot in 10 days -Sutures: Plan remove at next visit. -Medications: No refills required is not having as much pain now, she will let me know if she needs a refill of her [place] redressed.  Return in about 19 days (around 07/21/2022) for post op (no x-rays), suture removal.

## 2022-07-03 ENCOUNTER — Encounter: Payer: Self-pay | Admitting: Podiatry

## 2022-07-08 ENCOUNTER — Telehealth (INDEPENDENT_AMBULATORY_CARE_PROVIDER_SITE_OTHER): Payer: 59 | Admitting: Primary Care

## 2022-07-08 ENCOUNTER — Encounter: Payer: Self-pay | Admitting: Primary Care

## 2022-07-08 DIAGNOSIS — G4733 Obstructive sleep apnea (adult) (pediatric): Secondary | ICD-10-CM | POA: Diagnosis not present

## 2022-07-08 NOTE — Progress Notes (Signed)
Virtual Visit via Video Note  I connected with [place] on 07/08/22 at 11:30 AM EST by a video enabled telemedicine application and verified that I am speaking with the correct person using two identifiers.  Location: Patient: Home Provider: Office    I discussed the limitations of evaluation and management by telemedicine and the availability of in person appointments. The patient expressed understanding and agreed to proceed.  History of Present Illness: 52 year old female. PMH significant for mild sleep apnea, allergic rhinitis, sinusitis, HTN, dysphagia, hypothyroidism. Patient is followed by Dr. Halford Chessman for sleep apanea and recent saw Dr. Carlis Abbott for cough. HST in 2018 showed AHI 3.5 (weight 160lbs).     Previous [facility] pulmonary encounter: 08/08/2019 Patient contacted today for virtual video visit to go over home sleep test results.  HST on 07/25/2019 showed an average AHI 6.2/hour with minimum SpO2 of 88%.  Discussed treatment options for mild obstructive sleep apnea including weight loss, CPAP, oral appliance and ENT/surgical evaluation. Her sleep is disruptive enough that she would like to try CPAP therapy at this time while also working to lose weight.    03/17/2022 Patient presents today for OSA follow-up/ on CPAP.  Started on CPAP back in February 2021 for symptomatic mild sleep apnea. She has lost 40-50 lbs in the last 2 years. She has been having issues with mask fit and airleaks. She has snoring symptoms if not wearing CPAP. Interested in possibly seeing ENT for snoring if still has sleep apnea.   Airview download 02/12/22-03/13/22 Usage 13/30 days used; 20% > 4 hours Average usage 1 hour 35 mins Pressure 5-15cm h20 (13.7cm h20-95%) Airleaks 1.1L/min (95%) AHI 3.6   07/08/2022 - Interim hx  Patient presents today for OSA follow-up. She has history sleep apnea, lost 40-50 lbs in the past two years. Repeat sleep study showed patient still has OSA despite weight loss. Her average AHI  was 13.8/hour with spO2 low 87%. She still has a working CPAP and will resume use. Current pressure settings 5-15cm h20. She needs CPAP new mask.   Observations/Objective:  - Appear well; No overt shortness of breath, wheezing or cough  Assessment and Plan:  Mild OSA: - Hx sleep apnea, lost 40-50lbs. Repeat sleep study showed no improvement in OSA  - Repeat HST >> AHI 13.8/hour  - Recommend patient continue to wear auto CPAP nightly 4-6 hours or longer - DME order placed for mask fitting with [employer]   Follow Up Instructions:  - 1 year or sooner if needed    I discussed the assessment and treatment plan with the patient. The patient was provided an opportunity to ask questions and all were answered. The patient agreed with the plan and demonstrated an understanding of the instructions.   The patient was advised to call back or seek an in-person evaluation if the symptoms worsen or if the condition fails to improve as anticipated.  I provided 22 minutes of non-face-to-face time during this encounter.   Martyn Ehrich, NP

## 2022-07-08 NOTE — Patient Instructions (Signed)
Resume CPAP FU in 1 year or sooner if needed

## 2022-07-11 ENCOUNTER — Other Ambulatory Visit: Payer: Self-pay | Admitting: Internal Medicine

## 2022-07-16 ENCOUNTER — Encounter: Payer: 59 | Admitting: Podiatry

## 2022-07-17 ENCOUNTER — Other Ambulatory Visit: Payer: Self-pay | Admitting: Internal Medicine

## 2022-07-21 ENCOUNTER — Ambulatory Visit (INDEPENDENT_AMBULATORY_CARE_PROVIDER_SITE_OTHER): Payer: 59 | Admitting: Podiatry

## 2022-07-21 DIAGNOSIS — M2011 Hallux valgus (acquired), right foot: Secondary | ICD-10-CM

## 2022-07-21 DIAGNOSIS — M21611 Bunion of right foot: Secondary | ICD-10-CM

## 2022-07-21 NOTE — Progress Notes (Signed)
  Subjective:  Patient ID: Tina Parker, female    DOB: [birth year],  MRN: [id number]  Chief Complaint  Patient presents with   Routine Post Op    POV #2 DOS 06/27/2022 FUSION OF RIGHT GREAT TOE JOINT W/BONE GRAFT FROM HEEL / SUTURE REMOVAL      52 y.o. female returns for post-op check.  She is doing well notes discomfort and swelling towards the end of the day  Review of Systems: Negative except as noted in the HPI. Denies N/V/F/Ch.   Objective:  There were no vitals filed for this visit. There is no height or weight on file to calculate BMI. Constitutional Well developed. Well nourished.  Vascular Foot warm and well perfused. Capillary refill normal to all digits.  Calf is soft and supple, no posterior calf or knee pain, negative Homans' sign  Neurologic Normal speech. Oriented to person, place, and time. Epicritic sensation to light touch grossly present bilaterally.  Dermatologic Incision well healed not hypertrophic  Orthopedic: Minimal edema.  Tenderness to palpation noted about the surgical site.   Multiple view plain film radiographs: Good correction hallux valgus deformity noted all hardware intact and equivalent to immediate postoperative films no complication noted Assessment:   1. Hallux valgus with bunions, right    Plan:  Patient was evaluated and treated and all questions answered.  S/p foot surgery right -Doing well orienting sutures were removed today.  She may resume regular bathing and showering can apply lotion and/or Neosporin to incision site.  Compression sleeve dispensed and she will wear this.  She may return to work with use of the knee scooter, discussed with her I do not want her standing on her feet all day at work.  May be WBAT otherwise and CAM boot.  Return in 2 weeks for radiographs transition to surgical shoe at that point so she can begin driving  Return in about 2 weeks (around 08/04/2022) for post op (new x-rays).

## 2022-08-04 ENCOUNTER — Ambulatory Visit (INDEPENDENT_AMBULATORY_CARE_PROVIDER_SITE_OTHER): Payer: 59 | Admitting: Podiatry

## 2022-08-04 ENCOUNTER — Ambulatory Visit (INDEPENDENT_AMBULATORY_CARE_PROVIDER_SITE_OTHER): Payer: 59

## 2022-08-04 ENCOUNTER — Encounter: Payer: Self-pay | Admitting: Podiatry

## 2022-08-04 VITALS — BP 141/88 | HR 93

## 2022-08-04 DIAGNOSIS — M21611 Bunion of right foot: Secondary | ICD-10-CM

## 2022-08-04 DIAGNOSIS — M2011 Hallux valgus (acquired), right foot: Secondary | ICD-10-CM | POA: Diagnosis not present

## 2022-08-04 DIAGNOSIS — Z9889 Other specified postprocedural states: Secondary | ICD-10-CM

## 2022-08-04 NOTE — Progress Notes (Signed)
  Subjective:  Patient ID: Tina Parker, female    DOB: 04-03-1971,  MRN: [id number]  Chief Complaint  Patient presents with   Routine Post Op    "It's doing better."      52 y.o. female returns for post-op check.  She is doing well much less pain and swelling now  Review of Systems: Negative except as noted in the HPI. Denies N/V/F/Ch.   Objective:   Vitals:   08/04/22 1039  BP: (!) 141/88  Pulse: 93   There is no height or weight on file to calculate BMI. Constitutional Well developed. Well nourished.  Vascular Foot warm and well perfused. Capillary refill normal to all digits.  Calf is soft and supple, no posterior calf or knee pain, negative Homans' sign  Neurologic Normal speech. Oriented to person, place, and time. Epicritic sensation to light touch grossly present bilaterally.  Dermatologic Incision well healed not hypertrophic  Orthopedic: Minimal edema.  She has mild tenderness to palpation noted about the surgical site.   Multiple view plain film radiographs: Hardware intact no complication noted there is good consolidation across the fusion site Assessment:   1. Hallux valgus with bunions, right    Plan:  Patient was evaluated and treated and all questions answered.  S/p foot surgery right -Overall doing very well and shows good signs of bony healing across the fusion site.  Hardware stable.  Surgical shoe was dispensed today she may begin driving using this and can return to work this week weightbearing as tolerated in the cam boot.  She was unable to return before this using the knee scooter and so has some disability paperwork that we will help complete.  In 2 to 3 weeks can transition to regular shoe gears at home and use the surgical shoe for work.  I will see her back in 6 weeks for new x-rays and should be in regular shoe and full activity at that point  Return in about 6 weeks (around 09/15/2022) for post op (new x-rays).

## 2022-08-04 NOTE — Patient Instructions (Signed)
You may go back to work wearing the boot. For short periods around the house, driving, wear the surgical shoe.   In 2 weeks you can start wearing a regular shoe like your Asics again, start with short periods of time. It may be 3-4 weeks before it is comfortable to wear a regular shoe at work. Can use the surgical shoe during this time as well at work

## 2022-08-06 ENCOUNTER — Encounter: Payer: 59 | Admitting: Podiatry

## 2022-08-07 ENCOUNTER — Encounter: Payer: Self-pay | Admitting: Podiatry

## 2022-08-14 ENCOUNTER — Other Ambulatory Visit: Payer: Self-pay | Admitting: Internal Medicine

## 2022-08-18 ENCOUNTER — Ambulatory Visit: Payer: Self-pay

## 2022-08-22 ENCOUNTER — Other Ambulatory Visit: Payer: Self-pay | Admitting: Internal Medicine

## 2022-08-22 DIAGNOSIS — Z1231 Encounter for screening mammogram for malignant neoplasm of breast: Secondary | ICD-10-CM

## 2022-09-02 ENCOUNTER — Encounter: Payer: Self-pay | Admitting: Pharmacist

## 2022-09-09 ENCOUNTER — Other Ambulatory Visit: Payer: Self-pay | Admitting: Internal Medicine

## 2022-09-15 ENCOUNTER — Ambulatory Visit (INDEPENDENT_AMBULATORY_CARE_PROVIDER_SITE_OTHER): Payer: 59

## 2022-09-15 ENCOUNTER — Ambulatory Visit: Payer: 59 | Admitting: Internal Medicine

## 2022-09-15 ENCOUNTER — Ambulatory Visit (INDEPENDENT_AMBULATORY_CARE_PROVIDER_SITE_OTHER): Payer: 59 | Admitting: Podiatry

## 2022-09-15 ENCOUNTER — Encounter: Payer: Self-pay | Admitting: Internal Medicine

## 2022-09-15 VITALS — BP 128/70 | HR 88 | Temp 98.0°F | Resp 16 | Ht 61.0 in | Wt 135.4 lb

## 2022-09-15 DIAGNOSIS — M21611 Bunion of right foot: Secondary | ICD-10-CM | POA: Diagnosis not present

## 2022-09-15 DIAGNOSIS — E7800 Pure hypercholesterolemia, unspecified: Secondary | ICD-10-CM

## 2022-09-15 DIAGNOSIS — D649 Anemia, unspecified: Secondary | ICD-10-CM

## 2022-09-15 DIAGNOSIS — I10 Essential (primary) hypertension: Secondary | ICD-10-CM

## 2022-09-15 DIAGNOSIS — L601 Onycholysis: Secondary | ICD-10-CM | POA: Diagnosis not present

## 2022-09-15 DIAGNOSIS — E039 Hypothyroidism, unspecified: Secondary | ICD-10-CM

## 2022-09-15 DIAGNOSIS — Z9109 Other allergy status, other than to drugs and biological substances: Secondary | ICD-10-CM

## 2022-09-15 DIAGNOSIS — K219 Gastro-esophageal reflux disease without esophagitis: Secondary | ICD-10-CM

## 2022-09-15 DIAGNOSIS — M059 Rheumatoid arthritis with rheumatoid factor, unspecified: Secondary | ICD-10-CM

## 2022-09-15 DIAGNOSIS — D329 Benign neoplasm of meninges, unspecified: Secondary | ICD-10-CM

## 2022-09-15 DIAGNOSIS — R739 Hyperglycemia, unspecified: Secondary | ICD-10-CM

## 2022-09-15 DIAGNOSIS — M2011 Hallux valgus (acquired), right foot: Secondary | ICD-10-CM

## 2022-09-15 DIAGNOSIS — E042 Nontoxic multinodular goiter: Secondary | ICD-10-CM

## 2022-09-15 DIAGNOSIS — F419 Anxiety disorder, unspecified: Secondary | ICD-10-CM | POA: Diagnosis not present

## 2022-09-15 DIAGNOSIS — Z800 Family history of malignant neoplasm of digestive organs: Secondary | ICD-10-CM

## 2022-09-15 DIAGNOSIS — J309 Allergic rhinitis, unspecified: Secondary | ICD-10-CM | POA: Diagnosis not present

## 2022-09-15 MED ORDER — BUDESONIDE-FORMOTEROL FUMARATE 80-4.5 MCG/ACT IN AERO: INHALATION_SPRAY | RESPIRATORY_TRACT | 5 refills | Status: DC

## 2022-09-15 NOTE — Progress Notes (Signed)
  Subjective:  Patient ID: Tina Parker, female    DOB: 1971/05/31,  MRN: [id number]  Chief Complaint  Patient presents with   Bunions    DOS  06/21/22 Post OP right foot surg 06/27/2022      52 y.o. female returns for post-op check.  She is doing well she is back in regular shoe gear doing her full activities at work, she notes that the great toenail on the right side is getting loose and lifting up and discolored  Review of Systems: Negative except as noted in the HPI. Denies N/V/F/Ch.   Objective:   There were no vitals filed for this visit.  There is no height or weight on file to calculate BMI. Constitutional Well developed. Well nourished.  Vascular Foot warm and well perfused. Capillary refill normal to all digits.  Calf is soft and supple, no posterior calf or knee pain, negative Homans' sign  Neurologic Normal speech. Oriented to person, place, and time. Epicritic sensation to light touch grossly present bilaterally.  Dermatologic Incision well healed not hypertrophic.  Onycholysis and discoloration of right hallux nail plate  Orthopedic: She has no edema or pain around the fusion site   Multiple view plain film radiographs: Complete fusion across arthrodesis site noted, good correction of bunion deformity, no complication of hardware or loss of correction Assessment:   1. Hallux valgus with bunions, right   2. Onycholysis    Plan:  Patient was evaluated and treated and all questions answered.  S/p foot surgery right She is doing well, her fusion site is fully healed she is back to regular shoe gear and activity.  I have no further restrictions for her.  She will follow-up me as needed if she has further issues with the right side or if she is ready to schedule surgery on the left side   Regarding the toenail she had new development of onycholysis following ingrown nail removal previously.  This persisted back to approximately two thirds of the nail plate.  I  recommended avulsion of the remaining portions of the nail plate to alleviate this and to allow the new toenail to grow in unimpeded.  Following consent and digital block with Marcaine and lidocaine and sterile prep with Betadine the right hallux nail plate was avulsed.  It was dressed with Silvadene and a bandage.  Post care instructions given.  No matricectomy was performed, hopefully can grow out uneventfully at this point I discussed with her there is possibility of further dystrophy or nail damage  Return if symptoms worsen or fail to improve.

## 2022-09-15 NOTE — Progress Notes (Signed)
Subjective:    Patient ID: Tina Parker, female    DOB: 1971-02-12, 52 y.o.   MRN: [id number]  Patient here for  Chief Complaint  Patient presents with   Medical Management of Chronic Issues    HPI Here to follow up regarding her blood pressure, allergies and hypercholesterolemia.  Is s/p foot surgery 06/21/22.  Seeing Dr Sherryle Lis.  Dong well.  In a regular shoe.  Just evaluated 09/15/22 - onycholysis following ingrown nail removal previously. Nail plate was avulsed.  Dressed and bandaged.  Reports otherwise doing relatively well.  Allergies are ok.  No increased cough or congestion.  No chest pain or sob reported.  No abdominal pain or bowel change reported.      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   Pure hypercholesterolemia    Rheumatoid arthritis(714.0)    positive anti CCP antibodies, positive RF, oligo-articular, MTX   Sciatica    Sleep apnea    Past Surgical History:  Procedure Laterality Date   ARTHRODESIS METATARSALPHALANGEAL JOINT (MTPJ) Right 06/27/2022   Procedure: ARTHRODESIS METATARSALPHALANGEAL JOINT (MTPJ);  Surgeon: Criselda Peaches, DPM;  Location: [facility];  Service: Podiatry;  Laterality: Right;   CARPAL TUNNEL RELEASE Bilateral    CESAREAN SECTION  1996   COLONOSCOPY  03/14/2002, 08/16/2007, 03/07/2013   [facility]-father   COLONOSCOPY WITH PROPOFOL N/A 06/21/2018   Procedure: COLONOSCOPY WITH PROPOFOL;  Surgeon: Manya Silvas, MD;  Location: [facility] ENDOSCOPY;  Service: Endoscopy;  Laterality: N/A;   CRANIOTOMY Left 06/25/2017   Procedure: CRANIOTOMY TEMPORAL LEFT FOR TUMOR RESECTION;   Surgeon: Ashok Pall, MD;  Location: [place];  Service: Neurosurgery;  Laterality: Left;   ESOPHAGOGASTRODUODENOSCOPY  03/14/2002, 03/07/2013   ESOPHAGOGASTRODUODENOSCOPY (EGD) WITH PROPOFOL N/A 06/21/2018   Procedure: ESOPHAGOGASTRODUODENOSCOPY (EGD) WITH PROPOFOL;  Surgeon: Manya Silvas, MD;  Location: [facility] ENDOSCOPY;  Service: Endoscopy;  Laterality: N/A;   ESOPHAGOGASTRODUODENOSCOPY (EGD) WITH PROPOFOL N/A 07/15/2019   Procedure: ESOPHAGOGASTRODUODENOSCOPY (EGD) WITH PROPOFOL;  Surgeon: Jonathon Bellows, MD;  Location: [facility] ENDOSCOPY;  Service: Gastroenterology;  Laterality: N/A;   FOOT ARTHRODESIS Right 06/27/2022   Procedure: ARTHRODESIS FOOT;  Surgeon: Criselda Peaches, DPM;  Location: [facility];  Service: Podiatry;  Laterality: Right;   Post Septoplasty and turbinate reduction  2007   TRIGGER FINGER RELEASE     Family History  Problem Relation Age of Onset   Cancer Mother        Breast Cancer   Breast cancer Mother 23   Cancer Father        Colon Cancer   Heart disease Father        Hx of MI   Hypertension Father    Diabetes Father    Hyperlipidemia Father    Cancer Maternal Grandmother        lung cancer   Cancer Maternal Uncle        esophageal   Social History   Socioeconomic History   Marital status: Married    Spouse name: ERIC  Number of children: 2   Years of education: Not on file   Highest education level: Not on file  Occupational History   Occupation: Hairstylist  Tobacco Use   Smoking status: Never   Smokeless tobacco: Never  Vaping Use   Vaping Use: Never used  Substance and Sexual Activity   Alcohol use: Yes    Comment: wine cooler 2 drinks 2x a week/ OCCASIONALLY   Drug use: No   Sexual activity: Not on file  Other Topics Concern   Not on file  Social History Narrative   Hairdresser    Social Determinants of Health   Financial Resource Strain: Low Risk  (03/13/2021)   Overall Financial Resource Strain (CARDIA)    Difficulty of Paying Living  Expenses: Not hard at all  Food Insecurity: No Food Insecurity (11/01/2020)   Hunger Vital Sign    Worried About Running Out of Food in the Last Year: Never true    Ran Out of Food in the Last Year: Never true  Transportation Needs: Not on file  Physical Activity: Insufficiently Active (11/01/2020)   Exercise Vital Sign    Days of Exercise per Week: 2 days    Minutes of Exercise per Session: 20 min  Stress: Not on file  Social Connections: Not on file     Review of Systems  Constitutional:  Negative for appetite change and unexpected weight change.  HENT:  Negative for sinus pressure.        No increased congestion.   Respiratory:  Negative for cough, chest tightness and shortness of breath.   Cardiovascular:  Negative for chest pain and palpitations.  Gastrointestinal:  Negative for abdominal pain, diarrhea, nausea and vomiting.  Genitourinary:  Negative for difficulty urinating and dysuria.  Musculoskeletal:  Negative for joint swelling and myalgias.  Skin:  Negative for color change and rash.  Neurological:  Negative for dizziness and headaches.  Psychiatric/Behavioral:  Negative for agitation and dysphoric mood.        Objective:     BP 128/70   Pulse 88   Temp 98 F (36.7 C)   Resp 16   Ht 5\' 1"  (1.549 m)   Wt 135 lb 6.4 oz (61.4 kg)   SpO2 99%   BMI 25.58 kg/m  Wt Readings from Last 3 Encounters:  09/15/22 135 lb 6.4 oz (61.4 kg)  06/27/22 138 lb 0.1 oz (62.6 kg)  06/16/22 138 lb (62.6 kg)    Physical Exam Vitals reviewed.  Constitutional:      General: She is not in acute distress.    Appearance: Normal appearance.  HENT:     Head: Normocephalic and atraumatic.     Right Ear: External ear normal.     Left Ear: External ear normal.  Eyes:     General: No scleral icterus.       Right eye: No discharge.        Left eye: No discharge.     Conjunctiva/sclera: Conjunctivae normal.  Neck:     Thyroid: No thyromegaly.  Cardiovascular:     Rate and Rhythm:  Normal rate and regular rhythm.  Pulmonary:     Effort: No respiratory distress.     Breath sounds: Normal breath sounds. No wheezing.  Abdominal:     General: Bowel sounds are normal.     Palpations: Abdomen is soft.     Tenderness: There is no abdominal tenderness.  Musculoskeletal:        General: No swelling or tenderness.  Cervical back: Neck supple. No tenderness.  Lymphadenopathy:     Cervical: No cervical adenopathy.  Skin:    Findings: No erythema or rash.  Neurological:     Mental Status: She is alert.  Psychiatric:        Mood and Affect: Mood normal.        Behavior: Behavior normal.      Outpatient Encounter Medications as of 09/15/2022  Medication Sig   amLODipine (NORVASC) 5 MG tablet Take 1 tablet (5 mg total) by mouth daily.   Azelastine-Fluticasone 137-50 MCG/ACT SUSP Place 1 spray into both nostrils as needed.   budesonide-formoterol (SYMBICORT) 80-4.5 MCG/ACT inhaler 1-2 puffs twice daily (rinse mouth after use)   calcium carbonate (OSCAL) 1500 (600 Ca) MG TABS tablet Take 1 tablet by mouth daily.   citalopram (CELEXA) 40 MG tablet TAKE 1 TABLET(40 MG) BY MOUTH DAILY   desloratadine (CLARINEX) 5 MG tablet Take 5 mg by mouth daily.   folic acid (FOLVITE) 1 MG tablet Take 1 mg by mouth daily.   gabapentin (NEURONTIN) 300 MG capsule TAKE 1 CAPSULE(300 MG) BY MOUTH THREE TIMES DAILY FOR 7 DAYS (Patient not taking: Reported on 08/04/2022)   losartan (COZAAR) 100 MG tablet TAKE 1 TABLET BY MOUTH DAILY   methotrexate (RHEUMATREX) 2.5 MG tablet Take 22.5 mg by mouth every Monday. Caution:Chemotherapy. Protect from light. Take 9 tabs po weekly with meals   ranitidine (ZANTAC) 150 MG capsule Take 150 mg by mouth 2 (two) times daily as needed for heartburn.   RINVOQ 15 MG TB24 Take 1 tablet by mouth daily.   valACYclovir (VALTREX) 500 MG tablet TAKE 1 TABLET BY MOUTH TWICE DAILY FOR 3 DAYS. CONTINUE 1 BY MOUTH EVERY DAY   vitamin B-12 (CYANOCOBALAMIN) 100 MCG tablet  Take 100 mcg by mouth daily.   WEGOVY 1.7 MG/0.75ML SOAJ INJECT 1.7 MG ONTO SKIN ONCE A WEEK   [DISCONTINUED] acetaminophen (TYLENOL) 500 MG tablet Take 1,000 mg by mouth every 6 (six) hours as needed. (Patient not taking: Reported on 08/04/2022)   [DISCONTINUED] budesonide-formoterol (SYMBICORT) 80-4.5 MCG/ACT inhaler 1-2 puffs twice daily (rinse mouth after use) (Patient taking differently: as needed. 1-2 puffs twice daily (rinse mouth after use))   Facility-Administered Encounter Medications as of 09/15/2022  Medication   betamethasone acetate-betamethasone sodium phosphate (CELESTONE) injection 12 mg     Lab Results  Component Value Date   WBC 5.8 06/02/2022   HGB 10.8 (L) 06/02/2022   HCT 31.7 (L) 06/02/2022   PLT 359.0 06/02/2022   GLUCOSE 77 06/02/2022   CHOL 248 (H) 06/02/2022   TRIG 46.0 06/02/2022   HDL 72.20 06/02/2022   LDLCALC 166 (H) 06/02/2022   ALT 16 06/02/2022   AST 23 06/02/2022   NA 136 06/02/2022   K 4.7 06/02/2022   CL 104 06/02/2022   CREATININE 1.01 06/02/2022   BUN 16 06/02/2022   CO2 27 06/02/2022   TSH 1.73 01/27/2022   HGBA1C 5.4 06/02/2022    DG MINI C-ARM IMAGE ONLY  Result Date: 06/27/2022 There is no interpretation for this exam.  This order is for images obtained during a surgical procedure.  Please See "Surgeries" Tab for more information regarding the procedure.       Assessment & Plan:  Allergic rhinitis, unspecified seasonality, unspecified trigger Assessment & Plan: Remains on xyzal, singulair and flonase/astelin.  Stable.  Continue.  Follow.     Anemia, unspecified type Assessment & Plan: History of IDA.  Has seen Dr Vicente Males previously.  Had EGD and colonoscopy.  Question of need for capsule study or any further GI testing for history of IDA.  Had f/u appt with Dr Vicente Males - 06/2021.  Felt no further w/up warranted.     Anxiety Assessment & Plan: Continue citalopram.  Follow.    Environmental allergies Assessment & Plan: Continue  current allergy regimen.  Has rescue inhaler if needed.  Follow.    Essential hypertension, benign Assessment & Plan: On amlodipine and losartan.  Pressure as outlined.  Amlodipine -  5mg  q day. Follow pressures.  Follow metabolic panel.    Family history of colon cancer Assessment & Plan: Colonoscopy 05/2018 - internal hemorrhoids.    Gastroesophageal reflux disease, unspecified whether esophagitis present Assessment & Plan: Appears to be stable - on prilosec.    Hypercholesterolemia Assessment & Plan: The 10-year ASCVD risk score (Arnett DK, et al., 2019) is: 3.8%   Values used to calculate the score:     Age: 72 years     Sex: Female     Is Non-Hispanic African American: Yes     Diabetic: No     Tobacco smoker: No     Systolic Blood Pressure: 0000000 mmHg     Is BP treated: Yes     HDL Cholesterol: 72.2 mg/dL     Total Cholesterol: 248 mg/dL  Low cholesterol diet and exercise.  Follow lipid panel.    Hyperglycemia Assessment & Plan: Low carb diet and exercise.  Follow met b and a1c.    Hypothyroidism, unspecified type Assessment & Plan: On thyroid replacement.  Follow tsh.    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     Multiple thyroid nodules Assessment & Plan: Seeing endocrinology.  Follow up ultrasound:  Left mid thyroid nodule meets criteria for continued surveillance, as designated by the newly established ACR TI-RADS criteria. Surveillance ultrasound study recommended to be performed annually up to 5 years.  TFTs wnl.  Recommended continued follow up.  Saw Dr Letta Median.  05/19/22.  Recommended f/u in one year.    Rheumatoid arthritis with positive rheumatoid factor, involving unspecified site [facility]) Assessment & Plan: Followed by rheumatology. On MTX.  Also receiving rinvoq.  Stable.      Other orders -     Budesonide-Formoterol Fumarate; 1-2 puffs twice daily (rinse mouth after use)   Dispense: 1 each; Refill: 5     Einar Pheasant, MD

## 2022-09-21 ENCOUNTER — Encounter: Payer: Self-pay | Admitting: Internal Medicine

## 2022-09-21 NOTE — Assessment & Plan Note (Signed)
Continue citalopram.   Follow.  

## 2022-09-21 NOTE — Assessment & Plan Note (Signed)
History of IDA. ?Has seen Dr Anna previously. ?Had EGD and colonoscopy. ?Question of need for capsule study or any further GI testing for history of IDA.  Had f/u appt with Dr Anna - 06/2021.  Felt no further w/up warranted.   ?

## 2022-09-21 NOTE — Assessment & Plan Note (Signed)
Continue current allergy regimen.  Has rescue inhaler if needed.  Follow.  

## 2022-09-21 NOTE — Assessment & Plan Note (Signed)
Followed by Dr Cabbell - [facility].  °

## 2022-09-21 NOTE — Assessment & Plan Note (Signed)
On amlodipine and losartan.  Pressure as outlined.  Amlodipine -  5mg q day. Follow pressures.  Follow metabolic panel.  

## 2022-09-21 NOTE — Assessment & Plan Note (Signed)
Appears to be stable - on prilosec.  

## 2022-09-21 NOTE — Assessment & Plan Note (Signed)
Remains on xyzal, singulair and flonase/astelin.  Stable.  Continue.  Follow.   

## 2022-09-21 NOTE — Assessment & Plan Note (Signed)
On thyroid replacement.  Follow tsh.  

## 2022-09-21 NOTE — Assessment & Plan Note (Signed)
Seeing endocrinology.  Follow up ultrasound:  Left mid thyroid nodule meets criteria for continued surveillance, as designated by the newly established ACR TI-RADS criteria. Surveillance ultrasound study recommended to be performed annually up to 5 years.  TFTs wnl.  Recommended continued follow up.  Saw Dr Letta Median.  05/19/22.  Recommended f/u in one year.

## 2022-09-21 NOTE — Assessment & Plan Note (Signed)
Colonoscopy 05/2018 - internal hemorrhoids.  °

## 2022-09-21 NOTE — Assessment & Plan Note (Signed)
Low carb diet and exercise.  Follow met b and a1c.   

## 2022-09-21 NOTE — Assessment & Plan Note (Signed)
The 10-year ASCVD risk score (Arnett DK, et al., 2019) is: 3.8%   Values used to calculate the score:     Age: 51 years     Sex: Female     Is Non-Hispanic African American: Yes     Diabetic: No     Tobacco smoker: No     Systolic Blood Pressure: 128 mmHg     Is BP treated: Yes     HDL Cholesterol: 72.2 mg/dL     Total Cholesterol: 248 mg/dL  Low cholesterol diet and exercise.  Follow lipid panel.  

## 2022-09-21 NOTE — Assessment & Plan Note (Signed)
Followed by rheumatology. On MTX.  Also receiving rinvoq.  Stable.  ?

## 2022-09-22 ENCOUNTER — Ambulatory Visit
Admission: RE | Admit: 2022-09-22 | Discharge: 2022-09-22 | Disposition: A | Discharge status: Home / Self Care | Payer: 59 | Source: Ambulatory Visit | Attending: Internal Medicine | Admitting: Internal Medicine

## 2022-09-22 ENCOUNTER — Encounter: Payer: Self-pay | Admitting: Radiology

## 2022-09-22 DIAGNOSIS — Z1231 Encounter for screening mammogram for malignant neoplasm of breast: Secondary | ICD-10-CM | POA: Insufficient documentation

## 2022-09-28 ENCOUNTER — Encounter: Payer: Self-pay | Admitting: Internal Medicine

## 2022-09-29 ENCOUNTER — Ambulatory Visit: Admit: 2022-09-29 | Payer: 59 | Source: Home / Self Care

## 2022-09-29 NOTE — Telephone Encounter (Signed)
Patient stopped amlodipine. Went to be covid and flu tested just to be sure since today was her day off- both negative. No other acute symptoms. Will keep [place] posted with pressures since stopping medication. She is still taking her losartan.

## 2022-09-29 NOTE — Telephone Encounter (Signed)
Please confirm no other symptoms - lip swelling, tongue swelling, etc.  Please confirm which medication she stopped.  Will need to follow pressures.  Keep [place] posted on readings and also if cough resolves off of the medication.  If any acute symptoms, needs to be seen.

## 2022-09-30 ENCOUNTER — Telehealth: Payer: Self-pay | Admitting: Pharmacy Technician

## 2022-09-30 NOTE — Telephone Encounter (Signed)
Patient Advocate Encounter   Received notification that prior authorization for Budesonide-Formoterol Fumarate 80-4.5MCG/ACT aerosol is required.   PA submitted on 09/30/2022 [employer] Electronic PA Form Status is pending

## 2022-09-30 NOTE — Telephone Encounter (Signed)
Is Ms Ottey agreeable to changing?

## 2022-09-30 NOTE — Telephone Encounter (Signed)
Patients insurance is requesting alternative for inhaler.  Alternatives: Breo Ellipta Wixela Inhub Fluticasone-Salmeterol  If would prefer to keep original order- PA is pending.

## 2022-09-30 NOTE — Telephone Encounter (Signed)
The patient's drug benefit plan provides coverage for other drugs which may be considered for treating your patient. Can your patient be treated with a formulary drug? Available Formulary Alternatives: fluticasone-salmeterol [id number], [id number]), Wixela Inhub, BREO ELLIPTA (except certain NDCs) [NOTE: If yes, provide your patient with a new prescription for the formulary product.]

## 2022-10-01 NOTE — Telephone Encounter (Signed)
Patient is going to reach out to her allergist who originally prescribed inhaler prior to switching.

## 2022-10-13 DIAGNOSIS — M79676 Pain in unspecified toe(s): Secondary | ICD-10-CM

## 2022-11-07 ENCOUNTER — Ambulatory Visit: Payer: 59 | Admitting: Internal Medicine

## 2022-11-20 NOTE — Telephone Encounter (Signed)
Pt given phone number to contact [employer] for ramp adjustment. Nothing further needed at this time.

## 2022-12-15 ENCOUNTER — Ambulatory Visit: Payer: 59 | Admitting: Internal Medicine

## 2022-12-15 ENCOUNTER — Encounter: Payer: Self-pay | Admitting: Internal Medicine

## 2022-12-15 VITALS — BP 132/82 | HR 89 | Temp 97.9°F | Resp 16 | Ht 61.0 in | Wt 137.6 lb

## 2022-12-15 DIAGNOSIS — I10 Essential (primary) hypertension: Secondary | ICD-10-CM | POA: Diagnosis not present

## 2022-12-15 DIAGNOSIS — R739 Hyperglycemia, unspecified: Secondary | ICD-10-CM | POA: Diagnosis not present

## 2022-12-15 DIAGNOSIS — F419 Anxiety disorder, unspecified: Secondary | ICD-10-CM

## 2022-12-15 DIAGNOSIS — K219 Gastro-esophageal reflux disease without esophagitis: Secondary | ICD-10-CM

## 2022-12-15 DIAGNOSIS — E039 Hypothyroidism, unspecified: Secondary | ICD-10-CM

## 2022-12-15 DIAGNOSIS — E7800 Pure hypercholesterolemia, unspecified: Secondary | ICD-10-CM | POA: Diagnosis not present

## 2022-12-15 DIAGNOSIS — K068 Other specified disorders of gingiva and edentulous alveolar ridge: Secondary | ICD-10-CM

## 2022-12-15 DIAGNOSIS — Z800 Family history of malignant neoplasm of digestive organs: Secondary | ICD-10-CM

## 2022-12-15 DIAGNOSIS — D649 Anemia, unspecified: Secondary | ICD-10-CM | POA: Diagnosis not present

## 2022-12-15 DIAGNOSIS — M059 Rheumatoid arthritis with rheumatoid factor, unspecified: Secondary | ICD-10-CM

## 2022-12-15 DIAGNOSIS — D329 Benign neoplasm of meninges, unspecified: Secondary | ICD-10-CM

## 2022-12-15 DIAGNOSIS — E042 Nontoxic multinodular goiter: Secondary | ICD-10-CM

## 2022-12-15 DIAGNOSIS — J309 Allergic rhinitis, unspecified: Secondary | ICD-10-CM

## 2022-12-15 LAB — HEPATIC FUNCTION PANEL
ALT: 12 U/L (ref 0–35)
AST: 22 U/L (ref 0–37)
Albumin: 4.1 g/dL (ref 3.5–5.2)
Alkaline Phosphatase: 81 U/L (ref 39–117)
Bilirubin, Direct: 0.1 mg/dL (ref 0.0–0.3)
Total Bilirubin: 0.8 mg/dL (ref 0.2–1.2)
Total Protein: 7.9 g/dL (ref 6.0–8.3)

## 2022-12-15 LAB — CBC WITH DIFFERENTIAL/PLATELET
Basophils Absolute: 0 10*3/uL (ref 0.0–0.1)
Basophils Relative: 0.5 % (ref 0.0–3.0)
Eosinophils Absolute: 0 10*3/uL (ref 0.0–0.7)
Eosinophils Relative: 0.5 % (ref 0.0–5.0)
HCT: 34.7 % — ABNORMAL LOW (ref 36.0–46.0)
Hemoglobin: 11.7 g/dL — ABNORMAL LOW (ref 12.0–15.0)
Lymphocytes Relative: 21.5 % (ref 12.0–46.0)
Lymphs Abs: 1.1 10*3/uL (ref 0.7–4.0)
MCHC: 33.7 g/dL (ref 30.0–36.0)
MCV: 95.6 fl (ref 78.0–100.0)
Monocytes Absolute: 0.3 10*3/uL (ref 0.1–1.0)
Monocytes Relative: 6.9 % (ref 3.0–12.0)
Neutro Abs: 3.6 10*3/uL (ref 1.4–7.7)
Neutrophils Relative %: 70.6 % (ref 43.0–77.0)
Platelets: 341 10*3/uL (ref 150.0–400.0)
RBC: 3.64 Mil/uL — ABNORMAL LOW (ref 3.87–5.11)
RDW: 13.7 % (ref 11.5–15.5)
WBC: 5 10*3/uL (ref 4.0–10.5)

## 2022-12-15 LAB — LIPID PANEL
Cholesterol: 231 mg/dL — ABNORMAL HIGH (ref 0–200)
HDL: 69.6 mg/dL (ref >39.00)
LDL Cholesterol: 146 mg/dL — ABNORMAL HIGH (ref 0–99)
NonHDL: 161.88
Total CHOL/HDL Ratio: 3
Triglycerides: 79 mg/dL (ref 0.0–149.0)
VLDL: 15.8 mg/dL (ref 0.0–40.0)

## 2022-12-15 LAB — BASIC METABOLIC PANEL
BUN: 18 mg/dL (ref 6–23)
CO2: 27 mEq/L (ref 19–32)
Calcium: 9.6 mg/dL (ref 8.4–10.5)
Chloride: 98 mEq/L (ref 96–112)
Creatinine, Ser: 0.98 mg/dL (ref 0.40–1.20)
GFR: 65.8 mL/min (ref >60.00)
Glucose, Bld: 86 mg/dL (ref 70–99)
Potassium: 4.2 mEq/L (ref 3.5–5.1)
Sodium: 130 mEq/L — ABNORMAL LOW (ref 135–145)

## 2022-12-15 LAB — IBC + FERRITIN
Ferritin: 131.1 ng/mL (ref 10.0–291.0)
Iron: 105 ug/dL (ref 42–145)
Saturation Ratios: 31 % (ref 20.0–50.0)
TIBC: 338.8 ug/dL (ref 250.0–450.0)
Transferrin: 242 mg/dL (ref 212.0–360.0)

## 2022-12-15 LAB — TSH: TSH: 3.42 u[IU]/mL (ref 0.35–5.50)

## 2022-12-15 LAB — HEMOGLOBIN A1C: Hgb A1c MFr Bld: 5.3 % (ref 4.6–6.5)

## 2022-12-15 LAB — VITAMIN B12: Vitamin B-12: 399 pg/mL (ref 211–911)

## 2022-12-15 NOTE — Progress Notes (Signed)
Subjective:    Patient ID: Tina Parker, female    DOB: 01/09/1971, 52 y.o.   MRN: [id number]  Patient here for  Chief Complaint  Patient presents with   Medical Management of Chronic Issues    HPI Here to follow up regarding her blood pressure, allergies and hypercholesterolemia. Is s/p foot surgery 06/21/22. Seeing Dr Lilian Kapur. Remains on xyzal, singulair and flonase/astelin. Was seen yesterday at acute care - left side headache and left lower gum pain and swelling. Was prescribed clindamycin and tramadol for gum infection.  Discussed follow up with her dentist.  She also reports that over the past couple of weeks, she has had some increased sinus symptoms - congestion, sinus pressure.  No chest pain or sob reported.  Some occasional acid reflux.  Flares with certain foods.  She can control this with monitoring her diet.  Increased stress.  Dicussed.  Will notify me if feels needs further intervention.    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   Pure hypercholesterolemia    Rheumatoid arthritis(714.0)    positive anti CCP antibodies, positive RF, oligo-articular, MTX   Sciatica    Sleep apnea    Past Surgical History:  Procedure Laterality Date   ARTHRODESIS METATARSALPHALANGEAL JOINT (MTPJ) Right 06/27/2022   Procedure: ARTHRODESIS METATARSALPHALANGEAL JOINT (MTPJ);  Surgeon: Edwin Cap, DPM;  Location: [facility] ORS;  Service: Podiatry;  Laterality: Right;   CARPAL TUNNEL RELEASE Bilateral    CESAREAN SECTION  1996   COLONOSCOPY  03/14/2002, 08/16/2007, 03/07/2013   [facility]-father   COLONOSCOPY WITH PROPOFOL  N/A 06/21/2018   Procedure: COLONOSCOPY WITH PROPOFOL;  Surgeon: Scot Jun, MD;  Location: [facility] ENDOSCOPY;  Service: Endoscopy;  Laterality: N/A;   CRANIOTOMY Left 06/25/2017   Procedure: CRANIOTOMY TEMPORAL LEFT FOR TUMOR RESECTION;  Surgeon: Coletta Memos, MD;  Location: [facility] OR;  Service: Neurosurgery;  Laterality: Left;   ESOPHAGOGASTRODUODENOSCOPY  03/14/2002, 03/07/2013   ESOPHAGOGASTRODUODENOSCOPY (EGD) WITH PROPOFOL N/A 06/21/2018   Procedure: ESOPHAGOGASTRODUODENOSCOPY (EGD) WITH PROPOFOL;  Surgeon: Scot Jun, MD;  Location: [facility] ENDOSCOPY;  Service: Endoscopy;  Laterality: N/A;   ESOPHAGOGASTRODUODENOSCOPY (EGD) WITH PROPOFOL N/A 07/15/2019   Procedure: ESOPHAGOGASTRODUODENOSCOPY (EGD) WITH PROPOFOL;  Surgeon: Wyline Mood, MD;  Location: [facility] ENDOSCOPY;  Service: Gastroenterology;  Laterality: N/A;   FOOT ARTHRODESIS Right 06/27/2022   Procedure: ARTHRODESIS FOOT;  Surgeon: Edwin Cap, DPM;  Location: [facility] ORS;  Service: Podiatry;  Laterality: Right;   Post Septoplasty and turbinate reduction  2007   TRIGGER FINGER RELEASE     Family History  Problem Relation Age of Onset   Cancer Mother        Breast Cancer   Breast cancer Mother 56   Cancer Father        Colon Cancer   Heart disease Father        Hx of MI   Hypertension Father    Diabetes Father    Hyperlipidemia Father    Cancer Maternal Grandmother  lung cancer   Cancer Maternal Uncle        esophageal   Social History   Socioeconomic History   Marital status: Married    Spouse name: ERIC   Number of children: 2   Years of education: Not on file   Highest education level: Not on file  Occupational History   Occupation: Hairstylist  Tobacco Use   Smoking status: Never   Smokeless tobacco: Never  Vaping Use   Vaping Use: Never used  Substance and Sexual Activity   Alcohol use: Yes    Comment: wine cooler 2 drinks 2x a week/ OCCASIONALLY   Drug use: No   Sexual activity: Not on file   Other Topics Concern   Not on file  Social History Narrative   Hairdresser    Social Determinants of Health   Financial Resource Strain: Low Risk  (03/13/2021)   Overall Financial Resource Strain (CARDIA)    Difficulty of Paying Living Expenses: Not hard at all  Food Insecurity: No Food Insecurity (11/01/2020)   Hunger Vital Sign    Worried About Running Out of Food in the Last Year: Never true    Ran Out of Food in the Last Year: Never true  Transportation Needs: Not on file  Physical Activity: Insufficiently Active (11/01/2020)   Exercise Vital Sign    Days of Exercise per Week: 2 days    Minutes of Exercise per Session: 20 min  Stress: Not on file  Social Connections: Not on file     Review of Systems  Constitutional:  Negative for appetite change and fever.  HENT:  Positive for congestion, postnasal drip and sinus pressure.   Respiratory:  Negative for chest tightness and shortness of breath.        No increased cough.   Cardiovascular:  Negative for chest pain, palpitations and leg swelling.  Gastrointestinal:  Negative for abdominal pain, diarrhea, nausea and vomiting.  Genitourinary:  Negative for difficulty urinating and dysuria.  Musculoskeletal:  Negative for myalgias.  Skin:  Negative for color change and rash.  Neurological:  Negative for dizziness.       Left side headache and gum issues as outlined.   Psychiatric/Behavioral:  Negative for agitation and dysphoric mood.        Objective:     BP 132/82   Pulse 89   Temp 97.9 F (36.6 C)   Resp 16   Ht 5\' 1"  (1.549 m)   Wt 137 lb 9.6 oz (62.4 kg)   SpO2 98%   BMI 26.00 kg/m  Wt Readings from Last 3 Encounters:  12/15/22 137 lb 9.6 oz (62.4 kg)  09/15/22 135 lb 6.4 oz (61.4 kg)  06/27/22 138 lb 0.1 oz (62.6 kg)    Physical Exam Vitals reviewed.  Constitutional:      General: She is not in acute distress.    Appearance: Normal appearance.  HENT:     Head: Normocephalic and atraumatic.     Right  Ear: Tympanic membrane, ear canal and external ear normal.     Left Ear: Tympanic membrane, ear canal and external ear normal.     Mouth/Throat:     Comments: Left lower gum - minimal erythema. Minimal soft tissue swelling - cheek/angle of jaw Eyes:     General: No scleral icterus.       Right eye: No discharge.        Left eye: No discharge.     Conjunctiva/sclera: Conjunctivae normal.  Neck:  Thyroid: No thyromegaly.  Cardiovascular:     Rate and Rhythm: Normal rate and regular rhythm.  Pulmonary:     Effort: No respiratory distress.     Breath sounds: Normal breath sounds. No wheezing.  Abdominal:     General: Bowel sounds are normal.     Palpations: Abdomen is soft.     Tenderness: There is no abdominal tenderness.  Musculoskeletal:        General: No swelling or tenderness.     Cervical back: Neck supple. No tenderness.  Lymphadenopathy:     Cervical: No cervical adenopathy.  Skin:    Findings: No erythema or rash.  Neurological:     Mental Status: She is alert.  Psychiatric:        Mood and Affect: Mood normal.        Behavior: Behavior normal.      Outpatient Encounter Medications as of 12/15/2022  Medication Sig   Azelastine-Fluticasone 137-50 MCG/ACT SUSP Place 1 spray into both nostrils as needed.   budesonide-formoterol (SYMBICORT) 80-4.5 MCG/ACT inhaler 1-2 puffs twice daily (rinse mouth after use)   calcium carbonate (OSCAL) 1500 (600 Ca) MG TABS tablet Take 1 tablet by mouth daily.   citalopram (CELEXA) 40 MG tablet TAKE 1 TABLET(40 MG) BY MOUTH DAILY   desloratadine (CLARINEX) 5 MG tablet Take 5 mg by mouth daily.   folic acid (FOLVITE) 1 MG tablet Take 1 mg by mouth daily.   losartan (COZAAR) 100 MG tablet TAKE 1 TABLET BY MOUTH DAILY   methotrexate (RHEUMATREX) 2.5 MG tablet Take 22.5 mg by mouth every Monday. Caution:Chemotherapy. Protect from light. Take 9 tabs po weekly with meals   ranitidine (ZANTAC) 150 MG capsule Take 150 mg by mouth 2 (two)  times daily as needed for heartburn.   RINVOQ 15 MG TB24 Take 1 tablet by mouth daily.   valACYclovir (VALTREX) 500 MG tablet TAKE 1 TABLET BY MOUTH TWICE DAILY FOR 3 DAYS. CONTINUE 1 BY MOUTH EVERY DAY   vitamin B-12 (CYANOCOBALAMIN) 100 MCG tablet Take 100 mcg by mouth daily.   WEGOVY 1.7 MG/0.75ML SOAJ INJECT 1.7 MG ONTO SKIN ONCE A WEEK   [DISCONTINUED] amLODipine (NORVASC) 5 MG tablet Take 1 tablet (5 mg total) by mouth daily.   [DISCONTINUED] gabapentin (NEURONTIN) 300 MG capsule TAKE 1 CAPSULE(300 MG) BY MOUTH THREE TIMES DAILY FOR 7 DAYS (Patient not taking: Reported on 08/04/2022)   Facility-Administered Encounter Medications as of 12/15/2022  Medication   betamethasone acetate-betamethasone sodium phosphate (CELESTONE) injection 12 mg     Lab Results  Component Value Date   WBC 5.0 12/15/2022   HGB 11.7 (L) 12/15/2022   HCT 34.7 (L) 12/15/2022   PLT 341.0 12/15/2022   GLUCOSE 86 12/15/2022   CHOL 231 (H) 12/15/2022   TRIG 79.0 12/15/2022   HDL 69.60 12/15/2022   LDLCALC 146 (H) 12/15/2022   ALT 12 12/15/2022   AST 22 12/15/2022   NA 130 (L) 12/15/2022   K 4.2 12/15/2022   CL 98 12/15/2022   CREATININE 0.98 12/15/2022   BUN 18 12/15/2022   CO2 27 12/15/2022   TSH 3.42 12/15/2022   HGBA1C 5.3 12/15/2022    MM 3D SCREEN BREAST BILATERAL  Result Date: 09/23/2022 CLINICAL DATA:  Screening. EXAM: DIGITAL SCREENING BILATERAL MAMMOGRAM WITH TOMOSYNTHESIS AND CAD TECHNIQUE: Bilateral screening digital craniocaudal and mediolateral oblique mammograms were obtained. Bilateral screening digital breast tomosynthesis was performed. The images were evaluated with computer-aided detection. COMPARISON:  Previous exam(s). ACR Breast Density Category  c: The breasts are heterogeneously dense, which may obscure small masses. FINDINGS: There are no findings suspicious for malignancy. IMPRESSION: No mammographic evidence of malignancy. A result letter of this screening mammogram will be mailed  directly to the patient. RECOMMENDATION: Screening mammogram in one year. (Code:SM-B-01Y) BI-RADS CATEGORY  1: Negative. Electronically Signed   By: Ted Mcalpine M.D.   On: 09/23/2022 12:41       Assessment & Plan:  Anemia, unspecified type Assessment & Plan: History of IDA.  Has seen Dr Tobi Bastos previously.  Had EGD and colonoscopy.  Question of need for capsule study or any further GI testing for history of IDA.  Had f/u appt with Dr Tobi Bastos - 06/2021.  Felt no further w/up warranted.    Orders: -     CBC with Differential/Platelet -     Vitamin B12 -     IBC + Ferritin  Hypercholesterolemia Assessment & Plan: The 10-year ASCVD risk score (Arnett DK, et al., 2019) is: 4.2%   Values used to calculate the score:     Age: 36 years     Sex: Female     Is Non-Hispanic African American: Yes     Diabetic: No     Tobacco smoker: No     Systolic Blood Pressure: 132 mmHg     Is BP treated: Yes     HDL Cholesterol: 69.6 mg/dL     Total Cholesterol: 231 mg/dL  Low cholesterol diet and exercise.  Follow lipid panel.   Orders: -     Hepatic function panel -     Basic metabolic panel -     Lipid panel  Hyperglycemia Assessment & Plan: Low carb diet and exercise.  Follow met b and a1c.   Orders: -     Hemoglobin A1c  Hypothyroidism, unspecified type Assessment & Plan: On thyroid replacement.  Follow tsh.   Orders: -     TSH  Essential hypertension, benign Assessment & Plan: On amlodipine and losartan.  Pressure as outlined.  Amlodipine -  5mg  q day. Follow pressures.  Follow metabolic panel.    Allergic rhinitis, unspecified seasonality, unspecified trigger Assessment & Plan: Remains on xyzal, singulair and flonase/astelin.  Continue.  Follow.     Anxiety Assessment & Plan: Continue citalopram.  Follow.    Family history of colon cancer Assessment & Plan: Colonoscopy 05/2018 - internal hemorrhoids.    Gastroesophageal reflux disease, unspecified whether  esophagitis present Assessment & Plan: Appears to be stable - on prilosec - if monitors diet.    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     Multiple thyroid nodules Assessment & Plan: Seeing endocrinology.  Follow up ultrasound:  Left mid thyroid nodule meets criteria for continued surveillance, as designated by the newly established ACR TI-RADS criteria. Surveillance ultrasound study recommended to be performed annually up to 5 years.  TFTs wnl.  Recommended continued follow up.  Saw Dr Wyonia Hough.  05/19/22.  Recommended f/u in one year.    Rheumatoid arthritis with positive rheumatoid factor, involving unspecified site [facility]) Assessment & Plan: Followed by rheumatology. On MTX.  Also receiving rinvoq.  Stable.      Gum lesion Assessment & Plan: On abx.  Due to f/u with dentist.       Dale Hubbard, MD

## 2022-12-16 ENCOUNTER — Telehealth: Payer: Self-pay

## 2022-12-16 ENCOUNTER — Encounter: Payer: Self-pay | Admitting: Internal Medicine

## 2022-12-16 DIAGNOSIS — E871 Hypo-osmolality and hyponatremia: Secondary | ICD-10-CM

## 2022-12-16 NOTE — Telephone Encounter (Signed)
-----   Message from Dale Klukwan, MD sent at 12/16/2022  7:28 AM EDT ----- Notify - hgb has improved from previous checks.  B12 level ok, but at lower end of normal.  If not taking any Vitamin b12, have her start oral B12 q day.  (If already taking this, continue).  Sodium level is low.  Confirm not drinking an excessive amount of free water.  Also make sure she is eating (she was having some gum issues).  Will need to follow.  Recheck sodium (dx hyponatremia) within the next week.  Cholesterol has improved.  Continue diet and exercise.  We will follow.  Overall sugar control wnl.  Thyroid and liver function tests are wnl.

## 2022-12-16 NOTE — Telephone Encounter (Signed)
Pt returned [place] LPN call.

## 2022-12-17 NOTE — Telephone Encounter (Signed)
Called for labs. See other note.

## 2022-12-17 NOTE — Telephone Encounter (Signed)
Sodium lab ordered. 

## 2022-12-17 NOTE — Telephone Encounter (Signed)
Patient call office back. Note was read from Dr Lorin Picket. She will start taking over the counter B12. Yes, she has been drinking "free" water, will cut back. Her gum is better and she is back to eating.  Lab appointment made to check sodium, please add lab order. Patient is coming into office on Monday, 12/22/22 for sodium check.

## 2022-12-17 NOTE — Addendum Note (Signed)
Addended by: Rita Ohara D on: 12/17/2022 11:56 AM   Modules accepted: Orders

## 2022-12-20 ENCOUNTER — Encounter: Payer: Self-pay | Admitting: Internal Medicine

## 2022-12-20 DIAGNOSIS — K068 Other specified disorders of gingiva and edentulous alveolar ridge: Secondary | ICD-10-CM | POA: Insufficient documentation

## 2022-12-20 NOTE — Assessment & Plan Note (Signed)
On abx.  Due to f/u with dentist.

## 2022-12-20 NOTE — Assessment & Plan Note (Signed)
Remains on xyzal, singulair and flonase/astelin.  Continue.  Follow.

## 2022-12-20 NOTE — Assessment & Plan Note (Signed)
The 10-year ASCVD risk score (Arnett DK, et al., 2019) is: 4.2%   Values used to calculate the score:     Age: 52 years     Sex: Female     Is Non-Hispanic African American: Yes     Diabetic: No     Tobacco smoker: No     Systolic Blood Pressure: 132 mmHg     Is BP treated: Yes     HDL Cholesterol: 69.6 mg/dL     Total Cholesterol: 231 mg/dL  Low cholesterol diet and exercise.  Follow lipid panel.

## 2022-12-20 NOTE — Assessment & Plan Note (Signed)
On thyroid replacement.  Follow tsh.  

## 2022-12-20 NOTE — Assessment & Plan Note (Signed)
On amlodipine and losartan.  Pressure as outlined.  Amlodipine -  5mg q day. Follow pressures.  Follow metabolic panel.  

## 2022-12-20 NOTE — Assessment & Plan Note (Signed)
Followed by Dr Cabbell - [facility].  °

## 2022-12-20 NOTE — Assessment & Plan Note (Signed)
Colonoscopy 05/2018 - internal hemorrhoids.  °

## 2022-12-20 NOTE — Assessment & Plan Note (Signed)
History of IDA. ?Has seen Dr Anna previously. ?Had EGD and colonoscopy. ?Question of need for capsule study or any further GI testing for history of IDA.  Had f/u appt with Dr Anna - 06/2021.  Felt no further w/up warranted.   ?

## 2022-12-20 NOTE — Assessment & Plan Note (Signed)
Followed by rheumatology. On MTX.  Also receiving rinvoq.  Stable.  ?

## 2022-12-20 NOTE — Assessment & Plan Note (Signed)
Low carb diet and exercise.  Follow met b and a1c.   

## 2022-12-20 NOTE — Assessment & Plan Note (Signed)
Appears to be stable - on prilosec - if monitors diet.

## 2022-12-20 NOTE — Assessment & Plan Note (Signed)
Continue citalopram.   Follow.  

## 2022-12-20 NOTE — Assessment & Plan Note (Signed)
Seeing endocrinology.  Follow up ultrasound:  Left mid thyroid nodule meets criteria for continued surveillance, as designated by the newly established ACR TI-RADS criteria. Surveillance ultrasound study recommended to be performed annually up to 5 years.  TFTs wnl.  Recommended continued follow up.  Saw Dr Gerghe.  05/19/22.  Recommended f/u in one year.  

## 2022-12-22 ENCOUNTER — Other Ambulatory Visit (INDEPENDENT_AMBULATORY_CARE_PROVIDER_SITE_OTHER): Payer: 59

## 2022-12-22 DIAGNOSIS — E871 Hypo-osmolality and hyponatremia: Secondary | ICD-10-CM | POA: Diagnosis not present

## 2022-12-22 LAB — SODIUM: Sodium: 136 mEq/L (ref 135–145)

## 2023-01-06 ENCOUNTER — Ambulatory Visit: Payer: 59 | Admitting: Internal Medicine

## 2023-01-17 ENCOUNTER — Other Ambulatory Visit: Payer: Self-pay | Admitting: Internal Medicine

## 2023-01-26 ENCOUNTER — Other Ambulatory Visit: Payer: Self-pay | Admitting: Internal Medicine

## 2023-01-27 NOTE — Telephone Encounter (Signed)
Rx ok'd for valtrex.

## 2023-01-30 ENCOUNTER — Other Ambulatory Visit: Payer: Self-pay | Admitting: Internal Medicine

## 2023-02-02 ENCOUNTER — Encounter: Payer: Self-pay | Admitting: Podiatry

## 2023-02-02 ENCOUNTER — Ambulatory Visit (INDEPENDENT_AMBULATORY_CARE_PROVIDER_SITE_OTHER): Payer: 59

## 2023-02-02 ENCOUNTER — Ambulatory Visit: Payer: 59 | Admitting: Podiatry

## 2023-02-02 DIAGNOSIS — M2011 Hallux valgus (acquired), right foot: Secondary | ICD-10-CM

## 2023-02-02 DIAGNOSIS — M205X2 Other deformities of toe(s) (acquired), left foot: Secondary | ICD-10-CM

## 2023-02-02 DIAGNOSIS — M205X1 Other deformities of toe(s) (acquired), right foot: Secondary | ICD-10-CM | POA: Diagnosis not present

## 2023-02-02 DIAGNOSIS — M21611 Bunion of right foot: Secondary | ICD-10-CM | POA: Diagnosis not present

## 2023-02-02 DIAGNOSIS — T8484XA Pain due to internal orthopedic prosthetic devices, implants and grafts, initial encounter: Secondary | ICD-10-CM

## 2023-02-02 DIAGNOSIS — M2012 Hallux valgus (acquired), left foot: Secondary | ICD-10-CM | POA: Diagnosis not present

## 2023-02-02 DIAGNOSIS — M7752 Other enthesopathy of left foot: Secondary | ICD-10-CM | POA: Diagnosis not present

## 2023-02-02 NOTE — Progress Notes (Signed)
  Subjective:  Patient ID: Tina Parker, female    DOB: Oct 17, 1970,  MRN: [id number]  Chief Complaint  Patient presents with   Routine Post Op    "My right foot is doing fine.  It's still tender.  My bunion and my left big toe bother me now."      52 y.o. female returns for post-op check.  She returns for follow-up, having some sensitivity and tenderness around the incision on the right foot and underneath in the arch, she also feels that she is walking a little bit more towards the outside of the foot to take pressure off of this.  Her left foot bunion is feeling worse and has had a recent flare of her rheumatoid arthritis causing pain.  She would like to try to hold off on surgery on this side as long as she can.  Review of Systems: Negative except as noted in the HPI. Denies N/V/F/Ch.   Objective:   There were no vitals filed for this visit.  There is no height or weight on file to calculate BMI. Constitutional Well developed. Well nourished.  Vascular Foot warm and well perfused. Capillary refill normal to all digits.  Calf is soft and supple, no posterior calf or knee pain, negative Homans' sign  Neurologic Normal speech. Oriented to person, place, and time. Epicritic sensation to light touch grossly present bilaterally.  Dermatologic Incision well healed not hypertrophic.   Orthopedic:  Tenderness distally medial laterally around the incision on the dorsal hallux, some tenderness in the plantar distal first metatarsal.  Hallux valgus deformity on the left foot with tenderness and swelling around the first MPJ   Multiple view plain film radiographs: Films of both feet taken today show maintained alignment and hardware with good consolidation remaining on the right foot no new complication, there is hallux valgus deformity with large bunion medially on the left foot Assessment:   1. Hav (hallux abducto valgus), left   2. Hallux valgus with bunions, right   3. Pain due to  internal orthopedic prosthetic devices, implants and grafts, initial encounter (HCC)   4. Capsulitis of metatarsophalangeal (MTP) joint of left foot   5. Hallux limitus of left foot   6. Hallux limitus, right    Plan:  Patient was evaluated and treated and all questions answered.  Regarding her right foot pain, her foot remains well-healed, she does have some pain around the internal hardware on the right foot.  We did discuss the possibility of removal of the plate and screws if she continues to have pain.  We discussed supporting the foot with a functional orthotic and I do think she would benefit from this.  She was casted for these today.  This should also help her left foot in regards to support and offloading the first MPJ.  We again discussed treatment of her left foot including eventual surgical treatment.  She would like to hold off on this longer which I think is reasonable, she does have joint space maintained compared to what her right foot was prior to surgery.  She does have some inflammation after recent rheumatoid arthritis flare in the joint.  I recommended corticosteroid injection.  Following consent and sterile prep with Betadine, 4 mg dexamethasone and 0.5 cc of 0.5% Marcaine plain was injected into the left first MPJ from a dorsal approach.  She tolerated as well.  Return if symptoms worsen or fail to improve.

## 2023-02-12 ENCOUNTER — Encounter (INDEPENDENT_AMBULATORY_CARE_PROVIDER_SITE_OTHER): Payer: Self-pay

## 2023-02-19 ENCOUNTER — Other Ambulatory Visit: Payer: Self-pay | Admitting: Internal Medicine

## 2023-02-20 ENCOUNTER — Other Ambulatory Visit: Payer: 59

## 2023-02-20 ENCOUNTER — Encounter: Payer: Self-pay | Admitting: Internal Medicine

## 2023-02-23 ENCOUNTER — Other Ambulatory Visit: Payer: Self-pay

## 2023-02-23 ENCOUNTER — Encounter: Payer: Self-pay | Admitting: Internal Medicine

## 2023-02-23 MED ORDER — VALACYCLOVIR HCL 500 MG PO TABS: ORAL_TABLET | ORAL | 2 refills | Status: DC

## 2023-02-25 ENCOUNTER — Other Ambulatory Visit: Payer: Self-pay | Admitting: Internal Medicine

## 2023-03-09 ENCOUNTER — Other Ambulatory Visit: Payer: 59

## 2023-03-19 ENCOUNTER — Other Ambulatory Visit (HOSPITAL_COMMUNITY)
Admission: RE | Admit: 2023-03-19 | Discharge: 2023-03-19 | Disposition: A | Discharge status: Home / Self Care | Payer: 59 | Source: Ambulatory Visit | Attending: Internal Medicine | Admitting: Internal Medicine

## 2023-03-19 ENCOUNTER — Encounter: Payer: Self-pay | Admitting: Internal Medicine

## 2023-03-19 ENCOUNTER — Ambulatory Visit (INDEPENDENT_AMBULATORY_CARE_PROVIDER_SITE_OTHER): Payer: 59 | Admitting: Internal Medicine

## 2023-03-19 VITALS — BP 138/78 | HR 95 | Temp 98.5°F | Ht 61.0 in | Wt 133.6 lb

## 2023-03-19 DIAGNOSIS — E039 Hypothyroidism, unspecified: Secondary | ICD-10-CM

## 2023-03-19 DIAGNOSIS — M059 Rheumatoid arthritis with rheumatoid factor, unspecified: Secondary | ICD-10-CM

## 2023-03-19 DIAGNOSIS — Z124 Encounter for screening for malignant neoplasm of cervix: Secondary | ICD-10-CM | POA: Diagnosis present

## 2023-03-19 DIAGNOSIS — Z1211 Encounter for screening for malignant neoplasm of colon: Secondary | ICD-10-CM

## 2023-03-19 DIAGNOSIS — E7800 Pure hypercholesterolemia, unspecified: Secondary | ICD-10-CM

## 2023-03-19 DIAGNOSIS — K219 Gastro-esophageal reflux disease without esophagitis: Secondary | ICD-10-CM

## 2023-03-19 DIAGNOSIS — Z0000 Encounter for general adult medical examination without abnormal findings: Secondary | ICD-10-CM

## 2023-03-19 DIAGNOSIS — E041 Nontoxic single thyroid nodule: Secondary | ICD-10-CM

## 2023-03-19 DIAGNOSIS — I10 Essential (primary) hypertension: Secondary | ICD-10-CM

## 2023-03-19 DIAGNOSIS — R739 Hyperglycemia, unspecified: Secondary | ICD-10-CM

## 2023-03-19 DIAGNOSIS — U071 COVID-19: Secondary | ICD-10-CM

## 2023-03-19 DIAGNOSIS — F419 Anxiety disorder, unspecified: Secondary | ICD-10-CM

## 2023-03-19 DIAGNOSIS — D329 Benign neoplasm of meninges, unspecified: Secondary | ICD-10-CM

## 2023-03-19 LAB — LIPID PANEL
Cholesterol: 245 mg/dL — ABNORMAL HIGH (ref 0–200)
HDL: 80.3 mg/dL (ref >39.00)
LDL Cholesterol: 155 mg/dL — ABNORMAL HIGH (ref 0–99)
NonHDL: 164.48
Total CHOL/HDL Ratio: 3
Triglycerides: 49 mg/dL (ref 0.0–149.0)
VLDL: 9.8 mg/dL (ref 0.0–40.0)

## 2023-03-19 LAB — BASIC METABOLIC PANEL
BUN: 9 mg/dL (ref 6–23)
CO2: 30 mEq/L (ref 19–32)
Calcium: 9.7 mg/dL (ref 8.4–10.5)
Chloride: 103 mEq/L (ref 96–112)
Creatinine, Ser: 0.88 mg/dL (ref 0.40–1.20)
GFR: 74.73 mL/min (ref >60.00)
Glucose, Bld: 82 mg/dL (ref 70–99)
Potassium: 4.3 mEq/L (ref 3.5–5.1)
Sodium: 139 mEq/L (ref 135–145)

## 2023-03-19 LAB — CBC WITH DIFFERENTIAL/PLATELET
Basophils Absolute: 0 10*3/uL (ref 0.0–0.1)
Basophils Relative: 0.6 % (ref 0.0–3.0)
Eosinophils Absolute: 0 10*3/uL (ref 0.0–0.7)
Eosinophils Relative: 0.8 % (ref 0.0–5.0)
HCT: 35 % — ABNORMAL LOW (ref 36.0–46.0)
Hemoglobin: 11.6 g/dL — ABNORMAL LOW (ref 12.0–15.0)
Lymphocytes Relative: 31.9 % (ref 12.0–46.0)
Lymphs Abs: 1.4 10*3/uL (ref 0.7–4.0)
MCHC: 33.2 g/dL (ref 30.0–36.0)
MCV: 95.2 fl (ref 78.0–100.0)
Monocytes Absolute: 0.4 10*3/uL (ref 0.1–1.0)
Monocytes Relative: 8.9 % (ref 3.0–12.0)
Neutro Abs: 2.6 10*3/uL (ref 1.4–7.7)
Neutrophils Relative %: 57.8 % (ref 43.0–77.0)
Platelets: 338 10*3/uL (ref 150.0–400.0)
RBC: 3.68 Mil/uL — ABNORMAL LOW (ref 3.87–5.11)
RDW: 13.6 % (ref 11.5–15.5)
WBC: 4.5 10*3/uL (ref 4.0–10.5)

## 2023-03-19 LAB — HEPATIC FUNCTION PANEL
ALT: 13 U/L (ref 0–35)
AST: 23 U/L (ref 0–37)
Albumin: 4 g/dL (ref 3.5–5.2)
Alkaline Phosphatase: 90 U/L (ref 39–117)
Bilirubin, Direct: 0.1 mg/dL (ref 0.0–0.3)
Total Bilirubin: 0.6 mg/dL (ref 0.2–1.2)
Total Protein: 7.1 g/dL (ref 6.0–8.3)

## 2023-03-19 LAB — HEMOGLOBIN A1C: Hgb A1c MFr Bld: 5.3 % (ref 4.6–6.5)

## 2023-03-19 MED ORDER — CITALOPRAM HYDROBROMIDE 40 MG PO TABS: ORAL_TABLET | ORAL | 5 refills | Status: DC

## 2023-03-19 MED ORDER — WEGOVY 1 MG/0.5ML ~~LOC~~ SOAJ: 1.0000 mg | SUBCUTANEOUS | 4 refills | Status: DC

## 2023-03-19 NOTE — Assessment & Plan Note (Signed)
On amlodipine and losartan.  Pressure as outlined.  Amlodipine -  5mg  q day. Follow pressures.  Follow metabolic panel.

## 2023-03-19 NOTE — Assessment & Plan Note (Signed)
Symptoms started 03/09/23.  Doing better.  Continue astelin, flonase and saline.  No sob.  Follow.

## 2023-03-19 NOTE — Assessment & Plan Note (Signed)
Low carb diet and exercise.  Follow met b and a1c.  

## 2023-03-19 NOTE — Assessment & Plan Note (Signed)
Followed by Dr Franky Macho - [facility].

## 2023-03-19 NOTE — Assessment & Plan Note (Addendum)
Physical today 03/19/23.  Mammogram 09/22/22 - Birads I.  PAP 11/22/20- negative with negative HPV. Follow up pap today. Colonoscopy 05/2018 - internal hemorrhoids.  Recommend f/u colonoscopy in 05/2023.  Referred to GI for f/u colonoscopy.

## 2023-03-19 NOTE — Assessment & Plan Note (Signed)
Followed by rheumatology. On MTX.  Also receiving rinvoq.  Stable.  ?

## 2023-03-19 NOTE — Assessment & Plan Note (Signed)
On thyroid replacement.  Follow tsh.  

## 2023-03-19 NOTE — Assessment & Plan Note (Signed)
Endocrinology 06/03/22 - f/u one year.  Monitoring 2cm nodule.

## 2023-03-19 NOTE — Assessment & Plan Note (Signed)
The 10-year ASCVD risk score (Arnett DK, et al., 2019) is: 4.9%   Values used to calculate the score:     Age: 52 years     Sex: Female     Is Non-Hispanic African American: Yes     Diabetic: No     Tobacco smoker: No     Systolic Blood Pressure: 138 mmHg     Is BP treated: Yes     HDL Cholesterol: 69.6 mg/dL     Total Cholesterol: 231 mg/dL  Low cholesterol diet and exercise.  Follow lipid panel.

## 2023-03-19 NOTE — Assessment & Plan Note (Signed)
Appears to be stable - on prilosec - if monitors diet.

## 2023-03-19 NOTE — Progress Notes (Signed)
Subjective:    Patient ID: Tina Parker, female    DOB: 08/02/1970, 52 y.o.   MRN: [id number]  Patient here for  Chief Complaint  Patient presents with   Annual Exam    HPI Here for her physical exam. Is s/p foot surgery 06/21/22. Seeing Dr Lilian Kapur. Last evaluated 02/02/23.  Some persistent pain around the internal hardware right foot. Recommended functional orthotic.  Also s/p injection left first MPJ. Waiting for orthotic. Plans to get back into more exercise routine once she gets the orthotic. Remains on xyzal, singulair and flonase/astelin. Tested positive for covid.  Symptoms started approximately 03/09/23.  No chest pain or sob.  No chest congestion.  Feeling better.  No vomiting or diarrhea. Followed by rheumatology for RA. Currently treated with methotrexate and rinvoq. Joints appear stable. Handling stress.    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   Pure hypercholesterolemia    Rheumatoid arthritis(714.0)    positive anti CCP antibodies, positive RF, oligo-articular, MTX   Sciatica    Sleep apnea    Past Surgical History:  Procedure Laterality Date   ARTHRODESIS METATARSALPHALANGEAL JOINT (MTPJ) Right 06/27/2022   Procedure: ARTHRODESIS METATARSALPHALANGEAL JOINT (MTPJ);  Surgeon: Edwin Cap, DPM;  Location: [facility] ORS;  Service: Podiatry;  Laterality: Right;   CARPAL TUNNEL RELEASE Bilateral    CESAREAN SECTION  1996   COLONOSCOPY  03/14/2002, 08/16/2007, 03/07/2013   [facility]-father   COLONOSCOPY WITH PROPOFOL N/A 06/21/2018   Procedure: COLONOSCOPY WITH PROPOFOL;  Surgeon: Scot Jun, MD;   Location: [facility] ENDOSCOPY;  Service: Endoscopy;  Laterality: N/A;   CRANIOTOMY Left 06/25/2017   Procedure: CRANIOTOMY TEMPORAL LEFT FOR TUMOR RESECTION;  Surgeon: Coletta Memos, MD;  Location: [facility] OR;  Service: Neurosurgery;  Laterality: Left;   ESOPHAGOGASTRODUODENOSCOPY  03/14/2002, 03/07/2013   ESOPHAGOGASTRODUODENOSCOPY (EGD) WITH PROPOFOL N/A 06/21/2018   Procedure: ESOPHAGOGASTRODUODENOSCOPY (EGD) WITH PROPOFOL;  Surgeon: Scot Jun, MD;  Location: [facility] ENDOSCOPY;  Service: Endoscopy;  Laterality: N/A;   ESOPHAGOGASTRODUODENOSCOPY (EGD) WITH PROPOFOL N/A 07/15/2019   Procedure: ESOPHAGOGASTRODUODENOSCOPY (EGD) WITH PROPOFOL;  Surgeon: Wyline Mood, MD;  Location: [facility] ENDOSCOPY;  Service: Gastroenterology;  Laterality: N/A;   FOOT ARTHRODESIS Right 06/27/2022   Procedure: ARTHRODESIS FOOT;  Surgeon: Edwin Cap, DPM;  Location: [facility] ORS;  Service: Podiatry;  Laterality: Right;   Post Septoplasty and turbinate reduction  2007   TRIGGER FINGER RELEASE     Family History  Problem Relation Age of Onset   Cancer Mother        Breast Cancer   Breast cancer Mother 75   Cancer Father        Colon Cancer   Heart disease Father        Hx of MI   Hypertension Father    Diabetes Father    Hyperlipidemia Father    Cancer Maternal Grandmother        lung cancer   Cancer Maternal Uncle        esophageal   Social History  Socioeconomic History   Marital status: Married    Spouse name: ERIC   Number of children: 2   Years of education: Not on file   Highest education level: Not on file  Occupational History   Occupation: Hairstylist  Tobacco Use   Smoking status: Never   Smokeless tobacco: Never  Vaping Use   Vaping status: Never Used  Substance and Sexual Activity   Alcohol use: Not Currently    Comment: wine cooler 2 drinks 2x a week/ OCCASIONALLY   Drug use: No   Sexual activity: Not on file  Other Topics Concern   Not on file  Social History Narrative    Hairdresser    Social Determinants of Health   Financial Resource Strain: Low Risk  (03/13/2021)   Overall Financial Resource Strain (CARDIA)    Difficulty of Paying Living Expenses: Not hard at all  Food Insecurity: No Food Insecurity (11/01/2020)   Hunger Vital Sign    Worried About Running Out of Food in the Last Year: Never true    Ran Out of Food in the Last Year: Never true  Transportation Needs: Not on file  Physical Activity: Insufficiently Active (11/01/2020)   Exercise Vital Sign    Days of Exercise per Week: 2 days    Minutes of Exercise per Session: 20 min  Stress: Not on file  Social Connections: Not on file     Review of Systems  Constitutional:  Negative for appetite change and unexpected weight change.  HENT:  Positive for congestion and sinus pressure. Negative for sore throat.   Eyes:  Negative for pain and visual disturbance.  Respiratory:  Positive for cough. Negative for chest tightness and shortness of breath.   Cardiovascular:  Negative for chest pain and palpitations.  Gastrointestinal:  Negative for abdominal pain, diarrhea, nausea and vomiting.  Genitourinary:  Negative for difficulty urinating and dysuria.  Musculoskeletal:  Negative for joint swelling and myalgias.  Skin:  Negative for color change and rash.  Neurological:  Negative for dizziness and headaches.  Hematological:  Negative for adenopathy. Does not bruise/bleed easily.  Psychiatric/Behavioral:  Negative for agitation and dysphoric mood.        Objective:     BP 138/78   Pulse 95   Temp 98.5 F (36.9 C) (Oral)   Ht 5\' 1"  (1.549 m)   Wt 133 lb 9.6 oz (60.6 kg)   SpO2 98%   BMI 25.24 kg/m  Wt Readings from Last 3 Encounters:  03/19/23 133 lb 9.6 oz (60.6 kg)  12/15/22 137 lb 9.6 oz (62.4 kg)  09/15/22 135 lb 6.4 oz (61.4 kg)    Physical Exam Vitals reviewed.  Constitutional:      General: She is not in acute distress.    Appearance: Normal appearance. She is well-developed.   HENT:     Head: Normocephalic and atraumatic.     Right Ear: External ear normal.     Left Ear: External ear normal.  Eyes:     General: No scleral icterus.       Right eye: No discharge.        Left eye: No discharge.     Conjunctiva/sclera: Conjunctivae normal.  Neck:     Thyroid: No thyromegaly.  Cardiovascular:     Rate and Rhythm: Normal rate and regular rhythm.  Pulmonary:     Effort: No tachypnea, accessory muscle usage or respiratory distress.     Breath sounds: Normal breath sounds. No decreased breath sounds or  wheezing.  Chest:  Breasts:    Right: No inverted nipple, mass, nipple discharge or tenderness (no axillary adenopathy).     Left: No inverted nipple, mass, nipple discharge or tenderness (no axilarry adenopathy).  Abdominal:     General: Bowel sounds are normal.     Palpations: Abdomen is soft.     Tenderness: There is no abdominal tenderness.  Genitourinary:    Comments: Normal external genitalia.  Vaginal vault without lesions.  Cervix identified.  Pap smear performed.  Could not appreciate any adnexal masses or tenderness.   Musculoskeletal:        General: No swelling or tenderness.     Cervical back: Neck supple.  Lymphadenopathy:     Cervical: No cervical adenopathy.  Skin:    Findings: No erythema or rash.  Neurological:     Mental Status: She is alert and oriented to person, place, and time.  Psychiatric:        Mood and Affect: Mood normal.        Behavior: Behavior normal.      Outpatient Encounter Medications as of 03/19/2023  Medication Sig   Semaglutide-Weight Management (WEGOVY) 1 MG/0.5ML SOAJ Inject 1 mg into the skin once a week.   Azelastine-Fluticasone 137-50 MCG/ACT SUSP Place 1 spray into both nostrils as needed.   budesonide-formoterol (SYMBICORT) 80-4.5 MCG/ACT inhaler 1-2 puffs twice daily (rinse mouth after use)   calcium carbonate (OSCAL) 1500 (600 Ca) MG TABS tablet Take 1 tablet by mouth daily.   citalopram (CELEXA) 40 MG  tablet TAKE 1 TABLET(40 MG) BY MOUTH DAILY   desloratadine (CLARINEX) 5 MG tablet Take 5 mg by mouth daily.   folic acid (FOLVITE) 1 MG tablet Take 1 mg by mouth daily.   losartan (COZAAR) 100 MG tablet TAKE 1 TABLET BY MOUTH DAILY   methotrexate (RHEUMATREX) 2.5 MG tablet Take 22.5 mg by mouth every Monday. Caution:Chemotherapy. Protect from light. Take 9 tabs po weekly with meals   RINVOQ 15 MG TB24 Take 1 tablet by mouth daily.   valACYclovir (VALTREX) 500 MG tablet TAKE 1 TABLET BY MOUTH TWICE DAILY FOR 3 DAYS. CONTINUE 1 BY MOUTH EVERY DAY   vitamin B-12 (CYANOCOBALAMIN) 100 MCG tablet Take 100 mcg by mouth daily.   [DISCONTINUED] citalopram (CELEXA) 40 MG tablet TAKE 1 TABLET(40 MG) BY MOUTH DAILY   [DISCONTINUED] methylPREDNISolone (MEDROL DOSEPAK) 4 MG TBPK tablet Take by mouth.   [DISCONTINUED] ranitidine (ZANTAC) 150 MG capsule Take 150 mg by mouth 2 (two) times daily as needed for heartburn.   [DISCONTINUED] WEGOVY 1.7 MG/0.75ML SOAJ INJECT 1.7 MG ONTO SKIN ONCE A WEEK   Facility-Administered Encounter Medications as of 03/19/2023  Medication   betamethasone acetate-betamethasone sodium phosphate (CELESTONE) injection 12 mg     Lab Results  Component Value Date   WBC 5.0 12/15/2022   HGB 11.7 (L) 12/15/2022   HCT 34.7 (L) 12/15/2022   PLT 341.0 12/15/2022   GLUCOSE 86 12/15/2022   CHOL 231 (H) 12/15/2022   TRIG 79.0 12/15/2022   HDL 69.60 12/15/2022   LDLCALC 146 (H) 12/15/2022   ALT 12 12/15/2022   AST 22 12/15/2022   NA 136 12/22/2022   K 4.2 12/15/2022   CL 98 12/15/2022   CREATININE 0.98 12/15/2022   BUN 18 12/15/2022   CO2 27 12/15/2022   TSH 3.42 12/15/2022   HGBA1C 5.3 12/15/2022    MM 3D SCREEN BREAST BILATERAL  Result Date: 09/23/2022 CLINICAL DATA:  Screening. EXAM: DIGITAL SCREENING BILATERAL MAMMOGRAM  WITH TOMOSYNTHESIS AND CAD TECHNIQUE: Bilateral screening digital craniocaudal and mediolateral oblique mammograms were obtained. Bilateral screening  digital breast tomosynthesis was performed. The images were evaluated with computer-aided detection. COMPARISON:  Previous exam(s). ACR Breast Density Category c: The breasts are heterogeneously dense, which may obscure small masses. FINDINGS: There are no findings suspicious for malignancy. IMPRESSION: No mammographic evidence of malignancy. A result letter of this screening mammogram will be mailed directly to the patient. RECOMMENDATION: Screening mammogram in one year. (Code:SM-B-01Y) BI-RADS CATEGORY  1: Negative. Electronically Signed   By: Ted Mcalpine M.D.   On: 09/23/2022 12:41       Assessment & Plan:  Routine general medical examination at a health care facility  Hyperglycemia Assessment & Plan: Low carb diet and exercise.  Follow met b and a1c.   Orders: -     Hemoglobin A1c  Hypercholesterolemia Assessment & Plan: The 10-year ASCVD risk score (Arnett DK, et al., 2019) is: 4.9%   Values used to calculate the score:     Age: 52 years     Sex: Female     Is Non-Hispanic African American: Yes     Diabetic: No     Tobacco smoker: No     Systolic Blood Pressure: 138 mmHg     Is BP treated: Yes     HDL Cholesterol: 69.6 mg/dL     Total Cholesterol: 231 mg/dL  Low cholesterol diet and exercise.  Follow lipid panel.   Orders: -     Basic metabolic panel -     CBC with Differential/Platelet -     Hepatic function panel -     Lipid panel  Health care maintenance Assessment & Plan: Physical today 03/19/23.  Mammogram 09/22/22 - Birads I.  PAP 11/22/20- negative with negative HPV. Follow up pap today. Colonoscopy 05/2018 - internal hemorrhoids.  Recommend f/u colonoscopy in 05/2023.  Referred to GI for f/u colonoscopy.    Colon cancer screening -     Ambulatory referral to Gastroenterology  Screening for cervical cancer -     Cytology - PAP  Thyroid nodule Assessment & Plan: Endocrinology 06/03/22 - f/u one year.  Monitoring 2cm nodule.    Rheumatoid arthritis  with positive rheumatoid factor, involving unspecified site [facility]) Assessment & Plan: Followed by rheumatology. On MTX.  Also receiving rinvoq.  Stable.      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     Hypothyroidism, unspecified type Assessment & Plan: On thyroid replacement.  Follow tsh.    Gastroesophageal reflux disease, unspecified whether esophagitis present Assessment & Plan: Appears to be stable - on prilosec - if monitors diet.    Essential hypertension, benign Assessment & Plan: On amlodipine and losartan.  Pressure as outlined.  Amlodipine -  5mg  q day. Follow pressures.  Follow metabolic panel.    Anxiety Assessment & Plan: Continue citalopram.  Follow.    COVID-19 virus infection Assessment & Plan: Symptoms started 03/09/23.  Doing better.  Continue astelin, flonase and saline.  No sob.  Follow.     Other orders -     Citalopram Hydrobromide; TAKE 1 TABLET(40 MG) BY MOUTH DAILY  Dispense: 30 tablet; Refill: 5 -     Wegovy; Inject 1 mg into the skin once a week.  Dispense: 2 mL; Refill: 4     Dale Edgar, MD

## 2023-03-19 NOTE — Assessment & Plan Note (Signed)
Continue citalopram.   Follow.

## 2023-03-20 ENCOUNTER — Telehealth: Payer: Self-pay

## 2023-03-20 NOTE — Telephone Encounter (Signed)
Pt returning call to schedule colonoscopy.

## 2023-03-23 NOTE — Telephone Encounter (Addendum)
Spoken to patient and inform that according to her records, patient is not due until 05/2028  Colonoscopy from Dr Mechele Collin on 06/21/2018  Impression on that colonoscopy as follows: internal hemorrhoids, the examination was otherwise normal. No specimens collected.

## 2023-03-27 LAB — CYTOLOGY - PAP
Comment: NEGATIVE
Diagnosis: NEGATIVE
High risk HPV: NEGATIVE

## 2023-04-07 ENCOUNTER — Encounter: Payer: Self-pay | Admitting: Internal Medicine

## 2023-04-07 DIAGNOSIS — Z1211 Encounter for screening for malignant neoplasm of colon: Secondary | ICD-10-CM

## 2023-04-08 NOTE — Telephone Encounter (Signed)
Spoke with patient. Referral placed.

## 2023-04-08 NOTE — Telephone Encounter (Signed)
Reviewed her chart. Per health maintenance note, due f/u colon 05/2023. Family hx of colon cancer and rectal polyp. See message below. Ok for new referral?

## 2023-04-08 NOTE — Telephone Encounter (Signed)
Ok to place referral with GI of her choice.  Thanks.

## 2023-04-17 ENCOUNTER — Ambulatory Visit (INDEPENDENT_AMBULATORY_CARE_PROVIDER_SITE_OTHER): Payer: 59

## 2023-04-17 DIAGNOSIS — M2011 Hallux valgus (acquired), right foot: Secondary | ICD-10-CM | POA: Diagnosis not present

## 2023-04-17 DIAGNOSIS — M069 Rheumatoid arthritis, unspecified: Secondary | ICD-10-CM

## 2023-04-17 DIAGNOSIS — M7752 Other enthesopathy of left foot: Secondary | ICD-10-CM | POA: Diagnosis not present

## 2023-04-17 DIAGNOSIS — T8484XA Pain due to internal orthopedic prosthetic devices, implants and grafts, initial encounter: Secondary | ICD-10-CM

## 2023-04-17 DIAGNOSIS — M205X1 Other deformities of toe(s) (acquired), right foot: Secondary | ICD-10-CM | POA: Diagnosis not present

## 2023-04-17 DIAGNOSIS — M21611 Bunion of right foot: Secondary | ICD-10-CM

## 2023-04-17 NOTE — Progress Notes (Signed)
Patient presents today to pick up custom molded foot orthotics, diagnosed with Hallux limitus, Valgus by Dr. Lilian Kapur .   Orthotics were dispensed and fit was satisfactory. Reviewed instructions for break-in and wear. Written instructions given to patient.  Patient will follow up as needed.   Addison Bailey Cped, CFo, CFm  Added charges for Right foot only as only Left was added in August

## 2023-05-01 ENCOUNTER — Other Ambulatory Visit: Payer: Self-pay | Admitting: Internal Medicine

## 2023-05-01 ENCOUNTER — Encounter: Payer: Self-pay | Admitting: Internal Medicine

## 2023-05-01 DIAGNOSIS — E042 Nontoxic multinodular goiter: Secondary | ICD-10-CM

## 2023-05-04 ENCOUNTER — Ambulatory Visit
Admission: RE | Admit: 2023-05-04 | Discharge: 2023-05-04 | Disposition: A | Discharge status: Home / Self Care | Payer: 59 | Source: Ambulatory Visit | Attending: Internal Medicine | Admitting: Internal Medicine

## 2023-05-04 DIAGNOSIS — E042 Nontoxic multinodular goiter: Secondary | ICD-10-CM

## 2023-05-14 ENCOUNTER — Encounter: Payer: Self-pay | Admitting: Internal Medicine

## 2023-05-14 DIAGNOSIS — I6523 Occlusion and stenosis of bilateral carotid arteries: Secondary | ICD-10-CM

## 2023-05-14 NOTE — Telephone Encounter (Signed)
Ultrasound results are in EPIC. Candidate for CT cardiac score?

## 2023-05-14 NOTE — Telephone Encounter (Signed)
Notify her that I would like to evaluate this further with a carotid ultrasound. If agreeable, let me know and I can order.

## 2023-05-15 NOTE — Telephone Encounter (Signed)
Order placed for carotid ultrasound.   

## 2023-05-25 ENCOUNTER — Ambulatory Visit: Payer: 59 | Admitting: Internal Medicine

## 2023-05-25 ENCOUNTER — Encounter: Payer: Self-pay | Admitting: Internal Medicine

## 2023-05-25 VITALS — BP 124/80 | HR 96 | Ht 61.0 in | Wt 138.0 lb

## 2023-05-25 DIAGNOSIS — E042 Nontoxic multinodular goiter: Secondary | ICD-10-CM

## 2023-05-25 DIAGNOSIS — Z8639 Personal history of other endocrine, nutritional and metabolic disease: Secondary | ICD-10-CM

## 2023-05-25 NOTE — Patient Instructions (Signed)
We will order a new thyroid biopsy.  Look up radiofrequency ablation (RFA).  You should have an endocrinology follow-up appointment in 1 year.

## 2023-05-25 NOTE — Progress Notes (Signed)
Patient ID: Tina Parker, female   DOB: 01/25/1971, 52 y.o.   MRN: [id number]  HPI  Tina Parker is a 52 y.o.-year-old female, initially referred by her PCP, Dr. Lorin Picket, returning for follow-up for thyroid nodules.  Last visit 1 year ago.  Interim history: She previously had neck pain, dysphagia, hoarseness.  However, the symptoms improved after losing almost 40 pounds on Wegovy before last visit. She still has some chocking (hiatal hernia) and discomfort laying on the R side or turning the head to the left. Her daughter was just dx'ed with papillary thy CA.  Reviewed and addended history: At 52 y/o, she had Graves ds. >> started on medication >> after she gave birth to her daughter in 20 >> developed hypothyroidism >> started LT4 >> continued for several years >> now off for "years".  Of note, she had a normal swallowing study in 2015, which only showed a small hiatal hernia.  Pt had a thyroid nodule palpated by PCP in 2020 >> sent for a thyroid U/S - this showed a L thyroid nodule:  Thyroid U/S (05/16/2019): Left inferior, isoechoic, 1.7 cm,  thyroid nodule      FNA of this nodule (07/11/2018): Benign  After the Bx, she developed dysphagia >> had EGD >> Es stretched.  Barium swallow (04/16/2020): Small sliding hiatal hernia. No evidence of esophageal stricture or extrinsic compression. Gastroesophageal reflux to level of midthoracic esophagus.  Thyroid U/S (04/18/2020): Parenchymal Echotexture: Normal Isthmus: 0.3 cm, previously 0.2 cm Right lobe: 3.5 x 1.0 x 1.3 cm, previously 3.5 x 0.9 x 1.2 cm Left lobe: 3.9 x 1.1 x 1.4 cm, previously 3.6 x 1.3 x 1.3 cm _________________________________________________________   Nodule # 1: Location: Right; Mid Maximum size: 0.6 cm; Other 2 dimensions: 0.5 x 0.4 cm Composition: solid/almost completely solid (2) Echogenicity: isoechoic (1) *Given size (<1.4 cm) and appearance, this nodule does NOT meet TI-RADS criteria for biopsy  or dedicated follow-up.  _________________________________________________________   Nodule # 2: Prior biopsy: No Location: Left; Mid Maximum size: 1.6 cm; Other 2 dimensions: 1.1 x 0.9 cm, previously, 1.7 x 1.3 x 0.9 cm Composition: solid/almost completely solid (2) Echogenicity: isoechoic (1)  *Given size (>/= 1.5 - 2.4 cm) and appearance, a follow-up ultrasound in 1 year should be considered based on TI-RADS criteria. _________________________________________________________   IMPRESSION: Similar appearing solid left thyroid nodule (labeled 2), again categorized as TI-RADS 3. Recommend annual ultrasound follow-up until 5 years of stability are established. This study marks 1 year stability.  Thyroid U/S (06/04/2021): Parenchymal Echotexture: Moderately heterogenous  Isthmus: 0.4 cm  Right lobe: 3.4 cm x 1.0 cm x 1.1 cm  Left lobe: 4.2 cm x 1.2 cm x 1.4 cm  _____________________________________________________________________   Nodule labeled 1 in the right mid thyroid, 6 mm, unchanged, and does not meet criteria for further surveillance.   Nodule labeled 2 in the left mid thyroid, previously 1.6 cm, currently measuring 2.0 cm. Nodule remains TR 3 characteristics and meets criteria for further surveillance.     No adenopathy   Recommendations follow those established by the new ACR TI-RADS criteria (J Am Coll Radiol 2017;14:587-595).   IMPRESSION: Left mid thyroid nodule meets criteria for continued surveillance, as designated by the newly established ACR TI-RADS criteria. Surveillance ultrasound study recommended to be performed annually up to 5 years.  Thyroid U/S (06/02/2022): Parenchymal Echotexture: Mildly heterogeneous  Isthmus: 0.3 cm  Right lobe: 4.0 x 0.8 x 0.9 cm  Left lobe: 4.0 x 1.1 x 1.6  cm  _________________________________________________________   Estimated total number of nodules >/= 1 cm:  1 _________________________________________________________   Nodule 1: 0.6 x 0.5 x 0.4 cm isoechoic solid right inferior thyroid nodule is unchanged from prior examination and does not meet criteria for imaging surveillance or FNA.  _________________________________________________________   Nodule # 2:  Prior biopsy: No  Location: Left; mid  Maximum size: 2.1 cm; Other 2 dimensions: 1.2 x 1.2 cm, previously, 2.0 x 1.4 x 0.8 cm  Composition: solid/almost completely solid (2)  Echogenicity: isoechoic (1) *Given size (>/= 1.5 - 2.4 cm) and appearance, a follow-up ultrasound in 1 year should be considered based on TI-RADS criteria.  _________________________________________________________   IMPRESSION: Nodule 2 (TI-RADS 3), measuring 2.1 cm, located in the mid left thyroid lobe is not significantly changed in size since prior examination from 04/16/2020 and still meets criteria for imaging follow-up. Annual ultrasound surveillance is recommended until 5 years of stability is documented.  Thyroid U/S (05/04/2023): Parenchymal Echotexture: Mildly heterogeneous  Isthmus: 0.2 cm  Right lobe: 3.7 x 1.0 x 1.1 cm  Left lobe: 3.7 x 1.5 x 1.4 cm  _________________________________________________________   Estimated total number of nodules >/= 1 cm: 1 _________________________________________________________   Nodules 1 and 2, located in the inferior right thyroid lobe, are subcentimeter and do not meet criteria for FNA or imaging follow-up.   Nodule 3: 2.3 x 1.1 x 1.6 cm solid isoechoic left mid thyroid nodule (TI-RADS 3) is not significantly changed in size since prior examination from 06/02/2022 where it measured 2.1 x 1.2 x 1.2 cm. It is minimally larger when compared to the 05/16/2019 examination where it measured 1.7 x 1.3 x 0.9 cm. This nodule still meets criteria for imaging surveillance.   Incidental note made of calcified atheromatous plaque of the left mid common carotid  artery.   IMPRESSION: Nodule 3 located in the mid left thyroid lobe (TI-RADS 3) is not significantly changed in size since prior examination from 06/02/2022, but is minimally larger compared to older examination from 05/16/2019. This nodule still meets criteria for imaging surveillance. Follow-up ultrasound recommended in 1 year.   She does have GERD - better after losing weight. Prev. On Dexilant >> had to come off 2/2 insurance coverage >> Omeprazole.  I reviewed pt's thyroid tests: Lab Results  Component Value Date   TSH 3.42 12/15/2022   TSH 1.73 01/27/2022   TSH 3.10 06/25/2021   TSH 2.38 02/27/2021   TSH 1.59 04/09/2020   TSH 1.97 04/25/2019   TSH 2.91 10/11/2018   TSH 2.76 12/21/2017   TSH 2.48 01/19/2017   TSH 1.71 03/17/2016   FREET4 0.63 04/09/2020   FREET4 0.71 12/21/2017    No FH of thyroid ds. + FH of thyroid cancer in M first cousin and daughter. No h/o radiation tx to head or neck. No steroid use. No herbal supplements. No Biotin supplements or Hair, Skin and Nails vitamins.  Pt also has a history of leukopenia, meningioma - surgically removed, HTN, HL, RA - on Actemra and MTX - but not working well, vitamin D deficiency. On B12.   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   Pure hypercholesterolemia    Rheumatoid arthritis(714.0)    positive anti CCP antibodies, positive RF, oligo-articular, MTX   Sciatica    Sleep apnea    Past Surgical History:  Procedure Laterality Date   ARTHRODESIS METATARSALPHALANGEAL JOINT (MTPJ) Right 06/27/2022   Procedure: ARTHRODESIS  METATARSALPHALANGEAL JOINT (MTPJ);  Surgeon: Edwin Cap, DPM;  Location: [facility] ORS;  Service: Podiatry;  Laterality: Right;   CARPAL TUNNEL RELEASE Bilateral    CESAREAN SECTION  1996   COLONOSCOPY  03/14/2002, 08/16/2007, 03/07/2013   [facility]-father   COLONOSCOPY WITH PROPOFOL N/A 06/21/2018   Procedure: COLONOSCOPY WITH PROPOFOL;  Surgeon: Scot Jun, MD;  Location: [facility];  Service: Endoscopy;  Laterality: N/A;   CRANIOTOMY Left 06/25/2017   Procedure: CRANIOTOMY TEMPORAL LEFT FOR TUMOR RESECTION;  Surgeon: Coletta Memos, MD;  Location: [facility] OR;  Service: Neurosurgery;  Laterality: Left;   ESOPHAGOGASTRODUODENOSCOPY  03/14/2002, 03/07/2013   ESOPHAGOGASTRODUODENOSCOPY (EGD) WITH PROPOFOL N/A 06/21/2018   Procedure: ESOPHAGOGASTRODUODENOSCOPY (EGD) WITH PROPOFOL;  Surgeon: Scot Jun, MD;  Location: [facility] END[facility]COPY;  Service: Endoscopy;  Laterality: N/A;   ESOPHAGOGASTRODUODENOSCOPY (EGD) WITH PROPOFOL N/A 07/15/2019   Procedure: ESOPHAGOGASTRODUODENOSCOPY (EGD) WITH PROPOFOL;  Surgeon: Wyline Mood, MD;  Location: [facility] ENDOSCOPY;  Service: Gastroenterology;  Laterality: N/A;   FOOT ARTHRODESIS Right 06/27/2022   Procedure: ARTHRODESIS FOOT;  Surgeon: Edwin Cap, DPM;  Location: [facility] ORS;  Service: Podiatry;  Laterality: Right;   Post Septoplasty and turbinate reduction  2007   TRIGGER FINGER RELEASE     Social History   Socioeconomic History   Marital status: Married    Spouse name: ERIC   Number of children: 2   Years of education: Not on file   Highest education level: Not on file  Occupational History   Occupation: Hairstylist  Tobacco Use   Smoking status: Never   Smokeless tobacco: Never  Vaping Use   Vaping status: Never Used  Substance and Sexual Activity   Alcohol use: Not Currently    Comment: wine cooler 2 drinks 2x a week/ OCCASIONALLY   Drug use: No   Sexual activity: Not on file  Other Topics Concern   Not on file  Social History Narrative    Hairdresser    Social Determinants of Health   Financial Resource Strain: Low Risk  (03/13/2021)   Overall Financial Resource Strain (CARDIA)    Difficulty of Paying Living Expenses: Not hard at all  Food Insecurity: No Food Insecurity (11/01/2020)   Hunger Vital Sign    Worried About Running Out of Food in the Last Year: Never true    Ran Out of Food in the Last Year: Never true  Transportation Needs: Not on file  Physical Activity: Insufficiently Active (11/01/2020)   Exercise Vital Sign    Days of Exercise per Week: 2 days    Minutes of Exercise per Session: 20 min  Stress: Not on file  Social Connections: Not on file  Intimate Partner Violence: Not on file   Current Outpatient Medications on File Prior to Visit  Medication Sig Dispense Refill   Azelastine-Fluticasone 137-50 MCG/ACT SUSP Place 1 spray into both nostrils as needed.     budesonide-formoterol (SYMBICORT) 80-4.5 MCG/ACT inhaler 1-2 puffs twice daily (rinse mouth after use) 1 each 5   calcium carbonate (OSCAL) 1500 (600 Ca) MG TABS  tablet Take 1 tablet by mouth daily.     citalopram (CELEXA) 40 MG tablet TAKE 1 TABLET(40 MG) BY MOUTH DAILY 30 tablet 5   desloratadine (CLARINEX) 5 MG tablet Take 5 mg by mouth daily.     folic acid (FOLVITE) 1 MG tablet Take 1 mg by mouth daily.     losartan (COZAAR) 100 MG tablet TAKE 1 TABLET BY MOUTH DAILY 90 tablet 1   methotrexate (RHEUMATREX) 2.5 MG tablet Take 22.5 mg by mouth every Monday. Caution:Chemotherapy. Protect from light. Take 9 tabs po weekly with meals     RINVOQ 15 MG TB24 Take 1 tablet by mouth daily.     Semaglutide-Weight Management (WEGOVY) 1 MG/0.5ML SOAJ Inject 1 mg into the skin once a week. 2 mL 4   valACYclovir (VALTREX) 500 MG tablet TAKE 1 TABLET BY MOUTH TWICE DAILY FOR 3 DAYS. CONTINUE 1 BY MOUTH EVERY DAY 30 tablet 2   vitamin B-12 (CYANOCOBALAMIN) 100 MCG tablet Take 100 mcg by mouth daily.     Current Facility-Administered Medications on File Prior to  Visit  Medication Dose Route Frequency Provider Last Rate Last Admin   betamethasone acetate-betamethasone sodium phosphate (CELESTONE) injection 12 mg  12 mg Other Once Tyrell Antonio, MD       Allergies  Allergen Reactions   Cephalexin Shortness Of Breath   Inh [Isoniazid] Other (See Comments)    Causes hepatitis   Oxycodone Shortness Of Breath   Family History  Problem Relation Age of Onset   Cancer Mother        Breast Cancer   Breast cancer Mother 84   Cancer Father        Colon Cancer   Heart disease Father        Hx of MI   Hypertension Father    Diabetes Father    Hyperlipidemia Father    Cancer Maternal Grandmother        lung cancer   Cancer Maternal Uncle        esophageal   PE: BP 124/80   Pulse 96   Ht 5\' 1"  (1.549 m)   Wt 138 lb (62.6 kg)   SpO2 96%   BMI 26.07 kg/m  Wt Readings from Last 3 Encounters:  05/25/23 138 lb (62.6 kg)  03/19/23 133 lb 9.6 oz (60.6 kg)  12/15/22 137 lb 9.6 oz (62.4 kg)   Constitutional: normal weight, in NAD Eyes:  EOMI, no exophthalmos ENT: no neck masses, no cervical lymphadenopathy Cardiovascular: tachycardia, RR, No MRG Respiratory: CTA B Musculoskeletal: no deformities Skin:no rashes Neurological: L tremor with outstretched hand  ASSESSMENT: 1.  Thyroid nodules  2.  History of Graves' disease  PLAN: 1.  Thyroid nodules -Patient has 2 thyroid nodules, 1 subcentimeter in the right thyroid lobe not worrisome, and 1 larger, in the left thyroid lobe, which was previously biopsied with benign results in 06/2018.  This nodule was isoechoic and without microcalcifications, internal blood flow, taller than wide disposition, irregular margins.  On the ultrasound from 2022, the nodule appears to be slightly larger, measuring 2 cm, compared to previously 1.6 cm.  The recommendation was for continuing follow-up.  We recheck an ultrasound after last visit and the nodule measured 2.1 cm.  Right before today's visit, we repeated  another ultrasound (05/04/2023) and the nodule size was slightly larger, at 2.3 cm, still meeting criteria for 1 year follow-up. -She had a barium swallow study that did not show external compression of the esophagus from  the thyroid nodule -Of note, she tells me that her daughter was just diagnosed with papillary thyroid cancer so she now has a family history of thyroid cancer in 2 members.  She does not have a personal history of radiation therapy to head or neck, favoring benignity -At last visit and again today, she described neck pressure and discomfort when laying on the right side.  She also has discomfort when turning her head to the left. -At today's visit, we discussed about options for treatment.  She is worried that if she continues with only expectant management, her neck discomfort will get worse.  She would like to avoid lobectomy due to the risk of hypothyroidism.  Another option is radiofrequency ablation, for which I feel she would be a good candidate, but I am not aware of the procedure being done here in [place].  I will look into this.  She also feels that this would be a good option for her. She agrees to go to [facility] if necessary.  However, we absolutely need to make sure that her nodule is not cancerous before this procedure and she would like to have another biopsy performed.  I ordered this today.  We discussed that if we cannot do the procedure it is possible that the nodule size will plateau in the next few years especially after 2 biopsies.  Plan to repeat another ultrasound in 1 year in that case -I plan to see her back in 1 year or sooner, depending on the results of  2.  History of Graves' disease -In remission -Latest TSH was normal in 11/2022 -She still has left hand tremor as sequela of her Graves' disease -She has tachycardia and higher blood pressure, but she attributes this to whitecoat hypertension.  Usually blood pressure is better if repeated at the end of the  visit. -No signs of active Graves' ophthalmopathy: Double vision, blurry vision, eye pain, chemosis  Orders Placed This Encounter  Procedures   [place] FNA BX THYROID 1ST LESION AFIRMA   Carlus Pavlov, MD PhD [facility] Endocrinology

## 2023-06-02 ENCOUNTER — Ambulatory Visit (INDEPENDENT_AMBULATORY_CARE_PROVIDER_SITE_OTHER): Payer: 59

## 2023-06-02 DIAGNOSIS — I6523 Occlusion and stenosis of bilateral carotid arteries: Secondary | ICD-10-CM | POA: Diagnosis not present

## 2023-06-08 ENCOUNTER — Encounter: Payer: Self-pay | Admitting: Internal Medicine

## 2023-06-09 NOTE — Telephone Encounter (Signed)
I was not sure if final results were in chart or not. Wanted to confirm with you

## 2023-06-09 NOTE — Telephone Encounter (Signed)
Please let her know that the carotid ultrasound results just became available today.  See result note.  No significant blockage.  We will follow.

## 2023-06-25 ENCOUNTER — Inpatient Hospital Stay: Admission: RE | Admit: 2023-06-25 | Payer: 59 | Source: Ambulatory Visit

## 2023-06-29 ENCOUNTER — Encounter: Payer: Self-pay | Admitting: Internal Medicine

## 2023-07-20 ENCOUNTER — Ambulatory Visit: Payer: 59 | Admitting: Internal Medicine

## 2023-07-20 ENCOUNTER — Encounter: Payer: Self-pay | Admitting: Internal Medicine

## 2023-07-20 VITALS — BP 136/74 | HR 82 | Temp 98.2°F | Resp 16 | Ht 61.0 in | Wt 136.2 lb

## 2023-07-20 DIAGNOSIS — F419 Anxiety disorder, unspecified: Secondary | ICD-10-CM

## 2023-07-20 DIAGNOSIS — E039 Hypothyroidism, unspecified: Secondary | ICD-10-CM | POA: Diagnosis not present

## 2023-07-20 DIAGNOSIS — E042 Nontoxic multinodular goiter: Secondary | ICD-10-CM

## 2023-07-20 DIAGNOSIS — R102 Pelvic and perineal pain: Secondary | ICD-10-CM

## 2023-07-20 DIAGNOSIS — E7800 Pure hypercholesterolemia, unspecified: Secondary | ICD-10-CM

## 2023-07-20 DIAGNOSIS — M059 Rheumatoid arthritis with rheumatoid factor, unspecified: Secondary | ICD-10-CM

## 2023-07-20 DIAGNOSIS — R739 Hyperglycemia, unspecified: Secondary | ICD-10-CM | POA: Diagnosis not present

## 2023-07-20 DIAGNOSIS — K5900 Constipation, unspecified: Secondary | ICD-10-CM | POA: Insufficient documentation

## 2023-07-20 DIAGNOSIS — Z9109 Other allergy status, other than to drugs and biological substances: Secondary | ICD-10-CM

## 2023-07-20 DIAGNOSIS — Z1231 Encounter for screening mammogram for malignant neoplasm of breast: Secondary | ICD-10-CM

## 2023-07-20 DIAGNOSIS — K219 Gastro-esophageal reflux disease without esophagitis: Secondary | ICD-10-CM

## 2023-07-20 DIAGNOSIS — I10 Essential (primary) hypertension: Secondary | ICD-10-CM

## 2023-07-20 MED ORDER — PANTOPRAZOLE SODIUM 40 MG PO TBEC: 40.0000 mg | DELAYED_RELEASE_TABLET | Freq: Every day | ORAL | 2 refills | Status: DC

## 2023-07-20 MED ORDER — BUSPIRONE HCL 5 MG PO TABS: 5.0000 mg | ORAL_TABLET | Freq: Every day | ORAL | 1 refills | Status: DC

## 2023-07-20 NOTE — Assessment & Plan Note (Signed)
Continue citalopram.  Increased stress as outlined.  Add benefiber 5mg . Follow.

## 2023-07-20 NOTE — Assessment & Plan Note (Signed)
The 10-year ASCVD risk score (Arnett DK, et al., 2019) is: 4.6%   Values used to calculate the score:     Age: 53 years     Sex: Female     Is Non-Hispanic African American: Yes     Diabetic: No     Tobacco smoker: No     Systolic Blood Pressure: 136 mmHg     Is BP treated: Yes     HDL Cholesterol: 80.3 mg/dL     Total Cholesterol: 245 mg/dL  Low cholesterol diet and exercise.  Follow lipid panel.

## 2023-07-20 NOTE — Assessment & Plan Note (Signed)
Minimal.  Benefiber daily.

## 2023-07-20 NOTE — Progress Notes (Signed)
Subjective:    Patient ID: Tina Parker, female    DOB: 1971-03-27, 53 y.o.   MRN: [id number]  Patient here for  Chief Complaint  Patient presents with   Medical Management of Chronic Issues    HPI Here for a scheduled follow up - follow up regarding increased stress, hypercholesterolemia, hypertension and GERD. Saw Dr Elvera Lennox 05/25/23- f/u thyroid nodules. F/u ultrasound - nodule 2.3cm. Scheduled for biopsy tomorrow. Her daughter was just diagnosed with papillary thyroid cancer. Has a family history of thyroid cancer - cousin with papillary cancer. Discussed wegovy.  Increased anxiety with all of the above. Feels needs something more to help level things out. Breathing stable. She is having some increased acid reflux. Had some vomiting and diarrhea - two weeks ago. Since has noticed irritation throat. Discussed PPI for short term to help with this and acid reflux. Also reports some suprapubic pressure. Some increased urinary frequency (? Minimal). No vaginal lesions or intravaginal symptoms.   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   Pure hypercholesterolemia    Rheumatoid arthritis(714.0)    positive anti CCP antibodies, positive RF, oligo-articular, MTX   Sciatica    Sleep apnea    Past Surgical History:  Procedure Laterality Date   ARTHRODESIS METATARSALPHALANGEAL JOINT (MTPJ) Right 06/27/2022   Procedure: ARTHRODESIS METATARSALPHALANGEAL JOINT (MTPJ);  Surgeon: Edwin Cap, DPM;  Location: [facility] ORS;  Service: Podiatry;  Laterality: Right;   CARPAL TUNNEL RELEASE Bilateral    CESAREAN SECTION  1996    COLONOSCOPY  03/14/2002, 08/16/2007, 03/07/2013   [facility]-father   COLONOSCOPY WITH PROPOFOL N/A 06/21/2018   Procedure: COLONOSCOPY WITH PROPOFOL;  Surgeon: Scot Jun, MD;  Location: [facility] ENDOSCOPY;  Service: Endoscopy;  Laterality: N/A;   CRANIOTOMY Left 06/25/2017   Procedure: CRANIOTOMY TEMPORAL LEFT FOR TUMOR RESECTION;  Surgeon: Coletta Memos, MD;  Location: [facility] OR;  Service: Neurosurgery;  Laterality: Left;   ESOPHAGOGASTRODUODENOSCOPY  03/14/2002, 03/07/2013   ESOPHAGOGASTRODUODENOSCOPY (EGD) WITH PROPOFOL N/A 06/21/2018   Procedure: ESOPHAGOGASTRODUODENOSCOPY (EGD) WITH PROPOFOL;  Surgeon: Scot Jun, MD;  Location: [facility] ENDOSCOPY;  Service: Endoscopy;  Laterality: N/A;   ESOPHAGOGASTRODUODENOSCOPY (EGD) WITH PROPOFOL N/A 07/15/2019   Procedure: ESOPHAGOGASTRODUODENOSCOPY (EGD) WITH PROPOFOL;  Surgeon: Wyline Mood, MD;  Location: [facility] ENDOSCOPY;  Service: Gastroenterology;  Laterality: N/A;   FOOT ARTHRODESIS Right 06/27/2022   Procedure: ARTHRODESIS FOOT;  Surgeon: Edwin Cap, DPM;  Location: [facility] ORS;  Service: Podiatry;  Laterality: Right;   Post Septoplasty and turbinate reduction  2007   TRIGGER FINGER RELEASE     Family History  Problem Relation Age of Onset   Cancer Mother        Breast Cancer   Breast cancer Mother 21   Cancer Father        Colon Cancer   Heart disease Father        Hx of MI   Hypertension Father    Diabetes Father    Hyperlipidemia Father    Cancer Maternal Grandmother  lung cancer   Cancer Maternal Uncle        esophageal   Social History   Socioeconomic History   Marital status: Married    Spouse name: ERIC   Number of children: 2   Years of education: Not on file   Highest education level: Not on file  Occupational History   Occupation: Hairstylist  Tobacco Use   Smoking status: Never   Smokeless tobacco: Never  Vaping Use   Vaping status: Never Used  Substance and Sexual Activity   Alcohol use: Not Currently     Comment: wine cooler 2 drinks 2x a week/ OCCASIONALLY   Drug use: No   Sexual activity: Not on file  Other Topics Concern   Not on file  Social History Narrative   Hairdresser    Social Drivers of Health   Financial Resource Strain: Low Risk  (03/13/2021)   Overall Financial Resource Strain (CARDIA)    Difficulty of Paying Living Expenses: Not hard at all  Food Insecurity: No Food Insecurity (11/01/2020)   Hunger Vital Sign    Worried About Running Out of Food in the Last Year: Never true    Ran Out of Food in the Last Year: Never true  Transportation Needs: Not on file  Physical Activity: Insufficiently Active (11/01/2020)   Exercise Vital Sign    Days of Exercise per Week: 2 days    Minutes of Exercise per Session: 20 min  Stress: Not on file  Social Connections: Not on file     Review of Systems  Constitutional:  Negative for appetite change and unexpected weight change.  HENT:  Negative for congestion and sinus pressure.   Respiratory:  Negative for cough, chest tightness and shortness of breath.   Cardiovascular:  Negative for chest pain, palpitations and leg swelling.  Gastrointestinal:  Negative for abdominal pain, diarrhea, nausea and vomiting.  Genitourinary:  Positive for frequency. Negative for dysuria.       Suprapubic pressure.   Musculoskeletal:  Negative for joint swelling and myalgias.  Skin:  Negative for color change and rash.  Neurological:  Negative for dizziness and headaches.  Psychiatric/Behavioral:  Negative for agitation.        Increased stress and anxiety as outlined.        Objective:     BP 136/74   Pulse 82   Temp 98.2 F (36.8 C)   Resp 16   Ht 5\' 1"  (1.549 m)   Wt 136 lb 3.2 oz (61.8 kg)   SpO2 98%   BMI 25.73 kg/m  Wt Readings from Last 3 Encounters:  07/20/23 136 lb 3.2 oz (61.8 kg)  05/25/23 138 lb (62.6 kg)  03/19/23 133 lb 9.6 oz (60.6 kg)    Physical Exam Vitals reviewed.  Constitutional:      General: She is not in  acute distress.    Appearance: Normal appearance.  HENT:     Head: Normocephalic and atraumatic.     Right Ear: External ear normal.     Left Ear: External ear normal.     Mouth/Throat:     Pharynx: No oropharyngeal exudate or posterior oropharyngeal erythema.  Eyes:     General: No scleral icterus.       Right eye: No discharge.        Left eye: No discharge.     Conjunctiva/sclera: Conjunctivae normal.  Neck:     Thyroid: No thyromegaly.  Cardiovascular:     Rate and Rhythm: Normal rate  and regular rhythm.  Pulmonary:     Effort: No respiratory distress.     Breath sounds: Normal breath sounds. No wheezing.  Abdominal:     General: Bowel sounds are normal.     Palpations: Abdomen is soft.     Tenderness: There is no abdominal tenderness.  Musculoskeletal:        General: No swelling or tenderness.     Cervical back: Neck supple. No tenderness.  Lymphadenopathy:     Cervical: No cervical adenopathy.  Skin:    Findings: No erythema or rash.  Neurological:     Mental Status: She is alert.  Psychiatric:        Mood and Affect: Mood normal.        Behavior: Behavior normal.         Outpatient Encounter Medications as of 07/20/2023  Medication Sig   busPIRone (BUSPAR) 5 MG tablet Take 1 tablet (5 mg total) by mouth daily.   pantoprazole (PROTONIX) 40 MG tablet Take 1 tablet (40 mg total) by mouth daily.   Azelastine-Fluticasone 137-50 MCG/ACT SUSP Place 1 spray into both nostrils as needed.   budesonide-formoterol (SYMBICORT) 80-4.5 MCG/ACT inhaler 1-2 puffs twice daily (rinse mouth after use)   calcium carbonate (OSCAL) 1500 (600 Ca) MG TABS tablet Take 1 tablet by mouth daily.   citalopram (CELEXA) 40 MG tablet TAKE 1 TABLET(40 MG) BY MOUTH DAILY   desloratadine (CLARINEX) 5 MG tablet Take 5 mg by mouth daily.   folic acid (FOLVITE) 1 MG tablet Take 1 mg by mouth daily.   losartan (COZAAR) 100 MG tablet TAKE 1 TABLET BY MOUTH DAILY   methotrexate (RHEUMATREX) 2.5 MG  tablet Take 22.5 mg by mouth every Monday. Caution:Chemotherapy. Protect from light. Take 9 tabs po weekly with meals   RINVOQ 15 MG TB24 Take 1 tablet by mouth daily.   Semaglutide-Weight Management (WEGOVY) 1 MG/0.5ML SOAJ Inject 1 mg into the skin once a week.   valACYclovir (VALTREX) 500 MG tablet TAKE 1 TABLET BY MOUTH TWICE DAILY FOR 3 DAYS. CONTINUE 1 BY MOUTH EVERY DAY   vitamin B-12 (CYANOCOBALAMIN) 100 MCG tablet Take 100 mcg by mouth daily.   Facility-Administered Encounter Medications as of 07/20/2023  Medication   betamethasone acetate-betamethasone sodium phosphate (CELESTONE) injection 12 mg     Lab Results  Component Value Date   WBC 4.5 03/19/2023   HGB 11.6 (L) 03/19/2023   HCT 35.0 (L) 03/19/2023   PLT 338.0 03/19/2023   GLUCOSE 82 03/19/2023   CHOL 245 (H) 03/19/2023   TRIG 49.0 03/19/2023   HDL 80.30 03/19/2023   LDLCALC 155 (H) 03/19/2023   ALT 13 03/19/2023   AST 23 03/19/2023   NA 139 03/19/2023   K 4.3 03/19/2023   CL 103 03/19/2023   CREATININE 0.88 03/19/2023   BUN 9 03/19/2023   CO2 30 03/19/2023   TSH 3.42 12/15/2022   HGBA1C 5.3 03/19/2023    US THYROID Result Date: 05/13/2023 CLINICAL DATA:  Thyroid nodule follow-up EXAM: THYROID ULTRASOUND TECHNIQUE: Ultrasound examination of the thyroid gland and adjacent soft tissues was performed. COMPARISON:  06/02/2022 FINDINGS: Parenchymal Echotexture: Mildly heterogeneous Isthmus: 0.2 cm Right lobe: 3.7 x 1.0 x 1.1 cm Left lobe: 3.7 x 1.5 x 1.4 cm _________________________________________________________ Estimated total number of nodules >/= 1 cm: 1 Number of spongiform nodules >/=  2 cm not described below (TR1): 0 Number of mixed cystic and solid nodules >/= 1.5 cm not described below (TR2): 0 _________________________________________________________ Nodules 1  and 2, located in the inferior right thyroid lobe, are subcentimeter and do not meet criteria for FNA or imaging follow-up. Nodule 3: 2.3 x 1.1 x 1.6  cm solid isoechoic left mid thyroid nodule (TI-RADS 3) is not significantly changed in size since prior examination from 06/02/2022 where it measured 2.1 x 1.2 x 1.2 cm. It is minimally larger when compared to the 05/16/2019 examination where it measured 1.7 x 1.3 x 0.9 cm. This nodule still meets criteria for imaging surveillance. Incidental note made of calcified atheromatous plaque of the left mid common carotid artery. IMPRESSION: Nodule 3 located in the mid left thyroid lobe (TI-RADS 3) is not significantly changed in size since prior examination from 06/02/2022, but is minimally larger compared to older examination from 05/16/2019. This nodule still meets criteria for imaging surveillance. Follow-up ultrasound recommended in 1 year. The above is in keeping with the ACR TI-RADS recommendations - J Am Coll Radiol 2017;14:587-595. Electronically Signed   By: Acquanetta Belling M.D.   On: 05/13/2023 07:56       Assessment & Plan:  Rheumatoid arthritis with positive rheumatoid factor, involving unspecified site [facility]) Assessment & Plan: Followed by rheumatology. On MTX.  Also receiving rinvoq.  Stable.     Orders: -     CBC with Differential/Platelet  Hypercholesterolemia Assessment & Plan: The 10-year ASCVD risk score (Arnett DK, et al., 2019) is: 4.6%   Values used to calculate the score:     Age: 61 years     Sex: Female     Is Non-Hispanic African American: Yes     Diabetic: No     Tobacco smoker: No     Systolic Blood Pressure: 136 mmHg     Is BP treated: Yes     HDL Cholesterol: 80.3 mg/dL     Total Cholesterol: 245 mg/dL  Low cholesterol diet and exercise.  Follow lipid panel.   Orders: -     Basic metabolic panel -     Hepatic function panel -     Lipid panel  Hyperglycemia Assessment & Plan: Low carb diet and exercise.  Follow met b and a1c.   Orders: -     Hemoglobin A1c  Hypothyroidism, unspecified type Assessment & Plan: On thyroid replacement.  Follow tsh.    Visit  for screening mammogram -     3D Screening Mammogram, Left and Right; Future  Suprapubic pain -     Urine Culture -     Urinalysis, Routine w reflex microscopic  Anxiety Assessment & Plan: Continue citalopram.  Increased stress as outlined.  Add benefiber 5mg . Follow.    Environmental allergies Assessment & Plan: Continue current allergy regimen.  Has rescue inhaler if needed.  Follow.    Essential hypertension, benign Assessment & Plan: On amlodipine and losartan.  Pressure as outlined.  Amlodipine -  5mg  q day. Follow pressures.  Follow metabolic panel.    Gastroesophageal reflux disease, unspecified whether esophagitis present Assessment & Plan: Reflux as outlined.  Protonix as directed.  Trial short term - see if acid reflux resolves and confirm burning subsides.  Follow.    Multiple thyroid nodules Assessment & Plan:  Saw Dr Elvera Lennox 05/25/23- f/u thyroid nodules. F/u ultrasound - nodule 2.3cm. Scheduled for biopsy tomorrow. Her daughter was just diagnosed with papillary thyroid cancer. Has a family history of thyroid cancer - cousin with papillary cancer. Discussed wegovy.  Will hold until biopsy results available. (Discussed difference - follicular vs papillary).    Constipation,  unspecified constipation type Assessment & Plan: Minimal.  Benefiber daily.    Suprapubic pressure Assessment & Plan: Check urine to confirm no infection.  Symptoms just started.  Feels similar to previous infection.    Other orders -     busPIRone HCl; Take 1 tablet (5 mg total) by mouth daily.  Dispense: 30 tablet; Refill: 1 -     Pantoprazole Sodium; Take 1 tablet (40 mg total) by mouth daily.  Dispense: 30 tablet; Refill: 2     [place], MD

## 2023-07-20 NOTE — Assessment & Plan Note (Signed)
Low carb diet and exercise.  Follow met b and a1c.   

## 2023-07-20 NOTE — Assessment & Plan Note (Signed)
Check urine to confirm no infection.  Symptoms just started.  Feels similar to previous infection.

## 2023-07-20 NOTE — Assessment & Plan Note (Signed)
On amlodipine and losartan.  Pressure as outlined.  Amlodipine -  5mg  q day. Follow pressures.  Follow metabolic panel.

## 2023-07-20 NOTE — Assessment & Plan Note (Signed)
Reflux as outlined.  Protonix as directed.  Trial short term - see if acid reflux resolves and confirm burning subsides.  Follow.

## 2023-07-20 NOTE — Assessment & Plan Note (Signed)
Followed by rheumatology. On MTX.  Also receiving rinvoq.  Stable.  ?

## 2023-07-20 NOTE — Patient Instructions (Signed)
Benefiber - daily 

## 2023-07-20 NOTE — Assessment & Plan Note (Signed)
Continue current allergy regimen.  Has rescue inhaler if needed.  Follow.  

## 2023-07-20 NOTE — Assessment & Plan Note (Signed)
Saw Dr Elvera Lennox 05/25/23- f/u thyroid nodules. F/u ultrasound - nodule 2.3cm. Scheduled for biopsy tomorrow. Her daughter was just diagnosed with papillary thyroid cancer. Has a family history of thyroid cancer - cousin with papillary cancer. Discussed wegovy.  Will hold until biopsy results available. (Discussed difference - follicular vs papillary).

## 2023-07-20 NOTE — Assessment & Plan Note (Signed)
On thyroid replacement.  Follow tsh.  

## 2023-07-21 ENCOUNTER — Ambulatory Visit
Admission: RE | Admit: 2023-07-21 | Discharge: 2023-07-21 | Disposition: A | Discharge status: Home / Self Care | Payer: 59 | Source: Ambulatory Visit | Attending: Internal Medicine | Admitting: Internal Medicine

## 2023-07-21 ENCOUNTER — Other Ambulatory Visit (HOSPITAL_COMMUNITY)
Admission: RE | Admit: 2023-07-21 | Discharge: 2023-07-21 | Disposition: A | Discharge status: Home / Self Care | Payer: 59 | Source: Ambulatory Visit | Attending: Interventional Radiology | Admitting: Interventional Radiology

## 2023-07-21 DIAGNOSIS — E042 Nontoxic multinodular goiter: Secondary | ICD-10-CM

## 2023-07-21 LAB — HEPATIC FUNCTION PANEL
ALT: 14 U/L (ref 0–35)
AST: 28 U/L (ref 0–37)
Albumin: 4.2 g/dL (ref 3.5–5.2)
Alkaline Phosphatase: 97 U/L (ref 39–117)
Bilirubin, Direct: 0.1 mg/dL (ref 0.0–0.3)
Total Bilirubin: 0.4 mg/dL (ref 0.2–1.2)
Total Protein: 7.7 g/dL (ref 6.0–8.3)

## 2023-07-21 LAB — LIPID PANEL
Cholesterol: 234 mg/dL — ABNORMAL HIGH (ref 0–200)
HDL: 70.6 mg/dL (ref >39.00)
LDL Cholesterol: 148 mg/dL — ABNORMAL HIGH (ref 0–99)
NonHDL: 163.13
Total CHOL/HDL Ratio: 3
Triglycerides: 74 mg/dL (ref 0.0–149.0)
VLDL: 14.8 mg/dL (ref 0.0–40.0)

## 2023-07-21 LAB — URINALYSIS, ROUTINE W REFLEX MICROSCOPIC
Bilirubin Urine: NEGATIVE
Ketones, ur: NEGATIVE
Leukocytes,Ua: NEGATIVE
Nitrite: NEGATIVE
Specific Gravity, Urine: 1.01 (ref 1.000–1.030)
Total Protein, Urine: NEGATIVE
Urine Glucose: NEGATIVE
Urobilinogen, UA: 0.2 (ref 0.0–1.0)
pH: 7 (ref 5.0–8.0)

## 2023-07-21 LAB — CBC WITH DIFFERENTIAL/PLATELET
Basophils Absolute: 0 10*3/uL (ref 0.0–0.1)
Basophils Relative: 0.5 % (ref 0.0–3.0)
Eosinophils Absolute: 0 10*3/uL (ref 0.0–0.7)
Eosinophils Relative: 0.7 % (ref 0.0–5.0)
HCT: 34.9 % — ABNORMAL LOW (ref 36.0–46.0)
Hemoglobin: 11.8 g/dL — ABNORMAL LOW (ref 12.0–15.0)
Lymphocytes Relative: 29.7 % (ref 12.0–46.0)
Lymphs Abs: 1.7 10*3/uL (ref 0.7–4.0)
MCHC: 33.8 g/dL (ref 30.0–36.0)
MCV: 94.2 fL (ref 78.0–100.0)
Monocytes Absolute: 0.4 10*3/uL (ref 0.1–1.0)
Monocytes Relative: 7.9 % (ref 3.0–12.0)
Neutro Abs: 3.4 10*3/uL (ref 1.4–7.7)
Neutrophils Relative %: 61.2 % (ref 43.0–77.0)
Platelets: 343 10*3/uL (ref 150.0–400.0)
RBC: 3.71 Mil/uL — ABNORMAL LOW (ref 3.87–5.11)
RDW: 13.3 % (ref 11.5–15.5)
WBC: 5.6 10*3/uL (ref 4.0–10.5)

## 2023-07-21 LAB — BASIC METABOLIC PANEL
BUN: 13 mg/dL (ref 6–23)
CO2: 28 meq/L (ref 19–32)
Calcium: 9.7 mg/dL (ref 8.4–10.5)
Chloride: 101 meq/L (ref 96–112)
Creatinine, Ser: 0.87 mg/dL (ref 0.40–1.20)
GFR: 75.58 mL/min (ref >60.00)
Glucose, Bld: 80 mg/dL (ref 70–99)
Potassium: 4.4 meq/L (ref 3.5–5.1)
Sodium: 135 meq/L (ref 135–145)

## 2023-07-21 LAB — URINE CULTURE
MICRO NUMBER:: 15977015
Result:: NO GROWTH
SPECIMEN QUALITY:: ADEQUATE

## 2023-07-21 LAB — HEMOGLOBIN A1C: Hgb A1c MFr Bld: 5.6 % (ref 4.6–6.5)

## 2023-07-23 LAB — CYTOLOGY - NON PAP

## 2023-07-24 ENCOUNTER — Encounter: Payer: Self-pay | Admitting: Internal Medicine

## 2023-08-03 ENCOUNTER — Other Ambulatory Visit: Payer: Self-pay | Admitting: Internal Medicine

## 2023-08-26 ENCOUNTER — Ambulatory Visit: Payer: 59 | Admitting: Internal Medicine

## 2023-08-26 ENCOUNTER — Encounter: Payer: Self-pay | Admitting: Internal Medicine

## 2023-08-26 VITALS — BP 122/70 | HR 90 | Temp 98.0°F | Resp 16 | Ht 61.0 in | Wt 139.4 lb

## 2023-08-26 DIAGNOSIS — M059 Rheumatoid arthritis with rheumatoid factor, unspecified: Secondary | ICD-10-CM | POA: Diagnosis not present

## 2023-08-26 DIAGNOSIS — M549 Dorsalgia, unspecified: Secondary | ICD-10-CM

## 2023-08-26 DIAGNOSIS — I10 Essential (primary) hypertension: Secondary | ICD-10-CM

## 2023-08-26 NOTE — Assessment & Plan Note (Signed)
 Left upper back pain/pain - scapula/adjacent - reproducible on exam. Lungs clear. Has flexeril. Continue - one q hs. Hold prednisone. Start with MR. Call with update. Xray at [facility] - per report - ok. Obtain results.

## 2023-08-26 NOTE — Assessment & Plan Note (Signed)
-

## 2023-08-26 NOTE — Progress Notes (Signed)
 Subjective:    Patient ID: Tina Parker, female    DOB: 1970/11/15, 53 y.o.   MRN: [id number]  Patient here for  Chief Complaint  Patient presents with   Shoulder Pain    HPI Work in appt - work in for upper back pain/left posterior shoulder pain.  Sees rheumatology for RA. Last evaluated 08/10/23 - on rinvoq and plaquenil and Methotrexate. Was having hand stiffness, etc. Advised to increase plaquenil dose. States the discomfort started a few weeks ago. Describes spurts of discomfort. Does wake up her up on occasions. Frequency has increased and intensity has increased some. Went to [facility]. Given prednisone and flexeril. Took a dose of flexeril last night. Has not started prednisone. Did see Dr Amanda Pea today. Injection in hand. No pain with deep breathing. No sob. No chest pain. No increased cough.    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   Pure hypercholesterolemia    Rheumatoid arthritis(714.0)    positive anti CCP antibodies, positive RF, oligo-articular, MTX   Sciatica    Sleep apnea    Past Surgical History:  Procedure Laterality Date   ARTHRODESIS METATARSALPHALANGEAL JOINT (MTPJ) Right 06/27/2022   Procedure: ARTHRODESIS METATARSALPHALANGEAL JOINT (MTPJ);  Surgeon: Edwin Cap, DPM;  Location: [facility] ORS;  Service: Podiatry;  Laterality: Right;   CARPAL TUNNEL RELEASE Bilateral    CESAREAN SECTION  1996   COLONOSCOPY  03/14/2002, 08/16/2007, 03/07/2013   [facility]-father   COLONOSCOPY WITH PROPOFOL N/A 06/21/2018   Procedure: COLONOSCOPY WITH PROPOFOL;  Surgeon: Scot Jun, MD;  Location: [facility]  ENDOSCOPY;  Service: Endoscopy;  Laterality: N/A;   CRANIOTOMY Left 06/25/2017   Procedure: CRANIOTOMY TEMPORAL LEFT FOR TUMOR RESECTION;  Surgeon: Coletta Memos, MD;  Location: [facility] OR;  Service: Neurosurgery;  Laterality: Left;   ESOPHAGOGASTRODUODENOSCOPY  03/14/2002, 03/07/2013   ESOPHAGOGASTRODUODENOSCOPY (EGD) WITH PROPOFOL N/A 06/21/2018   Procedure: ESOPHAGOGASTRODUODENOSCOPY (EGD) WITH PROPOFOL;  Surgeon: Scot Jun, MD;  Location: [facility] ENDOSCOPY;  Service: Endoscopy;  Laterality: N/A;   ESOPHAGOGASTRODUODENOSCOPY (EGD) WITH PROPOFOL N/A 07/15/2019   Procedure: ESOPHAGOGASTRODUODENOSCOPY (EGD) WITH PROPOFOL;  Surgeon: Wyline Mood, MD;  Location: [facility] ENDOSCOPY;  Service: Gastroenterology;  Laterality: N/A;   FOOT ARTHRODESIS Right 06/27/2022   Procedure: ARTHRODESIS FOOT;  Surgeon: Edwin Cap, DPM;  Location: [facility] ORS;  Service: Podiatry;  Laterality: Right;   Post Septoplasty and turbinate reduction  2007   TRIGGER FINGER RELEASE     Family History  Problem Relation Age of Onset   Cancer Mother        Breast Cancer   Breast cancer Mother 56   Cancer Father        Colon Cancer   Heart disease Father        Hx of MI   Hypertension Father    Diabetes Father    Hyperlipidemia Father    Cancer Maternal Grandmother        lung cancer   Cancer Maternal Uncle        esophageal  Social History   Socioeconomic History   Marital status: Married    Spouse name: ERIC   Number of children: 2   Years of education: Not on file   Highest education level: Not on file  Occupational History   Occupation: Hairstylist  Tobacco Use   Smoking status: Never   Smokeless tobacco: Never  Vaping Use   Vaping status: Never Used  Substance and Sexual Activity   Alcohol use: Not Currently    Comment: wine cooler 2 drinks 2x a week/ OCCASIONALLY   Drug use: No   Sexual activity: Not on file  Other Topics Concern   Not on file  Social History Narrative   Hairdresser    Social  Drivers of Health   Financial Resource Strain: Low Risk  (03/13/2021)   Overall Financial Resource Strain (CARDIA)    Difficulty of Paying Living Expenses: Not hard at all  Food Insecurity: No Food Insecurity (11/01/2020)   Hunger Vital Sign    Worried About Running Out of Food in the Last Year: Never true    Ran Out of Food in the Last Year: Never true  Transportation Needs: Not on file  Physical Activity: Insufficiently Active (11/01/2020)   Exercise Vital Sign    Days of Exercise per Week: 2 days    Minutes of Exercise per Session: 20 min  Stress: Not on file  Social Connections: Not on file     Review of Systems  Constitutional:  Negative for appetite change and unexpected weight change.  HENT:  Negative for congestion and sinus pressure.   Respiratory:  Negative for cough, chest tightness and shortness of breath.   Cardiovascular:  Negative for chest pain and palpitations.  Gastrointestinal:  Negative for abdominal pain, diarrhea, nausea and vomiting.  Genitourinary:  Negative for difficulty urinating and dysuria.  Musculoskeletal:  Negative for joint swelling and myalgias.       Upper back pain/left posterior shoulder pain.   Skin:  Negative for color change and rash.  Neurological:  Negative for dizziness and headaches.  Psychiatric/Behavioral:  Negative for agitation and dysphoric mood.        Objective:     BP 122/70   Pulse 90   Temp 98 F (36.7 C)   Resp 16   Ht 5\' 1"  (1.549 m)   Wt 139 lb 6.4 oz (63.2 kg)   SpO2 98%   BMI 26.34 kg/m  Wt Readings from Last 3 Encounters:  08/26/23 139 lb 6.4 oz (63.2 kg)  07/20/23 136 lb 3.2 oz (61.8 kg)  05/25/23 138 lb (62.6 kg)    Physical Exam Vitals reviewed.  Constitutional:      General: She is not in acute distress.    Appearance: Normal appearance.  HENT:     Head: Normocephalic and atraumatic.     Right Ear: External ear normal.     Left Ear: External ear normal.  Neck:     Thyroid: No thyromegaly.   Cardiovascular:     Rate and Rhythm: Normal rate and regular rhythm.  Pulmonary:     Effort: No respiratory distress.     Breath sounds: Normal breath sounds. No wheezing.  Abdominal:     General: Bowel sounds are normal.     Palpations: Abdomen is soft.     Tenderness: There is no abdominal tenderness.  Musculoskeletal:        General: No swelling.     Cervical back: Neck supple. No tenderness.     Comments:  Increased tenderness to palpation - left posterior shoulder - scapula  Lymphadenopathy:     Cervical: No cervical adenopathy.  Skin:    Findings: No erythema or rash.  Neurological:     Mental Status: She is alert.  Psychiatric:        Mood and Affect: Mood normal.        Behavior: Behavior normal.         Outpatient Encounter Medications as of 08/26/2023  Medication Sig   Azelastine-Fluticasone 137-50 MCG/ACT SUSP Place 1 spray into both nostrils as needed.   budesonide-formoterol (SYMBICORT) 80-4.5 MCG/ACT inhaler 1-2 puffs twice daily (rinse mouth after use)   busPIRone (BUSPAR) 5 MG tablet Take 1 tablet (5 mg total) by mouth daily.   calcium carbonate (OSCAL) 1500 (600 Ca) MG TABS tablet Take 1 tablet by mouth daily.   citalopram (CELEXA) 40 MG tablet TAKE 1 TABLET(40 MG) BY MOUTH DAILY   desloratadine (CLARINEX) 5 MG tablet Take 5 mg by mouth daily.   folic acid (FOLVITE) 1 MG tablet Take 1 mg by mouth daily.   losartan (COZAAR) 100 MG tablet TAKE 1 TABLET BY MOUTH DAILY   methotrexate (RHEUMATREX) 2.5 MG tablet Take 22.5 mg by mouth every Monday. Caution:Chemotherapy. Protect from light. Take 9 tabs po weekly with meals   pantoprazole (PROTONIX) 40 MG tablet Take 1 tablet (40 mg total) by mouth daily.   RINVOQ 15 MG TB24 Take 1 tablet by mouth daily.   Semaglutide-Weight Management (WEGOVY) 1 MG/0.5ML SOAJ Inject 1 mg into the skin once a week.   valACYclovir (VALTREX) 500 MG tablet TAKE 1 TABLET BY MOUTH TWICE DAILY FOR 3 DAYS. CONTINUE 1 BY MOUTH EVERY DAY    vitamin B-12 (CYANOCOBALAMIN) 100 MCG tablet Take 100 mcg by mouth daily.   Facility-Administered Encounter Medications as of 08/26/2023  Medication   betamethasone acetate-betamethasone sodium phosphate (CELESTONE) injection 12 mg     Lab Results  Component Value Date   WBC 5.6 07/20/2023   HGB 11.8 (L) 07/20/2023   HCT 34.9 (L) 07/20/2023   PLT 343.0 07/20/2023   GLUCOSE 80 07/20/2023   CHOL 234 (H) 07/20/2023   TRIG 74.0 07/20/2023   HDL 70.60 07/20/2023   LDLCALC 148 (H) 07/20/2023   ALT 14 07/20/2023   AST 28 07/20/2023   NA 135 07/20/2023   K 4.4 07/20/2023   CL 101 07/20/2023   CREATININE 0.87 07/20/2023   BUN 13 07/20/2023   CO2 28 07/20/2023   TSH 3.42 12/15/2022   HGBA1C 5.6 07/20/2023    [place] FNA BX THYROID 1ST LESION AFIRMA Result Date: 07/21/2023 INDICATION: Indeterminate thyroid nodule EXAM: ULTRASOUND GUIDED FINE NEEDLE ASPIRATION OF INDETERMINATE THYROID NODULE COMPARISON:  None Available. MEDICATIONS: None COMPLICATIONS: Prior thyroid ultrasound 05/04/2023 TECHNIQUE: Informed written consent was obtained from the patient after a discussion of the risks, benefits and alternatives to treatment. Questions regarding the procedure were encouraged and answered. A timeout was performed prior to the initiation of the procedure. Pre-procedural ultrasound scanning demonstrated unchanged size and appearance of the indeterminate nodule within the left mid gland The procedure was planned. The neck was prepped in the usual sterile fashion, and a sterile drape was applied covering the operative field. A timeout was performed prior to the initiation of the procedure. Local anesthesia was provided with 1% lidocaine. Under direct ultrasound guidance, 5 FNA biopsies were performed of the nodule with a 25 gauge needle. Two samples were reserved for future Afirma testing. Multiple ultrasound images were saved  for procedural documentation purposes. The samples were prepared and submitted to  pathology. Limited post procedural scanning was negative for hematoma or additional complication. Dressings were placed. The patient tolerated the above procedures procedure well without immediate postprocedural complication. FINDINGS: Nodule reference number based on prior diagnostic ultrasound: 3 Maximum size: 2.3 cm Location: Left; mid ACR TI-RADS risk category: TR3 (3 points) Reason for biopsy: patient/referrer request Ultrasound imaging confirms appropriate placement of the needles within the thyroid nodule. IMPRESSION: Technically successful ultrasound guided fine needle aspiration of the 2.3 cm TI-RADS category 3 nodule in the left mid gland. Electronically Signed   By: Malachy Moan M.D.   On: 07/21/2023 16:23       Assessment & Plan:  Rheumatoid arthritis with positive rheumatoid factor, involving unspecified site [facility]) Assessment & Plan: Sees rheumatology for RA. Last evaluated 08/10/23 - on rinvoq and plaquenil and Methotrexate. Was having hand stiffness, etc. Advised to increase plaquenil dose.    Essential hypertension, benign Assessment & Plan: Continue amlodipine and losartan.    Left-sided back pain, unspecified back location, unspecified chronicity Assessment & Plan: Left upper back pain/pain - scapula/adjacent - reproducible on exam. Lungs clear. Has flexeril. Continue - one q hs. Hold prednisone. Start with MR. Call with update. Xray at [facility] - per report - ok. Obtain results.       Dale [place], MD

## 2023-08-26 NOTE — Assessment & Plan Note (Signed)
 Sees rheumatology for RA. Last evaluated 08/10/23 - on rinvoq and plaquenil and Methotrexate. Was having hand stiffness, etc. Advised to increase plaquenil dose.

## 2023-08-31 ENCOUNTER — Encounter: Payer: Self-pay | Admitting: Internal Medicine

## 2023-08-31 DIAGNOSIS — M549 Dorsalgia, unspecified: Secondary | ICD-10-CM

## 2023-08-31 DIAGNOSIS — M25512 Pain in left shoulder: Secondary | ICD-10-CM

## 2023-09-04 ENCOUNTER — Other Ambulatory Visit: Payer: Self-pay | Admitting: Internal Medicine

## 2023-09-07 ENCOUNTER — Other Ambulatory Visit

## 2023-09-11 NOTE — Telephone Encounter (Signed)
 Order placed for referral to [facility].

## 2023-09-14 ENCOUNTER — Encounter: Payer: Self-pay | Admitting: Internal Medicine

## 2023-09-14 ENCOUNTER — Ambulatory Visit: Payer: 59 | Admitting: Internal Medicine

## 2023-09-14 VITALS — BP 118/70 | HR 99 | Temp 98.9°F | Ht 61.0 in | Wt 141.4 lb

## 2023-09-14 DIAGNOSIS — R739 Hyperglycemia, unspecified: Secondary | ICD-10-CM

## 2023-09-14 DIAGNOSIS — M059 Rheumatoid arthritis with rheumatoid factor, unspecified: Secondary | ICD-10-CM | POA: Diagnosis not present

## 2023-09-14 DIAGNOSIS — F419 Anxiety disorder, unspecified: Secondary | ICD-10-CM

## 2023-09-14 DIAGNOSIS — E7800 Pure hypercholesterolemia, unspecified: Secondary | ICD-10-CM | POA: Diagnosis not present

## 2023-09-14 DIAGNOSIS — K219 Gastro-esophageal reflux disease without esophagitis: Secondary | ICD-10-CM

## 2023-09-14 DIAGNOSIS — D329 Benign neoplasm of meninges, unspecified: Secondary | ICD-10-CM | POA: Diagnosis not present

## 2023-09-14 DIAGNOSIS — K5900 Constipation, unspecified: Secondary | ICD-10-CM

## 2023-09-14 DIAGNOSIS — E039 Hypothyroidism, unspecified: Secondary | ICD-10-CM

## 2023-09-14 DIAGNOSIS — I10 Essential (primary) hypertension: Secondary | ICD-10-CM

## 2023-09-14 MED ORDER — CITALOPRAM HYDROBROMIDE 40 MG PO TABS: ORAL_TABLET | ORAL | 5 refills | Status: DC

## 2023-09-14 MED ORDER — BUDESONIDE-FORMOTEROL FUMARATE 80-4.5 MCG/ACT IN AERO: INHALATION_SPRAY | RESPIRATORY_TRACT | 5 refills | Status: DC

## 2023-09-14 MED ORDER — BUSPIRONE HCL 7.5 MG PO TABS: 7.5000 mg | ORAL_TABLET | Freq: Two times a day (BID) | ORAL | 2 refills | Status: AC

## 2023-09-14 NOTE — Progress Notes (Signed)
 Subjective:    Patient ID: Tina Parker, female    DOB: [birth year], 53 y.o.   MRN: [id number]  Patient here for  Chief Complaint  Patient presents with   Medical Management of Chronic Issues    Anxiety     HPI Here for a scheduled follow up. Evaluated 08/26/23 - pain - upper back pain and left posterior shoulder pain. Evaluated at [facility]. Prescribed flexeril and prednisone. She did take MR. S/p injection - hand - Dr Amanda Pea. Shoulder/back pain improved initially. Did not take prednisone. Shoulder/back - flaring again. Saw GI 09/04/23 - f/u chronic constipation and dysphagia. Planning for EGD and colonoscopy. She is taking citalopram and buspar. Still with increased anxiety. Discussed increasing dose of buspar. Breathing overall stable.    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   Pure hypercholesterolemia    Rheumatoid arthritis(714.0)    positive anti CCP antibodies, positive RF, oligo-articular, MTX   Sciatica    Sleep apnea    Past Surgical History:  Procedure Laterality Date   ARTHRODESIS METATARSALPHALANGEAL JOINT (MTPJ) Right 06/27/2022   Procedure: ARTHRODESIS METATARSALPHALANGEAL JOINT (MTPJ);  Surgeon: Edwin Cap, DPM;  Location: [facility] ORS;  Service: Podiatry;  Laterality: Right;   CARPAL TUNNEL RELEASE Bilateral    CESAREAN SECTION  1996   COLONOSCOPY  03/14/2002, 08/16/2007, 03/07/2013   [facility]-father   COLONOSCOPY WITH PROPOFOL N/A 06/21/2018   Procedure: COLONOSCOPY WITH PROPOFOL;  Surgeon: Scot Jun, MD;  Location: [facility] ENDOSCOPY;  Service: Endoscopy;  Laterality: N/A;   CRANIOTOMY Left  06/25/2017   Procedure: CRANIOTOMY TEMPORAL LEFT FOR TUMOR RESECTION;  Surgeon: Coletta Memos, MD;  Location: [facility] OR;  Service: Neurosurgery;  Laterality: Left;   ESOPHAGOGASTRODUODENOSCOPY  03/14/2002, 03/07/2013   ESOPHAGOGASTRODUODENOSCOPY (EGD) WITH PROPOFOL N/A 06/21/2018   Procedure: ESOPHAGOGASTRODUODENOSCOPY (EGD) WITH PROPOFOL;  Surgeon: Scot Jun, MD;  Location: [facility] ENDOSCOPY;  Service: Endoscopy;  Laterality: N/A;   ESOPHAGOGASTRODUODENOSCOPY (EGD) WITH PROPOFOL N/A 07/15/2019   Procedure: ESOPHAGOGASTRODUODENOSCOPY (EGD) WITH PROPOFOL;  Surgeon: Wyline Mood, MD;  Location: [facility] ENDOSCOPY;  Service: Gastroenterology;  Laterality: N/A;   FOOT ARTHRODESIS Right 06/27/2022   Procedure: ARTHRODESIS FOOT;  Surgeon: Edwin Cap, DPM;  Location: [facility] ORS;  Service: Podiatry;  Laterality: Right;   Post Septoplasty and turbinate reduction  2007   TRIGGER FINGER RELEASE     Family History  Problem Relation Age of Onset   Cancer Mother        Breast Cancer   Breast cancer Mother 64   Cancer Father        Colon Cancer   Heart disease Father        Hx of MI   Hypertension Father    Diabetes Father    Hyperlipidemia Father    Cancer Maternal Grandmother        lung cancer   Cancer Maternal Uncle        esophageal   Social History   Socioeconomic History   Marital status: Married  Spouse name: ERIC   Number of children: 2   Years of education: Not on file   Highest education level: Not on file  Occupational History   Occupation: Hairstylist  Tobacco Use   Smoking status: Never   Smokeless tobacco: Never  Vaping Use   Vaping status: Never Used  Substance and Sexual Activity   Alcohol use: Not Currently    Comment: wine cooler 2 drinks 2x a week/ OCCASIONALLY   Drug use: No   Sexual activity: Not on file  Other Topics Concern   Not on file  Social History Narrative   Hairdresser    Social Drivers of Health   Financial Resource Strain: Low Risk  (09/04/2023)    Received from [facility]   Overall Financial Resource Strain (CARDIA)    Difficulty of Paying Living Expenses: Not hard at all  Food Insecurity: No Food Insecurity (09/04/2023)   Received from [facility]   Hunger Vital Sign    Worried About Running Out of Food in the Last Year: Never true    Ran Out of Food in the Last Year: Never true  Transportation Needs: No Transportation Needs (09/04/2023)   Received from [facility] - Transportation    In the past 12 months, has lack of transportation kept you from medical appointments or from getting medications?: No    Lack of Transportation (Non-Medical): No  Physical Activity: Insufficiently Active (11/01/2020)   Exercise Vital Sign    Days of Exercise per Week: 2 days    Minutes of Exercise per Session: 20 min  Stress: Not on file  Social Connections: Not on file     Review of Systems  Constitutional:  Negative for appetite change and unexpected weight change.  HENT:  Negative for congestion and sinus pressure.   Respiratory:  Negative for cough, chest tightness and shortness of breath.   Cardiovascular:  Negative for chest pain, palpitations and leg swelling.  Gastrointestinal:  Negative for abdominal pain, diarrhea, nausea and vomiting.  Genitourinary:  Negative for difficulty urinating and dysuria.  Musculoskeletal:  Negative for joint swelling and myalgias.  Skin:  Negative for color change and rash.  Neurological:  Negative for dizziness and headaches.  Psychiatric/Behavioral:  Negative for agitation and dysphoric mood.        Increased stress/anxiety.        Objective:     BP 118/70   Pulse 99   Temp 98.9 F (37.2 C) (Oral)   Ht 5\' 1"  (1.549 m)   Wt 141 lb 6.4 oz (64.1 kg)   SpO2 97%   BMI 26.72 kg/m  Wt Readings from Last 3 Encounters:  09/14/23 141 lb 6.4 oz (64.1 kg)  08/26/23 139 lb 6.4 oz (63.2 kg)  07/20/23 136 lb 3.2 oz (61.8 kg)    Physical  Exam Vitals reviewed.  Constitutional:      General: She is not in acute distress.    Appearance: Normal appearance.  HENT:     Head: Normocephalic and atraumatic.     Right Ear: External ear normal.     Left Ear: External ear normal.     Mouth/Throat:     Pharynx: No oropharyngeal exudate or posterior oropharyngeal erythema.  Eyes:     General: No scleral icterus.       Right eye: No discharge.        Left eye: No discharge.     Conjunctiva/sclera: Conjunctivae normal.  Neck:  Thyroid: No thyromegaly.  Cardiovascular:     Rate and Rhythm: Normal rate and regular rhythm.  Pulmonary:     Effort: No respiratory distress.     Breath sounds: Normal breath sounds. No wheezing.  Abdominal:     General: Bowel sounds are normal.     Palpations: Abdomen is soft.     Tenderness: There is no abdominal tenderness.  Musculoskeletal:        General: No swelling or tenderness.     Cervical back: Neck supple. No tenderness.  Lymphadenopathy:     Cervical: No cervical adenopathy.  Skin:    Findings: No erythema or rash.  Neurological:     Mental Status: She is alert.  Psychiatric:        Mood and Affect: Mood normal.        Behavior: Behavior normal.         Outpatient Encounter Medications as of 09/14/2023  Medication Sig   Azelastine-Fluticasone 137-50 MCG/ACT SUSP Place 1 spray into both nostrils as needed.   busPIRone (BUSPAR) 7.5 MG tablet Take 1 tablet (7.5 mg total) by mouth 2 (two) times daily.   calcium carbonate (OSCAL) 1500 (600 Ca) MG TABS tablet Take 1 tablet by mouth daily.   desloratadine (CLARINEX) 5 MG tablet Take 5 mg by mouth daily.   folic acid (FOLVITE) 1 MG tablet Take 1 mg by mouth daily.   losartan (COZAAR) 100 MG tablet TAKE 1 TABLET BY MOUTH DAILY   methotrexate (RHEUMATREX) 2.5 MG tablet Take 22.5 mg by mouth every Monday. Caution:Chemotherapy. Protect from light. Take 9 tabs po weekly with meals   pantoprazole (PROTONIX) 40 MG tablet Take 1 tablet  (40 mg total) by mouth daily.   RINVOQ 15 MG TB24 Take 1 tablet by mouth daily.   Semaglutide-Weight Management (WEGOVY) 1 MG/0.5ML SOAJ Inject 1 mg into the skin once a week.   valACYclovir (VALTREX) 500 MG tablet TAKE 1 TABLET BY MOUTH TWICE DAILY FOR 3 DAYS. CONTINUE 1 BY MOUTH EVERY DAY   [DISCONTINUED] budesonide-formoterol (SYMBICORT) 80-4.5 MCG/ACT inhaler 1-2 puffs twice daily (rinse mouth after use)   [DISCONTINUED] busPIRone (BUSPAR) 5 MG tablet Take 1 tablet (5 mg total) by mouth daily.   [DISCONTINUED] citalopram (CELEXA) 40 MG tablet TAKE 1 TABLET(40 MG) BY MOUTH DAILY   budesonide-formoterol (SYMBICORT) 80-4.5 MCG/ACT inhaler 1-2 puffs twice daily (rinse mouth after use)   citalopram (CELEXA) 40 MG tablet TAKE 1 TABLET(40 MG) BY MOUTH DAILY   [DISCONTINUED] vitamin B-12 (CYANOCOBALAMIN) 100 MCG tablet Take 100 mcg by mouth daily. (Patient not taking: Reported on 09/14/2023)   Facility-Administered Encounter Medications as of 09/14/2023  Medication   betamethasone acetate-betamethasone sodium phosphate (CELESTONE) injection 12 mg     Lab Results  Component Value Date   WBC 5.6 07/20/2023   HGB 11.8 (L) 07/20/2023   HCT 34.9 (L) 07/20/2023   PLT 343.0 07/20/2023   GLUCOSE 80 07/20/2023   CHOL 234 (H) 07/20/2023   TRIG 74.0 07/20/2023   HDL 70.60 07/20/2023   LDLCALC 148 (H) 07/20/2023   ALT 14 07/20/2023   AST 28 07/20/2023   NA 135 07/20/2023   K 4.4 07/20/2023   CL 101 07/20/2023   CREATININE 0.87 07/20/2023   BUN 13 07/20/2023   CO2 28 07/20/2023   TSH 3.42 12/15/2022   HGBA1C 5.6 07/20/2023    [place] FNA BX THYROID 1ST LESION AFIRMA Result Date: 07/21/2023 INDICATION: Indeterminate thyroid nodule EXAM: ULTRASOUND GUIDED FINE NEEDLE ASPIRATION OF INDETERMINATE THYROID  NODULE COMPARISON:  None Available. MEDICATIONS: None COMPLICATIONS: Prior thyroid ultrasound 05/04/2023 TECHNIQUE: Informed written consent was obtained from the patient after a discussion of the risks,  benefits and alternatives to treatment. Questions regarding the procedure were encouraged and answered. A timeout was performed prior to the initiation of the procedure. Pre-procedural ultrasound scanning demonstrated unchanged size and appearance of the indeterminate nodule within the left mid gland The procedure was planned. The neck was prepped in the usual sterile fashion, and a sterile drape was applied covering the operative field. A timeout was performed prior to the initiation of the procedure. Local anesthesia was provided with 1% lidocaine. Under direct ultrasound guidance, 5 FNA biopsies were performed of the nodule with a 25 gauge needle. Two samples were reserved for future Afirma testing. Multiple ultrasound images were saved for procedural documentation purposes. The samples were prepared and submitted to pathology. Limited post procedural scanning was negative for hematoma or additional complication. Dressings were placed. The patient tolerated the above procedures procedure well without immediate postprocedural complication. FINDINGS: Nodule reference number based on prior diagnostic ultrasound: 3 Maximum size: 2.3 cm Location: Left; mid ACR TI-RADS risk category: TR3 (3 points) Reason for biopsy: patient/referrer request Ultrasound imaging confirms appropriate placement of the needles within the thyroid nodule. IMPRESSION: Technically successful ultrasound guided fine needle aspiration of the 2.3 cm TI-RADS category 3 nodule in the left mid gland. Electronically Signed   By: Malachy Moan M.D.   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     Hypercholesterolemia Assessment & Plan: The 10-year ASCVD risk score (Arnett DK, et al., 2019) is: 3.1%   Values used to calculate the score:     Age: 15 years     Sex: Female     Is Non-Hispanic African American:  Yes     Diabetic: No     Tobacco smoker: No     Systolic Blood Pressure: 118 mmHg     Is BP treated: Yes     HDL Cholesterol: 70.6 mg/dL     Total Cholesterol: 234 mg/dL  Low cholesterol diet and exercise.  Follow lipid panel.   Orders: -     Lipid panel; Future -     Hepatic function panel; Future -     Basic metabolic panel; Future  Hyperglycemia Assessment & Plan: Low carb diet and exercise. Follow met b and A1c.   Orders: -     Hemoglobin A1c; Future  Rheumatoid arthritis with positive rheumatoid factor, involving unspecified site [facility]) Assessment & Plan: Sees rheumatology for RA. Last evaluated 08/10/23 - on rinvoq and plaquenil and Methotrexate. Continue f/u with rheumatology.   Orders: -     CBC with Differential/Platelet; Future  Hypothyroidism, unspecified type Assessment & Plan: On thyroid replacement.  Follow tsh.    Gastroesophageal reflux disease, unspecified whether esophagitis present Assessment & Plan: Saw GI 09/04/23. F/u chronic constipation and dysphagia. Continue protonix. Planning EGD and colonoscopy.    Essential hypertension, benign Assessment & Plan: Continue amlodipine and losartan. Blood pressure as outlined. No changes today.    Anxiety Assessment & Plan: Continue citalopram. Increased anxiety persistent. Increase buspar to 7.5mg . follow.    Constipation, unspecified constipation type Assessment & Plan: Saw GI. Miralax. Planning for colonoscopy.    Other orders -     Budesonide-Formoterol Fumarate; 1-2 puffs twice daily (rinse  mouth after use)  Dispense: 1 each; Refill: 5 -     Citalopram Hydrobromide; TAKE 1 TABLET(40 MG) BY MOUTH DAILY  Dispense: 30 tablet; Refill: 5 -     busPIRone HCl; Take 1 tablet (7.5 mg total) by mouth 2 (two) times daily.  Dispense: 60 tablet; Refill: 2     Dale [place], MD

## 2023-09-14 NOTE — Patient Instructions (Signed)
 Restart oral B12 - one per day

## 2023-09-20 ENCOUNTER — Encounter: Payer: Self-pay | Admitting: Internal Medicine

## 2023-09-20 NOTE — Assessment & Plan Note (Signed)
 Sees rheumatology for RA. Last evaluated 08/10/23 - on rinvoq and plaquenil and Methotrexate. Continue f/u with rheumatology.

## 2023-09-20 NOTE — Assessment & Plan Note (Signed)
 Continue amlodipine and losartan. Blood pressure as outlined. No changes today.

## 2023-09-20 NOTE — Assessment & Plan Note (Signed)
 On thyroid replacement.  Follow tsh.

## 2023-09-20 NOTE — Assessment & Plan Note (Signed)
 The 10-year ASCVD risk score (Arnett DK, et al., 2019) is: 3.1%   Values used to calculate the score:     Age: 53 years     Sex: Female     Is Non-Hispanic African American: Yes     Diabetic: No     Tobacco smoker: No     Systolic Blood Pressure: 118 mmHg     Is BP treated: Yes     HDL Cholesterol: 70.6 mg/dL     Total Cholesterol: 234 mg/dL  Low cholesterol diet and exercise.  Follow lipid panel.

## 2023-09-20 NOTE — Assessment & Plan Note (Signed)
 Saw GI. Miralax. Planning for colonoscopy.

## 2023-09-20 NOTE — Assessment & Plan Note (Signed)
 Has been followed by Dr Franky Macho - [facility].

## 2023-09-20 NOTE — Assessment & Plan Note (Signed)
 Saw GI 09/04/23. F/u chronic constipation and dysphagia. Continue protonix. Planning EGD and colonoscopy.

## 2023-09-20 NOTE — Assessment & Plan Note (Signed)
 Continue citalopram. Increased anxiety persistent. Increase buspar to 7.5mg . follow.

## 2023-09-20 NOTE — Assessment & Plan Note (Signed)
 Low-carb diet and exercise.  Follow met b and A1c.

## 2023-09-28 ENCOUNTER — Ambulatory Visit
Admission: RE | Admit: 2023-09-28 | Discharge: 2023-09-28 | Disposition: A | Discharge status: Home / Self Care | Payer: 59 | Source: Ambulatory Visit | Attending: Internal Medicine | Admitting: Internal Medicine

## 2023-09-28 DIAGNOSIS — Z1231 Encounter for screening mammogram for malignant neoplasm of breast: Secondary | ICD-10-CM | POA: Insufficient documentation

## 2023-10-13 ENCOUNTER — Encounter: Payer: Self-pay | Admitting: Internal Medicine

## 2023-10-13 DIAGNOSIS — M5412 Radiculopathy, cervical region: Secondary | ICD-10-CM | POA: Insufficient documentation

## 2023-10-21 ENCOUNTER — Other Ambulatory Visit: Payer: Self-pay | Admitting: Nephrology

## 2023-10-21 DIAGNOSIS — R935 Abnormal findings on diagnostic imaging of other abdominal regions, including retroperitoneum: Secondary | ICD-10-CM

## 2023-10-23 ENCOUNTER — Other Ambulatory Visit: Payer: Self-pay | Admitting: Orthopedic Surgery

## 2023-10-23 DIAGNOSIS — M5412 Radiculopathy, cervical region: Secondary | ICD-10-CM

## 2023-10-26 ENCOUNTER — Ambulatory Visit
Admission: RE | Admit: 2023-10-26 | Discharge: 2023-10-26 | Disposition: A | Discharge status: Home / Self Care | Source: Ambulatory Visit | Attending: Orthopedic Surgery

## 2023-10-26 ENCOUNTER — Other Ambulatory Visit: Payer: Self-pay | Admitting: Orthopedic Surgery

## 2023-10-26 DIAGNOSIS — M5412 Radiculopathy, cervical region: Secondary | ICD-10-CM

## 2023-10-26 MED ORDER — IOPAMIDOL (ISOVUE-300) INJECTION 61%
75.0000 mL | Freq: Once | INTRAVENOUS | Status: AC | PRN
  Administered 2023-10-26: 75 mL via INTRAVENOUS

## 2023-11-09 ENCOUNTER — Encounter: Payer: Self-pay | Admitting: Internal Medicine

## 2023-11-09 ENCOUNTER — Ambulatory Visit: Admitting: Internal Medicine

## 2023-11-09 VITALS — BP 126/72 | HR 89 | Temp 98.0°F | Resp 16 | Ht 61.0 in | Wt 139.8 lb

## 2023-11-09 DIAGNOSIS — D329 Benign neoplasm of meninges, unspecified: Secondary | ICD-10-CM

## 2023-11-09 DIAGNOSIS — M5412 Radiculopathy, cervical region: Secondary | ICD-10-CM

## 2023-11-09 DIAGNOSIS — K219 Gastro-esophageal reflux disease without esophagitis: Secondary | ICD-10-CM

## 2023-11-09 DIAGNOSIS — E7800 Pure hypercholesterolemia, unspecified: Secondary | ICD-10-CM

## 2023-11-09 DIAGNOSIS — E039 Hypothyroidism, unspecified: Secondary | ICD-10-CM

## 2023-11-09 DIAGNOSIS — R739 Hyperglycemia, unspecified: Secondary | ICD-10-CM | POA: Diagnosis not present

## 2023-11-09 DIAGNOSIS — F419 Anxiety disorder, unspecified: Secondary | ICD-10-CM

## 2023-11-09 DIAGNOSIS — I10 Essential (primary) hypertension: Secondary | ICD-10-CM

## 2023-11-09 DIAGNOSIS — M059 Rheumatoid arthritis with rheumatoid factor, unspecified: Secondary | ICD-10-CM

## 2023-11-09 LAB — CBC WITH DIFFERENTIAL/PLATELET
Basophils Absolute: 0 10*3/uL (ref 0.0–0.1)
Basophils Relative: 0.5 % (ref 0.0–3.0)
Eosinophils Absolute: 0.1 10*3/uL (ref 0.0–0.7)
Eosinophils Relative: 1.3 % (ref 0.0–5.0)
HCT: 34.1 % — ABNORMAL LOW (ref 36.0–46.0)
Hemoglobin: 11.4 g/dL — ABNORMAL LOW (ref 12.0–15.0)
Lymphocytes Relative: 15.6 % (ref 12.0–46.0)
Lymphs Abs: 0.8 10*3/uL (ref 0.7–4.0)
MCHC: 33.3 g/dL (ref 30.0–36.0)
MCV: 95 fl (ref 78.0–100.0)
Monocytes Absolute: 0.6 10*3/uL (ref 0.1–1.0)
Monocytes Relative: 12.6 % — ABNORMAL HIGH (ref 3.0–12.0)
Neutro Abs: 3.5 10*3/uL (ref 1.4–7.7)
Neutrophils Relative %: 70 % (ref 43.0–77.0)
Platelets: 353 10*3/uL (ref 150.0–400.0)
RBC: 3.59 Mil/uL — ABNORMAL LOW (ref 3.87–5.11)
RDW: 14.4 % (ref 11.5–15.5)
WBC: 5 10*3/uL (ref 4.0–10.5)

## 2023-11-09 LAB — HEMOGLOBIN A1C: Hgb A1c MFr Bld: 5.4 % (ref 4.6–6.5)

## 2023-11-09 LAB — BASIC METABOLIC PANEL WITH GFR
BUN: 12 mg/dL (ref 6–23)
CO2: 29 meq/L (ref 19–32)
Calcium: 9.4 mg/dL (ref 8.4–10.5)
Chloride: 102 meq/L (ref 96–112)
Creatinine, Ser: 0.88 mg/dL (ref 0.40–1.20)
GFR: 74.4 mL/min (ref >60.00)
Glucose, Bld: 85 mg/dL (ref 70–99)
Potassium: 4.5 meq/L (ref 3.5–5.1)
Sodium: 136 meq/L (ref 135–145)

## 2023-11-09 LAB — HEPATIC FUNCTION PANEL
ALT: 19 U/L (ref 0–35)
AST: 24 U/L (ref 0–37)
Albumin: 3.8 g/dL (ref 3.5–5.2)
Alkaline Phosphatase: 89 U/L (ref 39–117)
Bilirubin, Direct: 0.1 mg/dL (ref 0.0–0.3)
Total Bilirubin: 0.5 mg/dL (ref 0.2–1.2)
Total Protein: 7.3 g/dL (ref 6.0–8.3)

## 2023-11-09 LAB — LIPID PANEL
Cholesterol: 240 mg/dL — ABNORMAL HIGH (ref 0–200)
HDL: 64.3 mg/dL (ref >39.00)
LDL Cholesterol: 158 mg/dL — ABNORMAL HIGH (ref 0–99)
NonHDL: 176.18
Total CHOL/HDL Ratio: 4
Triglycerides: 93 mg/dL (ref 0.0–149.0)
VLDL: 18.6 mg/dL (ref 0.0–40.0)

## 2023-11-09 LAB — TSH: TSH: 1.65 u[IU]/mL (ref 0.35–5.50)

## 2023-11-09 NOTE — Progress Notes (Signed)
 Subjective:    Patient ID: Tina Parker, female    DOB: [birth year], 53 y.o.   MRN: [id number]  Patient here for  Chief Complaint  Patient presents with   Medical Management of Chronic Issues    HPI Here for a scheduled follow up - follow up regarding hypercholesterolemia, hyperglycemia, RA, anxiety and hypertension. Currently on citalopram  and buspar . Buspar  increased to 7.5mg  last visit. Had f/u with ortho 10/12/23 - MRI C-spine C4-5: Severe right lateral foraminal stenosis, C5-6: Severe right-sided foraminal stenosis, C6-7: Severe right-sided greater than  left-sided foraminal stenosis. S/p cervical interlaminar epidural steroid injection 11/04/23.  Does feel has helped some. Plans to start back going to the gym. She is followed by endocrinology for her thyroid . Recent biopsy 07/2023 - benign. Plans to continue yearly follow up. Handling stress. Does feel the buspar  is helping. Does forget to take in the pm. Plans to start taking more regularly. Does report some fatigue. Not sure if related to not exercising regularly. Does plan to restart as outlined. Discussed will check labs also to confirm cbc and thyroid  ok.    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   Pure hypercholesterolemia    Rheumatoid arthritis(714.0)    positive anti CCP antibodies, positive RF, oligo-articular, MTX   Sciatica    Sleep apnea    Past Surgical History:  Procedure Laterality Date   ARTHRODESIS METATARSALPHALANGEAL JOINT (MTPJ) Right 06/27/2022   Procedure: ARTHRODESIS METATARSALPHALANGEAL JOINT (MTPJ);  Surgeon: Floyce Hutching, DPM;   Location: [facility] ORS;  Service: Podiatry;  Laterality: Right;   CARPAL TUNNEL RELEASE Bilateral    CESAREAN SECTION  1996   COLONOSCOPY  03/14/2002, 08/16/2007, 03/07/2013   [facility]-father   COLONOSCOPY WITH PROPOFOL  N/A 06/21/2018   Procedure: COLONOSCOPY WITH PROPOFOL ;  Surgeon: Cassie Click, MD;  Location: [facility] ENDOSCOPY;  Service: Endoscopy;  Laterality: N/A;   CRANIOTOMY Left 06/25/2017   Procedure: CRANIOTOMY TEMPORAL LEFT FOR TUMOR RESECTION;  Surgeon: Audie Bleacher, MD;  Location: [facility] OR;  Service: Neurosurgery;  Laterality: Left;   ESOPHAGOGASTRODUODENOSCOPY  03/14/2002, 03/07/2013   ESOPHAGOGASTRODUODENOSCOPY (EGD) WITH PROPOFOL  N/A 06/21/2018   Procedure: ESOPHAGOGASTRODUODENOSCOPY (EGD) WITH PROPOFOL ;  Surgeon: Cassie Click, MD;  Location: [facility] ENDOSCOPY;  Service: Endoscopy;  Laterality: N/A;   ESOPHAGOGASTRODUODENOSCOPY (EGD) WITH PROPOFOL  N/A 07/15/2019   Procedure: ESOPHAGOGASTRODUODENOSCOPY (EGD) WITH PROPOFOL ;  Surgeon: Luke Salaam, MD;  Location: [facility] ENDOSCOPY;  Service: Gastroenterology;  Laterality: N/A;   FOOT ARTHRODESIS Right 06/27/2022   Procedure: ARTHRODESIS FOOT;  Surgeon: Floyce Hutching, DPM;  Location: [facility] ORS;  Service: Podiatry;  Laterality: Right;   Post Septoplasty and turbinate reduction  2007   TRIGGER FINGER RELEASE     Family History  Problem Relation Age of Onset   Cancer Mother        Breast Cancer   Breast cancer Mother 35   Cancer Father        Colon Cancer   Heart disease Father        Hx of  MI   Hypertension Father    Diabetes Father    Hyperlipidemia Father    Cancer Maternal Grandmother        lung cancer   Cancer Maternal Uncle        esophageal   Social History   Socioeconomic History   Marital status: Married    Spouse name: ERIC   Number of children: 2   Years of education: Not on file   Highest education level: Not on file  Occupational History   Occupation: Hairstylist  Tobacco Use   Smoking status: Never   Smokeless  tobacco: Never  Vaping Use   Vaping status: Never Used  Substance and Sexual Activity   Alcohol use: Not Currently    Comment: wine cooler 2 drinks 2x a week/ OCCASIONALLY   Drug use: No   Sexual activity: Not on file  Other Topics Concern   Not on file  Social History Narrative   Hairdresser    Social Drivers of Health   Financial Resource Strain: Low Risk  (09/04/2023)   Received from [facility]   Overall Financial Resource Strain (CARDIA)    Difficulty of Paying Living Expenses: Not hard at all  Food Insecurity: No Food Insecurity (09/04/2023)   Received from [facility]   Hunger Vital Sign    Worried About Running Out of Food in the Last Year: Never true    Ran Out of Food in the Last Year: Never true  Transportation Needs: No Transportation Needs (09/04/2023)   Received from [facility] - Transportation    In the past 12 months, has lack of transportation kept you from medical appointments or from getting medications?: No    Lack of Transportation (Non-Medical): No  Physical Activity: Insufficiently Active (11/01/2020)   Exercise Vital Sign    Days of Exercise per Week: 2 days    Minutes of Exercise per Session: 20 min  Stress: Not on file  Social Connections: Not on file     Review of Systems  Constitutional:  Positive for fatigue. Negative for appetite change and unexpected weight change.  HENT:  Negative for sinus pressure.        Allergy symptoms appear to be controlled.   Respiratory:  Negative for cough, chest tightness and shortness of breath.   Cardiovascular:  Negative for chest pain, palpitations and leg swelling.  Gastrointestinal:  Negative for abdominal pain, diarrhea, nausea and vomiting.  Genitourinary:  Negative for difficulty urinating and dysuria.  Musculoskeletal:  Negative for joint swelling and myalgias.  Skin:  Negative for color change and rash.  Neurological:  Negative for dizziness  and headaches.  Psychiatric/Behavioral:  Negative for agitation and dysphoric mood.        Objective:     BP 126/72   Pulse 89   Temp 98 F (36.7 C)   Resp 16   Ht 5\' 1"  (1.549 m)   Wt 139 lb 12.8 oz (63.4 kg)   LMP  (LMP Unknown)   SpO2 99%   BMI 26.41 kg/m  Wt Readings from Last 3 Encounters:  11/09/23 139 lb 12.8 oz (63.4 kg)  09/14/23 141 lb 6.4 oz (64.1 kg)  08/26/23 139 lb 6.4 oz (63.2 kg)    Physical Exam Vitals reviewed.  Constitutional:      General: She is not in acute distress.    Appearance: Normal appearance.  HENT:     Head: Normocephalic and atraumatic.  Right Ear: External ear normal.     Left Ear: External ear normal.     Mouth/Throat:     Pharynx: No oropharyngeal exudate or posterior oropharyngeal erythema.  Eyes:     General: No scleral icterus.       Right eye: No discharge.        Left eye: No discharge.     Conjunctiva/sclera: Conjunctivae normal.  Neck:     Thyroid : No thyromegaly.  Cardiovascular:     Rate and Rhythm: Normal rate and regular rhythm.  Pulmonary:     Effort: No respiratory distress.     Breath sounds: Normal breath sounds. No wheezing.  Abdominal:     General: Bowel sounds are normal.     Palpations: Abdomen is soft.     Tenderness: There is no abdominal tenderness.  Musculoskeletal:        General: No swelling or tenderness.     Cervical back: Neck supple. No tenderness.  Lymphadenopathy:     Cervical: No cervical adenopathy.  Skin:    Findings: No erythema or rash.  Neurological:     Mental Status: She is alert.  Psychiatric:        Mood and Affect: Mood normal.        Behavior: Behavior normal.         Outpatient Encounter Medications as of 11/09/2023  Medication Sig   Azelastine -Fluticasone 137-50 MCG/ACT SUSP Place 1 spray into both nostrils as needed.   budesonide -formoterol  (SYMBICORT ) 80-4.5 MCG/ACT inhaler 1-2 puffs twice daily (rinse mouth after use)   busPIRone  (BUSPAR ) 7.5 MG tablet Take 1  tablet (7.5 mg total) by mouth 2 (two) times daily.   calcium carbonate (OSCAL) 1500 (600 Ca) MG TABS tablet Take 1 tablet by mouth daily.   citalopram  (CELEXA ) 40 MG tablet TAKE 1 TABLET(40 MG) BY MOUTH DAILY   desloratadine (CLARINEX) 5 MG tablet Take 5 mg by mouth daily.   folic acid (FOLVITE) 1 MG tablet Take 1 mg by mouth daily.   losartan  (COZAAR ) 100 MG tablet TAKE 1 TABLET BY MOUTH DAILY   methotrexate  (RHEUMATREX) 2.5 MG tablet Take 22.5 mg by mouth every Monday. Caution:Chemotherapy. Protect from light. Take 9 tabs po weekly with meals   pantoprazole  (PROTONIX ) 40 MG tablet Take 1 tablet (40 mg total) by mouth daily.   RINVOQ 15 MG TB24 Take 1 tablet by mouth daily.   Semaglutide -Weight Management (WEGOVY ) 1 MG/0.5ML SOAJ Inject 1 mg into the skin once a week.   valACYclovir  (VALTREX ) 500 MG tablet TAKE 1 TABLET BY MOUTH TWICE DAILY FOR 3 DAYS. CONTINUE 1 BY MOUTH EVERY DAY   Facility-Administered Encounter Medications as of 11/09/2023  Medication   betamethasone  acetate-betamethasone  sodium phosphate  (CELESTONE ) injection 12 mg     Lab Results  Component Value Date   WBC 5.6 07/20/2023   HGB 11.8 (L) 07/20/2023   HCT 34.9 (L) 07/20/2023   PLT 343.0 07/20/2023   GLUCOSE 80 07/20/2023   CHOL 234 (H) 07/20/2023   TRIG 74.0 07/20/2023   HDL 70.60 07/20/2023   LDLCALC 148 (H) 07/20/2023   ALT 14 07/20/2023   AST 28 07/20/2023   NA 135 07/20/2023   K 4.4 07/20/2023   CL 101 07/20/2023   CREATININE 0.87 07/20/2023   BUN 13 07/20/2023   CO2 28 07/20/2023   TSH 3.42 12/15/2022   HGBA1C 5.6 07/20/2023       Assessment & Plan:  Hypercholesterolemia Assessment & Plan: The 10-year ASCVD risk score (Arnett  DK, et al., 2019) is: 7.9%   Values used to calculate the score:     Age: 60 years     Sex: Female     Is Non-Hispanic African American: Yes     Diabetic: No     Tobacco smoker: No     Systolic Blood Pressure: 154 mmHg     Is BP treated: Yes     HDL Cholesterol:  70.6 mg/dL     Total Cholesterol: 234 mg/dL  Low cholesterol diet and exercise.  Follow lipid panel.  Check lipid panel today.   Orders: -     Basic metabolic panel with GFR -     Hepatic function panel -     Lipid panel  Hypothyroidism, unspecified type Assessment & Plan: On thyroid  replacement.  Follow tsh. Recheck today. Seeing endocrinology. S/p biopsy 07/2023 - benign.   Orders: -     TSH  Rheumatoid arthritis with positive rheumatoid factor, involving unspecified site [facility]) Assessment & Plan: Sees rheumatology for RA. Last evaluated 08/10/23 - on rinvoq and plaquenil and Methotrexate . Continue f/u with rheumatology - stable.   Orders: -     CBC with Differential/Platelet  Hyperglycemia Assessment & Plan: Low carb diet and exercise. Follow met b and A1c.   Orders: -     Hemoglobin A1c  Anxiety Assessment & Plan: Continues on citalopram . Now on buspar  7.5mg . Does feel helps. Does not take regularly in the evening. Plans to start taking more regularly.    Cervical radiculopathy Assessment & Plan: Had f/u with ortho 10/12/23 - MRI C-spine C4-5: Severe right lateral foraminal stenosis, C5-6: Severe right-sided foraminal stenosis, C6-7: Severe right-sided greater than  left-sided foraminal stenosis. S/p cervical interlaminar epidural steroid injection 11/04/23.  Feels has helped. Continue f/u with ortho    Essential hypertension, benign Assessment & Plan: Continue amlodipine  and losartan . Blood pressure as outlined.  No changes in medication. Follow.    Gastroesophageal reflux disease, unspecified whether esophagitis present Assessment & Plan: Saw GI 09/04/23. F/u chronic constipation and dysphagia. Continue protonix . No upper symptoms reported. Planning colonoscopy next week.     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        Dellar Fenton, MD

## 2023-11-09 NOTE — Assessment & Plan Note (Signed)
 Had f/u with ortho 10/12/23 - MRI C-spine C4-5: Severe right lateral foraminal stenosis, C5-6: Severe right-sided foraminal stenosis, C6-7: Severe right-sided greater than  left-sided foraminal stenosis. S/p cervical interlaminar epidural steroid injection 11/04/23.  Feels has helped. Continue f/u with ortho

## 2023-11-09 NOTE — Assessment & Plan Note (Signed)
 Continue amlodipine  and losartan . Blood pressure as outlined.  No changes in medication. Follow.

## 2023-11-09 NOTE — Assessment & Plan Note (Signed)
 Saw GI 09/04/23. F/u chronic constipation and dysphagia. Continue protonix . No upper symptoms reported. Planning colonoscopy next week.

## 2023-11-09 NOTE — Assessment & Plan Note (Signed)
 Continues on citalopram . Now on buspar  7.5mg . Does feel helps. Does not take regularly in the evening. Plans to start taking more regularly.

## 2023-11-09 NOTE — Assessment & Plan Note (Signed)
 On thyroid  replacement.  Follow tsh. Recheck today. Seeing endocrinology. S/p biopsy 07/2023 - benign.

## 2023-11-09 NOTE — Assessment & Plan Note (Signed)
 Low-carb diet and exercise.  Follow met b and A1c.

## 2023-11-09 NOTE — Assessment & Plan Note (Addendum)
 The 10-year ASCVD risk score (Arnett DK, et al., 2019) is: 7.9%   Values used to calculate the score:     Age: 53 years     Sex: Female     Is Non-Hispanic African American: Yes     Diabetic: No     Tobacco smoker: No     Systolic Blood Pressure: 154 mmHg     Is BP treated: Yes     HDL Cholesterol: 70.6 mg/dL     Total Cholesterol: 234 mg/dL  Low cholesterol diet and exercise.  Follow lipid panel.  Check lipid panel today.

## 2023-11-09 NOTE — Assessment & Plan Note (Signed)
 Has been followed by Dr Franky Macho - [facility].

## 2023-11-09 NOTE — Assessment & Plan Note (Signed)
 Sees rheumatology for RA. Last evaluated 08/10/23 - on rinvoq and plaquenil and Methotrexate . Continue f/u with rheumatology - stable.

## 2023-11-11 ENCOUNTER — Other Ambulatory Visit: Payer: Self-pay | Admitting: Internal Medicine

## 2023-11-11 ENCOUNTER — Ambulatory Visit: Payer: Self-pay | Admitting: Internal Medicine

## 2023-11-11 ENCOUNTER — Other Ambulatory Visit: Payer: Self-pay

## 2023-11-11 DIAGNOSIS — D649 Anemia, unspecified: Secondary | ICD-10-CM

## 2023-11-11 DIAGNOSIS — I10 Essential (primary) hypertension: Secondary | ICD-10-CM

## 2023-11-11 NOTE — Progress Notes (Signed)
 Order placed for future labs - to be drawn with cbc and B12

## 2023-11-16 ENCOUNTER — Ambulatory Visit: Payer: Self-pay

## 2023-11-16 ENCOUNTER — Ambulatory Visit: Admitting: Internal Medicine

## 2023-11-16 DIAGNOSIS — R1319 Other dysphagia: Secondary | ICD-10-CM | POA: Diagnosis not present

## 2023-11-16 DIAGNOSIS — K449 Diaphragmatic hernia without obstruction or gangrene: Secondary | ICD-10-CM | POA: Diagnosis not present

## 2023-11-16 DIAGNOSIS — Z1211 Encounter for screening for malignant neoplasm of colon: Secondary | ICD-10-CM | POA: Diagnosis present

## 2023-11-16 DIAGNOSIS — K222 Esophageal obstruction: Secondary | ICD-10-CM | POA: Diagnosis not present

## 2023-11-16 DIAGNOSIS — K5730 Diverticulosis of large intestine without perforation or abscess without bleeding: Secondary | ICD-10-CM | POA: Diagnosis not present

## 2023-11-16 DIAGNOSIS — Z800 Family history of malignant neoplasm of digestive organs: Secondary | ICD-10-CM | POA: Diagnosis not present

## 2023-11-16 DIAGNOSIS — K640 First degree hemorrhoids: Secondary | ICD-10-CM | POA: Diagnosis not present

## 2023-11-16 DIAGNOSIS — K219 Gastro-esophageal reflux disease without esophagitis: Secondary | ICD-10-CM | POA: Diagnosis not present

## 2023-11-17 ENCOUNTER — Encounter (INDEPENDENT_AMBULATORY_CARE_PROVIDER_SITE_OTHER): Payer: Self-pay

## 2023-12-14 ENCOUNTER — Other Ambulatory Visit

## 2023-12-21 ENCOUNTER — Other Ambulatory Visit: Payer: Self-pay | Admitting: Internal Medicine

## 2023-12-22 ENCOUNTER — Other Ambulatory Visit: Payer: Self-pay | Admitting: Internal Medicine

## 2024-01-04 ENCOUNTER — Other Ambulatory Visit (INDEPENDENT_AMBULATORY_CARE_PROVIDER_SITE_OTHER)

## 2024-01-04 DIAGNOSIS — I10 Essential (primary) hypertension: Secondary | ICD-10-CM | POA: Diagnosis not present

## 2024-01-04 DIAGNOSIS — D649 Anemia, unspecified: Secondary | ICD-10-CM

## 2024-01-05 LAB — CBC WITH DIFFERENTIAL/PLATELET
Basophils Absolute: 0 K/uL (ref 0.0–0.1)
Basophils Relative: 0.4 % (ref 0.0–3.0)
Eosinophils Absolute: 0 K/uL (ref 0.0–0.7)
Eosinophils Relative: 1 % (ref 0.0–5.0)
HCT: 32.6 % — ABNORMAL LOW (ref 36.0–46.0)
Hemoglobin: 11 g/dL — ABNORMAL LOW (ref 12.0–15.0)
Lymphocytes Relative: 28.9 % (ref 12.0–46.0)
Lymphs Abs: 1.3 K/uL (ref 0.7–4.0)
MCHC: 33.8 g/dL (ref 30.0–36.0)
MCV: 92.8 fl (ref 78.0–100.0)
Monocytes Absolute: 0.5 K/uL (ref 0.1–1.0)
Monocytes Relative: 10.4 % (ref 3.0–12.0)
Neutro Abs: 2.7 K/uL (ref 1.4–7.7)
Neutrophils Relative %: 59.3 % (ref 43.0–77.0)
Platelets: 306 K/uL (ref 150.0–400.0)
RBC: 3.51 Mil/uL — ABNORMAL LOW (ref 3.87–5.11)
RDW: 13.8 % (ref 11.5–15.5)
WBC: 4.6 K/uL (ref 4.0–10.5)

## 2024-01-05 LAB — IBC + FERRITIN
Ferritin: 101.6 ng/mL (ref 10.0–291.0)
Iron: 113 ug/dL (ref 42–145)
Saturation Ratios: 35.7 % (ref 20.0–50.0)
TIBC: 316.4 ug/dL (ref 250.0–450.0)
Transferrin: 226 mg/dL (ref 212.0–360.0)

## 2024-01-05 LAB — B12 AND FOLATE PANEL
Folate: 13.2 ng/mL (ref >5.9)
Vitamin B-12: 373 pg/mL (ref 211–911)

## 2024-01-05 LAB — FERRITIN: Ferritin: 101.6 ng/mL (ref 10.0–291.0)

## 2024-01-06 ENCOUNTER — Ambulatory Visit: Payer: Self-pay | Admitting: Internal Medicine

## 2024-01-09 ENCOUNTER — Other Ambulatory Visit: Payer: Self-pay | Admitting: Internal Medicine

## 2024-01-09 DIAGNOSIS — D649 Anemia, unspecified: Secondary | ICD-10-CM

## 2024-01-09 NOTE — Progress Notes (Signed)
 Order placed for hematology referral.

## 2024-01-29 ENCOUNTER — Other Ambulatory Visit: Payer: Self-pay | Admitting: Internal Medicine

## 2024-02-08 ENCOUNTER — Inpatient Hospital Stay

## 2024-02-08 ENCOUNTER — Inpatient Hospital Stay: Attending: Oncology | Admitting: Oncology

## 2024-02-08 ENCOUNTER — Ambulatory Visit: Admitting: Sleep Medicine

## 2024-02-08 ENCOUNTER — Encounter: Payer: Self-pay | Admitting: Oncology

## 2024-02-08 VITALS — BP 183/96 | HR 77 | Temp 97.3°F | Resp 18 | Ht 61.0 in | Wt 141.1 lb

## 2024-02-08 DIAGNOSIS — D649 Anemia, unspecified: Secondary | ICD-10-CM | POA: Diagnosis not present

## 2024-02-08 DIAGNOSIS — Z803 Family history of malignant neoplasm of breast: Secondary | ICD-10-CM | POA: Insufficient documentation

## 2024-02-08 DIAGNOSIS — Z800 Family history of malignant neoplasm of digestive organs: Secondary | ICD-10-CM | POA: Diagnosis not present

## 2024-02-08 DIAGNOSIS — Z801 Family history of malignant neoplasm of trachea, bronchus and lung: Secondary | ICD-10-CM | POA: Insufficient documentation

## 2024-02-08 LAB — CBC WITH DIFFERENTIAL/PLATELET
Abs Immature Granulocytes: 0.01 K/uL (ref 0.00–0.07)
Basophils Absolute: 0 K/uL (ref 0.0–0.1)
Basophils Relative: 1 %
Eosinophils Absolute: 0 K/uL (ref 0.0–0.5)
Eosinophils Relative: 1 %
HCT: 32.4 % — ABNORMAL LOW (ref 36.0–46.0)
Hemoglobin: 10.9 g/dL — ABNORMAL LOW (ref 12.0–15.0)
Immature Granulocytes: 0 %
Lymphocytes Relative: 27 %
Lymphs Abs: 1.1 K/uL (ref 0.7–4.0)
MCH: 30.9 pg (ref 26.0–34.0)
MCHC: 33.6 g/dL (ref 30.0–36.0)
MCV: 91.8 fL (ref 80.0–100.0)
Monocytes Absolute: 0.3 K/uL (ref 0.1–1.0)
Monocytes Relative: 8 %
Neutro Abs: 2.7 K/uL (ref 1.7–7.7)
Neutrophils Relative %: 63 %
Platelets: 294 K/uL (ref 150–400)
RBC: 3.53 MIL/uL — ABNORMAL LOW (ref 3.87–5.11)
RDW: 13.3 % (ref 11.5–15.5)
WBC: 4.2 K/uL (ref 4.0–10.5)
nRBC: 0 % (ref 0.0–0.2)

## 2024-02-08 LAB — LACTATE DEHYDROGENASE: LDH: 227 U/L — ABNORMAL HIGH (ref 98–192)

## 2024-02-08 LAB — RETICULOCYTES
Immature Retic Fract: 5.4 % (ref 2.3–15.9)
RBC.: 3.57 MIL/uL — ABNORMAL LOW (ref 3.87–5.11)
Retic Count, Absolute: 50.3 K/uL (ref 19.0–186.0)
Retic Ct Pct: 1.4 % (ref 0.4–3.1)

## 2024-02-08 NOTE — Progress Notes (Unsigned)
 Hematology/Oncology Consult note [facility] [place] [phone] [phone]  Patient Care Team: Tina Shad, MD as PCP - General (Internal Medicine) Tina Annah BROCKS, MD as Consulting Physician (Oncology)   Name of the patient: Tina Parker  [id number]  August 17, 1970    Reason for referral- anemia   Referring physician-Dr. Shad Tina  Date of visit: 02/08/24   History of presenting illness-patient is a 53 year old female with a past medical history significant for hiatal hernia type 2 diabetes history of migraine headaches chronic venous insufficiency referred for anemia. Patient noted to have white count of 4.6, H&H of 11/32.6 and a platelet count of 308 in July 2025.  Ferritin and iron studies B12 and folate at that time were normal.  Looking back at [name] patient's hemoglobin has been around 11 at least since December 2022.  Prior to that between 20 18-20 21 his hemoglobin was closer to 12.TSH in may 2025 was normal.   ECOG PS- ***  Pain scale- ***   Review of systems- ROS  Allergies  Allergen Reactions  . Cephalexin Shortness Of Breath  . Inh [Isoniazid] Other (See Comments)    Causes hepatitis  . Oxycodone  Shortness Of Breath    Patient Active Problem List   Diagnosis Date Noted  . Cervical radiculopathy 10/13/2023  . Back pain 08/26/2023  . Constipation 07/20/2023  . Gum lesion 12/20/2022  . Acquired hallux valgus of right foot 06/27/2022  . Major osseous defect 06/27/2022  . Pre-op evaluation 06/16/2022  . H/O Graves' disease 05/19/2022  . Screening for heart disease 02/15/2022  . Hyperglycemia 12/20/2021  . Suprapubic pressure 09/30/2021  . Right hip pain 09/29/2021  . Right knee pain 09/03/2021  . Allergic rhinitis due to pollen 07/23/2021  . Atopic dermatitis 07/23/2021  . Wheezing 07/23/2021  . Multiple thyroid  nodules 05/16/2021  . Arthritis of right wrist 05/06/2021  . Body mass index (BMI)  29.0-29.9, adult 03/12/2021  . Wrist pain, right 06/24/2020  . Fracture of scaphoid bone of wrist 06/11/2020  . Acquired trigger finger of left little finger 12/12/2019  . Pain of left hand 10/17/2019  . Chronic venous insufficiency 09/28/2019  . Allergic rhinitis 09/26/2019  . Carpal tunnel syndrome 09/26/2019  . Hiatal hernia 09/26/2019  . Hx of migraine headaches 09/26/2019  . Positive PPD 09/26/2019  . Leg pain 09/12/2019  . Mild sleep apnea 08/08/2019  . Thyroid  nodule 05/18/2019  . Thyroid  fullness 05/01/2019  . Bilateral wrist pain 02/21/2019  . Radial styloid tenosynovitis 10/27/2018  . Ependymoma of brain (HCC) 07/15/2018  . Anxiety 06/08/2018  . Sinusitis 06/08/2018  . History of meningioma of the brain 12/21/2017  . Herpes 07/23/2017  . 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 (HCC) 06/25/2017  . Pain of both hip joints 05/18/2017  . Vitamin D  deficiency 09/21/2016  . Cough 01/07/2015  . Difficulty sleeping 01/07/2015  . Health care maintenance 08/02/2014  . Snoring 03/19/2014  . Dysphagia 01/15/2014  . BMI 34.0-34.9,adult 12/11/2013  . Skin lesion 12/11/2013  . Family history of breast cancer 10/22/2013  . Environmental allergies 10/22/2013  . Essential hypertension, benign 09/15/2012  . Hypercholesterolemia 09/15/2012  . Rheumatoid arthritis (HCC) 09/15/2012  . GERD (gastroesophageal reflux disease) 09/15/2012  . Family history of colon cancer 09/15/2012  . Anemia 09/15/2012  . Hypothyroidism 09/15/2012     Past Medical History:  Diagnosis Date  . Allergic rhinitis   . Anemia   . Anxiety   . Arthritis  RA  . Carpal tunnel syndrome   . Dysphagia   . Esophageal spasm 01/16/2014  . GERD (gastroesophageal reflux disease)   . Graves disease    remission, no ablation, positive medical treatment  . History of hiatal hernia   . Hypertension   . Migraine headache    migraines  . 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 Pure  hypercholesterolemia   . Rheumatoid arthritis(714.0)    positive anti CCP antibodies, positive RF, oligo-articular, MTX  . Sciatica   . Sleep apnea      Past Surgical History:  Procedure Laterality Date  . ARTHRODESIS METATARSALPHALANGEAL JOINT (MTPJ) Right 06/27/2022   Procedure: ARTHRODESIS METATARSALPHALANGEAL JOINT (MTPJ);  Surgeon: Tina Parker, DPM;  Location: [facility] ORS;  Service: Podiatry;  Laterality: Right;  . CARPAL TUNNEL RELEASE Bilateral   . CESAREAN SECTION  1996  . COLONOSCOPY  03/14/2002, 08/16/2007, 03/07/2013   [facility]-father  . COLONOSCOPY WITH PROPOFOL  N/A 06/21/2018   Procedure: COLONOSCOPY WITH PROPOFOL ;  Surgeon: Tina Lamar DASEN, MD;  Location: [facility] ENDOSCOPY;  Service: Endoscopy;  Laterality: N/A;  . CRANIOTOMY Left 06/25/2017   Procedure: CRANIOTOMY TEMPORAL LEFT FOR TUMOR RESECTION;  Surgeon: Gillie Duncans, MD;  Location: [facility] OR;  Service: Neurosurgery;  Laterality: Left;  . ESOPHAGOGASTRODUODENOSCOPY  03/14/2002, 03/07/2013  . ESOPHAGOGASTRODUODENOSCOPY (EGD) WITH PROPOFOL  N/A 06/21/2018   Procedure: ESOPHAGOGASTRODUODENOSCOPY (EGD) WITH PROPOFOL ;  Surgeon: Tina Lamar DASEN, MD;  Location: [facility] END[facility]COPY;  Service: Endoscopy;  Laterality: N/A;  . ESOPHAGOGASTRODUODENOSCOPY (EGD) WITH PROPOFOL  N/A 07/15/2019   Procedure: ESOPHAGOGASTRODUODENOSCOPY (EGD) WITH PROPOFOL ;  Surgeon: Tina Bi, MD;  Location: [facility] ENDOSCOPY;  Service: Gastroenterology;  Laterality: N/A;  . FOOT ARTHRODESIS Right 06/27/2022   Procedure: ARTHRODESIS FOOT;  Surgeon: Tina Parker, DPM;  Location: [facility] ORS;  Service: Podiatry;  Laterality: Right;  . Post Septoplasty and turbinate reduction  2007  . TRIGGER FINGER RELEASE      Social History   Socioeconomic History  . Marital status: Married    Spouse name: Tina Parker  . Number of children: 2  . Years of education: Not on file  . Highest education level: Not on file  Occupational History  . Occupation: Hairstylist  Tobacco Use  .  Smoking status: Never  . Smokeless tobacco: Never  Vaping Use  . Vaping status: Never Used  Substance and Sexual Activity  . Alcohol use: Not Currently    Comment: wine cooler 2 drinks 2x a week/ OCCASIONALLY  . Drug use: No  . Sexual activity: Not on file  Other Topics Concern  . Not on file  Social History Narrative   Hairdresser    Social Drivers of Health   Financial Resource Strain: Low Risk  (09/04/2023)   Received from [facility]   Overall Financial Resource Strain (CARDIA)   . Difficulty of Paying Living Expenses: Not hard at all  Food Insecurity: No Food Insecurity (09/04/2023)   Received from [facility]   Hunger Vital Sign   . Within the past 12 months, you worried that your food would run out before you got the money to buy more.: Never true   . Within the past 12 months, the food you bought just didn't last and you didn't have money to get more.: Never true  Transportation Needs: No Transportation Needs (09/04/2023)   Received from [facility]   [facility] - Transportation   . In the past 12 months, has lack of transportation kept you  from medical appointments or from getting medications?: No   . Lack of Transportation (Non-Medical): No  Physical Activity: Insufficiently Active (11/01/2020)   Exercise Vital Sign   . Days of Exercise per Week: 2 days   . Minutes of Exercise per Session: 20 min  Stress: Not on file  Social Connections: Not on file  Intimate Partner Violence: Not on file     Family History  Problem Relation Age of Onset  . Cancer Mother        Breast Cancer  . Breast cancer Mother 15  . Cancer Father        Colon Cancer  . Heart disease Father        Hx of MI  . Hypertension Father   . Diabetes Father   . Hyperlipidemia Father   . Cancer Maternal Grandmother        lung cancer  . Cancer Maternal Uncle        esophageal     Current Outpatient Medications:  .  albuterol  (VENTOLIN  HFA) 108  (90 Base) MCG/ACT inhaler, 2 puffs Inhalation q 4-6 hours prn cough/wheeze; Duration: 90 days, Disp: , Rfl:  .  Azelastine -Fluticasone 137-50 MCG/ACT SUSP, Place 1 spray into both nostrils as needed., Disp: , Rfl:  .  budesonide -formoterol  (SYMBICORT ) 80-4.5 MCG/ACT inhaler, 1-2 puffs twice daily (rinse mouth after use), Disp: 1 each, Rfl: 5 .  busPIRone  (BUSPAR ) 7.5 MG tablet, Take 1 tablet (7.5 mg total) by mouth 2 (two) times daily., Disp: 60 tablet, Rfl: 2 .  calcium carbonate (OSCAL) 1500 (600 Ca) MG TABS tablet, Take 1 tablet by mouth daily., Disp: , Rfl:  .  citalopram  (CELEXA ) 40 MG tablet, TAKE 1 TABLET(40 MG) BY MOUTH DAILY, Disp: 30 tablet, Rfl: 5 .  desloratadine (CLARINEX) 5 MG tablet, Take 5 mg by mouth daily., Disp: , Rfl:  .  folic acid  (FOLVITE ) 1 MG tablet, Take 1 mg by mouth daily., Disp: , Rfl:  .  levocetirizine (XYZAL  ALLERGY 24HR) 5 MG tablet, 0.5 tablet in the evening Orally Once a day; Duration: 30 day(s), Disp: , Rfl:  .  losartan  (COZAAR ) 100 MG tablet, TAKE 1 TABLET BY MOUTH DAILY, Disp: 90 tablet, Rfl: 0 .  methotrexate  (RHEUMATREX) 2.5 MG tablet, Take 22.5 mg by mouth every Monday. Caution:Chemotherapy. Protect from light. Take 9 tabs po weekly with meals, Disp: , Rfl:  .  pantoprazole  (PROTONIX ) 40 MG tablet, Take 1 tablet (40 mg total) by mouth daily., Disp: 30 tablet, Rfl: 2 .  RINVOQ 15 MG TB24, Take 1 tablet by mouth daily., Disp: , Rfl:  .  Semaglutide -Weight Management (WEGOVY ) 1 MG/0.5ML SOAJ, Inject 1 mg into the skin once a week., Disp: 2 mL, Rfl: 4 .  Tocilizumab (ACTEMRA) 162 MG/0.9ML SOSY, 0.9 ml Subcutaneous; Duration: 30 day(s), Disp: , Rfl:  .  valACYclovir  (VALTREX ) 500 MG tablet, TAKE 1 TABLET BY MOUTH TWICE DAILY FOR 3 DAYS. CONTINUE 1 BY MOUTH EVERY DAY, Disp: 30 tablet, Rfl: 2 .  amLODipine  (NORVASC ) 5 MG tablet, Oral; Duration: 30 (Patient not taking: Reported on 02/08/2024), Disp: , Rfl:  .  dexlansoprazole  (DEXILANT ) 60 MG capsule, 1 capsule.  (Patient not taking: Reported on 02/08/2024), Disp: , Rfl:  .  montelukast  (SINGULAIR ) 10 MG tablet, 1 tablet Oral once a day; Duration: 30 (Patient not taking: Reported on 02/08/2024), Disp: , Rfl:   Current Facility-Administered Medications:  .  betamethasone  acetate-betamethasone  sodium phosphate  (CELESTONE ) injection 12 mg, 12 mg, Other, Once, Eldonna Novel, MD  Physical exam: There were no vitals filed for this visit. Physical Exam        Latest Ref Rng & Units 11/09/2023    8:31 AM  CMP  Glucose 70 - 99 mg/dL 85   BUN 6 - 23 mg/dL 12   Creatinine 9.59 - 1.20 mg/dL 9.11   Sodium 864 - 854 mEq/L 136   Potassium 3.5 - 5.1 mEq/L 4.5   Chloride 96 - 112 mEq/L 102   CO2 19 - 32 mEq/L 29   Calcium 8.4 - 10.5 mg/dL 9.4   Total Protein 6.0 - 8.3 g/dL 7.3   Total Bilirubin 0.2 - 1.2 mg/dL 0.5   Alkaline Phos 39 - 117 U/L 89   AST 0 - 37 U/L 24   ALT 0 - 35 U/L 19       Latest Ref Rng & Units 01/04/2024    3:01 PM  CBC  WBC 4.0 - 10.5 K/uL 4.6   Hemoglobin 12.0 - 15.0 g/dL 88.9   Hematocrit 63.9 - 46.0 % 32.6   Platelets 150.0 - 400.0 K/uL 306.0       Assessment and plan- Patient is a 53 y.o. female ***   Thank you for this kind referral and the opportunity to participate in the care of this  Patient   Visit Diagnosis No diagnosis found.  Dr. Annah Skene, MD, MPH [facility] [phone] 02/08/2024

## 2024-02-08 NOTE — Progress Notes (Signed)
Hematology/Oncology Consult note [facility] [phone] [phone]) [phone]  Patient Care Team: Glendia Shad, MD as PCP - General (Internal Medicine) Melanee Annah BROCKS, MD as Consulting Physician (Oncology)   Name of the patient: Tina Parker  [id number]  1971/02/02    Reason for referral- anemia   Referring physician-Dr. Shad Glendia  Date of visit: 02/08/24   History of presenting illness-patient is a 53 year old female with a past medical history significant for hiatal hernia type 2 diabetes history of migraine headaches chronic venous insufficiency referred for anemia. Patient noted to have white count of 4.6, H&H of 11/32.6 and a platelet count of 308 in July 2025.  Ferritin and iron studies B12 and folate at that time were normal.  Looking back at [name] patient's hemoglobin has been around 11 at least since December 2022.  Prior to that between 20 18-20 21 his hemoglobin was closer to 12.TSH in may 2025 was normal.   [name] is doing well overall.  Denies any changes in her appetite or weight.  She has been active and working out as well but does report feeling more fatigued as compared to usual.  ECOG PS- 0  Pain scale- 0   Review of systems- Review of Systems  Constitutional:  Positive for malaise/fatigue. Negative for chills, fever and weight loss.  HENT:  Negative for congestion, ear discharge and nosebleeds.   Eyes:  Negative for blurred vision.  Respiratory:  Negative for cough, hemoptysis, sputum production, shortness of breath and wheezing.   Cardiovascular:  Negative for chest pain, palpitations, orthopnea and claudication.  Gastrointestinal:  Negative for abdominal pain, blood in stool, constipation, diarrhea, heartburn, melena, nausea and vomiting.  Genitourinary:  Negative for dysuria, flank pain, frequency, hematuria and urgency.  Musculoskeletal:  Negative for back pain, joint pain and myalgias.  Skin:  Negative  for rash.  Neurological:  Negative for dizziness, tingling, focal weakness, seizures, weakness and headaches.  Endo/Heme/Allergies:  Does not bruise/bleed easily.  Psychiatric/Behavioral:  Negative for depression and suicidal ideas. The patient does not have insomnia.     Allergies  Allergen Reactions   Cephalexin Shortness Of Breath   Inh [Isoniazid] Other (See Comments)    Causes hepatitis   Oxycodone  Shortness Of Breath    Patient Active Problem List   Diagnosis Date Noted   Cervical radiculopathy 10/13/2023   Back pain 08/26/2023   Constipation 07/20/2023   Gum lesion 12/20/2022   Acquired hallux valgus of right foot 06/27/2022   Major osseous defect 06/27/2022   Pre-op evaluation 06/16/2022   H/O Graves' disease 05/19/2022   Screening for heart disease 02/15/2022   Hyperglycemia 12/20/2021   Suprapubic pressure 09/30/2021   Right hip pain 09/29/2021   Right knee pain 09/03/2021   Allergic rhinitis due to pollen 07/23/2021   Atopic dermatitis 07/23/2021   Wheezing 07/23/2021   Multiple thyroid  nodules 05/16/2021   Arthritis of right wrist 05/06/2021   Body mass index (BMI) 29.0-29.9, adult 03/12/2021   Wrist pain, right 06/24/2020   Fracture of scaphoid bone of wrist 06/11/2020   Acquired trigger finger of left little finger 12/12/2019   Pain of left hand 10/17/2019   Chronic venous insufficiency 09/28/2019   Allergic rhinitis 09/26/2019   Carpal tunnel syndrome 09/26/2019   Hiatal hernia 09/26/2019   Hx of migraine headaches 09/26/2019   Positive PPD 09/26/2019   Leg pain 09/12/2019   Mild sleep apnea 08/08/2019   Thyroid  nodule 05/18/2019   Thyroid  fullness 05/01/2019  Bilateral wrist pain 02/21/2019   Radial styloid tenosynovitis 10/27/2018   Ependymoma of brain (HCC) 07/15/2018   Anxiety 06/08/2018   Sinusitis 06/08/2018   History of meningioma of the brain 12/21/2017   Herpes 07/23/2017   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  (HCC) 06/25/2017   Pain of both hip joints 05/18/2017   Vitamin D  deficiency 09/21/2016   Cough 01/07/2015   Difficulty sleeping 01/07/2015   Health care maintenance 08/02/2014   Snoring 03/19/2014   Dysphagia 01/15/2014   BMI 34.0-34.9,adult 12/11/2013   Skin lesion 12/11/2013   Family history of breast cancer 10/22/2013   Environmental allergies 10/22/2013   Essential hypertension, benign 09/15/2012   Hypercholesterolemia 09/15/2012   Rheumatoid arthritis (HCC) 09/15/2012   GERD (gastroesophageal reflux disease) 09/15/2012   Family history of colon cancer 09/15/2012   Anemia 09/15/2012   Hypothyroidism 09/15/2012     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   Pure hypercholesterolemia    Rheumatoid arthritis(714.0)    positive anti CCP antibodies, positive RF, oligo-articular, MTX   Sciatica    Sleep apnea      Past Surgical History:  Procedure Laterality Date   ARTHRODESIS METATARSALPHALANGEAL JOINT (MTPJ) Right 06/27/2022   Procedure: ARTHRODESIS METATARSALPHALANGEAL JOINT (MTPJ);  Surgeon: Silva Juliene SAUNDERS, DPM;  Location: [facility] ORS;  Service: Podiatry;  Laterality: Right;   CARPAL TUNNEL RELEASE Bilateral    CESAREAN SECTION  1996   COLONOSCOPY  03/14/2002, 08/16/2007, 03/07/2013   [facility]-father   COLONOSCOPY WITH PROPOFOL  N/A 06/21/2018   Procedure: COLONOSCOPY WITH PROPOFOL ;  Surgeon: Viktoria Lamar DASEN, MD;  Location: [facility]OSCOPY;  Service: Endoscopy;  Laterality: N/A;   CRANIOTOMY Left 06/25/2017   Procedure: CRANIOTOMY TEMPORAL LEFT FOR TUMOR RESECTION;  Surgeon: Gillie Duncans, MD;  Location: [facility]  OR;  Service: Neurosurgery;  Laterality: Left;   ESOPHAGOGASTRODUODENOSCOPY  03/14/2002, 03/07/2013   ESOPHAGOGASTRODUODENOSCOPY (EGD) WITH PROPOFOL  N/A 06/21/2018   Procedure: ESOPHAGOGASTRODUODENOSCOPY (EGD) WITH PROPOFOL ;  Surgeon: Viktoria Lamar DASEN, MD;  Location: [facility] ENDOSCOPY;  Service: Endoscopy;  Laterality: N/A;   ESOPHAGOGASTRODUODENOSCOPY (EGD) WITH PROPOFOL  N/A 07/15/2019   Procedure: ESOPHAGOGASTRODUODENOSCOPY (EGD) WITH PROPOFOL ;  Surgeon: Therisa Bi, MD;  Location: [facility] ENDOSCOPY;  Service: Gastroenterology;  Laterality: N/A;   FOOT ARTHRODESIS Right 06/27/2022   Procedure: ARTHRODESIS FOOT;  Surgeon: Silva Juliene SAUNDERS, DPM;  Location: [facility] ORS;  Service: Podiatry;  Laterality: Right;   Post Septoplasty and turbinate reduction  2007   TRIGGER FINGER RELEASE      Social History   Socioeconomic History   Marital status: Married    Spouse name: ERIC   Number of children: 2   Years of education: Not on file   Highest education level: Not on file  Occupational History   Occupation: Hairstylist  Tobacco Use   Smoking status: Never   Smokeless tobacco: Never  Vaping Use   Vaping status: Never Used  Substance and Sexual Activity   Alcohol use: Not Currently    Comment: wine cooler 2 drinks 2x a  week/ OCCASIONALLY   Drug use: No   Sexual activity: Not on file  Other Topics Concern   Not on file  Social History Narrative   Hairdresser    Social Drivers of Health   Financial Resource Strain: Low Risk  (09/04/2023)   Received from [facility]   Overall Financial Resource Strain (CARDIA)    Difficulty of Paying Living Expenses: Not hard at all  Food Insecurity: No Food Insecurity (09/04/2023)   Received from [facility]   Hunger Vital Sign    Within the past 12 months, you worried that your food would run out before you got the money to buy more.: Never true    Within the past 12 months, the food you bought just didn't last and you didn't  have money to get more.: Never true  Transportation Needs: No Transportation Needs (09/04/2023)   Received from [facility] - Transportation    In the past 12 months, has lack of transportation kept you from medical appointments or from getting medications?: No    Lack of Transportation (Non-Medical): No  Physical Activity: Insufficiently Active (11/01/2020)   Exercise Vital Sign    Days of Exercise per Week: 2 days    Minutes of Exercise per Session: 20 min  Stress: Not on file  Social Connections: Not on file  Intimate Partner Violence: Not on file     Family History  Problem Relation Age of Onset   Cancer Mother        Breast Cancer   Breast cancer Mother 89   Cancer Father        Colon Cancer   Heart disease Father        Hx of MI   Hypertension Father    Diabetes Father    Hyperlipidemia Father    Cancer Maternal Grandmother        lung cancer   Cancer Maternal Uncle        esophageal     Current Outpatient Medications:    albuterol  (VENTOLIN  HFA) 108 (90 Base) MCG/ACT inhaler, 2 puffs Inhalation q 4-6 hours prn cough/wheeze; Duration: 90 days, Disp: , Rfl:    Azelastine -Fluticasone 137-50 MCG/ACT SUSP, Place 1 spray into both nostrils as needed., Disp: , Rfl:    budesonide -formoterol  (SYMBICORT ) 80-4.5 MCG/ACT inhaler, 1-2 puffs twice daily (rinse mouth after use), Disp: 1 each, Rfl: 5   busPIRone  (BUSPAR ) 7.5 MG tablet, Take 1 tablet (7.5 mg total) by mouth 2 (two) times daily., Disp: 60 tablet, Rfl: 2   calcium carbonate (OSCAL) 1500 (600 Ca) MG TABS tablet, Take 1 tablet by mouth daily., Disp: , Rfl:    citalopram  (CELEXA ) 40 MG tablet, TAKE 1 TABLET(40 MG) BY MOUTH DAILY, Disp: 30 tablet, Rfl: 5   desloratadine (CLARINEX) 5 MG tablet, Take 5 mg by mouth daily., Disp: , Rfl:    folic acid  (FOLVITE ) 1 MG tablet, Take 1 mg by mouth daily., Disp: , Rfl:    levocetirizine (XYZAL  ALLERGY 24HR) 5 MG tablet, 0.5 tablet in the evening Orally Once a  day; Duration: 30 day(s), Disp: , Rfl:    losartan  (COZAAR ) 100 MG tablet, TAKE 1 TABLET BY MOUTH DAILY, Disp: 90 tablet, Rfl: 0   methotrexate  (RHEUMATREX) 2.5 MG tablet, Take 22.5 mg by mouth every Monday. Caution:Chemotherapy. Protect from light. Take 9 tabs po weekly with meals, Disp: , Rfl:    pantoprazole  (PROTONIX ) 40 MG tablet, Take 1 tablet (40 mg  total) by mouth daily., Disp: 30 tablet, Rfl: 2   RINVOQ 15 MG TB24, Take 1 tablet by mouth daily., Disp: , Rfl:    Semaglutide -Weight Management (WEGOVY ) 1 MG/0.5ML SOAJ, Inject 1 mg into the skin once a week., Disp: 2 mL, Rfl: 4   Tocilizumab (ACTEMRA) 162 MG/0.9ML SOSY, 0.9 ml Subcutaneous; Duration: 30 day(s), Disp: , Rfl:    valACYclovir  (VALTREX ) 500 MG tablet, TAKE 1 TABLET BY MOUTH TWICE DAILY FOR 3 DAYS. CONTINUE 1 BY MOUTH EVERY DAY, Disp: 30 tablet, Rfl: 2   amLODipine  (NORVASC ) 5 MG tablet, Oral; Duration: 30 (Patient not taking: Reported on 02/08/2024), Disp: , Rfl:    dexlansoprazole  (DEXILANT ) 60 MG capsule, 1 capsule. (Patient not taking: Reported on 02/08/2024), Disp: , Rfl:    montelukast  (SINGULAIR ) 10 MG tablet, 1 tablet Oral once a day; Duration: 30 (Patient not taking: Reported on 02/08/2024), Disp: , Rfl:   Current Facility-Administered Medications:    betamethasone  acetate-betamethasone  sodium phosphate  (CELESTONE ) injection 12 mg, 12 mg, Other, Once, Eldonna Novel, MD   Physical exam: There were no vitals filed for this visit. Physical Exam Cardiovascular:     Rate and Rhythm: Normal rate and regular rhythm.     Heart sounds: Normal heart sounds.  Pulmonary:     Effort: Pulmonary effort is normal.     Breath sounds: Normal breath sounds.  Abdominal:     General: Bowel sounds are normal.     Palpations: Abdomen is soft.  Lymphadenopathy:     Comments: No palpable cervical, supraclavicular, axillary or inguinal adenopathy    Skin:    General: Skin is warm and dry.  Neurological:     Mental Status: She is  alert and oriented to person, place, and time.           Latest Ref Rng & Units 11/09/2023    8:31 AM  CMP  Glucose 70 - 99 mg/dL 85   BUN 6 - 23 mg/dL 12   Creatinine 9.59 - 1.20 mg/dL 9.11   Sodium 864 - 854 mEq/L 136   Potassium 3.5 - 5.1 mEq/L 4.5   Chloride 96 - 112 mEq/L 102   CO2 19 - 32 mEq/L 29   Calcium 8.4 - 10.5 mg/dL 9.4   Total Protein 6.0 - 8.3 g/dL 7.3   Total Bilirubin 0.2 - 1.2 mg/dL 0.5   Alkaline Phos 39 - 117 U/L 89   AST 0 - 37 U/L 24   ALT 0 - 35 U/L 19       Latest Ref Rng & Units 01/04/2024    3:01 PM  CBC  WBC 4.0 - 10.5 K/uL 4.6   Hemoglobin 12.0 - 15.0 g/dL 88.9   Hematocrit 63.9 - 46.0 % 32.6   Platelets 150.0 - 400.0 K/uL 306.0       Assessment and plan- Patient is a 53 y.o. female referred for normocytic anemia  Patient's hemoglobin has remained stable around 11 for the last 3 to 4 years without clear downward trend.  Recent iron studies B12 and folate were normal.  I am checking myeloma panel serum free light chains haptoglobin reticulocyte count and LDH.  I will see her back in 2 weeks time to discuss the results of blood work and further management   Thank you for this kind referral and the opportunity to participate in the care of this  Patient   Visit Diagnosis No diagnosis found.  Dr. Annah Skene, MD, MPH [facility]  [phone] 02/08/2024                

## 2024-02-08 NOTE — Progress Notes (Signed)
 New patient anemia, unspecified type- referred by Dr. Allena Hamilton.

## 2024-02-09 ENCOUNTER — Encounter: Payer: Self-pay | Admitting: Sleep Medicine

## 2024-02-09 ENCOUNTER — Ambulatory Visit: Admitting: Sleep Medicine

## 2024-02-09 VITALS — BP 130/80 | HR 79 | Temp 98.0°F | Ht 61.0 in | Wt 143.0 lb

## 2024-02-09 DIAGNOSIS — G4733 Obstructive sleep apnea (adult) (pediatric): Secondary | ICD-10-CM

## 2024-02-09 DIAGNOSIS — I10 Essential (primary) hypertension: Secondary | ICD-10-CM

## 2024-02-09 LAB — KAPPA/LAMBDA LIGHT CHAINS
Kappa free light chain: 30.8 mg/L — ABNORMAL HIGH (ref 3.3–19.4)
Kappa, lambda light chain ratio: 1.47 (ref 0.26–1.65)
Lambda free light chains: 20.9 mg/L (ref 5.7–26.3)

## 2024-02-09 NOTE — Patient Instructions (Addendum)

## 2024-02-09 NOTE — Progress Notes (Signed)
 Name:Tina Parker MRN: [id number] DOB: 28-Apr-1971   CHIEF COMPLAINT:  CPAP F/U   HISTORY OF PRESENT ILLNESS:  Mr. Czerwinski is a 53 y.o. w/ a h/o OSA, HTN and RA who presents for CPAP follow up visit. Reports difficulty using CPAP therapy due to unclear reasons. Reports feeling more refreshed upon awakening when she is able to use CPAP the entire night.    PAST MEDICAL HISTORY :   has a past medical history of Allergic rhinitis, Anemia, Anxiety, Arthritis, Carpal tunnel syndrome, Dysphagia, Esophageal spasm (01/16/2014), GERD (gastroesophageal reflux disease), Graves disease, History of hiatal hernia, Hypertension, Migraine headache, PPD positive, Pure hypercholesterolemia, Rheumatoid arthritis(714.0), Sciatica, and Sleep apnea.  has a past surgical history that includes Cesarean section (1996); Post Septoplasty and turbinate reduction (2007); Trigger finger release; Carpal tunnel release (Bilateral); Craniotomy (Left, 06/25/2017); Colonoscopy (03/14/2002, 08/16/2007, 03/07/2013); Esophagogastroduodenoscopy (03/14/2002, 03/07/2013); Colonoscopy with propofol  (N/A, 06/21/2018); Esophagogastroduodenoscopy (egd) with propofol  (N/A, 06/21/2018); Esophagogastroduodenoscopy (egd) with propofol  (N/A, 07/15/2019); Arthrodesis metatarsalphalangeal joint (mtpj) (Right, 06/27/2022); and Foot arthrodesis (Right, 06/27/2022). Prior to Admission medications   Medication Sig Start Date End Date Taking? Authorizing Provider  albuterol  (VENTOLIN  HFA) 108 (90 Base) MCG/ACT inhaler 2 puffs Inhalation q 4-6 hours prn cough/wheeze; Duration: 90 days   Yes [provider]  Azelastine -Fluticasone 137-50 MCG/ACT SUSP Place 1 spray into both nostrils as needed. 07/10/21  Yes [provider]  budesonide -formoterol  (SYMBICORT ) 80-4.5 MCG/ACT inhaler 1-2 puffs twice daily (rinse mouth after use) 09/14/23  Yes Glendia Shad, MD  busPIRone  (BUSPAR ) 7.5 MG tablet Take 1 tablet (7.5 mg total) by mouth 2  (two) times daily. 09/14/23  Yes Glendia Shad, MD  calcium carbonate (OSCAL) 1500 (600 Ca) MG TABS tablet Take 1 tablet by mouth daily.   Yes [provider]  citalopram  (CELEXA ) 40 MG tablet TAKE 1 TABLET(40 MG) BY MOUTH DAILY 09/14/23  Yes Glendia Shad, MD  desloratadine (CLARINEX) 5 MG tablet Take 5 mg by mouth daily. 07/09/21  Yes [provider]  folic acid  (FOLVITE ) 1 MG tablet Take 1 mg by mouth daily. 03/10/19  Yes [provider]  hydroxychloroquine (PLAQUENIL) 200 MG tablet Take 200 mg by mouth daily. 02/08/24  Yes [provider]  levocetirizine (XYZAL  ALLERGY 24HR) 5 MG tablet 0.5 tablet in the evening Orally Once a day; Duration: 30 day(s)   Yes [provider]  losartan  (COZAAR ) 100 MG tablet TAKE 1 TABLET BY MOUTH DAILY 01/29/24  Yes Glendia Shad, MD  methotrexate  (RHEUMATREX) 2.5 MG tablet Take 22.5 mg by mouth every Monday. Caution:Chemotherapy. Protect from light. Take 9 tabs po weekly with meals   Yes [provider]  pantoprazole  (PROTONIX ) 40 MG tablet Take 1 tablet (40 mg total) by mouth daily. 07/20/23  Yes Glendia Shad, MD  RINVOQ 15 MG TB24 Take 1 tablet by mouth daily. 08/26/21  Yes [provider]  Semaglutide -Weight Management (WEGOVY ) 1 MG/0.5ML SOAJ Inject 1 mg into the skin once a week. 03/19/23  Yes Glendia Shad, MD  Tocilizumab (ACTEMRA) 162 MG/0.9ML SOSY 0.9 ml Subcutaneous; Duration: 30 day(s) 08/22/19  Yes [provider]  valACYclovir  (VALTREX ) 500 MG tablet TAKE 1 TABLET BY MOUTH TWICE DAILY FOR 3 DAYS. CONTINUE 1 BY MOUTH EVERY DAY 12/22/23  Yes Glendia Shad, MD  amLODipine  (NORVASC ) 5 MG tablet Oral; Duration: 30 Patient not taking: Reported on 02/09/2024    [provider]  dexlansoprazole  (DEXILANT ) 60 MG capsule 1 capsule. Patient not taking: Reported on 02/09/2024  [provider]  montelukast  (SINGULAIR ) 10 MG tablet 1 tablet Oral once a day; Duration:  30 Patient not taking: Reported on 02/09/2024    [provider]   Allergies  Allergen Reactions   Cephalexin Shortness Of Breath   Inh [Isoniazid] Other (See Comments)    Causes hepatitis   Oxycodone  Shortness Of Breath    FAMILY HISTORY:  family history includes Breast cancer (age of onset: 43) in her mother; Cancer in her father, maternal grandmother, maternal uncle, and mother; Diabetes in her father; Healthy in her brother and brother; Heart disease in her father, paternal grandfather, and paternal grandmother; Hyperlipidemia in her father; Hypertension in her father; Stroke in her paternal grandmother; Thyroid  cancer (age of onset: 33) in her daughter. SOCIAL HISTORY:  reports that she has never smoked. She has never used smokeless tobacco. She reports that she does not currently use alcohol. She reports that she does not use drugs.   Review of Systems:  Gen:  Denies  fever, sweats, chills weight loss  HEENT: Denies blurred vision, double vision, ear pain, eye pain, hearing loss, nose bleeds, sore throat Cardiac:  No dizziness, chest pain or heaviness, chest tightness,edema, No JVD Resp:   No cough, -sputum production, -shortness of breath,-wheezing, -hemoptysis,  Gi: Denies swallowing difficulty, stomach pain, nausea or vomiting, diarrhea, constipation, bowel incontinence Gu:  Denies bladder incontinence, burning urine Ext:   Denies Joint pain, stiffness or swelling Skin: Denies  skin rash, easy bruising or bleeding or hives Endoc:  Denies polyuria, polydipsia , polyphagia or weight change Psych:   Denies depression, insomnia or hallucinations  Other:  All other systems negative  VITAL SIGNS: BP 130/80 (BP Location: Right Arm, Patient Position: Sitting, Cuff Size: Normal)   Pulse 79   Temp 98 F (36.7 C) (Oral)   Ht 5' 1 (1.549 m)   Wt 143 lb (64.9 kg)   LMP  (LMP Unknown)   SpO2 99%   BMI 27.02 kg/m    Physical Examination:   General Appearance: No  distress  EYES PERRLA, EOM intact.   NECK Supple, No JVD Pulmonary: normal breath sounds, No wheezing.  CardiovascularNormal S1,S2.  No m/r/g.   Abdomen: Benign, Soft, non-tender. Skin:   warm, no rashes, no ecchymosis  Extremities: normal, no cyanosis, clubbing. Neuro:without focal findings,  speech normal  PSYCHIATRIC: Mood, affect within normal limits.   ASSESSMENT AND PLAN  OSA Patient is using and benefiting from CPAP therapy. Counseled patient on increasing CPAP compliance. Discussed the consequences of untreated sleep apnea. Advised not to drive drowsy for safety of patient and others. Will follow up in 3 months.    HTN Stable, on current management. Following with PCP.    Patient  satisfied with Plan of action and management. All questions answered  I spent a total of 24 minutes reviewing chart data, face-to-face evaluation with the patient, counseling and coordination of care as detailed above.    Keliah Harned, M.D.  Sleep Medicine [facility]

## 2024-02-10 LAB — HAPTOGLOBIN: Haptoglobin: 129 mg/dL (ref 33–346)

## 2024-02-11 LAB — MULTIPLE MYELOMA PANEL, SERUM
Albumin SerPl Elph-Mcnc: 3.3 g/dL (ref 2.9–4.4)
Albumin/Glob SerPl: 1 (ref 0.7–1.7)
Alpha 1: 0.2 g/dL (ref 0.0–0.4)
Alpha2 Glob SerPl Elph-Mcnc: 0.8 g/dL (ref 0.4–1.0)
B-Globulin SerPl Elph-Mcnc: 1.3 g/dL (ref 0.7–1.3)
Gamma Glob SerPl Elph-Mcnc: 1.4 g/dL (ref 0.4–1.8)
Globulin, Total: 3.6 g/dL (ref 2.2–3.9)
IgA: 565 mg/dL — ABNORMAL HIGH (ref 87–352)
IgG (Immunoglobin G), Serum: 1332 mg/dL (ref 586–1602)
IgM (Immunoglobulin M), Srm: 241 mg/dL — ABNORMAL HIGH (ref 26–217)
Total Protein ELP: 6.9 g/dL (ref 6.0–8.5)

## 2024-02-12 ENCOUNTER — Other Ambulatory Visit: Payer: Self-pay | Admitting: Neurosurgery

## 2024-02-12 DIAGNOSIS — D329 Benign neoplasm of meninges, unspecified: Secondary | ICD-10-CM

## 2024-02-22 ENCOUNTER — Ambulatory Visit
Admission: RE | Admit: 2024-02-22 | Discharge: 2024-02-22 | Disposition: A | Discharge status: Home / Self Care | Source: Ambulatory Visit | Attending: Neurosurgery | Admitting: Neurosurgery

## 2024-02-22 DIAGNOSIS — D329 Benign neoplasm of meninges, unspecified: Secondary | ICD-10-CM

## 2024-02-22 MED ORDER — GADOPICLENOL 0.5 MMOL/ML IV SOLN
7.0000 mL | Freq: Once | INTRAVENOUS | Status: AC | PRN
  Administered 2024-02-22: 7 mL via INTRAVENOUS

## 2024-02-25 ENCOUNTER — Inpatient Hospital Stay: Admitting: Oncology

## 2024-03-03 ENCOUNTER — Inpatient Hospital Stay: Attending: Oncology | Admitting: Oncology

## 2024-03-03 ENCOUNTER — Other Ambulatory Visit: Payer: Self-pay

## 2024-03-03 DIAGNOSIS — D649 Anemia, unspecified: Secondary | ICD-10-CM

## 2024-03-03 NOTE — Progress Notes (Signed)
 Vit B12 1000mcg and temporarily taking prednisone for RA flare.  Has sleep apnea, uses CPAP.  Pain 3/10 d/t RA in hands; Constant pain but getting better.  Had brain MRI done as regular f/u.

## 2024-03-06 NOTE — Progress Notes (Signed)
 I connected with Tina Parker on 03/06/24 at  9:00 AM EDT by video enabled telemedicine visit and verified that I am speaking with the correct person using two identifiers.   I discussed the limitations, risks, security and privacy concerns of performing an evaluation and management service by telemedicine and the availability of in-person appointments. I also discussed with the patient that there may be a patient responsible charge related to this service. The patient expressed understanding and agreed to proceed.  Other persons participating in the visit and their role in the encounter:  none  Patient's location:  home Provider's location:  home  Chief Complaint: Results of blood work  History of present illness: patient is a 53 year old female with a past medical history significant for hiatal hernia type 2 diabetes history of migraine headaches chronic venous insufficiency referred for anemia. Patient noted to have white count of 4.6, H&H of 11/32.6 and a platelet count of 308 in July 2025.  Ferritin and iron studies B12 and folate at that time were normal.  Looking back at [name] patient's hemoglobin has been around 11 at least since December 2022.  Prior to that between 20 18-20 21 his hemoglobin was closer to 12.TSH in may 2025 was normal.   Results of blood work from 02/08/2024 showed an H&H of 10.9/32.4 LDH was normal.  Myeloma panel showed polyclonal increase in immunoglobulins but no evidence of M protein.  Serum free light chain ratio was normal.  Reticulocyte count normal.  Interval history no acute issues since last visit.  Overall she is doing well   Review of Systems  Constitutional:  Negative for chills, fever, malaise/fatigue and weight loss.  HENT:  Negative for congestion, ear discharge and nosebleeds.   Eyes:  Negative for blurred vision.  Respiratory:  Negative for cough, hemoptysis, sputum production, shortness of breath and wheezing.   Cardiovascular:  Negative  for chest pain, palpitations, orthopnea and claudication.  Gastrointestinal:  Negative for abdominal pain, blood in stool, constipation, diarrhea, heartburn, melena, nausea and vomiting.  Genitourinary:  Negative for dysuria, flank pain, frequency, hematuria and urgency.  Musculoskeletal:  Negative for back pain, joint pain and myalgias.  Skin:  Negative for rash.  Neurological:  Negative for dizziness, tingling, focal weakness, seizures, weakness and headaches.  Endo/Heme/Allergies:  Does not bruise/bleed easily.  Psychiatric/Behavioral:  Negative for depression and suicidal ideas. The patient does not have insomnia.     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   Pure hypercholesterolemia    Rheumatoid arthritis(714.0)    positive anti CCP antibodies, positive RF, oligo-articular, MTX   Sciatica    Sleep apnea     Past Surgical History:  Procedure Laterality Date   ARTHRODESIS METATARSALPHALANGEAL JOINT (MTPJ) Right 06/27/2022   Procedure: ARTHRODESIS METATARSALPHALANGEAL JOINT (MTPJ);  Surgeon: Silva Juliene SAUNDERS, DPM;  Location: [facility] ORS;  Service: Podiatry;  Laterality: Right;   CARPAL TUNNEL RELEASE Bilateral    CESAREAN SECTION  1996   COLONOSCOPY  03/14/2002, 08/16/2007, 03/07/2013   [facility]-father   COLONOSCOPY WITH PROPOFOL  N/A 06/21/2018   Procedure: COLONOSCOPY WITH PROPOFOL ;  Surgeon: Viktoria Lamar DASEN, MD;  Location: [facility] ENDOSCOPY;  Service:  Endoscopy;  Laterality: N/A;   CRANIOTOMY Left 06/25/2017   Procedure: CRANIOTOMY TEMPORAL LEFT FOR TUMOR RESECTION;  Surgeon: Gillie Duncans, MD;  Location: [facility] OR;  Service: Neurosurgery;  Laterality: Left;   ESOPHAGOGASTRODUODENOSCOPY  03/14/2002, 03/07/2013   ESOPHAGOGASTRODUODENOSCOPY (EGD) WITH PROPOFOL  N/A 06/21/2018   Procedure: ESOPHAGOGASTRODUODENOSCOPY (EGD) WITH PROPOFOL ;  Surgeon: Viktoria Lamar DASEN, MD;  Location: [facility] ENDOSCOPY;  Service: Endoscopy;  Laterality: N/A;   ESOPHAGOGASTRODUODENOSCOPY (EGD) WITH PROPOFOL  N/A 07/15/2019   Procedure: ESOPHAGOGASTRODUODENOSCOPY (EGD) WITH PROPOFOL ;  Surgeon: Therisa Bi, MD;  Location: [facility] ENDOSCOPY;  Service: Gastroenterology;  Laterality: N/A;   FOOT ARTHRODESIS Right 06/27/2022   Procedure: ARTHRODESIS FOOT;  Surgeon: Silva Juliene SAUNDERS, DPM;  Location: [facility] ORS;  Service: Podiatry;  Laterality: Right;   Post Septoplasty and turbinate reduction  2007   TRIGGER FINGER RELEASE      Social History   Socioeconomic History   Marital status: Married    Spouse name: Ritaj Dullea   Number of children: 2   Years of education: Not on file   Highest education level: Not on file  Occupational History   Occupation: Hairstylist  Tobacco Use   Smoking status: Never   Smokeless tobacco: Never  Vaping Use   Vaping status: Never Used  Substance and Sexual Activity   Alcohol use: Not Currently    Comment: wine cooler 2 drinks 2x a week/ OCCASIONALLY   Drug use: No   Sexual activity: Yes  Other Topics Concern   Not on file  Social History Narrative   Hairdresser    Social Drivers of Health   Financial Resource Strain: Low Risk  (09/04/2023)   Received from [facility]   Overall Financial Resource Strain (CARDIA)    Difficulty of Paying Living Expenses: Not hard at all  Food Insecurity: No Food Insecurity (02/08/2024)   Hunger Vital Sign    Worried About Running Out of Food in the Last Year: Never true    Ran Out of Food  in the Last Year: Never true  Transportation Needs: No Transportation Needs (02/08/2024)   PRAPARE - Administrator, Civil Service (Medical): No    Lack of Transportation (Non-Medical): No  Physical Activity: Insufficiently Active (11/01/2020)   Exercise Vital Sign    Days of Exercise per Week: 2 days    Minutes of Exercise per Session: 20 min  Stress: Not on file  Social Connections: Not on file  Intimate Partner Violence: Not At Risk (02/08/2024)   Humiliation, Afraid, Rape, and Kick questionnaire    Fear of Current or Ex-Partner: No    Emotionally Abused: No    Physically Abused: No    Sexually Abused: No    Family History  Problem Relation Age of Onset   Cancer Mother        Breast Cancer   Breast cancer Mother 59   Cancer Father        Colon Cancer   Heart disease Father        Hx of MI   Hypertension Father    Diabetes Father    Hyperlipidemia Father    Healthy Brother    Healthy  Brother    Cancer Maternal Grandmother        lung cancer   Heart disease Paternal Grandmother    Stroke Paternal Grandmother    Heart disease Paternal Grandfather    Cancer Maternal Uncle        esophageal   Thyroid  cancer Daughter 45       Beat Thyroid  Cancer Feb/Mar 2025     Current Outpatient Medications:    albuterol  (VENTOLIN  HFA) 108 (90 Base) MCG/ACT inhaler, 2 puffs Inhalation q 4-6 hours prn cough/wheeze; Duration: 90 days, Disp: , Rfl:    Azelastine -Fluticasone 137-50 MCG/ACT SUSP, Place 1 spray into both nostrils as needed., Disp: , Rfl:    budesonide -formoterol  (SYMBICORT ) 80-4.5 MCG/ACT inhaler, 1-2 puffs twice daily (rinse mouth after use), Disp: 1 each, Rfl: 5   busPIRone  (BUSPAR ) 7.5 MG tablet, Take 1 tablet (7.5 mg total) by mouth 2 (two) times daily., Disp: 60 tablet, Rfl: 2   calcium carbonate (OSCAL) 1500 (600 Ca) MG TABS tablet, Take 1 tablet by mouth daily., Disp: , Rfl:    citalopram  (CELEXA ) 40 MG tablet, TAKE 1 TABLET(40 MG) BY MOUTH DAILY, Disp: 30  tablet, Rfl: 5   cyanocobalamin  (VITAMIN B12) 1000 MCG tablet, Take 1,000 mcg by mouth daily., Disp: , Rfl:    desloratadine (CLARINEX) 5 MG tablet, Take 5 mg by mouth daily., Disp: , Rfl:    folic acid  (FOLVITE ) 1 MG tablet, Take 1 mg by mouth daily., Disp: , Rfl:    hydroxychloroquine (PLAQUENIL) 200 MG tablet, Take 200 mg by mouth daily., Disp: , Rfl:    losartan  (COZAAR ) 100 MG tablet, TAKE 1 TABLET BY MOUTH DAILY, Disp: 90 tablet, Rfl: 0   methotrexate  (RHEUMATREX) 2.5 MG tablet, Take 22.5 mg by mouth every Monday. Caution:Chemotherapy. Protect from light. Take 9 tabs po weekly with meals, Disp: , Rfl:    pantoprazole  (PROTONIX ) 40 MG tablet, Take 1 tablet (40 mg total) by mouth daily., Disp: 30 tablet, Rfl: 2   RINVOQ 15 MG TB24, Take 1 tablet by mouth daily., Disp: , Rfl:    Semaglutide -Weight Management (WEGOVY ) 1 MG/0.5ML SOAJ, Inject 1 mg into the skin once a week., Disp: 2 mL, Rfl: 4   Tocilizumab (ACTEMRA) 162 MG/0.9ML SOSY, 0.9 ml Subcutaneous; Duration: 30 day(s), Disp: , Rfl:    valACYclovir  (VALTREX ) 500 MG tablet, TAKE 1 TABLET BY MOUTH TWICE DAILY FOR 3 DAYS. CONTINUE 1 BY MOUTH EVERY DAY, Disp: 30 tablet, Rfl: 2   amLODipine  (NORVASC ) 5 MG tablet, Oral; Duration: 30 (Patient not taking: Reported on 03/03/2024), Disp: , Rfl:    dexlansoprazole  (DEXILANT ) 60 MG capsule, 1 capsule. (Patient not taking: Reported on 03/03/2024), Disp: , Rfl:    levocetirizine (XYZAL  ALLERGY 24HR) 5 MG tablet, 0.5 tablet in the evening Orally Once a day; Duration: 30 day(s) (Patient not taking: Reported on 03/03/2024), Disp: , Rfl:    montelukast  (SINGULAIR ) 10 MG tablet, 1 tablet Oral once a day; Duration: 30 (Patient not taking: Reported on 03/03/2024), Disp: , Rfl:   Current Facility-Administered Medications:    betamethasone  acetate-betamethasone  sodium phosphate  (CELESTONE ) injection 12 mg, 12 mg, Other, Once, Eldonna Novel, MD  MR BRAIN W WO CONTRAST Result Date: 03/04/2024 CLINICAL DATA:   Meningioma. EXAM: MRI HEAD WITHOUT AND WITH CONTRAST TECHNIQUE: Multiplanar, multiecho pulse sequences of the brain and surrounding structures were obtained without and with intravenous contrast. CONTRAST:  7 mL of Vueway  administered intravenously COMPARISON:  MRI of the head dated March 07, 2022. FINDINGS: Brain: There  is stable encephalomalacia changes present anteriorly within the left temporal lobe. There is no evidence of residual or recurrent meningioma within the left middle cranial fossa. A 7 mm nonenhancing nodules in again demonstrated along the inferior margin of the fourth ventricle and is unchanged. There is mild-to-moderate diffuse cerebral white matter disease. Vascular: Normal flow voids. Skull and upper cervical spine: Status post left pterional craniotomy. Sinuses/Orbits: Unremarkable. Other: None. IMPRESSION: 1. No evidence of residual or recurrent meningioma. 2. Stable encephalomalacia changes within the left anterior temporal lobe. 3. 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 Electronically Signed   By: Evalene Coho M.D.   On: 03/04/2024 09:38    No images are attached to the encounter.      Latest Ref Rng & Units 11/09/2023    8:31 AM  CMP  Glucose 70 - 99 mg/dL 85   BUN 6 - 23 mg/dL 12   Creatinine 9.59 - 1.20 mg/dL 9.11   Sodium 864 - 854 mEq/L 136   Potassium 3.5 - 5.1 mEq/L 4.5   Chloride 96 - 112 mEq/L 102   CO2 19 - 32 mEq/L 29   Calcium 8.4 - 10.5 mg/dL 9.4   Total Protein 6.0 - 8.3 g/dL 7.3   Total Bilirubin 0.2 - 1.2 mg/dL 0.5   Alkaline Phos 39 - 117 U/L 89   AST 0 - 37 U/L 24   ALT 0 - 35 U/L 19       Latest Ref Rng & Units 02/08/2024    3:09 PM  CBC  WBC 4.0 - 10.5 K/uL 4.2   Hemoglobin 12.0 - 15.0 g/dL 89.0   Hematocrit 63.9 - 46.0 % 32.4   Platelets 150 - 400 K/uL 294      Observation/objective: Appears in no acute distress over video visit today.  Breathing is  nonlabored  Assessment and plan: Patient is a 53 year old female referred for normocytic anemia likely secondary to chronic disease  Patient's baseline hemoglobin runs between 10.5-11.5 and it has been this way at least for the last 2 years.  White count and platelets are normal.  Iron studies B12 folate TSH normal.  Myeloma panel shows no M protein and serum free light chain ratio is normal.  Patient does not have any known chronic kidney disease.  As such the etiology of her anemia is not afferent from her peripheral blood work.  Given the stability of her hemoglobin over the last 2 years I am inclined to monitor this conservatively without the need for a bone marrow biopsy.  CBC with differential in 4 months in 8 months and I will see her back in 8 months.  Ferritin and iron studies to be checked in 4 months  Follow-up instructions:  I discussed the assessment and treatment plan with the patient. The patient was provided an opportunity to ask questions and all were answered. The patient agreed with the plan and demonstrated an understanding of the instructions.   The patient was advised to call back or seek an in-person evaluation if the symptoms worsen or if the condition fails to improve as anticipated.  I provided 12 minutes of face-to-face video visit time during this encounter, and > 50% was spent counseling as documented under my assessment & plan.  Visit Diagnosis: 1. Normocytic anemia     Dr. Annah Skene, MD, MPH [facility] Tel- [phone] 03/06/2024 8:19 AM

## 2024-03-14 ENCOUNTER — Ambulatory Visit: Admitting: Podiatry

## 2024-03-16 ENCOUNTER — Telehealth: Payer: Self-pay | Admitting: Internal Medicine

## 2024-03-16 DIAGNOSIS — R739 Hyperglycemia, unspecified: Secondary | ICD-10-CM

## 2024-03-16 DIAGNOSIS — E7800 Pure hypercholesterolemia, unspecified: Secondary | ICD-10-CM

## 2024-03-16 DIAGNOSIS — I10 Essential (primary) hypertension: Secondary | ICD-10-CM

## 2024-03-16 DIAGNOSIS — E039 Hypothyroidism, unspecified: Secondary | ICD-10-CM

## 2024-03-16 DIAGNOSIS — M059 Rheumatoid arthritis with rheumatoid factor, unspecified: Secondary | ICD-10-CM

## 2024-03-16 NOTE — Telephone Encounter (Signed)
 Orders placed for labs

## 2024-03-16 NOTE — Telephone Encounter (Signed)
 Lab order needed

## 2024-03-22 ENCOUNTER — Other Ambulatory Visit

## 2024-03-25 ENCOUNTER — Other Ambulatory Visit

## 2024-03-28 ENCOUNTER — Encounter: Admitting: Internal Medicine

## 2024-04-21 ENCOUNTER — Other Ambulatory Visit: Payer: Self-pay | Admitting: Internal Medicine

## 2024-05-05 ENCOUNTER — Encounter: Payer: Self-pay | Admitting: Internal Medicine

## 2024-05-06 ENCOUNTER — Other Ambulatory Visit: Payer: Self-pay | Admitting: Internal Medicine

## 2024-05-06 DIAGNOSIS — E042 Nontoxic multinodular goiter: Secondary | ICD-10-CM

## 2024-05-09 ENCOUNTER — Encounter: Payer: Self-pay | Admitting: Internal Medicine

## 2024-05-09 ENCOUNTER — Ambulatory Visit: Admitting: Internal Medicine

## 2024-05-09 ENCOUNTER — Ambulatory Visit: Admitting: Sleep Medicine

## 2024-05-09 VITALS — BP 130/76 | HR 78 | Temp 97.9°F | Ht 61.0 in | Wt 137.8 lb

## 2024-05-09 DIAGNOSIS — D649 Anemia, unspecified: Secondary | ICD-10-CM

## 2024-05-09 DIAGNOSIS — Z1231 Encounter for screening mammogram for malignant neoplasm of breast: Secondary | ICD-10-CM

## 2024-05-09 DIAGNOSIS — D329 Benign neoplasm of meninges, unspecified: Secondary | ICD-10-CM

## 2024-05-09 DIAGNOSIS — K219 Gastro-esophageal reflux disease without esophagitis: Secondary | ICD-10-CM

## 2024-05-09 DIAGNOSIS — G4730 Sleep apnea, unspecified: Secondary | ICD-10-CM

## 2024-05-09 DIAGNOSIS — Z0000 Encounter for general adult medical examination without abnormal findings: Secondary | ICD-10-CM

## 2024-05-09 DIAGNOSIS — E041 Nontoxic single thyroid nodule: Secondary | ICD-10-CM | POA: Diagnosis not present

## 2024-05-09 DIAGNOSIS — E039 Hypothyroidism, unspecified: Secondary | ICD-10-CM

## 2024-05-09 DIAGNOSIS — M059 Rheumatoid arthritis with rheumatoid factor, unspecified: Secondary | ICD-10-CM

## 2024-05-09 DIAGNOSIS — R739 Hyperglycemia, unspecified: Secondary | ICD-10-CM

## 2024-05-09 DIAGNOSIS — I10 Essential (primary) hypertension: Secondary | ICD-10-CM

## 2024-05-09 DIAGNOSIS — E7800 Pure hypercholesterolemia, unspecified: Secondary | ICD-10-CM | POA: Diagnosis not present

## 2024-05-09 DIAGNOSIS — F419 Anxiety disorder, unspecified: Secondary | ICD-10-CM

## 2024-05-09 MED ORDER — LOSARTAN POTASSIUM 100 MG PO TABS: 100.0000 mg | ORAL_TABLET | Freq: Every day | ORAL | 1 refills | Status: AC

## 2024-05-09 MED ORDER — PANTOPRAZOLE SODIUM 40 MG PO TBEC: 40.0000 mg | DELAYED_RELEASE_TABLET | Freq: Every day | ORAL | 1 refills | Status: AC

## 2024-05-09 NOTE — Progress Notes (Signed)
 Subjective:    Patient ID: Tina Parker, female    DOB: 1970-12-24, 53 y.o.   MRN: [id number]  Patient here for  Chief Complaint  Patient presents with   Annual Exam    Indigestion from time to time and allergies    HPI Here for a physical exam. Saw rheumatology 05/05/24 - f/u RA. Currently on rinvoq and methotrexate  and plaquenil. Added sulfasalazine. Saw hematology 03/03/24 - evaluation - anemia. Baseline hgb 10.5 - 11.5. w/up unrevealing. Recommended to follow. Had f/u with pulmonary - OSA. Using cpap. Continues on citalopram  and buspar . Had f/u with ortho 10/12/23 - MRI C-spine C4-5: Severe right lateral foraminal stenosis, C5-6: Severe right-sided foraminal stenosis, C6-7: Severe right-sided greater than  left-sided foraminal stenosis. S/p cervical interlaminar epidural steroid injection 11/04/23.  Does feel has helped some. Has not been exercising. Plans to start back going to the gym.  She is followed by endocrinology for her thyroid . Recent biopsy 07/2023 - benign. Plans to continue yearly follow up. Follow up thyroid  ultrasound scheduled for 05/16/24. Seeing GI - colonoscopy/EGD - need results. Did have episode increased indigestion recently. Reflux worse. Took TUMS. Discussed protonix /pepcid . Breathing overall stable.    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   Pure hypercholesterolemia    Rheumatoid arthritis(714.0)    positive anti CCP antibodies, positive RF, oligo-articular, MTX   Sciatica    Sleep apnea    Past Surgical History:  Procedure Laterality Date   ARTHRODESIS METATARSALPHALANGEAL JOINT (MTPJ)  Right 06/27/2022   Procedure: ARTHRODESIS METATARSALPHALANGEAL JOINT (MTPJ);  Surgeon: Tina Parker, DPM;  Location: [facility] ORS;  Service: Podiatry;  Laterality: Right;   CARPAL TUNNEL RELEASE Bilateral    CESAREAN SECTION  1996   COLONOSCOPY  03/14/2002, 08/16/2007, 03/07/2013   [facility]-father   COLONOSCOPY WITH PROPOFOL  N/A 06/21/2018   Procedure: COLONOSCOPY WITH PROPOFOL ;  Surgeon: Tina Lamar DASEN, MD;  Location: [facility] ENDOSCOPY;  Service: Endoscopy;  Laterality: N/A;   CRANIOTOMY Left 06/25/2017   Procedure: CRANIOTOMY TEMPORAL LEFT FOR TUMOR RESECTION;  Surgeon: Tina Duncans, MD;  Location: [facility] OR;  Service: Neurosurgery;  Laterality: Left;   ESOPHAGOGASTRODUODENOSCOPY  03/14/2002, 03/07/2013   ESOPHAGOGASTRODUODENOSCOPY (EGD) WITH PROPOFOL  N/A 06/21/2018   Procedure: ESOPHAGOGASTRODUODENOSCOPY (EGD) WITH PROPOFOL ;  Surgeon: Tina Lamar DASEN, MD;  Location: [facility] ENDOSCOPY;  Service: Endoscopy;  Laterality: N/A;   ESOPHAGOGASTRODUODENOSCOPY (EGD) WITH PROPOFOL  N/A 07/15/2019   Procedure: ESOPHAGOGASTRODUODENOSCOPY (EGD) WITH PROPOFOL ;  Surgeon: Tina Bi, MD;  Location: [facility] ENDOSCOPY;  Service: Gastroenterology;  Laterality: N/A;   FOOT ARTHRODESIS Right 06/27/2022   Procedure: ARTHRODESIS FOOT;  Surgeon: Tina Parker, DPM;  Location: [facility] ORS;  Service: Podiatry;  Laterality: Right;   Post Septoplasty and turbinate reduction  2007   TRIGGER FINGER RELEASE     Family History  Problem Relation Age of Onset   Cancer Mother        Breast Cancer   Breast cancer Mother 47   Cancer Father        Colon Cancer  Heart disease Father        Hx of MI   Hypertension Father    Diabetes Father    Hyperlipidemia Father    Healthy Brother    Healthy Brother    Cancer Maternal Grandmother        lung cancer   Heart disease Paternal Grandmother    Stroke Paternal Grandmother    Heart disease Paternal Grandfather    Cancer Maternal Uncle        esophageal   Thyroid  cancer Daughter 25        Beat Thyroid  Cancer Feb/Mar 2025   Social History   Socioeconomic History   Marital status: Married    Spouse name: Tina Parker   Number of children: 2   Years of education: Not on file   Highest education level: Not on file  Occupational History   Occupation: Hairstylist  Tobacco Use   Smoking status: Never   Smokeless tobacco: Never  Vaping Use   Vaping status: Never Used  Substance and Sexual Activity   Alcohol use: Not Currently    Comment: wine cooler 2 drinks 2x a week/ OCCASIONALLY   Drug use: No   Sexual activity: Yes  Other Topics Concern   Not on file  Social History Narrative   Hairdresser    Social Drivers of Health   Financial Resource Strain: Low Risk  (09/04/2023)   [facility]   Overall Financial Resource Strain (CARDIA)    Difficulty of Paying Living Expenses: Not hard at all  Food Insecurity: No Food Insecurity (02/08/2024)   Hunger Vital Sign    Worried About Running Out of Food in the Last Year: Never true    Ran Out of Food in the Last Year: Never true  Transportation Needs: No Transportation Needs (02/08/2024)   PRAPARE - Administrator, Civil Service (Medical): No    Lack of Transportation (Non-Medical): No  Physical Activity: Insufficiently Active (11/01/2020)   Exercise Vital Sign    Days of Exercise per Week: 2 days    Minutes of Exercise per Session: 20 min  Stress: Not on file  Social Connections: Not on file     Review of Systems  Constitutional:  Negative for appetite change and unexpected weight change.  HENT:  Negative for congestion, sinus pressure and sore throat.   Eyes:  Negative for pain and visual disturbance.  Respiratory:  Negative for cough and chest tightness.        Breathing stable.   Cardiovascular:  Negative for chest pain and palpitations.  Gastrointestinal:  Negative for abdominal pain, diarrhea, nausea and vomiting.       Acid reflux as outlined.   Genitourinary:   Negative for difficulty urinating and dysuria.  Musculoskeletal:  Negative for joint swelling and myalgias.  Skin:  Negative for color change and rash.  Neurological:  Negative for dizziness and headaches.  Hematological:  Negative for adenopathy. Does not bruise/bleed easily.  Psychiatric/Behavioral:  Negative for agitation and dysphoric mood.        Objective:     BP 130/76   Pulse 78   Temp 97.9 F (36.6 C) (Oral)   Ht 5' 1 (1.549 m)   Wt 137 lb 12.8 oz (62.5 kg)   LMP  (LMP Unknown)   SpO2 99%   BMI 26.04 kg/m  Wt Readings from Last 3 Encounters:  05/09/24 137 lb 12.8 oz (62.5 kg)  02/09/24 143 lb (64.9 kg)  02/08/24  141 lb 1.6 oz (64 kg)    Physical Exam Vitals reviewed.  Constitutional:      General: She is not in acute distress.    Appearance: Normal appearance. She is well-developed.  HENT:     Head: Normocephalic and atraumatic.     Right Ear: External ear normal.     Left Ear: External ear normal.     Mouth/Throat:     Pharynx: No oropharyngeal exudate or posterior oropharyngeal erythema.  Eyes:     General: No scleral icterus.       Right eye: No discharge.        Left eye: No discharge.     Conjunctiva/sclera: Conjunctivae normal.  Neck:     Thyroid : No thyromegaly.  Cardiovascular:     Rate and Rhythm: Normal rate and regular rhythm.  Pulmonary:     Effort: No tachypnea, accessory muscle usage or respiratory distress.     Breath sounds: Normal breath sounds. No decreased breath sounds or wheezing.  Chest:  Breasts:    Right: No inverted nipple, mass, nipple discharge or tenderness (no axillary adenopathy).     Left: No inverted nipple, mass, nipple discharge or tenderness (no axilarry adenopathy).  Abdominal:     General: Bowel sounds are normal.     Palpations: Abdomen is soft.     Tenderness: There is no abdominal tenderness.  Musculoskeletal:        General: No swelling or tenderness.     Cervical back: Neck supple.  Lymphadenopathy:      Cervical: No cervical adenopathy.  Skin:    Findings: No erythema or rash.  Neurological:     Mental Status: She is alert and oriented to person, place, and time.  Psychiatric:        Mood and Affect: Mood normal.        Behavior: Behavior normal.         Outpatient Encounter Medications as of 05/09/2024  Medication Sig   albuterol  (VENTOLIN  HFA) 108 (90 Base) MCG/ACT inhaler 2 puffs Inhalation q 4-6 hours prn cough/wheeze; Duration: 90 days   busPIRone  (BUSPAR ) 7.5 MG tablet Take 1 tablet (7.5 mg total) by mouth 2 (two) times daily.   calcium carbonate (OSCAL) 1500 (600 Ca) MG TABS tablet Take 1 tablet by mouth daily.   citalopram  (CELEXA ) 40 MG tablet TAKE 1 TABLET(40 MG) BY MOUTH DAILY   cyanocobalamin  (VITAMIN B12) 1000 MCG tablet Take 1,000 mcg by mouth daily.   folic acid  (FOLVITE ) 1 MG tablet Take 1 mg by mouth daily.   hydroxychloroquine (PLAQUENIL) 200 MG tablet Take 200 mg by mouth daily.   methotrexate  (RHEUMATREX) 2.5 MG tablet Take 22.5 mg by mouth every Monday. Caution:Chemotherapy. Protect from light. Take 9 tabs po weekly with meals   RINVOQ 15 MG TB24 Take 1 tablet by mouth daily.   valACYclovir  (VALTREX ) 500 MG tablet TAKE 1 TABLET BY MOUTH TWICE DAILY FOR 3 DAYS. CONTINUE 1 BY MOUTH EVERY DAY   WEGOVY  1 MG/0.5ML SOAJ SQ injection ADMINISTER 1 MG UNDER THE SKIN 1 TIME A WEEK   amLODipine  (NORVASC ) 5 MG tablet Oral; Duration: 30 (Patient not taking: Reported on 05/09/2024)   levocetirizine (XYZAL  ALLERGY 24HR) 5 MG tablet 0.5 tablet in the evening Orally Once a day; Duration: 30 day(s) (Patient not taking: Reported on 05/09/2024)   losartan  (COZAAR ) 100 MG tablet Take 1 tablet (100 mg total) by mouth daily.   montelukast  (SINGULAIR ) 10 MG tablet 1 tablet Oral once a day;  Duration: 30 (Patient not taking: Reported on 05/09/2024)   pantoprazole  (PROTONIX ) 40 MG tablet Take 1 tablet (40 mg total) by mouth daily.   [DISCONTINUED] Azelastine -Fluticasone 137-50 MCG/ACT SUSP  Place 1 spray into both nostrils as needed. (Patient not taking: Reported on 05/09/2024)   [DISCONTINUED] budesonide -formoterol  (SYMBICORT ) 80-4.5 MCG/ACT inhaler 1-2 puffs twice daily (rinse mouth after use) (Patient not taking: Reported on 05/09/2024)   [DISCONTINUED] desloratadine (CLARINEX) 5 MG tablet Take 5 mg by mouth daily. (Patient not taking: Reported on 05/09/2024)   [DISCONTINUED] dexlansoprazole  (DEXILANT ) 60 MG capsule 1 capsule. (Patient not taking: Reported on 05/09/2024)   [DISCONTINUED] losartan  (COZAAR ) 100 MG tablet TAKE 1 TABLET BY MOUTH DAILY   [DISCONTINUED] pantoprazole  (PROTONIX ) 40 MG tablet Take 1 tablet (40 mg total) by mouth daily. (Patient not taking: Reported on 05/09/2024)   [DISCONTINUED] Tocilizumab (ACTEMRA) 162 MG/0.9ML SOSY 0.9 ml Subcutaneous; Duration: 30 day(s) (Patient not taking: Reported on 05/09/2024)   Facility-Administered Encounter Medications as of 05/09/2024  Medication   betamethasone  acetate-betamethasone  sodium phosphate  (CELESTONE ) injection 12 mg     Lab Results  Component Value Date   WBC 4.2 02/08/2024   HGB 10.9 (L) 02/08/2024   HCT 32.4 (L) 02/08/2024   PLT 294 02/08/2024   GLUCOSE 76 05/09/2024   CHOL 217 (H) 05/09/2024   TRIG 65.0 05/09/2024   HDL 64.50 05/09/2024   LDLCALC 140 (H) 05/09/2024   ALT 13 05/09/2024   AST 23 05/09/2024   NA 137 05/09/2024   K 4.3 05/09/2024   CL 102 05/09/2024   CREATININE 0.95 05/09/2024   BUN 15 05/09/2024   CO2 25 05/09/2024   TSH 2.17 05/09/2024   HGBA1C 5.3 05/09/2024    MR BRAIN W WO CONTRAST Result Date: 03/04/2024 CLINICAL DATA:  Meningioma. EXAM: MRI HEAD WITHOUT AND WITH CONTRAST TECHNIQUE: Multiplanar, multiecho pulse sequences of the brain and surrounding structures were obtained without and with intravenous contrast. CONTRAST:  7 mL of Vueway  administered intravenously COMPARISON:  MRI of the head dated March 07, 2022. FINDINGS: Brain: There is stable encephalomalacia changes  present anteriorly within the left temporal lobe. There is no evidence of residual or recurrent meningioma within the left middle cranial fossa. A 7 mm nonenhancing nodules in again demonstrated along the inferior margin of the fourth ventricle and is unchanged. There is mild-to-moderate diffuse cerebral white matter disease. Vascular: Normal flow voids. Skull and upper cervical spine: Status post left pterional craniotomy. Sinuses/Orbits: Unremarkable. Other: None. IMPRESSION: 1. No evidence of residual or recurrent meningioma. 2. Stable encephalomalacia changes within the left anterior temporal lobe. 3. 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 Electronically Signed   By: Evalene Coho M.D.   On: 03/04/2024 09:38       Assessment & Plan:  Routine general medical examination at a health care facility  Health care maintenance Assessment & Plan: Physical today 05/09/24.  Mammogram 09/28/23 - Birads I.  PAP 11/22/20- negative with negative HPV. Follow up pap 03/19/23 - negative with negative HPV. Colonoscopy 05/2018 - internal hemorrhoids. GI - EGD/colonoscopy - need results.    Thyroid  nodule Assessment & Plan:  She is followed by endocrinology for her thyroid . Recent biopsy 07/2023 - benign. Plans to continue yearly follow up. Follow up thyroid  ultrasound scheduled for 05/16/24.  Orders: -     TSH  Encounter for screening mammogram for malignant neoplasm of breast -     3D Screening Mammogram, Left and Right; Future  Hypercholesterolemia Assessment & Plan: The 10-year ASCVD risk score (Arnett DK, et al., 2019) is: 4.8%   Values used to calculate the score:     Age: 47 years     Clincally relevant sex: Female     Is Non-Hispanic African American: Yes     Diabetic: No     Tobacco smoker: No     Systolic Blood Pressure: 130 mmHg     Is BP treated: Yes     HDL Cholesterol: 64.5 mg/dL     Total Cholesterol: 217  mg/dL  Low cholesterol diet and exercise.  Follow lipid panel.  Check lipid panel today.   Orders: -     Lipid panel -     Hepatic function panel -     Basic metabolic panel with GFR  Hyperglycemia Assessment & Plan: Low carb diet and exercise. Follow met b and A1c.   Orders: -     Hemoglobin A1c  Rheumatoid arthritis with positive rheumatoid factor, involving unspecified site [facility]) Assessment & Plan:  Saw rheumatology 05/05/24 - f/u RA. Currently on rinvoq and methotrexate  and plaquenil. Added sulfasalazine.   Mild sleep apnea Assessment & Plan: Continue cpap.    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     Hypothyroidism, unspecified type Assessment & Plan: On thyroid  replacement. Follow tsh. Planning f/u thyroid  ultrasound 05/16/24.    Gastroesophageal reflux disease, unspecified whether esophagitis present Assessment & Plan: Saw GI 09/04/23. F/u chronic constipation and dysphagia. Recommended continue protonix . Recent acid reflux as outlined. Protonix /pepcid . Obtain EGD results. Notify if persistent flares..    Essential hypertension, benign Assessment & Plan: Continues losartan . Follow pressures. Follow metabolic panel.    Anxiety Assessment & Plan: Continues on citalopram . Now on buspar  7.5mg .  stable.    Anemia, unspecified type Assessment & Plan: GI w/up as outlined. Follow cbc.    Other orders -     Losartan  Potassium; Take 1 tablet (100 mg total) by mouth daily.  Dispense: 90 tablet; Refill: 1 -     Pantoprazole  Sodium; Take 1 tablet (40 mg total) by mouth daily.  Dispense: 90 tablet; Refill: 1     Allena Hamilton, MD

## 2024-05-09 NOTE — Assessment & Plan Note (Addendum)
 Physical today 05/09/24.  Mammogram 09/28/23 - Birads I.  PAP 11/22/20- negative with negative HPV. Follow up pap 03/19/23 - negative with negative HPV. Colonoscopy 05/2018 - internal hemorrhoids. GI - EGD/colonoscopy - need results.

## 2024-05-10 LAB — BASIC METABOLIC PANEL WITH GFR
BUN: 15 mg/dL (ref 6–23)
CO2: 25 meq/L (ref 19–32)
Calcium: 9.2 mg/dL (ref 8.4–10.5)
Chloride: 102 meq/L (ref 96–112)
Creatinine, Ser: 0.95 mg/dL (ref 0.40–1.20)
GFR: 67.63 mL/min (ref >60.00)
Glucose, Bld: 76 mg/dL (ref 70–99)
Potassium: 4.3 meq/L (ref 3.5–5.1)
Sodium: 137 meq/L (ref 135–145)

## 2024-05-10 LAB — TSH: TSH: 2.17 u[IU]/mL (ref 0.35–5.50)

## 2024-05-10 LAB — HEPATIC FUNCTION PANEL
ALT: 13 U/L (ref 0–35)
AST: 23 U/L (ref 0–37)
Albumin: 4 g/dL (ref 3.5–5.2)
Alkaline Phosphatase: 100 U/L (ref 39–117)
Bilirubin, Direct: 0.1 mg/dL (ref 0.0–0.3)
Total Bilirubin: 0.7 mg/dL (ref 0.2–1.2)
Total Protein: 7.2 g/dL (ref 6.0–8.3)

## 2024-05-10 LAB — LIPID PANEL
Cholesterol: 217 mg/dL — ABNORMAL HIGH (ref 0–200)
HDL: 64.5 mg/dL (ref >39.00)
LDL Cholesterol: 140 mg/dL — ABNORMAL HIGH (ref 0–99)
NonHDL: 152.5
Total CHOL/HDL Ratio: 3
Triglycerides: 65 mg/dL (ref 0.0–149.0)
VLDL: 13 mg/dL (ref 0.0–40.0)

## 2024-05-10 LAB — HEMOGLOBIN A1C: Hgb A1c MFr Bld: 5.3 % (ref 4.6–6.5)

## 2024-05-11 ENCOUNTER — Ambulatory Visit: Payer: Self-pay | Admitting: Internal Medicine

## 2024-05-15 ENCOUNTER — Encounter: Payer: Self-pay | Admitting: Internal Medicine

## 2024-05-15 ENCOUNTER — Telehealth: Payer: Self-pay | Admitting: Internal Medicine

## 2024-05-15 NOTE — Assessment & Plan Note (Signed)
Continues losartan. Follow pressures.  Follow metabolic panel.

## 2024-05-15 NOTE — Assessment & Plan Note (Signed)
 Has been followed by Dr Franky Macho - [facility].

## 2024-05-15 NOTE — Assessment & Plan Note (Signed)
 On thyroid  replacement. Follow tsh. Planning f/u thyroid  ultrasound 05/16/24.

## 2024-05-15 NOTE — Assessment & Plan Note (Signed)
 Continues on citalopram . Now on buspar  7.5mg .  stable.

## 2024-05-15 NOTE — Assessment & Plan Note (Signed)
 Saw GI 09/04/23. F/u chronic constipation and dysphagia. Recommended continue protonix . Recent acid reflux as outlined. Protonix /pepcid . Obtain EGD results. Notify if persistent flares.SABRA

## 2024-05-15 NOTE — Assessment & Plan Note (Signed)
 She is followed by endocrinology for her thyroid . Recent biopsy 07/2023 - benign. Plans to continue yearly follow up. Follow up thyroid  ultrasound scheduled for 05/16/24.

## 2024-05-15 NOTE — Assessment & Plan Note (Signed)
GI w/up as outlined.  Follow cbc.  

## 2024-05-15 NOTE — Assessment & Plan Note (Signed)
 The 10-year ASCVD risk score (Arnett DK, et al., 2019) is: 4.8%   Values used to calculate the score:     Age: 53 years     Clincally relevant sex: Female     Is Non-Hispanic African American: Yes     Diabetic: No     Tobacco smoker: No     Systolic Blood Pressure: 130 mmHg     Is BP treated: Yes     HDL Cholesterol: 64.5 mg/dL     Total Cholesterol: 217 mg/dL  Low cholesterol diet and exercise.  Follow lipid panel.  Check lipid panel today.

## 2024-05-15 NOTE — Assessment & Plan Note (Signed)
 Saw rheumatology 05/05/24 - f/u RA. Currently on rinvoq and methotrexate  and plaquenil. Added sulfasalazine.

## 2024-05-15 NOTE — Assessment & Plan Note (Signed)
 Low-carb diet and exercise.  Follow met b and A1c.

## 2024-05-15 NOTE — Assessment & Plan Note (Signed)
 Continue cpap.

## 2024-05-15 NOTE — Telephone Encounter (Signed)
 Need EGD and colonoscopy report from [facility] - recent colonoscopy and EGD. Please call and request copy.

## 2024-05-16 ENCOUNTER — Ambulatory Visit
Admission: RE | Admit: 2024-05-16 | Discharge: 2024-05-16 | Disposition: A | Discharge status: Home / Self Care | Source: Ambulatory Visit | Attending: Internal Medicine | Admitting: Internal Medicine

## 2024-05-16 DIAGNOSIS — E042 Nontoxic multinodular goiter: Secondary | ICD-10-CM

## 2024-05-16 NOTE — Telephone Encounter (Signed)
 Recorders requested on 05/16/24 at 10:30 am via epic at [phone]

## 2024-05-23 ENCOUNTER — Ambulatory Visit: Payer: Self-pay | Admitting: Internal Medicine

## 2024-05-30 ENCOUNTER — Ambulatory Visit: Payer: 59 | Admitting: Internal Medicine

## 2024-05-30 ENCOUNTER — Ambulatory Visit: Admitting: Sleep Medicine

## 2024-06-06 ENCOUNTER — Ambulatory Visit: Admitting: Sleep Medicine

## 2024-06-06 ENCOUNTER — Encounter: Payer: Self-pay | Admitting: Sleep Medicine

## 2024-06-06 VITALS — BP 130/80 | HR 97 | Temp 98.6°F | Ht 61.0 in | Wt 144.2 lb

## 2024-06-06 DIAGNOSIS — I10 Essential (primary) hypertension: Secondary | ICD-10-CM

## 2024-06-06 DIAGNOSIS — G4733 Obstructive sleep apnea (adult) (pediatric): Secondary | ICD-10-CM

## 2024-06-06 NOTE — Progress Notes (Signed)
 Name:Tina Parker MRN: [id number] DOB: 04-03-1971   CHIEF COMPLAINT:  CPAP F/U   HISTORY OF PRESENT ILLNESS:  Mr. Tina Parker is a 53 y.o. w/ a h/o OSA, HTN and RA who presents for CPAP follow up visit. Reports difficulty using CPAP therapy due to staying at her in laws house half of the week. Reports feeling more refreshed upon awakening when she is able to use CPAP the entire night.    PAST MEDICAL HISTORY :   has a past medical history of Allergic rhinitis, Anemia, Anxiety, Arthritis, Carpal tunnel syndrome, Dysphagia, Esophageal spasm (01/16/2014), GERD (gastroesophageal reflux disease), Graves disease, History of hiatal hernia, Hypertension, Migraine headache, PPD positive, Pure hypercholesterolemia, Rheumatoid arthritis(714.0), Sciatica, and Sleep apnea.  has a past surgical history that includes Cesarean section (1996); Post Septoplasty and turbinate reduction (2007); Trigger finger release; Carpal tunnel release (Bilateral); Craniotomy (Left, 06/25/2017); Colonoscopy (03/14/2002, 08/16/2007, 03/07/2013); Esophagogastroduodenoscopy (03/14/2002, 03/07/2013); Colonoscopy with propofol  (N/A, 06/21/2018); Esophagogastroduodenoscopy (egd) with propofol  (N/A, 06/21/2018); Esophagogastroduodenoscopy (egd) with propofol  (N/A, 07/15/2019); Arthrodesis metatarsalphalangeal joint (mtpj) (Right, 06/27/2022); and Foot arthrodesis (Right, 06/27/2022). Prior to Admission medications   Medication Sig Start Date End Date Taking? Authorizing Provider  albuterol  (VENTOLIN  HFA) 108 (90 Base) MCG/ACT inhaler 2 puffs Inhalation q 4-6 hours prn cough/wheeze; Duration: 90 days   Yes [provider]  Azelastine -Fluticasone 137-50 MCG/ACT SUSP Place 1 spray into both nostrils as needed. 07/10/21  Yes [provider]  budesonide -formoterol  (SYMBICORT ) 80-4.5 MCG/ACT inhaler 1-2 puffs twice daily (rinse mouth after use) 09/14/23  Yes Glendia Shad, MD  busPIRone  (BUSPAR ) 7.5 MG tablet Take 1  tablet (7.5 mg total) by mouth 2 (two) times daily. 09/14/23  Yes Glendia Shad, MD  calcium carbonate (OSCAL) 1500 (600 Ca) MG TABS tablet Take 1 tablet by mouth daily.   Yes [provider]  citalopram  (CELEXA ) 40 MG tablet TAKE 1 TABLET(40 MG) BY MOUTH DAILY 09/14/23  Yes Glendia Shad, MD  desloratadine (CLARINEX) 5 MG tablet Take 5 mg by mouth daily. 07/09/21  Yes [provider]  folic acid  (FOLVITE ) 1 MG tablet Take 1 mg by mouth daily. 03/10/19  Yes [provider]  hydroxychloroquine (PLAQUENIL) 200 MG tablet Take 200 mg by mouth daily. 02/08/24  Yes [provider]  levocetirizine (XYZAL  ALLERGY 24HR) 5 MG tablet 0.5 tablet in the evening Orally Once a day; Duration: 30 day(s)   Yes [provider]  losartan  (COZAAR ) 100 MG tablet TAKE 1 TABLET BY MOUTH DAILY 01/29/24  Yes Glendia Shad, MD  methotrexate  (RHEUMATREX) 2.5 MG tablet Take 22.5 mg by mouth every Monday. Caution:Chemotherapy. Protect from light. Take 9 tabs po weekly with meals   Yes [provider]  pantoprazole  (PROTONIX ) 40 MG tablet Take 1 tablet (40 mg total) by mouth daily. 07/20/23  Yes Glendia Shad, MD  RINVOQ 15 MG TB24 Take 1 tablet by mouth daily. 08/26/21  Yes [provider]  Semaglutide -Weight Management (WEGOVY ) 1 MG/0.5ML SOAJ Inject 1 mg into the skin once a week. 03/19/23  Yes Glendia Shad, MD  Tocilizumab (ACTEMRA) 162 MG/0.9ML SOSY 0.9 ml Subcutaneous; Duration: 30 day(s) 08/22/19  Yes [provider]  valACYclovir  (VALTREX ) 500 MG tablet TAKE 1 TABLET BY MOUTH TWICE DAILY FOR 3 DAYS. CONTINUE 1 BY MOUTH EVERY DAY 12/22/23  Yes Glendia Shad, MD  amLODipine  (NORVASC ) 5 MG tablet Oral; Duration: 30 Patient not taking: Reported on 02/09/2024    [provider]  dexlansoprazole  (DEXILANT ) 60 MG capsule 1  capsule. Patient not taking: Reported on 02/09/2024    [provider]  montelukast  (SINGULAIR ) 10 MG tablet 1 tablet  Oral once a day; Duration: 30 Patient not taking: Reported on 02/09/2024    [provider]   Allergies  Allergen Reactions   Cephalexin Shortness Of Breath   Inh [Isoniazid] Other (See Comments)    Causes hepatitis   Oxycodone  Shortness Of Breath    FAMILY HISTORY:  family history includes Breast cancer (age of onset: 96) in her mother; Cancer in her father, maternal grandmother, maternal uncle, and mother; Diabetes in her father; Healthy in her brother and brother; Heart disease in her father, paternal grandfather, and paternal grandmother; Hyperlipidemia in her father; Hypertension in her father; Stroke in her paternal grandmother; Thyroid  cancer (age of onset: 38) in her daughter. SOCIAL HISTORY:  reports that she has never smoked. She has never used smokeless tobacco. She reports that she does not currently use alcohol. She reports that she does not use drugs.   Review of Systems:  Gen:  Denies  fever, sweats, chills weight loss  HEENT: Denies blurred vision, double vision, ear pain, eye pain, hearing loss, nose bleeds, sore throat Cardiac:  No dizziness, chest pain or heaviness, chest tightness,edema, No JVD Resp:   No cough, -sputum production, -shortness of breath,-wheezing, -hemoptysis,  Gi: Denies swallowing difficulty, stomach pain, nausea or vomiting, diarrhea, constipation, bowel incontinence Gu:  Denies bladder incontinence, burning urine Ext:   Denies Joint pain, stiffness or swelling Skin: Denies  skin rash, easy bruising or bleeding or hives Endoc:  Denies polyuria, polydipsia , polyphagia or weight change Psych:   Denies depression, insomnia or hallucinations  Other:  All other systems negative  VITAL SIGNS: BP 130/80   Pulse 97   Temp 98.6 F (37 C)   Ht 5' 1 (1.549 m)   Wt 144 lb 3.2 oz (65.4 kg)   LMP  (LMP Unknown)   SpO2 99%   BMI 27.25 kg/m    Physical Examination:   General Appearance: No distress  EYES PERRLA, EOM intact.   NECK  Supple, No JVD Pulmonary: normal breath sounds, No wheezing.  CardiovascularNormal S1,S2.  No m/r/g.   Abdomen: Benign, Soft, non-tender. Skin:   warm, no rashes, no ecchymosis  Extremities: normal, no cyanosis, clubbing. Neuro:without focal findings,  speech normal  PSYCHIATRIC: Mood, affect within normal limits.   ASSESSMENT AND PLAN  OSA Patient is using and benefiting from CPAP therapy. Counseled patient on increasing CPAP compliance. Discussed the consequences of untreated sleep apnea. Advised not to drive drowsy for safety of patient and others. Will follow up in 3 months.    HTN Stable, on current management. Following with PCP.    Patient  satisfied with Plan of action and management. All questions answered  I spent a total of 21 minutes reviewing chart data, face-to-face evaluation with the patient, counseling and coordination of care as detailed above.    Charlita Brian, M.D.  Sleep Medicine [facility]

## 2024-06-06 NOTE — Patient Instructions (Addendum)

## 2024-06-13 ENCOUNTER — Encounter: Payer: Self-pay | Admitting: Internal Medicine

## 2024-06-13 ENCOUNTER — Ambulatory Visit: Admitting: Internal Medicine

## 2024-06-13 VITALS — BP 160/100 | HR 92 | Ht 61.0 in | Wt 146.0 lb

## 2024-06-13 DIAGNOSIS — E042 Nontoxic multinodular goiter: Secondary | ICD-10-CM

## 2024-06-13 DIAGNOSIS — Z8639 Personal history of other endocrine, nutritional and metabolic disease: Secondary | ICD-10-CM

## 2024-06-13 NOTE — Progress Notes (Signed)
 Patient ID: Tina Parker, female   DOB: April 23, 1971, 53 y.o.   MRN: [id number]   HPI  Tina Parker is a 53 y.o.-year-old female, initially referred by her PCP, Dr. Glendia, returning for follow-up for thyroid  nodules.  Last visit 1 year ago.  Interim history: She previously had neck pain, dysphagia, hoarseness.  However, the symptoms improved after losing almost 40 pounds on Wegovy  before last visit. She still has some chocking (hiatal hernia) and discomfort laying on the R side or turning the head to the left.  Reviewed and addended history: At 53 y/o, she had Graves ds. >> started on medication >> after she gave birth to her daughter in 48 >> developed hypothyroidism >> started LT4 >> continued for several years >> then came off.  Of note, she had a normal swallowing study in 2015, which only showed a small hiatal hernia.  Pt had a thyroid  nodule palpated by PCP in 2020 >> sent for a thyroid  U/S - this showed a L thyroid  nodule:  Thyroid  U/S (05/16/2019): Left inferior, isoechoic, 1.7 cm,  thyroid  nodule      FNA of this nodule (07/11/2018): Benign  After the Bx, she developed dysphagia >> had EGD >> Es stretched.  Barium swallow (04/16/2020): Small sliding hiatal hernia. No evidence of esophageal stricture or extrinsic compression. Gastroesophageal reflux to level of midthoracic esophagus.  Thyroid  U/S (04/18/2020): Parenchymal Echotexture: Normal Isthmus: 0.3 cm, previously 0.2 cm Right lobe: 3.5 x 1.0 x 1.3 cm, previously 3.5 x 0.9 x 1.2 cm Left lobe: 3.9 x 1.1 x 1.4 cm, previously 3.6 x 1.3 x 1.3 cm _________________________________________________________   Nodule # 1: Location: Right; Mid Maximum size: 0.6 cm; Other 2 dimensions: 0.5 x 0.4 cm Composition: solid/almost completely solid (2) Echogenicity: isoechoic (1) *Given size (<1.4 cm) and appearance, this nodule does NOT meet TI-RADS criteria for biopsy or dedicated follow-up.   _________________________________________________________   Nodule # 2: Prior biopsy: No Location: Left; Mid Maximum size: 1.6 cm; Other 2 dimensions: 1.1 x 0.9 cm, previously, 1.7 x 1.3 x 0.9 cm Composition: solid/almost completely solid (2) Echogenicity: isoechoic (1)  *Given size (>/= 1.5 - 2.4 cm) and appearance, a follow-up ultrasound in 1 year should be considered based on TI-RADS criteria. _________________________________________________________   IMPRESSION: Similar appearing solid left thyroid  nodule (labeled 2), again categorized as TI-RADS 3. Recommend annual ultrasound follow-up until 5 years of stability are established. This study marks 1 year stability.  Thyroid  U/S (06/04/2021): Parenchymal Echotexture: Moderately heterogenous  Isthmus: 0.4 cm  Right lobe: 3.4 cm x 1.0 cm x 1.1 cm  Left lobe: 4.2 cm x 1.2 cm x 1.4 cm  _____________________________________________________________________   Nodule labeled 1 in the right mid thyroid , 6 mm, unchanged, and does not meet criteria for further surveillance.   Nodule labeled 2 in the left mid thyroid , previously 1.6 cm, currently measuring 2.0 cm. Nodule remains TR 3 characteristics and meets criteria for further surveillance.     No adenopathy   Recommendations follow those established by the new ACR TI-RADS criteria (J Am Coll Radiol 2017;14:587-595).   IMPRESSION: Left mid thyroid  nodule meets criteria for continued surveillance, as designated by the newly established ACR TI-RADS criteria. Surveillance ultrasound study recommended to be performed annually up to 5 years.  Thyroid  U/S (06/02/2022): Parenchymal Echotexture: Mildly heterogeneous  Isthmus: 0.3 cm  Right lobe: 4.0 x 0.8 x 0.9 cm  Left lobe: 4.0 x 1.1 x 1.6 cm  _________________________________________________________   Estimated total number of  nodules >/= 1 cm: 1 _________________________________________________________   Nodule 1: 0.6 x  0.5 x 0.4 cm isoechoic solid right inferior thyroid  nodule is unchanged from prior examination and does not meet criteria for imaging surveillance or FNA.  _________________________________________________________   Nodule # 2:  Prior biopsy: No  Location: Left; mid  Maximum size: 2.1 cm; Other 2 dimensions: 1.2 x 1.2 cm, previously, 2.0 x 1.4 x 0.8 cm  Composition: solid/almost completely solid (2)  Echogenicity: isoechoic (1) *Given size (>/= 1.5 - 2.4 cm) and appearance, a follow-up ultrasound in 1 year should be considered based on TI-RADS criteria.  _________________________________________________________   IMPRESSION: Nodule 2 (TI-RADS 3), measuring 2.1 cm, located in the mid left thyroid  lobe is not significantly changed in size since prior examination from 04/16/2020 and still meets criteria for imaging follow-up. Annual ultrasound surveillance is recommended until 5 years of stability is documented.  Thyroid  U/S (05/04/2023): Parenchymal Echotexture: Mildly heterogeneous  Isthmus: 0.2 cm  Right lobe: 3.7 x 1.0 x 1.1 cm  Left lobe: 3.7 x 1.5 x 1.4 cm  _________________________________________________________   Estimated total number of nodules >/= 1 cm: 1 _________________________________________________________   Nodules 1 and 2, located in the inferior right thyroid  lobe, are subcentimeter and do not meet criteria for FNA or imaging follow-up.   Nodule 3: 2.3 x 1.1 x 1.6 cm solid isoechoic left mid thyroid  nodule (TI-RADS 3) is not significantly changed in size since prior examination from 06/02/2022 where it measured 2.1 x 1.2 x 1.2 cm. It is minimally larger when compared to the 05/16/2019 examination where it measured 1.7 x 1.3 x 0.9 cm. This nodule still meets criteria for imaging surveillance.   Incidental note made of calcified atheromatous plaque of the left mid common carotid artery.   IMPRESSION: Nodule 3 located in the mid left thyroid  lobe (TI-RADS  3) is not significantly changed in size since prior examination from 06/02/2022, but is minimally larger compared to older examination from 05/16/2019. This nodule still meets criteria for imaging surveillance. Follow-up ultrasound recommended in 1 year.   Thyroid  U/S (05/16/2024): Parenchymal Echotexture: Mildly heterogenous  Isthmus: 0.4 cm  Right lobe: 3.7 x 1.2 x 1.0 cm  Left lobe: 4.1 x 1.2 x 1.4 cm  _________________________________________________________   Estimated total number of nodules >/= 1 cm: 1  ________________________________________________________   Nodule # 3:  Location: LEFT; Mid  Maximum size: 2.4 cm; Other 2 dimensions: 1.6 x 1.0 cm, previously 2.3 x 1.6 x 1.1 cm  No aggressive features on today's evaluation. This nodule appears morphologically stable for least 4 years, and was previously biopsied in 07/21/2023.  Assuming a benign pathologic diagnosis, repeat sampling and/or dedicated follow-up is not recommended.  _________________________________________________________   Additional sub-1 cm solid and cystic nodules scattered within the gland do not meet threshold for follow-up nor biopsy per current criteria.   No cervical adenopathy or abnormal fluid collection within the imaged neck.   IMPRESSION: 1. Nonenlarged, mildly heterogeneous thyroid  gland. 2. 2.4 cm LEFT mid thyroid  nodule. This nodule has been morphologically stable for least 4 years, and was previously biopsied in 07/21/2023. Assuming a benign pathologic diagnosis, repeat sampling and/or dedicated follow-up is not recommended.  She does have GERD - better after losing weight. Prev. On Dexilant  >> had to come off 2/2 insurance coverage >> Omeprazole .  I reviewed pt's thyroid  tests: Lab Results  Component Value Date   TSH 2.17 05/09/2024   TSH 1.65 11/09/2023   TSH 3.42 12/15/2022   TSH  1.73 01/27/2022   TSH 3.10 06/25/2021   TSH 2.38 02/27/2021   TSH 1.59 04/09/2020   TSH 1.97  04/25/2019   TSH 2.91 10/11/2018   TSH 2.76 12/21/2017   FREET4 0.63 04/09/2020   FREET4 0.71 12/21/2017    No FH of thyroid  ds. + FH of thyroid  cancer in M first cousin and daughter -who was diagnosed with PTC in 2024. No h/o radiation tx to head or neck. No steroid use. No herbal supplements. No Biotin supplements or Hair, Skin and Nails vitamins.  Pt also has a history of leukopenia, meningioma - surgically removed, HTN, HL, RA - on Actemra and MTX - but not working well, vitamin D  deficiency. On B12.   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   Pure hypercholesterolemia    Rheumatoid arthritis(714.0)    positive anti CCP antibodies, positive RF, oligo-articular, MTX   Sciatica    Sleep apnea    Past Surgical History:  Procedure Laterality Date   ARTHRODESIS METATARSALPHALANGEAL JOINT (MTPJ) Right 06/27/2022   Procedure: ARTHRODESIS METATARSALPHALANGEAL JOINT (MTPJ);  Surgeon: Silva Juliene SAUNDERS, DPM;  Location: [facility] ORS;  Service: Podiatry;  Laterality: Right;   CARPAL TUNNEL RELEASE Bilateral    CESAREAN SECTION  1996   COLONOSCOPY  03/14/2002, 08/16/2007, 03/07/2013   [facility]-father   COLONOSCOPY WITH PROPOFOL  N/A 06/21/2018   Procedure: COLONOSCOPY WITH PROPOFOL ;  Surgeon: Viktoria Lamar DASEN, MD;  Location: [facility] ENDOSCOPY;  Service: Endoscopy;  Laterality: N/A;   CRANIOTOMY Left 06/25/2017   Procedure: CRANIOTOMY TEMPORAL LEFT FOR TUMOR RESECTION;  Surgeon: Gillie Duncans, MD;  Location: [facility] OR;  Service: Neurosurgery;  Laterality: Left;   ESOPHAGOGASTRODUODENOSCOPY  03/14/2002, 03/07/2013    ESOPHAGOGASTRODUODENOSCOPY (EGD) WITH PROPOFOL  N/A 06/21/2018   Procedure: ESOPHAGOGASTRODUODENOSCOPY (EGD) WITH PROPOFOL ;  Surgeon: Viktoria Lamar DASEN, MD;  Location: [facility] ENDOSCOPY;  Service: Endoscopy;  Laterality: N/A;   ESOPHAGOGASTRODUODENOSCOPY (EGD) WITH PROPOFOL  N/A 07/15/2019   Procedure: ESOPHAGOGASTRODUODENOSCOPY (EGD) WITH PROPOFOL ;  Surgeon: Therisa Bi, MD;  Location: [facility] ENDOSCOPY;  Service: Gastroenterology;  Laterality: N/A;   FOOT ARTHRODESIS Right 06/27/2022   Procedure: ARTHRODESIS FOOT;  Surgeon: Silva Juliene SAUNDERS, DPM;  Location: [facility] ORS;  Service: Podiatry;  Laterality: Right;   Post Septoplasty and turbinate reduction  2007   TRIGGER FINGER RELEASE     Social History   Socioeconomic History   Marital status: Married    Spouse name: Evlyn Amason   Number of children: 2   Years of education: Not on file   Highest education level: Not on file  Occupational History   Occupation: Hairstylist  Tobacco Use   Smoking status: Never   Smokeless tobacco: Never  Vaping Use   Vaping status: Never Used  Substance and Sexual Activity   Alcohol use: Not Currently    Comment: wine cooler 2 drinks 2x a week/ OCCASIONALLY   Drug use: No   Sexual activity: Yes  Other Topics Concern   Not on file  Social History Narrative  Hairdresser    Social Drivers of Health   Tobacco Use: Low Risk (06/06/2024)   Patient History    Smoking Tobacco Use: Never    Smokeless Tobacco Use: Never    Passive Exposure: Not on file  Financial Resource Strain: Low Risk  (09/04/2023)   Received from [facility]   Overall Financial Resource Strain (CARDIA)    Difficulty of Paying Living Expenses: Not hard at all  Food Insecurity: No Food Insecurity (02/08/2024)   Epic    Worried About Programme Researcher, Broadcasting/film/video in the Last Year: Never true    Ran Out of Food in the Last Year: Never true  Transportation Needs: No Transportation Needs (02/08/2024)   Epic    Lack of Transportation  (Medical): No    Lack of Transportation (Non-Medical): No  Physical Activity: Not on file  Stress: Not on file  Social Connections: Not on file  Intimate Partner Violence: Not At Risk (02/08/2024)   Epic    Fear of Current or Ex-Partner: No    Emotionally Abused: No    Physically Abused: No    Sexually Abused: No  Depression (PHQ2-9): Low Risk (05/09/2024)   Depression (PHQ2-9)    PHQ-2 Score: 0  Alcohol Screen: Not on file  Housing: Low Risk  (05/05/2024)   Received from [facility]   Epic    In the last 12 months, was there a time when you were not able to pay the mortgage or rent on time?: No    In the past 12 months, how many times have you moved where you were living?: 0    At any time in the past 12 months, were you homeless or living in a shelter (including now)?: No  Utilities: Not At Risk (02/08/2024)   Epic    Threatened with loss of utilities: No  Health Literacy: Not on file   Current Outpatient Medications on File Prior to Visit  Medication Sig Dispense Refill   albuterol  (VENTOLIN  HFA) 108 (90 Base) MCG/ACT inhaler 2 puffs Inhalation q 4-6 hours prn cough/wheeze; Duration: 90 days     amLODipine  (NORVASC ) 5 MG tablet Oral; Duration: 30 (Patient not taking: Reported on 06/06/2024)     busPIRone  (BUSPAR ) 7.5 MG tablet Take 1 tablet (7.5 mg total) by mouth 2 (two) times daily. 60 tablet 2   calcium carbonate (OSCAL) 1500 (600 Ca) MG TABS tablet Take 1 tablet by mouth daily.     citalopram  (CELEXA ) 40 MG tablet TAKE 1 TABLET(40 MG) BY MOUTH DAILY 30 tablet 5   cyanocobalamin  (VITAMIN B12) 1000 MCG tablet Take 1,000 mcg by mouth daily.     folic acid  (FOLVITE ) 1 MG tablet Take 1 mg by mouth daily.     hydroxychloroquine (PLAQUENIL) 200 MG tablet Take 200 mg by mouth daily.     levocetirizine (XYZAL  ALLERGY 24HR) 5 MG tablet 0.5 tablet in the evening Orally Once a day; Duration: 30 day(s) (Patient not taking: Reported on 06/06/2024)     losartan  (COZAAR ) 100  MG tablet Take 1 tablet (100 mg total) by mouth daily. 90 tablet 1   methotrexate  (RHEUMATREX) 2.5 MG tablet Take 22.5 mg by mouth every Monday. Caution:Chemotherapy. Protect from light. Take 9 tabs po weekly with meals     montelukast  (SINGULAIR ) 10 MG tablet 1 tablet Oral once a day; Duration: 30 (Patient not taking: Reported on 06/06/2024)     pantoprazole  (PROTONIX ) 40 MG tablet Take 1 tablet (40 mg total) by  mouth daily. 90 tablet 1   RINVOQ 15 MG TB24 Take 1 tablet by mouth daily.     sulfaSALAzine (AZULFIDINE) 500 MG EC tablet Take 500 mg by mouth daily.     valACYclovir  (VALTREX ) 500 MG tablet TAKE 1 TABLET BY MOUTH TWICE DAILY FOR 3 DAYS. CONTINUE 1 BY MOUTH EVERY DAY 30 tablet 2   WEGOVY  1 MG/0.5ML SOAJ SQ injection ADMINISTER 1 MG UNDER THE SKIN 1 TIME A WEEK 2 mL 4   Current Facility-Administered Medications on File Prior to Visit  Medication Dose Route Frequency Provider Last Rate Last Admin   betamethasone  acetate-betamethasone  sodium phosphate  (CELESTONE ) injection 12 mg  12 mg Other Once Eldonna Novel, MD       Allergies  Allergen Reactions   Cephalexin Shortness Of Breath   Inh [Isoniazid] Other (See Comments)    Causes hepatitis   Oxycodone  Shortness Of Breath   Family History  Problem Relation Age of Onset   Cancer Mother        Breast Cancer   Breast cancer Mother 80   Cancer Father        Colon Cancer   Heart disease Father        Hx of MI   Hypertension Father    Diabetes Father    Hyperlipidemia Father    Healthy Brother    Healthy Brother    Cancer Maternal Grandmother        lung cancer   Heart disease Paternal Grandmother    Stroke Paternal Grandmother    Heart disease Paternal Grandfather    Cancer Maternal Uncle        esophageal   Thyroid  cancer Daughter 11       Beat Thyroid  Cancer Feb/Mar 2025   PE: BP (!) 160/100 Comment: patient denies symptoms, will follow up with pcp  Pulse 92   Ht 5' 1 (1.549 m)   Wt 146 lb (66.2 kg)   LMP  (LMP  Unknown)   SpO2 97%   BMI 27.59 kg/m  Wt Readings from Last 3 Encounters:  06/13/24 146 lb (66.2 kg)  06/06/24 144 lb 3.2 oz (65.4 kg)  05/09/24 137 lb 12.8 oz (62.5 kg)   Constitutional: normal weight, in NAD Eyes:  EOMI, no exophthalmos ENT: no neck masses, no cervical lymphadenopathy Cardiovascular: tachycardia, RR, No MRG Respiratory: CTA B Musculoskeletal: no deformities Skin:no rashes Neurological: L tremor with outstretched hand  ASSESSMENT: 1.  Thyroid  nodules  2.  History of Graves' disease  PLAN: 1.  Thyroid  nodules -Patient with history of 2 thyroid  nodules, of which one subcentimeter nodule in the right thyroid  lobe, not worrisome and 1 larger, in the left thyroid  lobe, previously biopsied with benign results in 06/2018.  This nodule was isoechoic and without microcalcifications, internal blood flow, taller than wide this position, irregular margins.  The nodule appeared to be slightly larger on the ultrasound from 2022, measuring 2 cm, compared to 1.6 cm previously.  This increased to 2.1 cm on the subsequent ultrasound and 2.3 cm on the ultrasound from 05/2023, still meeting criteria for 1 year follow-up.  We checked another ultrasound before this visit, on 05/16/2024 and the nodule measured 2.4 x 1.6 x 1.0 cm, stable, without the need to rebiopsy.  There were subcentimeter nodules: solid and cystic, scattered within the gland with no follow-up needed. - Of note, she had a barium swallow study in the past that did not show external compression of the thyroid  nodule on the esophagus. -  Patient's daughter was diagnosed with papillary thyroid  cancer so now she has family history of thyroid  cancer in 2 members.  She does not have a personal history of radiation therapy to head or neck. - She continues to describe neck pressure and discomfort when laying on the right side.  She also has discomfort when turning head to the left. - At last visit she was worried that with only  expectant management, her neck discomfort will get worse.  We did discuss about the possibility of RFA but I was not aware of this being done here in town.  At today's visit we discussed that this is now offered here.  She is very interested in having this.  We discussed about the procedure in general and possible side effects. Will place a referral for her to see Dr. Peri with interventional radiology. -Patient's blood pressure was elevated today, at 160/100 repeated x 2.  She was advised to follow-up with PCP. -I plan to see her back in a year  2.  History of Graves' disease - In remission - Latest TSH was normal on 05/09/2024 - She still has left hand tremor and sequela of her Graves' disease - She has tachycardia and high blood pressure, which she attributes to whitecoat hypertension.  Usually her blood pressure is improved if we repeat it at the end of the visit - she has no active signs of Graves' ophthalmopathy: No blurry vision, double vision but occasional blurry vision, no eye pain, chemosis  Orders Placed This Encounter  Procedures   Ambulatory referral to Interventional Radiology   Lela Fendt, MD PhD [facility]

## 2024-06-13 NOTE — Patient Instructions (Signed)
 Let's proceed with Radiofrequency ablation.  You should have an endocrinology follow-up appointment in 1 year.

## 2024-06-14 ENCOUNTER — Other Ambulatory Visit: Payer: Self-pay | Admitting: Internal Medicine

## 2024-06-14 DIAGNOSIS — E041 Nontoxic single thyroid nodule: Secondary | ICD-10-CM

## 2024-07-04 ENCOUNTER — Inpatient Hospital Stay: Attending: Oncology

## 2024-07-04 ENCOUNTER — Encounter: Payer: Self-pay | Admitting: Internal Medicine

## 2024-07-04 DIAGNOSIS — D649 Anemia, unspecified: Secondary | ICD-10-CM

## 2024-07-04 LAB — CBC WITH DIFFERENTIAL (CANCER CENTER ONLY)
Abs Immature Granulocytes: 0.02 K/uL (ref 0.00–0.07)
Basophils Absolute: 0 K/uL (ref 0.0–0.1)
Basophils Relative: 0 %
Eosinophils Absolute: 0 K/uL (ref 0.0–0.5)
Eosinophils Relative: 1 %
HCT: 33.8 % — ABNORMAL LOW (ref 36.0–46.0)
Hemoglobin: 11.2 g/dL — ABNORMAL LOW (ref 12.0–15.0)
Immature Granulocytes: 0 %
Lymphocytes Relative: 16 %
Lymphs Abs: 1.1 K/uL (ref 0.7–4.0)
MCH: 30.4 pg (ref 26.0–34.0)
MCHC: 33.1 g/dL (ref 30.0–36.0)
MCV: 91.8 fL (ref 80.0–100.0)
Monocytes Absolute: 0.6 K/uL (ref 0.1–1.0)
Monocytes Relative: 9 %
Neutro Abs: 5 K/uL (ref 1.7–7.7)
Neutrophils Relative %: 74 %
Platelet Count: 319 K/uL (ref 150–400)
RBC: 3.68 MIL/uL — ABNORMAL LOW (ref 3.87–5.11)
RDW: 15.3 % (ref 11.5–15.5)
WBC Count: 6.7 K/uL (ref 4.0–10.5)
nRBC: 0 % (ref 0.0–0.2)

## 2024-07-04 LAB — FERRITIN: Ferritin: 108 ng/mL (ref 11–307)

## 2024-07-04 LAB — IRON AND TIBC
Iron: 103 ug/dL (ref 28–170)
Saturation Ratios: 28 % (ref 10.4–31.8)
TIBC: 375 ug/dL (ref 250–450)
UIBC: 272 ug/dL

## 2024-07-05 NOTE — Telephone Encounter (Signed)
 Do you have any suggestions regarding coverage?

## 2024-07-11 ENCOUNTER — Telehealth: Payer: Self-pay | Admitting: Pharmacist

## 2024-07-11 NOTE — Progress Notes (Cosign Needed)
 Chart Review Reason: Drug information Question - Glp1 insurance coverage  Summary: Letter receive in the mail from [employer] stating that:  GLP Weight loss medications will no longer be covered by this plan starting 06/30/24.   Patient notes she signed up for the savings program through [employer] ([employer] program).   Currently maintained on Wegovy  injections at 1.7 mg weekly dose. Notes she increased from 1.0 mg. Denies prior cf GI side effects with dose titrations. Does not some nausea with overeating or prolonged fasting. Has 3 week of 1.7 mg dose remaining.   Note she is interested in trying the Wegovy  pill formulation in place of the injection given the cash price is cheaper.   Considerations sent to PCP: Patient preference to transition to Wegovy  pill due to cheaper cost.  Recommend Wegovy  oral tablet 4 mg daily x1 month (see below), then increase to 9 mg daily x1 month. If tolerated can discuss last increase to 25 mg daily dose.  Patient aware we cannot prescribe this yet but hope to be able to soon (awaiting update in EHR) _______________________________________________________________ Discussed differences between the subQ and oral semaglutide  versions offered. Similar side effects are expected. Discussed that GI complaints remain the most-reported side effect overall with oral Rybelsus  in our diabetes patients, so something to continue to monitor for.  Discussed average weight loss seen in the clinical trial was surprisingly similar to the injectable though this is specifically at the highest dose of oral Wegovy , 25 mg. If not tolerated, this does not preclude transition back to injections if she decides cost is worth it.   Denies prior c/f side effects with Wegovy  titrations in the past. Discussed starting at one of the the mid-doses as to balance side effects with maintenance of current progress. Lean toward the 4 mg as it appears the formulation is the new R2 version  (previously only avail in [place], formulated for less side effects and better oral absorption). Rybelsus  = R1 in the [place] , R2 in [place]. Dosing conversion between R1 and R2 oral semaglutide : 7 mg R1 > 4 mg R2.      Tina Parker, PharmD, BCACP, CPP Clinical Pharmacist Practitioner [facility] at [facility] Ph: [phone]

## 2024-07-11 NOTE — Telephone Encounter (Signed)
 Thank you for your help. I am ok with changing to oral wegovy . Just let me know what I need to do.

## 2024-07-13 ENCOUNTER — Telehealth: Payer: Self-pay | Admitting: Pharmacist

## 2024-07-13 NOTE — Telephone Encounter (Signed)
 Reviewed. Not able to prescribe at this time. Pt aware.

## 2024-07-13 NOTE — Progress Notes (Signed)
 Brief Telephone Documentation Reason for Call: Wegovy  follow up   Summary of Call: Called patient 1/14 regarding update on Wegovy  tablets (can now be prescribed to her preferred pharmacy).   My chart message sent with full detail. Will attempt second outreach if my chart message not read.   Plan: Oral semaglutide  (Wegovy  tablets) now appear in Epic and can be prescribed.  The cash price does NOT have to be filled exclusively through [facility] pharmacy and can be filled at any retail pharmacy with the coupon on the website.  Messaged patient to confirm preferred pharmacy    Manuelita FABIENE Kobs, PharmD, BCACP, CPP Clinical Pharmacist Practitioner [facility] at [facility] Ph: [phone]

## 2024-07-14 MED ORDER — SEMAGLUTIDE-WEIGHT MANAGEMENT 4 MG PO TABS: 4.0000 mg | ORAL_TABLET | Freq: Every day | ORAL | 0 refills | Status: AC

## 2024-07-14 MED ORDER — SEMAGLUTIDE-WEIGHT MANAGEMENT 9 MG PO TABS: 9.0000 mg | ORAL_TABLET | Freq: Every day | ORAL | 2 refills | Status: AC

## 2024-07-14 NOTE — Addendum Note (Signed)
 Addended by: GERONIMO MANUELITA SAUNDERS on: 07/14/2024 05:14 PM   Modules accepted: Orders

## 2024-07-14 NOTE — Progress Notes (Signed)
 "     Chief Complaint: Patient was seen in consultation today for symptomatic thyroid  nodule  Referring Physician(s): Gherghe,Cristina  History of Present Illness: Tina Parker is a 54 y.o. female with a medical history significant for HTN, migraines, anxiety, rheumatoid arthritis, Graves disease followed by hypothyroidism after childbirth in 1999 and an enlarged left thyroid  nodule first identified in 2020. Her PCP palpated the nodule and a thyroid  ultrasound showed a 1.7 cm left thyroid  nodule. She also has a smaller nodule in the right thyroid . She is familiar to IR from a biopsy of the left nodule January 2025. The pathology report showed benign findings. She has two family members, including her daughter, with a history of papillary thyroid  cancer.   The left nodule has gradually increased in size over the years and now measures 2.4 cm. In the past year or so she began to experience compressive symptoms - neck pressure, neck discomfort when turning her head and dysphagia. The patient met with her endocrinologist 06/13/24 and reported worsening compressive symptoms. The patient is worried her symptoms will become even more severe if the nodule continues to grow. Dr. Vianne discussed radiofrequency ablation as treatment option and the patient was very interested in learning more.   The patient has been kindly referred to Interventional Radiology for possible radiofrequency ablation and she presents to the clinic today for further discussion.   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   Pure hypercholesterolemia    Rheumatoid arthritis(714.0)    positive anti CCP antibodies, positive RF, oligo-articular, MTX   Sciatica    Sleep apnea     Past Surgical History:  Procedure Laterality Date   ARTHRODESIS METATARSALPHALANGEAL JOINT (MTPJ) Right 06/27/2022   Procedure: ARTHRODESIS METATARSALPHALANGEAL JOINT (MTPJ);  Surgeon: Silva Juliene SAUNDERS, DPM;  Location: [facility] ORS;  Service: Podiatry;  Laterality: Right;   CARPAL TUNNEL RELEASE Bilateral    CESAREAN SECTION  1996   COLONOSCOPY  03/14/2002, 08/16/2007, 03/07/2013   [facility]-father   COLONOSCOPY WITH PROPOFOL  N/A 06/21/2018   Procedure: COLONOSCOPY WITH PROPOFOL ;  Surgeon: Viktoria Lamar DASEN, MD;  Location: [facility] ENDOSCOPY;  Service: Endoscopy;  Laterality: N/A;   CRANIOTOMY Left 06/25/2017   Procedure: CRANIOTOMY TEMPORAL LEFT FOR TUMOR RESECTION;  Surgeon: Gillie Duncans, MD;  Location: [facility] OR;  Service: Neurosurgery;  Laterality: Left;   ESOPHAGOGASTRODUODENOSCOPY  03/14/2002, 03/07/2013   ESOPHAGOGASTRODUODENOSCOPY (EGD) WITH PROPOFOL  N/A 06/21/2018   Procedure: ESOPHAGOGASTRODUODENOSCOPY (EGD) WITH PROPOFOL ;  Surgeon: Viktoria Lamar DASEN, MD;  Location: [facility] ENDOSCOPY;  Service: Endoscopy;  Laterality: N/A;   ESOPHAGOGASTRODUODENOSCOPY (EGD) WITH PROPOFOL  N/A 07/15/2019   Procedure: ESOPHAGOGASTRODUODENOSCOPY (EGD) WITH PROPOFOL ;  Surgeon: Therisa Bi, MD;  Location: [facility] ENDOSCOPY;  Service: Gastroenterology;  Laterality: N/A;   FOOT ARTHRODESIS Right 06/27/2022   Procedure: ARTHRODESIS FOOT;  Surgeon: Silva Juliene SAUNDERS, DPM;  Location: [facility] ORS;  Service: Podiatry;  Laterality: Right;   Post Septoplasty and turbinate reduction  2007  TRIGGER FINGER RELEASE      Allergies: Cephalexin, Inh [isoniazid], and Oxycodone   Medications: Prior to Admission medications  Medication Sig Start Date End Date Taking? Authorizing Provider  albuterol  (VENTOLIN  HFA) 108 (90 Base) MCG/ACT  inhaler 2 puffs Inhalation q 4-6 hours prn cough/wheeze; Duration: 90 days    [provider]  amLODipine  (NORVASC ) 5 MG tablet Oral; Duration: 30    [provider]  busPIRone  (BUSPAR ) 7.5 MG tablet Take 1 tablet (7.5 mg total) by mouth 2 (two) times daily. 09/14/23   Glendia Shad, MD  calcium carbonate (OSCAL) 1500 (600 Ca) MG TABS tablet Take 1 tablet by mouth daily.    [provider]  citalopram  (CELEXA ) 40 MG tablet TAKE 1 TABLET(40 MG) BY MOUTH DAILY 09/14/23   Glendia Shad, MD  cyanocobalamin  (VITAMIN B12) 1000 MCG tablet Take 1,000 mcg by mouth daily.    [provider]  folic acid  (FOLVITE ) 1 MG tablet Take 1 mg by mouth daily. 03/10/19   [provider]  hydroxychloroquine (PLAQUENIL) 200 MG tablet Take 200 mg by mouth daily. 02/08/24   [provider]  levocetirizine (XYZAL  ALLERGY 24HR) 5 MG tablet 0.5 tablet in the evening Orally Once a day; Duration: 30 day(s)    [provider]  losartan  (COZAAR ) 100 MG tablet Take 1 tablet (100 mg total) by mouth daily. 05/09/24   Glendia Shad, MD  methotrexate  (RHEUMATREX) 2.5 MG tablet Take 22.5 mg by mouth every Monday. Caution:Chemotherapy. Protect from light. Take 9 tabs po weekly with meals    [provider]  montelukast  (SINGULAIR ) 10 MG tablet 1 tablet Oral once a day; Duration: 30    [provider]  pantoprazole  (PROTONIX ) 40 MG tablet Take 1 tablet (40 mg total) by mouth daily. 05/09/24   Glendia Shad, MD  RINVOQ 15 MG TB24 Take 1 tablet by mouth daily. 08/26/21   [provider]  sulfaSALAzine (AZULFIDINE) 500 MG EC tablet Take 500 mg by mouth daily. 05/05/24   [provider]  valACYclovir  (VALTREX ) 500 MG tablet TAKE 1 TABLET BY MOUTH TWICE DAILY FOR 3 DAYS. CONTINUE 1 BY MOUTH EVERY DAY 12/22/23   Glendia Shad, MD  WEGOVY  1 MG/0.5ML SOAJ SQ injection ADMINISTER 1 MG UNDER THE SKIN 1 TIME A WEEK Patient taking differently: Inject  1.7 mg into the skin once a week. 04/25/24   Glendia Shad, MD     Family History  Problem Relation Age of Onset   Cancer Mother        Breast Cancer   Breast cancer Mother 29   Cancer Father        Colon Cancer   Heart disease Father        Hx of MI   Hypertension Father    Diabetes Father    Hyperlipidemia Father    Healthy Brother    Healthy Brother    Cancer Maternal Grandmother        lung cancer   Heart disease Paternal Grandmother    Stroke Paternal Grandmother    Heart disease Paternal Grandfather    Cancer Maternal Uncle        esophageal   Thyroid  cancer Daughter 25       Beat Thyroid  Cancer Feb/Mar 2025    Social History   Socioeconomic History   Marital status: Married    Spouse name: Keyah Blizard   Number of children: 2   Years of education: Not on file   Highest education level: Not  on file  Occupational History   Occupation: Hairstylist  Tobacco Use   Smoking status: Never   Smokeless tobacco: Never  Vaping Use   Vaping status: Never Used  Substance and Sexual Activity   Alcohol use: Not Currently    Comment: wine cooler 2 drinks 2x a week/ OCCASIONALLY   Drug use: No   Sexual activity: Yes  Other Topics Concern   Not on file  Social History Narrative   Hairdresser    Social Drivers of Health   Tobacco Use: Low Risk (06/13/2024)   Patient History    Smoking Tobacco Use: Never    Smokeless Tobacco Use: Never    Passive Exposure: Not on file  Financial Resource Strain: Low Risk  (09/04/2023)   Received from [facility]   Overall Financial Resource Strain (CARDIA)    Difficulty of Paying Living Expenses: Not hard at all  Food Insecurity: No Food Insecurity (02/08/2024)   Epic    Worried About Radiation Protection Practitioner of Food in the Last Year: Never true    Ran Out of Food in the Last Year: Never true  Transportation Needs: No Transportation Needs (02/08/2024)   Epic    Lack of Transportation (Medical): No    Lack of Transportation  (Non-Medical): No  Physical Activity: Not on file  Stress: Not on file  Social Connections: Not on file  Depression (PHQ2-9): Low Risk (05/09/2024)   Depression (PHQ2-9)    PHQ-2 Score: 0  Alcohol Screen: Not on file  Housing: Low Risk  (05/05/2024)   Received from [facility]   Epic    In the last 12 months, was there a time when you were not able to pay the mortgage or rent on time?: No    In the past 12 months, how many times have you moved where you were living?: 0    At any time in the past 12 months, were you homeless or living in a shelter (including now)?: No  Utilities: Not At Risk (02/08/2024)   Epic    Threatened with loss of utilities: No  Health Literacy: Not on file     Review of Systems: A 12 point ROS discussed and pertinent positives are indicated in the HPI above.  All other systems are negative.  Vital Signs: LMP  (LMP Unknown)   Physical Exam  Imaging:  US  Thyroid  05/16/24      IMPRESSION: 1. Nonenlarged, mildly heterogeneous thyroid  gland. 2. 2.4 cm LEFT mid thyroid  nodule. This nodule has been morphologically stable for least 4 years, and was previously biopsied in 07/21/2023. Assuming a benign pathologic diagnosis, repeat sampling and/or dedicated follow-up is not recommended.   Labs: 07/04/24 CBC WBC Count 6.7 4.2 4.6 5.0 5.6 4.5 5.0  RBC 3.68 Low  3.53 Low  3.51 Low  R 3.59 Low  R 3.71 Low  R 3.68 Low  R 3.64 Low  R  Hemoglobin 11.2 Low  10.9 Low  11.0 Low  11.4 Low  11.8 Low  11.6 Low  11.7 Low   HCT 33.8 Low  32.4 Low  32.6 Low  34.1 Low  34.9 Low  35.0 Low  34.7 Low   MCV 91.8 91.8 92.8 R 95.0 R 94.2 R 95.2 R 95.6 R  MCH 30.4 30.9       MCHC 33.1 33.6 33.8 33.3 33.8 33.2 33.7  RDW 15.3 13.3 13.8 14.4 13.3 13.6 13.7  Platelet Count 319 294 306.0 R 353.0 R 343.0 R 338.0  R 341.0 R    TFTs 05/09/24 Serum TSH 2.17  Serum free T4 n/a Serum T3 n/a Thyroperoxidase Antibody n/a Thyroglobulin Antibody n/a Calcitonin  n/a  Prior Thyroid  FNA: 07/21/23: Left Mid - [place] [facility]   Assessment and Plan:  54 year old female with a history of a dominant left thyroid  nodule which has steadily increased in size over the past few years. The patient complains of compressive symptoms of neck pressure, discomfort and dysphagia.   Thank you for this interesting consult.  I greatly enjoyed meeting [employer] and look forward to participating in their care.  A copy of this report was sent to the requesting provider on this date.  Ester Sides, MD Pager: [phone]    I spent a total of  40 Minutes   in face to face in clinical consultation, greater than 50% of which was counseling/coordinating care for symptomatic thyroid  nodule.  "

## 2024-07-14 NOTE — Telephone Encounter (Signed)
 Thank you for your help with this and for pending the order. I have sent in the prescription and sent Naisha a my chart message.  Thank you again for your help.

## 2024-07-15 ENCOUNTER — Other Ambulatory Visit: Payer: Self-pay | Admitting: Interventional Radiology

## 2024-07-15 ENCOUNTER — Ambulatory Visit
Admission: RE | Admit: 2024-07-15 | Discharge: 2024-07-15 | Disposition: A | Discharge status: Home / Self Care | Source: Ambulatory Visit | Attending: Internal Medicine | Admitting: Internal Medicine

## 2024-07-15 DIAGNOSIS — E041 Nontoxic single thyroid nodule: Secondary | ICD-10-CM

## 2024-07-15 HISTORY — PX: IR RADIOLOGIST EVAL & MGMT: IMG5224

## 2024-07-21 ENCOUNTER — Ambulatory Visit
Admission: RE | Admit: 2024-07-21 | Discharge: 2024-07-21 | Disposition: A | Discharge status: Home / Self Care | Source: Ambulatory Visit | Attending: Interventional Radiology | Admitting: Interventional Radiology

## 2024-07-21 DIAGNOSIS — E041 Nontoxic single thyroid nodule: Secondary | ICD-10-CM

## 2024-07-23 LAB — CYTOLOGY - NON PAP

## 2024-07-25 ENCOUNTER — Other Ambulatory Visit

## 2024-07-27 ENCOUNTER — Other Ambulatory Visit (HOSPITAL_COMMUNITY): Payer: Self-pay

## 2024-07-30 ENCOUNTER — Other Ambulatory Visit: Payer: Self-pay | Admitting: Internal Medicine

## 2024-08-01 ENCOUNTER — Ambulatory Visit: Admitting: Podiatry

## 2024-08-01 ENCOUNTER — Ambulatory Visit

## 2024-08-02 ENCOUNTER — Telehealth: Payer: Self-pay

## 2024-08-02 ENCOUNTER — Other Ambulatory Visit (HOSPITAL_COMMUNITY): Payer: Self-pay

## 2024-08-03 ENCOUNTER — Other Ambulatory Visit: Payer: Self-pay | Admitting: Internal Medicine

## 2024-08-03 ENCOUNTER — Encounter: Payer: Self-pay | Admitting: Internal Medicine

## 2024-08-03 DIAGNOSIS — E042 Nontoxic multinodular goiter: Secondary | ICD-10-CM

## 2024-08-03 NOTE — Telephone Encounter (Signed)
 Pharmacy Patient Advocate Encounter  Received notification from [facility] that Prior Authorization for Wegovy  tablet has been DENIED.  See denial reason below. No denial letter attached in CMM. Will attach denial letter to Media tab once received.   PA #/Case ID/Reference #: [id number]

## 2024-08-03 NOTE — Telephone Encounter (Signed)
 Ok.

## 2024-08-05 ENCOUNTER — Inpatient Hospital Stay: Admission: RE | Admit: 2024-08-05 | Source: Ambulatory Visit

## 2024-08-09 ENCOUNTER — Ambulatory Visit

## 2024-08-26 ENCOUNTER — Ambulatory Visit: Admitting: Internal Medicine

## 2024-09-05 ENCOUNTER — Ambulatory Visit: Admitting: Sleep Medicine

## 2024-09-05 ENCOUNTER — Other Ambulatory Visit

## 2024-09-12 ENCOUNTER — Ambulatory Visit: Admitting: Internal Medicine

## 2024-11-01 ENCOUNTER — Other Ambulatory Visit

## 2024-11-01 ENCOUNTER — Ambulatory Visit: Admitting: Oncology

## 2025-06-19 ENCOUNTER — Ambulatory Visit: Admitting: Internal Medicine
